# Patient Record
Sex: Male | Born: 1980
Health system: Southern US, Community
[De-identification: ages and names within clinical notes are randomized; demographics above are authoritative.]

## PROBLEM LIST (undated history)

## (undated) ENCOUNTER — Emergency Department (HOSPITAL_COMMUNITY): Admission: EM | Payer: Self-pay

## (undated) DIAGNOSIS — F209 Schizophrenia, unspecified: Secondary | ICD-10-CM

## (undated) HISTORY — PX: APPENDECTOMY: SHX54

---
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
## 2002-02-03 ENCOUNTER — Emergency Department (HOSPITAL_COMMUNITY): Admission: EM | Admit: 2002-02-03 | Discharge: 2002-02-04 | Payer: Self-pay | Admitting: Emergency Medicine

## 2002-12-17 ENCOUNTER — Emergency Department (HOSPITAL_COMMUNITY): Admission: EM | Admit: 2002-12-17 | Discharge: 2002-12-17 | Payer: Self-pay | Admitting: Emergency Medicine

## 2004-01-19 ENCOUNTER — Emergency Department (HOSPITAL_COMMUNITY): Admission: EM | Admit: 2004-01-19 | Discharge: 2004-01-19 | Payer: Self-pay | Admitting: Family Medicine

## 2004-01-25 ENCOUNTER — Emergency Department (HOSPITAL_COMMUNITY): Admission: EM | Admit: 2004-01-25 | Discharge: 2004-01-26 | Payer: Self-pay | Admitting: Emergency Medicine

## 2006-11-03 ENCOUNTER — Inpatient Hospital Stay (HOSPITAL_COMMUNITY): Admission: EM | Admit: 2006-11-03 | Discharge: 2006-11-04 | Payer: Self-pay | Admitting: Emergency Medicine

## 2006-11-03 ENCOUNTER — Encounter (INDEPENDENT_AMBULATORY_CARE_PROVIDER_SITE_OTHER): Payer: Self-pay | Admitting: Specialist

## 2010-01-30 ENCOUNTER — Emergency Department (HOSPITAL_BASED_OUTPATIENT_CLINIC_OR_DEPARTMENT_OTHER): Admission: EM | Admit: 2010-01-30 | Discharge: 2010-01-30 | Payer: Self-pay | Admitting: Emergency Medicine

## 2010-11-22 LAB — BASIC METABOLIC PANEL
BUN: 7 mg/dL (ref 6–23)
Calcium: 9.6 mg/dL (ref 8.4–10.5)
Chloride: 105 mEq/L (ref 96–112)
GFR calc Af Amer: >60 mL/min (ref >60)
Potassium: 4.4 mEq/L (ref 3.5–5.1)
Sodium: 145 mEq/L (ref 135–145)

## 2010-11-22 LAB — URINALYSIS, ROUTINE W REFLEX MICROSCOPIC
Bilirubin Urine: NEGATIVE
Glucose, UA: NEGATIVE mg/dL
Ketones, ur: NEGATIVE mg/dL
Nitrite: NEGATIVE
Specific Gravity, Urine: 1.01 (ref 1.005–1.030)
pH: 6.5 (ref 5.0–8.0)

## 2010-11-22 LAB — DIFFERENTIAL
Basophils Absolute: 0.2 10*3/uL — ABNORMAL HIGH (ref 0.0–0.1)
Eosinophils Absolute: 0.4 10*3/uL (ref 0.0–0.7)
Lymphocytes Relative: 16 % (ref 12–46)
Lymphs Abs: 1.4 10*3/uL (ref 0.7–4.0)
Monocytes Absolute: 0.5 10*3/uL (ref 0.1–1.0)
Monocytes Relative: 6 % (ref 3–12)
Neutro Abs: 6.6 10*3/uL (ref 1.7–7.7)

## 2010-11-22 LAB — POCT TOXICOLOGY PANEL

## 2010-11-22 LAB — CBC: RBC: 5.54 MIL/uL (ref 4.22–5.81)

## 2011-01-21 NOTE — Op Note (Signed)
William Romero, William Romero               ACCOUNT NO.:  [id number]   MEDICAL RECORD NO.:  [id number]          PATIENT TYPE:  INP   LOCATION:  0098                         FACILITY[facility]  [facility]   PHYSICIAN:  Velora Heckler, MD      DATE OF BIRTH:  1981-05-24   DATE OF PROCEDURE:  11/03/2006  DATE OF DISCHARGE:                               OPERATIVE REPORT   PREOPERATIVE DIAGNOSIS:  Acute appendicitis.   POSTOPERATIVE DIAGNOSIS:  Acute appendicitis.   PROCEDURE:  Laparoscopic appendectomy.   SURGEON:  Velora Heckler, MD, FACS   ANESTHESIA:  General per Ronelle Nigh, MD   ESTIMATED BLOOD LOSS:  Minimal.   PREPARATION:  Betadine.   COMPLICATIONS:  None.   INDICATIONS:  The patient is a 30 year old white male who presented to  the emergency department with less than 24-hour history of abdominal  pain localized to the right lower quadrant; this was associated with  chills, nausea and vomiting.  In the emergency department, laboratory  studies showed an elevated white count of 15,000 with a left shift.  CT  scan of abdomen and pelvis was obtained which showed findings consistent  with acute appendicitis.  General Surgery was called and the patient was  seen and prepared for the operating room.   BODY OF REPORT:  Procedure is done in OR #1 at the [facility].  The patient is brought to the operating room and placed in a  supine position on the operating room table.  Following administration  of general anesthesia, the patient is prepped and draped in usual strict  aseptic fashion.  After ascertaining that an adequate level of  anesthesia had been obtained, an infraumbilical incision is made #15  blade.  Dissection is carried through subcutaneous tissues.  Fascia is  incised in the midline and the peritoneal cavity is entered cautiously.  A 0 Vicryl pursestring suture is placed in the fascia.  An Hasson  cannula is introduced and secured with a pursestring suture.   Abdomen  was insufflated carbon dioxide.  Laparoscope is introduced and the  abdomen explored.  Operative ports are placed in the right upper  quadrant and left lower quadrant.  Cecum is mobilized.  The patient is  placed in Trendelenburg.  Appendix extends over the pelvic brim and is  adherent to the pelvic sidewall.  There is a small amount of fluid in  the pelvis.  There is no sign of perforation.  Base of the appendix is  visible.  A window is made at the base of the appendix.  An Endo-GIA  stapler is then used to transect the base of the appendix at its  junction with the cecal wall.  The mesoappendix is then taken down with  the harmonic scalpel used for hemostasis.  The mesoappendix is  completely divided.  Appendix is placed into an EndoCatch bag and  withdrawn through the left lower quadrant port.  Right lower quadrant is  irrigated copiously with warm saline, as is the pelvis.  Fluid is  evacuated.  Good hemostasis is noted.  No purulence is  noted.  Ports are  removed under direct vision.  Good hemostasis is noted at all port  sites.  Pneumoperitoneum is released.  The 0 Vicryl pursestring suture  is tied securely.  Port sites are anesthetized with local anesthetic.  All wounds are closed with interrupted 4-0 Vicryl subcuticular  sutures.  Wounds are washed and dried and Steri-Strips are applied.  Sterile dressings are applied.  The patient is awakened from anesthesia  and brought to the recovery room in stable condition.  The patient  tolerated the procedure well.      Velora Heckler, MD  Electronically Signed     TMG/MEDQ  D:  11/03/2006  T:  11/03/2006  Job:  [id number]   cc:   Erskine Speed, M.D.  Fax: [phone]   Celene Kras, MD  Fax: [phone]

## 2011-01-21 NOTE — H&P (Signed)
William Romero, William Romero               ACCOUNT NO.:  [id number]   MEDICAL RECORD NO.:  [id number]          PATIENT TYPE:  INP   LOCATION:  0098                         FACILITY[facility]   PHYSICIAN:  Velora Heckler, MD      DATE OF BIRTH:  1980/09/27   DATE OF ADMISSION:  11/02/2006  DATE OF DISCHARGE:                              HISTORY & PHYSICAL   CHIEF COMPLAINT:  Abdominal pain, acute appendicitis.   PRIMARY CARE PHYSICIAN:  Dr. Nila Nephew   HISTORY OF PRESENT ILLNESS:  The patient is 30 year old white male  presents to the emergency department with less than 24-hour history of  abdominal pain.  The patient first noted pain in the midline just below  the umbilicus.  This gradually localized to the right lower quadrant.  It was associated with chills.  He developed nausea and has had emesis  on about six occasions.  The patient presented to the emergency  department on the afternoon of February28.  He was seen and evaluated by  the emergency room physician.  Laboratory studies showed an elevated  white blood cell count of 15.1 and the differential showed 98%  neutrophils.  CT scan abdomen and pelvis was obtained and showed  findings consistent with acute appendicitis.  General surgery was  consulted and the patient was prepared for the operating room.   PAST MEDICAL HISTORY:  History of ADD.   MEDICATIONS:  1. Adderall XR  2. Xanax p.r.n.   ALLERGIES:  None known.   SOCIAL HISTORY:  The patient works and Biomedical engineer.  He has also  worked previously as an Journalist, newspaper.  He is accompanied by both  parents.  He smokes.  He denies drug use.  He drinks alcohol 2-3 times  weekly.   FAMILY HISTORY:  Noncontributory.   REVIEW OF SYSTEMS:  A 15 system review without significant other  finding.   PHYSICAL EXAMINATION:  GENERAL:  A44 year old well-developed, well-  nourished white male in no acute distress.  VITAL SIGNS:  Temperature 98.7, pulse 98, respirations 16, O2 sat  97%  room air.  HEENT: Shows him to be normocephalic, atraumatic.  Sclerae clear.  Conjunctivae are clear.  Pupils equal and reactive.  Dentition good.  Mucous membranes moist.  Voice normal.  NECK:  Palpation of the neck shows no lymphadenopathy.  No thyroid  nodularity.  LUNGS: Clear to auscultation bilaterally without rales, rhonchi or  wheeze.  CARDIAC:  Exam shows regular rate and rhythm without murmur.  Peripheral  pulses are full.  ABDOMEN:  Soft without distension.  On auscultation there are rare bowel  sounds.  There is tenderness to percussion and palpation in the right  lower quadrant.  There is voluntary guarding.  Rebound is not tested.  EXTREMITIES:  Nontender without edema.  NEUROLOGICAL:  The patient is alert and oriented without focal deficit.   LABORATORY STUDIES:  White count 15.1, hemoglobin 15.9, hematocrit  45.4%, platelet count 280,000.  Differential shows 90% neutrophils 4%  lymphocytes.  Electrolytes are normal.  Liver function tests are normal.   RADIOGRAPHIC STUDIES:  CT scan  abdomen and pelvis demonstrated findings  consistent with acute appendicitis.   IMPRESSION:  Acute appendicitis.   PLAN:  Antibiotics were initiated in the emergency department.  The  patient was seen and evaluated in the emergency department and surgery  was discussed with the patient and with his parents.  The patient will  be prepared taken directly to the operating room for laparoscopic  appendectomy.  I explained to them there is approximately a 90% chance  of success with the scope at approximately 10% chance of conversion to  open surgery.  We discussed the postoperative hospitalization to be  expected and his recovery at home.  They understand and wish to proceed.  Velora Heckler, MD  Electronically Signed     TMG/MEDQ  D:  11/03/2006  T:  11/03/2006  Job:  [id number]   cc:   Celene Kras, MD  Fax: [phone]   Erskine Speed, M.D.  Fax: [phone]

## 2013-03-16 LAB — CBC
MCHC: 34.4 g/dL (ref 32.0–36.0)
MCV: 87 fL (ref 80–100)
Platelet: 345 10*3/uL (ref 150–440)
RDW: 14 % (ref 11.5–14.5)

## 2013-03-17 LAB — COMPREHENSIVE METABOLIC PANEL
Bilirubin,Total: 0.7 mg/dL (ref 0.2–1.0)
Chloride: 106 mmol/L (ref 98–107)
Creatinine: 2.12 mg/dL — ABNORMAL HIGH (ref 0.60–1.30)
EGFR (Non-African Amer.): 40 — ABNORMAL LOW
Glucose: 204 mg/dL — ABNORMAL HIGH (ref 65–99)
SGOT(AST): 23 U/L (ref 15–37)
Sodium: 139 mmol/L (ref 136–145)
Total Protein: 8.7 g/dL — ABNORMAL HIGH (ref 6.4–8.2)

## 2013-03-17 LAB — URINALYSIS, COMPLETE
Blood: NEGATIVE
Specific Gravity: 1.028 (ref 1.003–1.030)
Squamous Epithelial: NONE SEEN

## 2013-03-17 LAB — DRUG SCREEN, URINE
Barbiturates, Ur Screen: NEGATIVE (ref ?–200)
Benzodiazepine, Ur Scrn: NEGATIVE (ref ?–200)
MDMA (Ecstasy)Ur Screen: POSITIVE (ref ?–500)
Methadone, Ur Screen: NEGATIVE (ref ?–300)
Opiate, Ur Screen: NEGATIVE (ref ?–300)
Phencyclidine (PCP) Ur S: NEGATIVE (ref ?–25)

## 2013-03-17 LAB — ETHANOL
Ethanol %: <0.003 % (ref 0.000–0.080)
Ethanol: <3 mg/dL

## 2013-03-18 LAB — CBC WITH DIFFERENTIAL/PLATELET
Basophil %: 0.8 %
Eosinophil #: 0.3 10*3/uL (ref 0.0–0.7)
Lymphocyte #: 2.2 10*3/uL (ref 1.0–3.6)
MCH: 30.2 pg (ref 26.0–34.0)
Monocyte #: 1 x10 3/mm (ref 0.2–1.0)
Monocyte %: 7.1 %
Neutrophil #: 10 10*3/uL — ABNORMAL HIGH (ref 1.4–6.5)
Neutrophil %: 73.8 %
Platelet: 223 10*3/uL (ref 150–440)

## 2013-03-18 LAB — BASIC METABOLIC PANEL
Anion Gap: 5 — ABNORMAL LOW (ref 7–16)
BUN: 9 mg/dL (ref 7–18)
Calcium, Total: 8.4 mg/dL — ABNORMAL LOW (ref 8.5–10.1)
Chloride: 109 mmol/L — ABNORMAL HIGH (ref 98–107)
EGFR (Non-African Amer.): >60
Potassium: 3.4 mmol/L — ABNORMAL LOW (ref 3.5–5.1)

## 2013-03-19 ENCOUNTER — Inpatient Hospital Stay: Payer: Self-pay | Admitting: Psychiatry

## 2013-03-19 LAB — GC/CHLAMYDIA PROBE AMP

## 2013-03-20 LAB — BEHAVIORAL MEDICINE 1 PANEL
Albumin: 3.1 g/dL — ABNORMAL LOW (ref 3.4–5.0)
Alkaline Phosphatase: 78 U/L (ref 50–136)
Anion Gap: 6 — ABNORMAL LOW (ref 7–16)
BUN: 8 mg/dL (ref 7–18)
Basophil #: 0.1 10*3/uL (ref 0.0–0.1)
Basophil %: 0.9 %
Bilirubin,Total: 0.3 mg/dL (ref 0.2–1.0)
Calcium, Total: 8.5 mg/dL (ref 8.5–10.1)
Chloride: 110 mmol/L — ABNORMAL HIGH (ref 98–107)
Co2: 27 mmol/L (ref 21–32)
Creatinine: 0.9 mg/dL (ref 0.60–1.30)
EGFR (African American): >60
EGFR (Non-African Amer.): >60
Eosinophil #: 0.3 10*3/uL (ref 0.0–0.7)
Eosinophil %: 3.7 %
Glucose: 92 mg/dL (ref 65–99)
HCT: 39.4 % — ABNORMAL LOW (ref 40.0–52.0)
HGB: 13.8 g/dL (ref 13.0–18.0)
Lymphocyte #: 1.7 10*3/uL (ref 1.0–3.6)
Lymphocyte %: 21.9 %
MCH: 30.3 pg (ref 26.0–34.0)
MCHC: 35 g/dL (ref 32.0–36.0)
MCV: 87 fL (ref 80–100)
Monocyte #: 0.4 x10 3/mm (ref 0.2–1.0)
Monocyte %: 5 %
Neutrophil #: 5.2 10*3/uL (ref 1.4–6.5)
Neutrophil %: 68.5 %
Osmolality: 283 (ref 275–301)
Platelet: 243 10*3/uL (ref 150–440)
Potassium: 3.6 mmol/L (ref 3.5–5.1)
RBC: 4.55 10*6/uL (ref 4.40–5.90)
RDW: 13.7 % (ref 11.5–14.5)
SGOT(AST): 25 U/L (ref 15–37)
SGPT (ALT): 24 U/L (ref 12–78)
Sodium: 143 mmol/L (ref 136–145)
Thyroid Stimulating Horm: 0.841 u[IU]/mL
Total Protein: 6.3 g/dL — ABNORMAL LOW (ref 6.4–8.2)
WBC: 7.6 10*3/uL (ref 3.8–10.6)

## 2013-07-29 ENCOUNTER — Ambulatory Visit: Payer: Self-pay

## 2013-07-29 ENCOUNTER — Other Ambulatory Visit: Payer: Self-pay | Admitting: Occupational Medicine

## 2013-07-29 DIAGNOSIS — R52 Pain, unspecified: Secondary | ICD-10-CM

## 2013-09-25 ENCOUNTER — Emergency Department (HOSPITAL_COMMUNITY)
Admission: EM | Admit: 2013-09-25 | Discharge: 2013-09-25 | Disposition: A | Discharge status: Home / Self Care | Payer: Self-pay | Attending: Emergency Medicine | Admitting: Emergency Medicine

## 2013-09-25 ENCOUNTER — Encounter (HOSPITAL_COMMUNITY): Payer: Self-pay | Admitting: Emergency Medicine

## 2013-09-25 DIAGNOSIS — Z79899 Other long term (current) drug therapy: Secondary | ICD-10-CM | POA: Insufficient documentation

## 2013-09-25 DIAGNOSIS — R5381 Other malaise: Secondary | ICD-10-CM | POA: Insufficient documentation

## 2013-09-25 DIAGNOSIS — F17200 Nicotine dependence, unspecified, uncomplicated: Secondary | ICD-10-CM | POA: Insufficient documentation

## 2013-09-25 DIAGNOSIS — R5383 Other fatigue: Secondary | ICD-10-CM

## 2013-09-25 DIAGNOSIS — Z9089 Acquired absence of other organs: Secondary | ICD-10-CM | POA: Insufficient documentation

## 2013-09-25 DIAGNOSIS — K5900 Constipation, unspecified: Secondary | ICD-10-CM | POA: Insufficient documentation

## 2013-09-25 LAB — POCT I-STAT, CHEM 8
BUN: 4 mg/dL — AB (ref 6–23)
CHLORIDE: 101 meq/L (ref 96–112)
CREATININE: 0.9 mg/dL (ref 0.50–1.35)
Calcium, Ion: 1.23 mmol/L (ref 1.12–1.23)
Glucose, Bld: 105 mg/dL — ABNORMAL HIGH (ref 70–99)
HEMATOCRIT: 48 % (ref 39.0–52.0)
HEMOGLOBIN: 16.3 g/dL (ref 13.0–17.0)
Potassium: 3.9 mEq/L (ref 3.7–5.3)
SODIUM: 142 meq/L (ref 137–147)
TCO2: 27 mmol/L (ref 0–100)

## 2013-09-25 LAB — CBC WITH DIFFERENTIAL/PLATELET
BASOS PCT: 2 % — AB (ref 0–1)
Basophils Absolute: 0.1 10*3/uL (ref 0.0–0.1)
EOS ABS: 0.3 10*3/uL (ref 0.0–0.7)
Eosinophils Relative: 7 % — ABNORMAL HIGH (ref 0–5)
HCT: 44.4 % (ref 39.0–52.0)
HEMOGLOBIN: 15.4 g/dL (ref 13.0–17.0)
LYMPHS PCT: 32 % (ref 12–46)
Lymphs Abs: 1.4 10*3/uL (ref 0.7–4.0)
MCH: 31 pg (ref 26.0–34.0)
MCHC: 34.7 g/dL (ref 30.0–36.0)
MCV: 89.3 fL (ref 78.0–100.0)
MONOS PCT: 16 % — AB (ref 3–12)
Monocytes Absolute: 0.7 10*3/uL (ref 0.1–1.0)
NEUTROS ABS: 2 10*3/uL (ref 1.7–7.7)
NEUTROS PCT: 44 % (ref 43–77)
PLATELETS: 271 10*3/uL (ref 150–400)
RBC: 4.97 MIL/uL (ref 4.22–5.81)
RDW: 13.3 % (ref 11.5–15.5)
WBC: 4.5 10*3/uL (ref 4.0–10.5)

## 2013-09-25 NOTE — ED Notes (Addendum)
Pt states that he feels tired.  States that he sleeps ok but when he wakes up he does not feel rested.  Also states that he eats a lot but he doesn't poop a lot.  States his BMs are soft.  States that he is worried that he is not pooping every day.  Poops 3 times a week.  Denies hard stools.  These problems have been going on longer than 1 month.

## 2013-09-25 NOTE — ED Notes (Signed)
Pt escorted to discharge window. Pt verbalized understanding discharge instructions. In no acute distress.  

## 2013-09-25 NOTE — Discharge Instructions (Signed)
Follow up with a doctor from the resource guide for further evaluation of your symptoms. Refer to attached documents for more information.    Emergency Department Resource Guide 1) Find a Doctor and Pay Out of Pocket Although you won't have to find out who is covered by your insurance plan, it is a good idea to ask around and get recommendations. You will then need to call the office and see if the doctor you have chosen will accept you as a new patient and what types of options they offer for patients who are self-pay. Some doctors offer discounts or will set up payment plans for their patients who do not have insurance, but you will need to ask so you aren't surprised when you get to your appointment.  2) Contact Your Local Health Department Not all health departments have doctors that can see patients for sick visits, but many do, so it is worth a call to see if yours does. If you don't know where your local health department is, you can check in your phone book. The CDC also has a tool to help you locate your state's health department, and many state websites also have listings of all of their local health departments.  3) Find a Walk-in Clinic If your illness is not likely to be very severe or complicated, you may want to try a walk in clinic. These are popping up all over the country in pharmacies, drugstores, and shopping centers. They're usually staffed by nurse practitioners or physician assistants that have been trained to treat common illnesses and complaints. They're usually fairly quick and inexpensive. However, if you have serious medical issues or chronic medical problems, these are probably not your best option.  No Primary Care Doctor: - Call Health Connect at  [phone] - they can help you locate a primary care doctor that  accepts your insurance, provides certain services, etc. - Physician Referral Service- [phone]  Chronic Pain Problems: Organization          Address  Phone   Notes  [facility]  [phone]) [phone] Patients need to be referred by their primary care doctor.   Medication Assistance: Organization         Address  Phone   Notes  [facility] Medication [facility] Anssistance Program [address] [place], [place] [address] [phone] --Must be a resident of [place] -- Must have NO insurance coverage whatsoever (no Medicaid/ Medicare, etc.) -- The pt. MUST have a primary care doctor that directs their care regularly and follows them in the community   MedAssist  [phone]   [employer]  [phone]    Agencies that provide inexpensive medical care: Organization         Address  Phone   Notes  [facility]  [phone]) [phone]   [facility]    [phone]   [facility] [address] [place], [place] [address] [phone]   [facility] [place] [address], [place] [phone](336) [phone]   [facility]    [phone](336) [phone]   [facility]    [phone]   [facility]  [address], [place] Phone:  [phone]) [phone], Fax:  [phone] Hours of Operation:  9 am - 6 pm, M-F.  Also accepts Medicaid/Medicare and self-pay.  [facility]  [address], [place] Phone: [phone], Fax: [phone]. Hours of Operation:  8:30 am - 5:30 pm, M-F.  Also accepts Medicaid and self-pay.  [facility] [place] [address], [place] Phone: [phone]   [place], [place], [place] [phone], Ext. [phone] Mondays & Thursdays: 7-9 AM.  First 15 patients are seen on a first come, first serve basis.    [place] Providers:  Organization         Address  Phone   Notes  [facility] [address], [place] [phone] Also accepts self-pay patients.  [facility] [address], [place]  [phone]   [place], [address], [place] [phone]   [facility] [address] [place] [phone]   Lucianne Lei [address], [place]   [phone] Only accepts [place] Access [place] patients after they have their name applied to their card.   Self-Pay (no insurance) in [facility]:  Organization         Address  Phone   Notes  Sickle Cell Patients, [facility] [place] [phone]   [facility] [place] [phone]   [facility] [place]  [place], [place], [place] [phone]   [facility]/Dr. Osei-Bonsu  [address] [place] or [address], [place] [phone] Phone number for both [place] and [place] locations is the same.  [facility] [address] [place] [phone]   [facility] [address], [place] or [address] [phone]   [facility] [address] [place] [phone], phone; [phone], fax Sees patients 1st and 3rd Saturday of every month.  Must not qualify for public or private insurance (i.e. Medicaid, Medicare, [place] Health Choice, Veterans' Benefits)  Household income should be no more than 200% of the poverty level The clinic cannot treat you if you are pregnant or think you are pregnant  Sexually transmitted diseases are not treated at the clinic.    Dental Care: Organization         Address  Phone  Notes  [facility] [place] [phone] Accepts children up to age 23 who are enrolled in [place] or [place]; pregnant women with a Medicaid card; and  children who have applied for Medicaid or [place] Health Choice, but were declined, whose parents can pay a reduced fee at time of service.  [facility] of [facility]  [address], [place] [phone] Accepts children up to age 22 who are enrolled in [place] or [place]; pregnant women with a Medicaid card; and children who have applied for Medicaid or [place] Health Choice, but were declined, whose parents can pay a reduced fee at time of service.  [facility]  [place] [phone] Patients are seen by appointment only. Walk-ins are not accepted. [place] will see patients 70 years of age and older. Monday - Tuesday (8am-5pm) Most Wednesdays (8:30-5pm) $30 per visit, cash only  [facility]  [address], [employer] [place] [phone] Patients are seen by appointment only. Walk-ins are not accepted. [facility] will see patients 18 years of age and older. °One Wednesday Evening (Monthly: Volunteer Based).  $30 per visit, cash only  °[facility]  [phone] for adults; Children under age 4, call Graduate Pediatric Dentistry at [phone]. Children aged 4-14, please call [phone] to request a pediatric application. ° Dental services are provided in all areas of dental care including fillings, crowns and bridges, complete and partial dentures, implants, gum treatment, root canals, and extractions. Preventive care is also provided. Treatment is provided to both adults and children. °Patients are selected via a lottery and there is often a waiting list. °  °[facility] [address], °[place] ° ([phone] www.drcivils.com °  °[facility] [address], [place], [place] [phone], Ext. [phone] Second and Fourth Thursday of each month, opens at 6:30 AM; Clinic ends at 9 AM.  Patients are seen on a first-come first-served  basis, and a limited number are seen during each clinic.  ° °[facility] ° [address], [place], [place] [phone]   Eligibility Requirements °You must have lived in [place], [place], or [place] counties for at least the last three months. °  You cannot be eligible for state or federal sponsored healthcare insurance, including Veterans Administration, Medicaid, or Medicare. °  You generally cannot be eligible for healthcare insurance through your employer.  °  How to apply: °Eligibility screenings are held every Tuesday and Wednesday afternoon from 1:00 pm until 4:00 pm. You do not need an appointment for the interview!  °[facility] [address], [place], [place] [phone]   °[facility]  [phone]   °[place]  [phone]   °[facility]  336-570-6415   ° °Behavioral Health Resources in the Community: °Intensive Outpatient Programs °Organization         Address  Phone  Notes  °[facility] [address], [place], [place] [phone]   °[facility] [address], [place], [place] [phone]   °[facility] [address], [place], [place] ° 336-882-2125   °[facility] [address],  °[place], [place] 1-800-853-5163 or 336-641-4981   °Substance Abuse Resources °Organization         Address  Phone  Notes  °[facility]  336-882-2125   °[facility]  336-784-9470   °[facility]  336-285-9073   °[facility]  336-845-3988   °Residential & Outpatient Substance Abuse Program  1-800-659-3381   °Psychological Services °Organization         Address  Phone  Notes  °[facility]  336- 832-9600   °[facility]  336- 378-7881   °[facility] [address], [place] 1-800-853-5163 or 336-641-4981   ° °Mobile Crisis Teams °Organization          Address  Phone  Notes  °[facility], Mobile Crisis Care Unit  1-877-626-1772   °Assertive °Psychotherapeutic Services ° [address] [place], [place] 336-834-9664   °Sharon DeEsch [address] °[place] 336-554-5454   ° °Self-Help/Support Groups °Organization         Address  Phone             Notes  °[facility] [place] - variety of support groups    336- H3156881 Call for more information  Narcotics Anonymous (NA), [facility] [address], [employer] [place]  2 meetings at this location   Residential Facilities manager         Address  Phone  Notes  [facility] [place],    [place]  1-681-042-6357   [facility]  [address], [place] [id number], [place], [place]   [place], [place] [phone] Admissions: 8am-3pm M-F  Incentives Substance [place] [address]    [employer], [place] [phone]   [facility] [address] [place], [place], [place]   [facility] [address],  [place], [place]   [facility] - Intensive Outpatient [address], [place], [place]   [facility] ([place].,  [place], [place] 1-(336)590-7905 or [phone]   [facility] ([facility]) [address], [place], [place] Accepts Medicaid  [facility] [address]  [place] 1-(661)341-8339 Substance Abuse/Addiction Treatment   [facility] Organization         Address  Phone  Notes  [facility]  512-005-4881   Domenic Schwab, PhD [address] Arlis [place], [place]   [phone] or [phone]   [place], [place] [phone]   [facility] [address], [place], [place] [phone] Insurance/Medicaid/sponsorship  through [facility] [address]                                    [place], [place] [phone] [place] [address], [place] [phone]    Dr. Adele Schilder  [phone]   [facility] Dept. 1) [address] [place] 2) [place] 3)  [place], [place] [phone]  [phone]   [place] [phone] or [phone] (After Hours)

## 2013-09-25 NOTE — ED Provider Notes (Signed)
CSN: [id number]     Arrival date & time 09/25/13  1113 History   First MD Initiated Contact with Patient 09/25/13 1127     Chief Complaint  Patient presents with  . Fatigue   (Consider location/radiation/quality/duration/timing/severity/associated sxs/prior Treatment) HPI Comments: Patient is a 33 year old male with no significant past medical history who presents with fatigue for the past month. Symptoms started gradually and progressively worsened since the onset. Patient denies any other specific symptoms or changes in habits. He reports sleeping the same amount as he was previously but does not feel rested when he wakes up in the morning. Patient reports some associated constipation, as he has a bowel movement about 3 times per week. This has changed from a daily bowel movement about a month ago. Patient has no PCP bur does see Dr. Carroll Sage, Psychiatrist, who prescribes Vyvanse. Patient has not tried anything for symptoms improvement. Patient denies any changes in mood or loss of interest in activities.    History reviewed. No pertinent past medical history. Past Surgical History  Procedure Laterality Date  . Appendectomy     History reviewed. No pertinent family history. History  Substance Use Topics  . Smoking status: Current Every Day Smoker -- 0.50 packs/day    Types: Cigarettes  . Smokeless tobacco: Not on file  . Alcohol Use: No    Review of Systems  Constitutional: Positive for fatigue. Negative for fever and chills.  HENT: Negative for trouble swallowing.   Eyes: Negative for visual disturbance.  Respiratory: Negative for shortness of breath.   Cardiovascular: Negative for chest pain and palpitations.  Gastrointestinal: Positive for constipation. Negative for nausea, vomiting, abdominal pain and diarrhea.  Genitourinary: Negative for dysuria and difficulty urinating.  Musculoskeletal: Negative for arthralgias and neck pain.  Skin: Negative for color change.   Neurological: Negative for dizziness and weakness.  Psychiatric/Behavioral: Negative for dysphoric mood.    Allergies  Review of patient's allergies indicates no known allergies.  Home Medications   Current Outpatient Rx  Name  Route  Sig  Dispense  Refill  . lisdexamfetamine (VYVANSE) 30 MG capsule   Oral   Take 30 mg by mouth daily.          BP 125/78  Pulse 91  Temp(Src) 98.2 F (36.8 C) (Oral)  Resp 16  SpO2 100% Physical Exam  Nursing note and vitals reviewed. Constitutional: He is oriented to person, place, and time. He appears well-developed and well-nourished. No distress.  HENT:  Head: Normocephalic and atraumatic.  Mouth/Throat: Oropharynx is clear and moist. No oropharyngeal exudate.  Eyes: Conjunctivae and EOM are normal.  Neck: Normal range of motion.  Cardiovascular: Normal rate and regular rhythm.  Exam reveals no gallop and no friction rub.   No murmur heard. Pulmonary/Chest: Effort normal and breath sounds normal. He has no wheezes. He has no rales. He exhibits no tenderness.  Abdominal: Soft. He exhibits no distension. There is no tenderness. There is no rebound.  Musculoskeletal: Normal range of motion.  Neurological: He is alert and oriented to person, place, and time. Coordination normal.  Speech is goal-oriented. Moves limbs without ataxia.   Skin: Skin is warm and dry.  Psychiatric: His behavior is normal.  Flat affect    ED Course  Procedures (including critical care time) Labs Review Labs Reviewed  CBC WITH DIFFERENTIAL - Abnormal; Notable for the following:    Monocytes Relative 16 (*)    Eosinophils Relative 7 (*)    Basophils Relative  2 (*)    All other components within normal limits  POCT I-STAT, CHEM 8 - Abnormal; Notable for the following:    BUN 4 (*)    Glucose, Bld 105 (*)    All other components within normal limits  BASIC METABOLIC PANEL   Imaging Review No results found.  EKG Interpretation   None       MDM    1. Fatigue     12:45 PM Labs pending. Vitals stable and patient afebrile.   1:39 PM Labs unremarkable for acute changes. Patient will have information and resource guide for follow up and further evaluation with a PCP. Vitals stable and patient afebrile.     Emilia BeckKaitlyn Malli Falotico, PA-C 09/25/13 1339

## 2013-09-26 NOTE — ED Provider Notes (Signed)
Medical screening examination/treatment/procedure(s) were performed by non-physician practitioner and as supervising physician I was immediately available for consultation/collaboration.  EKG Interpretation   None         Marquita Lias M Bethany Hirt, DO 09/26/13 0854 

## 2014-12-26 NOTE — H&P (Signed)
PATIENT NAME:  William Romero, William Romero MR#:  [id number] DATE OF BIRTH:  [birth year]  DATE OF ADMISSION:  03/19/2013  IDENTIFYING INFORMATION AND CHIEF COMPLAINT:  A 34 year old man who was brought to the Emergency Room by law enforcement after being found wandering in traffic.   CHIEF COMPLAINT:  "I was walking on the highway."   HISTORY OF PRESENT ILLNESS:  Information obtained from the patient and the chart. The patient is not forthcoming and not a good historian currently. He admits that he had run out of gas and was walking on the highway. He goes on to admit that he was actually walking out in the middle of traffic. He states this as though it were a perfectly reasonable thing and cannot give any explanation for why he was specifically doing this. Evidently, law enforcement picked him up and brought him to the hospital at that point. The patient is not able to give an account of events leading up to this. He will say that he is from [place] currently, but his family lives in [place] and he was on his way driving back to [place] when this happened. He denies any mood symptoms. Denies any changes in sleep patterns. Denies any substance abuse and he denied symptoms of psychosis, including hallucinations or delusions. The patient evidently had been prescribed Adderall in the past and did have a positive drug screen for amphetamines but he states that he was taking it all completely appropriately. When he was first brought into the Emergency Room, he was agitated and aggressive and actually required restraint and forced injections while in the Emergency Room. He has calmed down significantly since then, but still is uncooperative with the evaluation.   PAST PSYCHIATRIC HISTORY:  He denies prior psychiatric hospitalizations. He will only say that he has been on Adderall in the past. He denies any history of suicidal behavior or aggression. He says that he does not know of any psychiatric diagnosis that he  has been given in the past. He cannot even give me a clear description of why he is taking the Adderall. I note in the nursing note from the Emergency Room, however, that it stated that he had been in a psychiatric hospital previously. It also appears that while in the Emergency Room he had told someone that he took "bad drugs" and also that he had been driving across the state because he "heard things and needed to check for himself" but could not elaborate any more than that. Evidently, although there was some information obtained from possibly family members to suggest he had had a diagnosis of bipolar disorder in the past.   SUBSTANCE ABUSE HISTORY:  He is currently prescribed Adderall. I have confirmed that through the database. We do not know whether he was using it correctly but do not have any evidence that he was not. His drug screen is also positive for MDMA and he is not taking any medicines that normally would cross react with that but suggests that he could be using other drugs, although he denies it.   PAST MEDICAL HISTORY: The patient denies any significant ongoing medical problem. In the Emergency Room he was noted to have urine with multiple white cells in it which looked suspicious for infection. Screen for Chlamydia and gonorrhea was negative but he is being treated with ciprofloxacin.   FAMILY HISTORY:  Unknown.   SOCIAL HISTORY:  The patient recently has been living in [place]. He tells me that he is not working.  He will not give me any other background about work history or school history. His family evidently lives in [place]. He says he has no children and is not married.   REVIEW OF SYSTEMS: The patient is generally negative in his responses to me. Denies physical symptoms. Denies pain, nausea, fatigue, shortness of breath, any GI or cardiac symptoms. Denies feeling depressed. Denies hallucinations. Denies suicidal ideation.   CURRENT MEDICATIONS:  All we know of is  Adderall 30 mg a day.   ALLERGIES:  No known drug allergies.   MENTAL STATUS EXAMINATION:  Disheveled man who looks his stated age, only passively cooperative at the interview. Good eye contact but psychomotor activity limited. Affect flat and withdrawn. Mood stated as fine. Thoughts appear to be marked by thought blocking and possibly concealment. Quite slow. Did not make any grossly bizarre statements. Denied hallucinations. Denied suicidal or homicidal ideation. Judgment and insight poor. The patient is alert and oriented to the place and date but cannot describe the situation that brought him here. He appears to probably be of average intelligence.   PHYSICAL EXAMINATION: SKIN: No acute skin lesions.  HEENT: Pupils equal and reactive. Face symmetric. Normal oral mucosa. BACK AND NECK:  Nontender.  MUSCULOSKELETAL: Full range of motion at all extremities. Strength and reflexes normal throughout. Gait normal.  LUNGS: Clear with no wheezes.  HEART: Regular rate and rhythm.  ABDOMEN: Soft, nontender, normal bowel sounds.  VITAL SIGNS: Temperature 98.1, pulse 63, respirations 20, blood pressure 115/78.   LABORATORY RESULTS: Admission labs showed a drug screen positive for MDMA and amphetamines. TSH normal. Alcohol not detected. Chemistry showed an elevated creatinine 2.12, elevated glucose 204, potassium low 3.2, total protein elevated 8.7. CBC showed a white count of 26.4, hematocrit and platelet count normal. He had urine which appeared to possibly indicate infection. The urine culture showed no growth and so do the blood cultures. Salicylates elevated 4.2. \ EKG showed a tachycardia at 150 when he first came in.  Head CT normal. Chest x-ray normal. Follow chemistry showed that his creatinine came right down in a day or so to 1.01. Glucose came down. Followup chemistries continue to show normalization. Also the white count has come all the way down to 7.6 and everything else looks normal in just a  couple days.   ASSESSMENT: A 34 year old gentleman presented wandering out in the highway, confused, agitated, probably delirious. Not forthcoming with any history. After a couple days of recovery, he physically appears much better, but is still mentally impaired, not forthcoming, appears paranoid, uncooperative. Differential diagnosis would include schizophrenia, bipolar disorder, substance-induced delirium and psychosis, depression or medical illness. Right now medical illness is starting to look less likely, probably has a primary psychiatric problem. Unclear about extensive substance abuse. Needs hospitalization for safety.   TREATMENT PLAN: Admitted now to psychiatry. He is currently on some Seroquel. We are trying to get more information, gather more data about his situation. He will be included in groups and activities as appropriate. Workup will continue.   DIAGNOSIS, PRINCIPAL AND PRIMARY:   AXIS I:  Psychosis, not otherwise specified.   SECONDARY DIAGNOSES: AXIS I:  Deferred.   AXIS II:  Deferred.   AXIS III:  Probably had some renal insufficiency. I would guess most likely from dehydration, maybe from heatstroke when he first came in, which might have also accounted for some of his other lab abnormalities but that is looking better.  No other medical condition known.   AXIS IV:  Evidently, severe given his current disorganized situation.   AXIS V:  Functioning at time of evaluation is 30.     ____________________________ Audery Amel, MD jtc:ce D: 03/20/2013 10:13:39 ET T: 03/20/2013 10:31:57 ET JOB#: [id number]  cc: Audery Amel, MD, <Dictator> Audery Amel MD ELECTRONICALLY SIGNED 03/20/2013 10:58

## 2014-12-26 NOTE — Consult Note (Signed)
Brief Consult Note: Diagnosis: Bipolar Do, ADD By HX.   Patient was seen by consultant.   Recommend further assessment or treatment.   Orders entered.   Comments: Pt seen in ED. He was admitted due to paranoia, agitation and non complaince with treatment. he remains somewhat psychotic and was unable to provide much history. he stated that [place] was [place], when he ran out out of gas and started walking. He stated that he was Dx with ADD and was getting medication for the same. he denied any treatment. Stated that he did not like the doctor in [place] and did not take the Celexa for a long period of time.  Collateral info was obtained from his mother who mentioned that pt was treated for Bipolar DX in the past. He was getting Depakote and he remained in a very disorganized apartment and he was not taking his medications as prescribed. he continues to be manic and was not responding to the medications. [place] for worsening of his symptoms.   Plan: Pt will be admitted for stabilizations and safety.  Start Seroquel 25mg  po BID Continue Seroquel 100mg  po qhs.  Will continue to monitor.  Electronic Signatures: Rhunette CroftFaheem, Mandy Fitzwater S (MD)  (Signed 15-Jul-14 12:52)  Authored: Brief Consult Note   Last Updated: 15-Jul-14 12:52 by Rhunette CroftFaheem, Danyell Awbrey S (MD)

## 2014-12-26 NOTE — Discharge Summary (Signed)
PATIENT NAME:  William Romero, William Romero MR#:  [id number] DATE OF BIRTH:  Jul 27, 1981  DATE OF ADMISSION:  03/19/2013 DATE OF DISCHARGE:  03/29/2013  HOSPITAL COURSE: See dictated history and physical for details of admission. A 34 year old man with a history of several prior hospitalizations for psychotic symptoms who was admitted through the Emergency Room. He had presented walking down the highway disorganized and psychotic. Initially he was agitated and uncooperative but after receiving medication he calmed down. He remained disorganized and paranoid, but was agreeable to starting on medication. After getting collateral history from the family, it became clear that he had a chronic psychotic disorder. He was placed on Invega in hopes of switching him to an [place] shot. He showed significant improvement on 6 mg a day of Invega. He began to participate appropriately in groups on the ward. Did not show any dangerous behavior. Denied suicidal ideation. We met with his family who are educated about his mental illness. They will be able to provide housing for him for at least a day or two while he tries to get back on his feet and get a job in the area. I spoke to his psychiatrist down in [place] as well. At this point, the patient will be following up locally with the [facility].  We have tried to get him some help in applying for disability, as well.   DISCHARGE MEDICATIONS: Kirt Boys  shot 150 mg intramuscular to be given once a month with the first dose due on about July 30 and monthly thereafter.   LABORATORY RESULTS: Admission labs included CBC with an initially elevated white count of 26.4 which came down quickly to a normal level. I suspect that was probably from near heat stroke. Chemistry panel showed glucose elevated initially at 204, creatinine elevated 2.12, potassium low at 3.2. All of those also normalized. Alcohol undetectable. TSH normal at 1.2. Drug screen positive for amphetamines  and MDMA. Urinalysis did not show any growth, but it did appear to be possibly infected and was treated with antibiotics. Had sinus tachycardia, initially. Chest x-ray normal. Blood cultures, no growth. White count and chemistry panel both improved back to normal before discharge.   MENTAL STATUS EXAM AT DISCHARGE: Casually dressed, adequately groomed man who looks his stated age, cooperative with the interview. Good eye contact. Normal psychomotor activity. Speech quiet, decreased in amount. Affect blunted. Mood stated as fine. Thoughts appear a little bit slow and still slightly disorganized, but he is able to carry on a lucid conversation. Denies hallucinations. Denies suicidal or homicidal ideation. Improved insight and judgment. Normal intelligence. Alert and oriented x 4.   DISPOSITION: He will be staying initially with his parents and following up with the [facility] team.    DIAGNOSIS, PRINCIPAL AND PRIMARY:   AXIS I: Schizophrenia, undifferentiated.   SECONDARY DIAGNOSES: AXIS I: No further.   AXIS II: Deferred.   AXIS III: Early rhabdomyolysis, possible heatstroke; all resolved.   AXIS IV: Moderate to severe from the burden of his illness, lack of finances, lack of support.   AXIS V: Functioning at time of discharge 55.   ____________________________ Gonzella Lex, MD jtc:cc D: 04/03/2013 16:37:48 ET T: 04/03/2013 17:35:02 ET JOB#: [id number]  cc: Gonzella Lex, MD, <Dictator>

## 2014-12-26 NOTE — Consult Note (Signed)
PATIENT NAME:  William Romero, William Romero MR#:  [id number] DATE OF BIRTH:  Dec 13, 1980  DATE OF CONSULTATION:  03/18/2013  REFERRING PHYSICIAN:  Dr. Guss Bundehalla  CONSULTING PHYSICIAN:  Izola PriceFrances C. Jaclynn MajorGreason, MD  This is a brief psychiatric consult note.  The patient was referred and seen by Dr. Guss Bundehalla in psychiatric consult on 03/17/2013.   HISTORY OF PRESENT ILLNESS: The patient is 34 years old and was brought in because he was found wandering in traffic.   At that time, he was alert and confused and answered most of the questions back with answers to Dr. Guss Bundehalla. He was very irritable and agitated and upset. He was paranoid and suspicious and thought the people around him were against him and plotting against him.   Today the patient is unchanged. He continues to be paranoid and suspicious.  He gets irritable and agitated exceedingly easily. He tries to leave the ED at times. At one point during the day, he was given an injection of Geodon 20 mg IM at about 1:30 p.m. on 03/19/2003. When I went in to check on him, he wanted another one. He is very distractible with poor eye contact. He is unable to carry on a cogent conversation. He is somewhat anxious and his thoughts are loose. He does not interact well and  he is restless. He appears to be disoriented with minimal insight and poor judgment. We will keep him in the ED.  PLAN:  The patient will stay in the ED for observation and when he is clearer, we will reassess him.   ____________________________ Izola PriceFrances C. Jaclynn MajorGreason, MD fcg:rw D: 03/18/2013 15:56:59 ET T: 03/18/2013 16:05:55 ET JOB#: [id number]  cc: Izola PriceFrances C. Jaclynn MajorGreason, MD, <Dictator> Maryan PulsFRANCES C Jayren Cease MD ELECTRONICALLY SIGNED 03/20/2013 14:36

## 2014-12-26 NOTE — Discharge Summary (Signed)
PATIENT NAME:  William Romero, William Romero MR#:  [id number] DATE OF BIRTH:  [birth year]  DATE OF ADMISSION:  03/19/2013 DATE OF DISCHARGE:  03/29/2013  HOSPITAL COURSE: See dictated history and physical for details of admission. A 34 year old man with a history of several prior hospitalizations for psychotic symptoms who was admitted through the Emergency Room. He had presented walking down the highway disorganized and psychotic. Initially he was agitated and uncooperative but after receiving medication he calmed down. He remained disorganized and paranoid, but was agreeable to starting on medication. After getting collateral history from the family, it became clear that he had a chronic psychotic disorder. He was placed on Invega in hopes of switching him to an [place] shot. He showed significant improvement on 6 mg a day of Invega. He began to participate appropriately in groups on the ward. Did not show any dangerous behavior. Denied suicidal ideation. We met with his family who are educated about his mental illness. They will be able to provide housing for him for at least a day or two while he tries to get back on his feet and get a job in the area. I spoke to his psychiatrist down in [place] as well. At this point, the patient will be following up locally with the [facility] team.  We have tried to get him some help in applying for disability, as well.   DISCHARGE MEDICATIONS: Kirt Boys  shot 150 mg intramuscular to be given once a month with the first dose due on about July 30 and monthly thereafter.   LABORATORY RESULTS: Admission labs included CBC with an initially elevated white count of 26.4 which came down quickly to a normal level. I suspect that was probably from near heat stroke. Chemistry panel showed glucose elevated initially at 204, creatinine elevated 2.12, potassium low at 3.2. All of those also normalized. Alcohol undetectable. TSH normal at 1.2. Drug screen positive for amphetamines  and MDMA. Urinalysis did not show any growth, but it did appear to be possibly infected and was treated with antibiotics. Had sinus tachycardia, initially. Chest x-ray normal. Blood cultures, no growth. White count and chemistry panel both improved back to normal before discharge.   MENTAL STATUS EXAM AT DISCHARGE: Casually dressed, adequately groomed man who looks his stated age, cooperative with the interview. Good eye contact. Normal psychomotor activity. Speech quiet, decreased in amount. Affect blunted. Mood stated as fine. Thoughts appear a little bit slow and still slightly disorganized, but he is able to carry on a lucid conversation. Denies hallucinations. Denies suicidal or homicidal ideation. Improved insight and judgment. Normal intelligence. Alert and oriented x 4.   DISPOSITION: He will be staying initially with his parents and following up with the [facility] team.    DIAGNOSIS, PRINCIPAL AND PRIMARY:   AXIS I: Schizophrenia, undifferentiated.   SECONDARY DIAGNOSES: AXIS I: No further.   AXIS II: Deferred.   AXIS III: Early rhabdomyolysis, possible heatstroke; all resolved.   AXIS IV: Moderate to severe from the burden of his illness, lack of finances, lack of support.   AXIS V: Functioning at time of discharge 55.   ____________________________ Gonzella Lex, MD jtc:cc D: 04/03/2013 16:37:00 ET T: 04/03/2013 17:35:02 ET JOB#: [id number]  cc: Gonzella Lex, MD, <Dictator> Gonzella Lex MD ELECTRONICALLY SIGNED 04/04/2013 15:38

## 2017-10-02 DIAGNOSIS — M2041 Other hammer toe(s) (acquired), right foot: Secondary | ICD-10-CM | POA: Diagnosis not present

## 2017-10-02 DIAGNOSIS — M79671 Pain in right foot: Secondary | ICD-10-CM | POA: Diagnosis not present

## 2017-12-02 ENCOUNTER — Encounter (HOSPITAL_COMMUNITY): Payer: Self-pay | Admitting: Emergency Medicine

## 2017-12-02 ENCOUNTER — Emergency Department (HOSPITAL_COMMUNITY)
Admission: EM | Admit: 2017-12-02 | Discharge: 2017-12-02 | Disposition: A | Discharge status: Home / Self Care | Payer: 59 | Attending: Emergency Medicine | Admitting: Emergency Medicine

## 2017-12-02 ENCOUNTER — Emergency Department (HOSPITAL_COMMUNITY): Payer: 59

## 2017-12-02 DIAGNOSIS — H748X3 Other specified disorders of middle ear and mastoid, bilateral: Secondary | ICD-10-CM | POA: Diagnosis not present

## 2017-12-02 DIAGNOSIS — R410 Disorientation, unspecified: Secondary | ICD-10-CM | POA: Diagnosis not present

## 2017-12-02 DIAGNOSIS — R413 Other amnesia: Secondary | ICD-10-CM | POA: Diagnosis not present

## 2017-12-02 DIAGNOSIS — F17210 Nicotine dependence, cigarettes, uncomplicated: Secondary | ICD-10-CM | POA: Diagnosis not present

## 2017-12-02 DIAGNOSIS — R6889 Other general symptoms and signs: Secondary | ICD-10-CM

## 2017-12-02 HISTORY — DX: Schizophrenia, unspecified: F20.9

## 2017-12-02 LAB — COMPREHENSIVE METABOLIC PANEL
ALK PHOS: 76 U/L (ref 38–126)
ALT: 23 U/L (ref 17–63)
ANION GAP: 9 (ref 5–15)
AST: 26 U/L (ref 15–41)
Albumin: 4.1 g/dL (ref 3.5–5.0)
BUN: 15 mg/dL (ref 6–20)
CALCIUM: 9 mg/dL (ref 8.9–10.3)
CO2: 23 mmol/L (ref 22–32)
Chloride: 109 mmol/L (ref 101–111)
Creatinine, Ser: 0.95 mg/dL (ref 0.61–1.24)
GLUCOSE: 86 mg/dL (ref 65–99)
Potassium: 3.9 mmol/L (ref 3.5–5.1)
Sodium: 141 mmol/L (ref 135–145)
Total Bilirubin: 0.7 mg/dL (ref 0.3–1.2)
Total Protein: 7.3 g/dL (ref 6.5–8.1)

## 2017-12-02 LAB — URINALYSIS, ROUTINE W REFLEX MICROSCOPIC
Bilirubin Urine: NEGATIVE
Glucose, UA: NEGATIVE mg/dL
Hgb urine dipstick: NEGATIVE
Ketones, ur: 5 mg/dL — AB
Leukocytes, UA: NEGATIVE
NITRITE: NEGATIVE
Protein, ur: NEGATIVE mg/dL
SPECIFIC GRAVITY, URINE: 1.023 (ref 1.005–1.030)
pH: 6 (ref 5.0–8.0)

## 2017-12-02 LAB — RAPID URINE DRUG SCREEN, HOSP PERFORMED
Amphetamines: NOT DETECTED
BENZODIAZEPINES: NOT DETECTED
Barbiturates: NOT DETECTED
Cocaine: NOT DETECTED
OPIATES: NOT DETECTED
Tetrahydrocannabinol: NOT DETECTED

## 2017-12-02 LAB — CBC WITH DIFFERENTIAL/PLATELET
BASOS ABS: 0.1 10*3/uL (ref 0.0–0.1)
Basophils Relative: 1 %
EOS PCT: 3 %
Eosinophils Absolute: 0.1 10*3/uL (ref 0.0–0.7)
HCT: 44.1 % (ref 39.0–52.0)
Hemoglobin: 15.1 g/dL (ref 13.0–17.0)
LYMPHS PCT: 29 %
Lymphs Abs: 1.2 10*3/uL (ref 0.7–4.0)
MCH: 30.6 pg (ref 26.0–34.0)
MCHC: 34.2 g/dL (ref 30.0–36.0)
MCV: 89.5 fL (ref 78.0–100.0)
MONO ABS: 0.5 10*3/uL (ref 0.1–1.0)
Monocytes Relative: 11 %
Neutro Abs: 2.3 10*3/uL (ref 1.7–7.7)
Neutrophils Relative %: 56 %
Platelets: 234 10*3/uL (ref 150–400)
RBC: 4.93 MIL/uL (ref 4.22–5.81)
RDW: 13.2 % (ref 11.5–15.5)
WBC: 4.2 10*3/uL (ref 4.0–10.5)

## 2017-12-02 LAB — ACETAMINOPHEN LEVEL: Acetaminophen (Tylenol), Serum: <10 ug/mL — ABNORMAL LOW (ref 10–30)

## 2017-12-02 LAB — ETHANOL

## 2017-12-02 LAB — SALICYLATE LEVEL

## 2017-12-02 NOTE — ED Provider Notes (Signed)
[facility]-EMERGENCY DEPT Provider Note   CSN: [id number] Arrival date & time: 12/02/17  1000     History   Chief Complaint Chief Complaint  Patient presents with  . forgetfulness  . trouble forming words    HPI William Romero is a 37 y.o. male.     Pt was seen at 1230.  Per pt, c/o gradual onset and persistence of multiple intermittent episodes of "forgetfulness" for the past 2+ months. Pt denies any change in his symptoms over the the past 2+ months. Denies any other symptoms. Denies SI/HI/AVH. Denies CP/palpitations, no SOB/cough, no abd pain, no N/V/D, no fevers, no rash, no visual changes, no focal motor weakness, no tingling/numbness in extremities, no ataxia, no slurred speech, no facial droop.      Past Medical History:  Diagnosis Date  . Schizophrenia (HCC)     There are no active problems to display for this patient.   Past Surgical History:  Procedure Laterality Date  . APPENDECTOMY          Home Medications    Prior to Admission medications   Medication Sig Start Date End Date Taking? Authorizing Provider  lisdexamfetamine (VYVANSE) 30 MG capsule Take 30 mg by mouth daily.    [provider]    Family History No family history on file.  Social History Social History   Tobacco Use  . Smoking status: Current Every Day Smoker    Packs/day: 0.50    Types: Cigarettes  . Smokeless tobacco: Never Used  Substance Use Topics  . Alcohol use: Yes  . Drug use: No     Allergies   Patient has no known allergies.   Review of Systems Review of Systems ROS: Statement: All systems negative except as marked or noted in the HPI; Constitutional: Negative for fever and chills. ; ; Eyes: Negative for eye pain, redness and discharge. ; ; ENMT: Negative for ear pain, hoarseness, nasal congestion, sinus pressure and sore throat. ; ; Cardiovascular: Negative for chest pain, palpitations, diaphoresis, dyspnea and peripheral  edema. ; ; Respiratory: Negative for cough, wheezing and stridor. ; ; Gastrointestinal: Negative for nausea, vomiting, diarrhea, abdominal pain, blood in stool, hematemesis, jaundice and rectal bleeding. . ; ; Genitourinary: Negative for dysuria, flank pain and hematuria. ; ; Musculoskeletal: Negative for back pain and neck pain. Negative for swelling and trauma.; ; Skin: Negative for pruritus, rash, abrasions, blisters, bruising and skin lesion.; ; Neuro: +episodes of forgetfulness. Negative for headache, lightheadedness and neck stiffness. Negative for weakness, altered level of consciousness, altered mental status, extremity weakness, paresthesias, involuntary movement, seizure and syncope.;  Psych:  No SI, no SA, no HI, no hallucinations.    Physical Exam Updated Vital Signs BP 123/84   Pulse (!) 57   Temp 97.9 F (36.6 C) (Oral)   Resp 16   SpO2 98%   Physical Exam 1235: Physical examination:  Nursing notes reviewed; Vital signs and O2 SAT reviewed;  Constitutional: Well developed, Well nourished, Well hydrated, In no acute distress; Head:  Normocephalic, atraumatic; Eyes: EOMI, PERRL, No scleral icterus; ENMT: TM's clear bilat. +edemetous nasal turbinates bilat with clear rhinorrhea. Mouth and pharynx without lesions. No tonsillar exudates. No intra-oral edema. No submandibular or sublingual edema. No hoarse voice, no drooling, no stridor. No pain with manipulation of larynx. No trismus. Mouth and pharynx normal, Mucous membranes moist; Neck: Supple, Full range of motion, No lymphadenopathy; Cardiovascular: Regular rate and rhythm, No gallop; Respiratory: Breath sounds clear &  equal bilaterally, No wheezes.  Speaking full sentences with ease, Normal respiratory effort/excursion; Chest: Nontender, Movement normal; Abdomen: Soft, Nontender, Nondistended, Normal bowel sounds; Genitourinary: No CVA tenderness; Extremities: Peripheral pulses normal, No tenderness, No edema, No calf edema or  asymmetry.; Neuro: AA&Ox3, Major CN grossly intact. Speech clear.  No facial droop.  No nystagmus. Grips equal. Strength 5/5 equal bilat UE's and LE's.  DTR 2/4 equal bilat UE's and LE's.  No gross sensory deficits.  Normal cerebellar testing bilat UE's (finger-nose) and LE's (heel-shin). Climbs on and off stretcher easily by himself. Gait steady..; Skin: Color normal, Warm, Dry.; Psych:  Affect flat.    ED Treatments / Results  Labs (all labs ordered are listed, but only abnormal results are displayed)   EKG None  Radiology   Procedures Procedures (including critical care time)  Medications Ordered in ED Medications - No data to display   Initial Impression / Assessment and Plan / ED Course  I have reviewed the triage vital signs and the nursing notes.  Pertinent labs & imaging results that were available during my care of the patient were reviewed by me and considered in my medical decision making (see chart for details).  MDM Reviewed: previous chart, nursing note and vitals Reviewed previous: labs Interpretation: labs, x-ray and CT scan   Results for orders placed or performed during the hospital encounter of 12/02/17  Comprehensive metabolic panel  Result Value Ref Range   Sodium 141 135 - 145 mmol/L   Potassium 3.9 3.5 - 5.1 mmol/L   Chloride 109 101 - 111 mmol/L   CO2 23 22 - 32 mmol/L   Glucose, Bld 86 65 - 99 mg/dL   BUN 15 6 - 20 mg/dL   Creatinine, Ser 7.820.95 0.61 - 1.24 mg/dL   Calcium 9.0 8.9 - 95.610.3 mg/dL   Total Protein 7.3 6.5 - 8.1 g/dL   Albumin 4.1 3.5 - 5.0 g/dL   AST 26 15 - 41 U/L   ALT 23 17 - 63 U/L   Alkaline Phosphatase 76 38 - 126 U/L   Total Bilirubin 0.7 0.3 - 1.2 mg/dL   GFR calc non Af Amer >60 >60 mL/min   GFR calc Af Amer >60 >60 mL/min   Anion gap 9 5 - 15  Acetaminophen level  Result Value Ref Range   Acetaminophen (Tylenol), Serum <10 (L) 10 - 30 ug/mL  Ethanol  Result Value Ref Range   Alcohol, Ethyl (B) <10 <10 mg/dL    Salicylate level  Result Value Ref Range   Salicylate Lvl <7.0 2.8 - 30.0 mg/dL  CBC with Differential  Result Value Ref Range   WBC 4.2 4.0 - 10.5 K/uL   RBC 4.93 4.22 - 5.81 MIL/uL   Hemoglobin 15.1 13.0 - 17.0 g/dL   HCT 21.344.1 08.639.0 - 57.852.0 %   MCV 89.5 78.0 - 100.0 fL   MCH 30.6 26.0 - 34.0 pg   MCHC 34.2 30.0 - 36.0 g/dL   RDW 46.913.2 62.911.5 - 52.815.5 %   Platelets 234 150 - 400 K/uL   Neutrophils Relative % 56 %   Neutro Abs 2.3 1.7 - 7.7 K/uL   Lymphocytes Relative 29 %   Lymphs Abs 1.2 0.7 - 4.0 K/uL   Monocytes Relative 11 %   Monocytes Absolute 0.5 0.1 - 1.0 K/uL   Eosinophils Relative 3 %   Eosinophils Absolute 0.1 0.0 - 0.7 K/uL   Basophils Relative 1 %   Basophils Absolute 0.1 0.0 - 0.1  K/uL  Urine rapid drug screen (hosp performed)  Result Value Ref Range   Opiates NONE DETECTED NONE DETECTED   Cocaine NONE DETECTED NONE DETECTED   Benzodiazepines NONE DETECTED NONE DETECTED   Amphetamines NONE DETECTED NONE DETECTED   Tetrahydrocannabinol NONE DETECTED NONE DETECTED   Barbiturates NONE DETECTED NONE DETECTED  Urinalysis, Routine w reflex microscopic  Result Value Ref Range   Color, Urine YELLOW YELLOW   APPearance CLEAR CLEAR   Specific Gravity, Urine 1.023 1.005 - 1.030   pH 6.0 5.0 - 8.0   Glucose, UA NEGATIVE NEGATIVE mg/dL   Hgb urine dipstick NEGATIVE NEGATIVE   Bilirubin Urine NEGATIVE NEGATIVE   Ketones, ur 5 (A) NEGATIVE mg/dL   Protein, ur NEGATIVE NEGATIVE mg/dL   Nitrite NEGATIVE NEGATIVE   Leukocytes, UA NEGATIVE NEGATIVE   Dg Chest 2 View Result Date: 12/02/2017 CLINICAL DATA:  Confusion.  Mental status changes. EXAM: CHEST - 2 VIEW COMPARISON:  03/17/2013 FINDINGS: Mild hyperinflation. Midline trachea. Artifactual density projecting over the right apex on the frontal radiograph. Normal heart size and mediastinal contours. No pleural effusion or pneumothorax. Clear lungs. IMPRESSION: No active cardiopulmonary disease. Electronically Signed   By: Jeronimo Greaves M.D.   On: 12/02/2017 13:07   Ct Head Wo Contrast Result Date: 12/02/2017 CLINICAL DATA:  37 year old male with confusion and forgetfulness. EXAM: CT HEAD WITHOUT CONTRAST TECHNIQUE: Contiguous axial images were obtained from the base of the skull through the vertex without intravenous contrast. COMPARISON:  03/17/2013 CT FINDINGS: Brain: No evidence of acute infarction, hemorrhage, hydrocephalus, extra-axial collection or mass lesion/mass effect. Vascular: No hyperdense vessel or unexpected calcification. Skull: Normal. Negative for fracture or focal lesion. Sinuses/Orbits: Bilateral mastoid effusions are noted. The middle ears appear clear. Other: None. IMPRESSION: 1. No evidence of intracranial abnormality. 2. Bilateral mastoid effusions Electronically Signed   By: Harmon Pier M.D.   On: 12/02/2017 13:20    1450:  Workup reassuring. No clear indication for medical admission or psych evaluation at this time. Will have pt f/u Neuro MD and establish with PMD. Dx and testing d/w pt.  Questions answered.  Verb understanding, agreeable to d/c home with outpt f/u.   Final Clinical Impressions(s) / ED Diagnoses   Final diagnoses:  None    ED Discharge Orders    None       Samuel Jester, DO 12/05/17 1246

## 2017-12-02 NOTE — ED Triage Notes (Addendum)
Pt reports for past 2 months or so he been having issues with forgetfulness and trouble forming words. Denies having PCP. No neuro deficits at this time.

## 2017-12-02 NOTE — Discharge Instructions (Signed)
Take your usual prescriptions as previously directed.  Call your regular medical doctor and the Neurologist on Monday to schedule a follow up appointment within the next week.  Return to the Emergency Department immediately sooner if worsening.

## 2018-06-15 ENCOUNTER — Encounter: Payer: Self-pay | Admitting: Family Medicine

## 2018-06-15 ENCOUNTER — Ambulatory Visit (INDEPENDENT_AMBULATORY_CARE_PROVIDER_SITE_OTHER): Payer: 59

## 2018-06-15 ENCOUNTER — Ambulatory Visit (INDEPENDENT_AMBULATORY_CARE_PROVIDER_SITE_OTHER): Payer: 59 | Admitting: Family Medicine

## 2018-06-15 ENCOUNTER — Other Ambulatory Visit: Payer: Self-pay

## 2018-06-15 VITALS — BP 151/97 | HR 105 | Temp 98.9°F | Resp 17 | Ht 74.0 in | Wt 225.0 lb

## 2018-06-15 DIAGNOSIS — M79671 Pain in right foot: Secondary | ICD-10-CM

## 2018-06-15 LAB — POCT CBC
GRANULOCYTE PERCENT: 80.7 % — AB (ref 37–80)
HEMATOCRIT: 45.5 % (ref 43.5–53.7)
HEMOGLOBIN: 15.6 g/dL (ref 14.1–18.1)
LYMPH, POC: 1.2 (ref 0.6–3.4)
MCH, POC: 30.5 pg (ref 27–31.2)
MCHC: 34.2 g/dL (ref 31.8–35.4)
MCV: 89.1 fL (ref 80–97)
MID (cbc): 0.4 (ref 0–0.9)
MPV: 7.4 fL (ref 0–99.8)
POC GRANULOCYTE: 6.7 (ref 2–6.9)
POC LYMPH PERCENT: 14.7 %L (ref 10–50)
POC MID %: 4.6 %M (ref 0–12)
Platelet Count, POC: 335 10*3/uL (ref 142–424)
RBC: 5.11 M/uL (ref 4.69–6.13)
RDW, POC: 14 %
WBC: 8.3 10*3/uL (ref 4.6–10.2)

## 2018-06-15 NOTE — Progress Notes (Addendum)
Chief Complaint  Patient presents with  . right foot pain x 2 days    per pt no injury to foot just woke up two days ago with pain,redness and inflammation in foot that has gotten worse. Per pt he is unable to put any weight on foot.  Pain level 8/10.  Taking ibuprofen for pain without relief    HPI  Pt reports that there is no injury that he can recall but that he woke up this morning with an inability to weight bear on the right foot He had some crutches which he used to get to clinic He denies fevers or chills He reports that the pain is 8/10 He states that he had to miss work today He drive a delivery truck He has a history of bilateral contractures of the toes and cannot straighten his 2nd through 4th digits He has tried the ibuprofen and tylenol today His pain yesterday was tolerable He reports that the pain was initially tolerable yesterday and continues to build.  Lab Results  Component Value Date   CREATININE 0.95 12/02/2017     Past Medical History:  Diagnosis Date  . Schizophrenia (HCC)     No current outpatient medications on file.   No current facility-administered medications for this visit.     Allergies: No Known Allergies  Past Surgical History:  Procedure Laterality Date  . APPENDECTOMY      Social History   Socioeconomic History  . Marital status: Single    Spouse name: Not on file  . Number of children: Not on file  . Years of education: Not on file  . Highest education level: Not on file  Occupational History  . Not on file  Social Needs  . Financial resource strain: Not on file  . Food insecurity:    Worry: Not on file    Inability: Not on file  . Transportation needs:    Medical: Not on file    Non-medical: Not on file  Tobacco Use  . Smoking status: Former Smoker    Packs/day: 0.50    Types: Cigarettes  . Smokeless tobacco: Never Used  Substance and Sexual Activity  . Alcohol use: Yes    Alcohol/week: 5.0 standard drinks   Types: 5 Cans of beer per week  . Drug use: No  . Sexual activity: Not on file  Lifestyle  . Physical activity:    Days per week: Not on file    Minutes per session: Not on file  . Stress: Not on file  Relationships  . Social connections:    Talks on phone: Not on file    Gets together: Not on file    Attends religious service: Not on file    Active member of club or organization: Not on file    Attends meetings of clubs or organizations: Not on file    Relationship status: Not on file  Other Topics Concern  . Not on file  Social History Narrative  . Not on file    No family history on file.   ROS Review of Systems See HPI Constitution: No fevers or chills No malaise No diaphoresis Skin: No rash or itching Eyes: no blurry vision, no double vision GU: no dysuria or hematuria Neuro: no dizziness or headaches all others reviewed and negative   Objective: Vitals:   06/15/18 1154  BP: (!) 151/97  Pulse: (!) 105  Resp: 17  Temp: 98.9 F (37.2 C)  TempSrc: Oral  SpO2: 98%  Weight: 225 lb (102.1 kg)  Height: 6\' 2"  (1.88 m)    Physical Exam  Constitutional: He is oriented to person, place, and time. He appears well-developed and well-nourished.  HENT:  Head: Normocephalic and atraumatic.  Eyes: Conjunctivae and EOM are normal.  Cardiovascular:  Pulses:      Dorsalis pedis pulses are 2+ on the right side, and 2+ on the left side.       Posterior tibial pulses are 2+ on the right side, and 2+ on the left side.  Pulmonary/Chest: Effort normal.  Musculoskeletal:       Feet:  Neurological: He is alert and oriented to person, place, and time.  Skin: Skin is warm. Capillary refill takes less than 2 seconds.        EXAM: RIGHT FOOT COMPLETE - 3+ VIEW  COMPARISON:  None.  FINDINGS: Frontal, oblique, and lateral views obtained. There is dorsiflexion of the second, third, fourth, and fifth MTP joints with plantar flexion of the second, third, fourth, and  fifth PIP joints. No appreciable joint space narrowing or erosion. No fracture or dislocation. There is mild pes cavus.  IMPRESSION: Flexion of multiple joints. No fracture or dislocation. No appreciable joint space narrowing or erosion. Mild pes cavus.   Electronically Signed   By: Bretta Bang III M.D.   On: 06/15/2018 12:14     Assessment and Plan Nyzier was seen today for right foot pain x 2 days.  Diagnoses and all orders for this visit:  Acute foot pain, right -     DG Foot Complete Right; Future -     POCT CBC -     Uric Acid -     Basic metabolic panel   -  Uncertain cause of foot pain -  Reviewed renal function and pt has normal kidney function -  Will reassess today -  For now patient should continue ibuprofen, ice and elevation  -  Will await uric acid result -  For now patient declined prednisone and will continue NSAIDs  Cebastian Neis A Creta Levin

## 2018-06-15 NOTE — Patient Instructions (Addendum)
     If you have lab work done today you will be contacted with your lab results within the next 2 weeks.  If you have not heard from us then please contact us. The fastest way to get your results is to register for My Chart.   IF you received an x-ray today, you will receive an invoice from [facility]. Please contact [facility] at [phone] with questions or concerns regarding your invoice.   IF you received labwork today, you will receive an invoice from [facility]. Please contact [facility] at [phone] with questions or concerns regarding your invoice.   Our billing staff will not be able to assist you with questions regarding bills from these companies.  You will be contacted with the lab results as soon as they are available. The fastest way to get your results is to activate your My Chart account. Instructions are located on the last page of this paperwork. If you have not heard from us regarding the results in 2 weeks, please contact this office.     Foot Pain Many things can cause foot pain. Some common causes are:  An injury.  A sprain.  Arthritis.  Blisters.  Bunions.  Follow these instructions at home: Pay attention to any changes in your symptoms. Take these actions to help with your discomfort:  If directed, put ice on the affected area: ? Put ice in a plastic bag. ? Place a towel between your skin and the bag. ? Leave the ice on for 15-20 minutes, 3?4 times a day for 2 days.  Take over-the-counter and prescription medicines only as told by your health care provider.  Wear comfortable, supportive shoes that fit you well. Do not wear high heels.  Do not stand or walk for long periods of time.  Do not lift a lot of weight. This can put added pressure on your feet.  Do stretches to relieve foot pain and stiffness as told by your health care provider.  Rub your foot gently.  Keep your feet clean and dry.  Contact a health care  provider if:  Your pain does not get better after a few days of self-care.  Your pain gets worse.  You cannot stand on your foot. Get help right away if:  Your foot is numb or tingling.  Your foot or toes are swollen.  Your foot or toes turn white or blue.  You have warmth and redness along your foot. This information is not intended to replace advice given to you by your health care provider. Make sure you discuss any questions you have with your health care provider. Document Released: 09/18/2015 Document Revised: 01/28/2016 Document Reviewed: 09/17/2014 Elsevier Interactive Patient Education  2018 [employer] Inc[employer]  

## 2018-06-16 LAB — BASIC METABOLIC PANEL
BUN/Creatinine Ratio: 11 (ref 9–20)
BUN: 12 mg/dL (ref 6–20)
CHLORIDE: 104 mmol/L (ref 96–106)
CO2: 18 mmol/L — AB (ref 20–29)
Calcium: 9.6 mg/dL (ref 8.7–10.2)
Creatinine, Ser: 1.09 mg/dL (ref 0.76–1.27)
GFR calc non Af Amer: 86 mL/min/{1.73_m2} (ref >59)
GFR, EST AFRICAN AMERICAN: 100 mL/min/{1.73_m2} (ref >59)
Glucose: 99 mg/dL (ref 65–99)
POTASSIUM: 4.3 mmol/L (ref 3.5–5.2)
SODIUM: 142 mmol/L (ref 134–144)

## 2018-06-16 LAB — URIC ACID: URIC ACID: 9.3 mg/dL — AB (ref 3.7–8.6)

## 2018-06-16 MED ORDER — INDOMETHACIN 50 MG PO CAPS: 50.0000 mg | ORAL_CAPSULE | Freq: Three times a day (TID) | ORAL | 0 refills | Status: DC

## 2018-06-16 MED ORDER — COLCHICINE 0.6 MG PO TABS: ORAL_TABLET | ORAL | 3 refills | Status: DC

## 2018-06-16 NOTE — Addendum Note (Signed)
Addended by: Collie Siad A on: 06/16/2018 04:17 PM   Modules accepted: Orders

## 2018-09-20 DIAGNOSIS — R413 Other amnesia: Secondary | ICD-10-CM | POA: Diagnosis not present

## 2018-09-20 DIAGNOSIS — H5500 Unspecified nystagmus: Secondary | ICD-10-CM | POA: Diagnosis not present

## 2018-09-27 DIAGNOSIS — R5381 Other malaise: Secondary | ICD-10-CM | POA: Diagnosis not present

## 2018-10-16 DIAGNOSIS — J309 Allergic rhinitis, unspecified: Secondary | ICD-10-CM | POA: Diagnosis not present

## 2018-10-18 ENCOUNTER — Ambulatory Visit (INDEPENDENT_AMBULATORY_CARE_PROVIDER_SITE_OTHER): Payer: 59 | Admitting: Internal Medicine

## 2018-10-18 ENCOUNTER — Encounter: Payer: Self-pay | Admitting: Internal Medicine

## 2018-10-18 VITALS — BP 110/80 | HR 92 | Temp 98.3°F | Ht 74.0 in | Wt 233.0 lb

## 2018-10-18 DIAGNOSIS — Z23 Encounter for immunization: Secondary | ICD-10-CM | POA: Diagnosis not present

## 2018-10-18 DIAGNOSIS — R0602 Shortness of breath: Secondary | ICD-10-CM | POA: Diagnosis not present

## 2018-10-18 DIAGNOSIS — R413 Other amnesia: Secondary | ICD-10-CM | POA: Diagnosis not present

## 2018-10-18 LAB — CBC WITH DIFFERENTIAL/PLATELET
Basophils Absolute: 0.1 10*3/uL (ref 0.0–0.1)
Basophils Relative: 1 % (ref 0.0–3.0)
Eosinophils Absolute: 0.2 10*3/uL (ref 0.0–0.7)
Eosinophils Relative: 3.2 % (ref 0.0–5.0)
HCT: 46.3 % (ref 39.0–52.0)
Hemoglobin: 16 g/dL (ref 13.0–17.0)
LYMPHS ABS: 1.4 10*3/uL (ref 0.7–4.0)
Lymphocytes Relative: 22 % (ref 12.0–46.0)
MCHC: 34.6 g/dL (ref 30.0–36.0)
MCV: 89.6 fl (ref 78.0–100.0)
MONO ABS: 0.6 10*3/uL (ref 0.1–1.0)
Monocytes Relative: 8.9 % (ref 3.0–12.0)
NEUTROS PCT: 64.9 % (ref 43.0–77.0)
Neutro Abs: 4 10*3/uL (ref 1.4–7.7)
PLATELETS: 312 10*3/uL (ref 150.0–400.0)
RBC: 5.17 Mil/uL (ref 4.22–5.81)
RDW: 13.6 % (ref 11.5–15.5)
WBC: 6.2 10*3/uL (ref 4.0–10.5)

## 2018-10-18 LAB — TSH: TSH: 1.73 u[IU]/mL (ref 0.35–4.50)

## 2018-10-18 LAB — COMPREHENSIVE METABOLIC PANEL
ALK PHOS: 86 U/L (ref 39–117)
ALT: 50 U/L (ref 0–53)
AST: 35 U/L (ref 0–37)
Albumin: 4.7 g/dL (ref 3.5–5.2)
BUN: 18 mg/dL (ref 6–23)
CO2: 30 mEq/L (ref 19–32)
CREATININE: 1.07 mg/dL (ref 0.40–1.50)
Calcium: 9.5 mg/dL (ref 8.4–10.5)
Chloride: 100 mEq/L (ref 96–112)
GFR: 77.56 mL/min (ref >60.00)
GLUCOSE: 100 mg/dL — AB (ref 70–99)
Potassium: 4.4 mEq/L (ref 3.5–5.1)
Sodium: 137 mEq/L (ref 135–145)
TOTAL PROTEIN: 7.7 g/dL (ref 6.0–8.3)
Total Bilirubin: 0.3 mg/dL (ref 0.2–1.2)

## 2018-10-18 LAB — VITAMIN B12: Vitamin B-12: 380 pg/mL (ref 211–911)

## 2018-10-18 MED ORDER — ALBUTEROL SULFATE HFA 108 (90 BASE) MCG/ACT IN AERS: 2.0000 | INHALATION_SPRAY | Freq: Four times a day (QID) | RESPIRATORY_TRACT | 0 refills | Status: DC | PRN

## 2018-10-18 NOTE — Patient Instructions (Signed)
-  Nice meeting you today!  -Albuterol inhaler 2 puffs up to every 6 hours as needed for shortness of breath.  -Keep a diary of your symptoms.  -Lab work today. Will notify you when results are available.  -Schedule follow up in 6 months.

## 2018-10-18 NOTE — Progress Notes (Signed)
New Patient Office Visit     CC/Reason for Visit: Establish care, SOB, memory issues Previous PCP: [facility] Last Visit: last Tuesday  HPI: William Romero is a 38 y.o. male who is coming in today for the above mentioned reasons. No PMH of significance. Has been having SOB on and off for a few months. No related to exertion, no associated CP or any other symptoms. Had a recent URI but states the SOB precedes the URI and has persisted past the resolution of other URI symptoms.  He relates it has having to take deep breaths in order to get oxygen in every time the episodes occur. He denies increased social stressors. He smokes occasionally but is unable to quantify. He wonders if this may be caused by "malevolence towards him from other people" and wants me to do blood work to check for this malevolence. He recently saw the [facility] walk-in clinic this week and was prescribed flonase and singulair that he has been taking. He also mentions issues with his memory over the past 6 months but again cannot explain more than to say "I forget things that I shouldn't be forgetting".   Past Medical/Surgical History: Past Medical History:  Diagnosis Date  . Schizophrenia [facility])     Past Surgical History:  Procedure Laterality Date  . APPENDECTOMY      Social History:  reports that he has quit smoking. His smoking use included cigarettes. He smoked 0.50 packs per day. He has never used smokeless tobacco. He reports current alcohol use of about 5.0 standard drinks of alcohol per week. He reports that he does not use drugs.  Allergies: No Known Allergies  Family History:  No FH of heart disease, stroke or cancer that he is aware of  Current Outpatient Medications:  .  albuterol (PROVENTIL HFA;VENTOLIN HFA) 108 (90 Base) MCG/ACT inhaler, Inhale 2 puffs into the lungs every 6 (six) hours as needed for wheezing or shortness of breath., Disp: 1 Inhaler, Rfl: 0  Review of Systems:    Constitutional: Denies fever, chills, diaphoresis, appetite change and fatigue.  HEENT: Denies photophobia, eye pain, redness, hearing loss, ear pain, congestion, sore throat, rhinorrhea, sneezing, mouth sores, trouble swallowing, neck pain, neck stiffness and tinnitus.   Respiratory: Denies  DOE, cough, chest tightness,  and wheezing.   Cardiovascular: Denies chest pain, palpitations and leg swelling.  Gastrointestinal: Denies nausea, vomiting, abdominal pain, diarrhea, constipation, blood in stool and abdominal distention.  Genitourinary: Denies dysuria, urgency, frequency, hematuria, flank pain and difficulty urinating.  Endocrine: Denies: hot or cold intolerance, sweats, changes in hair or nails, polyuria, polydipsia. Musculoskeletal: Denies myalgias, back pain, joint swelling, arthralgias and gait problem.  Skin: Denies pallor, rash and wound.  Neurological: Denies dizziness, seizures, syncope, weakness, light-headedness, numbness and headaches.  Hematological: Denies adenopathy. Easy bruising, personal or family bleeding history  Psychiatric/Behavioral: Denies suicidal ideation, mood changes, confusion, nervousness, sleep disturbance and agitation    Physical Exam: Vitals:   10/18/18 1056  BP: 110/80  Pulse: 92  Temp: 98.3 F (36.8 C)  TempSrc: Oral  SpO2: 97%  Weight: 233 lb (105.7 kg)  Height: 6\' 2"  (1.88 m)   Body mass index is 29.92 kg/m.  Constitutional: NAD, calm, comfortable Eyes: PERRL, lids and conjunctivae normal ENMT: Mucous membranes are moist. Neck: normal, supple, no masses, no thyromegaly Respiratory: clear to auscultation bilaterally, no wheezing, no crackles. Normal respiratory effort. No accessory muscle use.  Cardiovascular: Regular rate and rhythm, no murmurs /  rubs / gallops. No extremity edema. 2+ pedal pulses. No carotid bruits.  Abdomen: no tenderness, no masses palpated. No hepatosplenomegaly. Bowel sounds positive.  Musculoskeletal: no clubbing /  cyanosis. No joint deformity upper and lower extremities. Good ROM, no contractures. Normal muscle tone.  Neurologic: CN 2-12 grossly intact. Sensation intact, DTR normal. Strength 5/5 in all 4. .    Impression and Plan:  SOB (shortness of breath) -Etiology remains unclear. Lungs are clear, no h/o asthma, does not sound like an anginal equivalent and he is young without risk factors. -He would like an albuterol inhaler, which I have given. -I wonder if anxiety/panic attacks may be playing a role even tho he denies social stressors currently.  Memory deficit  -check basic labs including TSH and B12 to evaluate. -may need neurology referral if persistent. -hard to evaluate as he is unable to describe. -my initial impression is that he may have either an undiagnosed mental health issue or a personality disorder. As I get to know him better, may recommend psychiatry referral.  Need for diphtheria-tetanus-pertussis (Tdap) vaccine - Plan: Tdap vaccine greater than or equal to 7yo IM     Patient Instructions  -Nice meeting you today!  -Albuterol inhaler 2 puffs up to every 6 hours as needed for shortness of breath.  -Keep a diary of your symptoms.  -Lab work today. Will notify you when results are available.  -Schedule follow up in 6 months.     [name], MD [facility]

## 2018-10-19 ENCOUNTER — Telehealth: Payer: Self-pay | Admitting: Internal Medicine

## 2018-10-19 NOTE — Telephone Encounter (Signed)
Charted in result notes. 

## 2018-10-19 NOTE — Telephone Encounter (Signed)
Copied from CRM [id number]. Topic: Quick Communication - See Telephone Encounter >> Oct 19, 2018  3:46 PM Jens Som A wrote: CRM for notification. See Telephone encounter for: 10/19/18. Patient is calling for lab results. Please advise. Thank you

## 2018-10-23 ENCOUNTER — Encounter: Payer: Self-pay | Admitting: Internal Medicine

## 2018-10-23 ENCOUNTER — Other Ambulatory Visit: Payer: Self-pay | Admitting: Internal Medicine

## 2018-10-23 DIAGNOSIS — R413 Other amnesia: Secondary | ICD-10-CM

## 2018-10-25 ENCOUNTER — Encounter: Payer: Self-pay | Admitting: Internal Medicine

## 2018-10-25 ENCOUNTER — Ambulatory Visit (INDEPENDENT_AMBULATORY_CARE_PROVIDER_SITE_OTHER): Payer: 59 | Admitting: Internal Medicine

## 2018-10-25 ENCOUNTER — Inpatient Hospital Stay: Admission: RE | Admit: 2018-10-25 | Payer: Self-pay | Source: Ambulatory Visit

## 2018-10-25 ENCOUNTER — Ambulatory Visit (INDEPENDENT_AMBULATORY_CARE_PROVIDER_SITE_OTHER): Payer: 59

## 2018-10-25 VITALS — BP 120/78 | HR 77 | Temp 98.5°F | Wt 234.2 lb

## 2018-10-25 DIAGNOSIS — R0602 Shortness of breath: Secondary | ICD-10-CM

## 2018-10-25 DIAGNOSIS — R0600 Dyspnea, unspecified: Secondary | ICD-10-CM | POA: Diagnosis not present

## 2018-10-25 NOTE — Progress Notes (Signed)
Acute Office Visit     CC/Reason for Visit: SOB  HPI: William Romero is a 38 y.o. male who is coming in today for the above mentioned reasons. He was seen earlier this month for the same reason and was given a trial of an albuterol inhaler. He states there has been no improvement. He can't really describe the SOB. He states that it feels like he can't take a deep breath, not related to exertion, no CP or chest tightness. He works as a Naval architect and feels like certain environments tend to make it worse but he cannot elaborate.   Past Medical/Surgical History: Past Medical History:  Diagnosis Date  . Schizophrenia [facility])     Past Surgical History:  Procedure Laterality Date  . APPENDECTOMY      Social History:  reports that he has quit smoking. His smoking use included cigarettes. He smoked 0.50 packs per day. He has never used smokeless tobacco. He reports current alcohol use of about 5.0 standard drinks of alcohol per week. He reports that he does not use drugs.  Allergies: No Known Allergies  Family History:  No h/o heart disease, stroke or cancer  Current Outpatient Medications:  .  albuterol (PROVENTIL HFA;VENTOLIN HFA) 108 (90 Base) MCG/ACT inhaler, Inhale 2 puffs into the lungs every 6 (six) hours as needed for wheezing or shortness of breath., Disp: 1 Inhaler, Rfl: 0  Review of Systems: Constitutional: Denies fever, chills, diaphoresis, appetite change and fatigue.  HEENT: Denies photophobia, eye pain, redness, hearing loss, ear pain, congestion, sore throat, rhinorrhea, sneezing, mouth sores, trouble swallowing, neck pain, neck stiffness and tinnitus.   Respiratory: Denies  DOE, cough, chest tightness,  and wheezing.   Cardiovascular: Denies chest pain, palpitations and leg swelling.  Gastrointestinal: Denies nausea, vomiting, abdominal pain, diarrhea, constipation, blood in stool and abdominal distention.  Genitourinary: Denies dysuria, urgency,  frequency, hematuria, flank pain and difficulty urinating.  Endocrine: Denies: hot or cold intolerance, sweats, changes in hair or nails, polyuria, polydipsia. Musculoskeletal: Denies myalgias, back pain, joint swelling, arthralgias and gait problem.  Skin: Denies pallor, rash and wound.  Neurological: Denies dizziness, seizures, syncope, weakness, light-headedness, numbness and headaches.  Hematological: Denies adenopathy. Easy bruising, personal or family bleeding history  Psychiatric/Behavioral: Denies suicidal ideation, mood changes, confusion, nervousness, sleep disturbance and agitation    Physical Exam: Vitals:   10/25/18 1535  BP: 120/78  Pulse: 77  Temp: 98.5 F (36.9 C)  TempSrc: Oral  SpO2: 97%  Weight: 234 lb 3.2 oz (106.2 kg)    Body mass index is 30.07 kg/m.   Constitutional: NAD, calm, comfortable Eyes: PERRL, lids and conjunctivae normal ENMT: Mucous membranes are moist.  Respiratory: clear to auscultation bilaterally, no wheezing, no crackles. Normal respiratory effort. No accessory muscle use.  Cardiovascular: Regular rate and rhythm, no murmurs / rubs / gallops. No extremity edema. 2+ pedal pulses. No carotid bruits.  Musculoskeletal: no clubbing / cyanosis. No joint deformity upper and lower extremities. Good ROM, no contractures. Normal muscle tone.  Skin: no rashes, lesions, ulcers. No induration Neurologic: CN 2-12 grossly intact. Sensation intact, DTR normal. Strength 5/5 in all 4.  Psychiatric: Normal judgment and insight. Alert and oriented x 3. Normal mood.    Impression and Plan:  SOB (shortness of breath)  -Unclear etiology. -No reflux symptoms. -Since this is his second visit in 2 weeks for the same will obtain a CXR and an EKG. -EKG as interpreted by myself; NSR, rate 72,  nonspecific QRS widening, no ST-T changes. -If CXR normal and symptoms persist, consider pulmonary referral.      Estela Philip Aspen, MD [facility] at  [facility]

## 2018-10-26 ENCOUNTER — Encounter: Payer: Self-pay | Admitting: Internal Medicine

## 2018-10-26 ENCOUNTER — Other Ambulatory Visit: Payer: Self-pay | Admitting: Internal Medicine

## 2018-10-26 DIAGNOSIS — R0602 Shortness of breath: Secondary | ICD-10-CM

## 2018-11-02 ENCOUNTER — Ambulatory Visit (INDEPENDENT_AMBULATORY_CARE_PROVIDER_SITE_OTHER): Payer: 59 | Admitting: Neurology

## 2018-11-02 ENCOUNTER — Encounter: Payer: Self-pay | Admitting: Neurology

## 2018-11-02 ENCOUNTER — Other Ambulatory Visit: Payer: Self-pay

## 2018-11-02 VITALS — BP 126/88 | HR 85 | Ht 74.0 in | Wt 235.0 lb

## 2018-11-02 DIAGNOSIS — R400 Somnolence: Secondary | ICD-10-CM

## 2018-11-02 DIAGNOSIS — R413 Other amnesia: Secondary | ICD-10-CM | POA: Diagnosis not present

## 2018-11-02 NOTE — Progress Notes (Signed)
NEUROLOGY CONSULTATION NOTE  William Romero MRN: [id number] DOB: 11-17-1980  Referring provider: Dr. [name] Primary care provider: Dr. [name]  Reason for consult:  Memory loss  Dear Dr Loreta Ave:  Thank you for your kind referral of William Romero for consultation of the above symptoms. Although his history is well known to you, please allow me to reiterate it for the purpose of our medical record. Records and images were personally reviewed where available.  HISTORY OF PRESENT ILLNESS: This is a 38 year old right-handed man with a history of ADD, ?schizophrenia noted on PMH (not on medication), presenting for evaluation of memory loss. He started noticing changes around 6-12 months ago and "it's not getting any better." He has noticed he has to put more thought into things. He has to focus more on simple tasks. Even when singing along to songs on the radio, his memory is just not there. He has difficulty finding words in conversations. He lives with his mother who has indirectly noted memory issues, "more of mocking." He denies getting lost driving, no missed bills. He is not on any medications. He works Chief Financial Officer for [employer] and has no difficulties with work. There is no family history of dementia, no significant head injuries. He has increased drinking alcohol over the past year, drinking a pint every other day, due to a lot of stress. He states stress is from many different things, "work, home, Building services engineer of others." He has a history of ADD in school and was on Adderall until 3-5 years ago. He occasionally has pressure in the bilateral temporal regions lasting all day, no associated nausea/vomiting, vision changes. No diplopia, dysarthria/dysphagia, neck/back pain, bowel/bladder dysfunction, anosmia, or tremors. He has occasional tingling in the ulnar distribution of both hands when sleeping. He was told his "eyes were jumpy and could be  autoimmune" when he saw a doctor last month. He states mood is good. He has sleep difficulties, waking up every 2-3 hours, with unrefreshing sleep and daytime drowsiness. He has been told he snores.   Laboratory Data: Lab Results  Component Value Date   TSH 1.73 10/18/2018   Lab Results  Component Value Date   VITAMINB12 380 10/18/2018   PAST MEDICAL HISTORY: Past Medical History:  Diagnosis Date  . Schizophrenia (HCC)     PAST SURGICAL HISTORY: Past Surgical History:  Procedure Laterality Date  . APPENDECTOMY      MEDICATIONS: Current Outpatient Medications on File Prior to Visit  Medication Sig Dispense Refill  . albuterol (PROVENTIL HFA;VENTOLIN HFA) 108 (90 Base) MCG/ACT inhaler Inhale 2 puffs into the lungs every 6 (six) hours as needed for wheezing or shortness of breath. (Patient not taking: Reported on 11/02/2018) 1 Inhaler 0   No current facility-administered medications on file prior to visit.     ALLERGIES: No Known Allergies  FAMILY HISTORY: History reviewed. No pertinent family history.  SOCIAL HISTORY: Social History   Socioeconomic History  . Marital status: Single    Spouse name: Not on file  . Number of children: Not on file  . Years of education: Not on file  . Highest education level: Not on file  Occupational History  . Not on file  Social Needs  . Financial resource strain: Not on file  . Food insecurity:    Worry: Not on file    Inability: Not on file  . Transportation needs:    Medical: Not on file    Non-medical: Not  on file  Tobacco Use  . Smoking status: Former Smoker    Packs/day: 0.50    Types: Cigarettes  . Smokeless tobacco: Never Used  Substance and Sexual Activity  . Alcohol use: Yes    Alcohol/week: 5.0 standard drinks    Types: 5 Cans of beer per week  . Drug use: No  . Sexual activity: Not on file  Lifestyle  . Physical activity:    Days per week: Not on file    Minutes per session: Not on file  . Stress: Not on  file  Relationships  . Social connections:    Talks on phone: Not on file    Gets together: Not on file    Attends religious service: Not on file    Active member of club or organization: Not on file    Attends meetings of clubs or organizations: Not on file    Relationship status: Not on file  . Intimate partner violence:    Fear of current or ex partner: Not on file    Emotionally abused: Not on file    Physically abused: Not on file    Forced sexual activity: Not on file  Other Topics Concern  . Not on file  Social History Narrative  . Not on file    REVIEW OF SYSTEMS: Constitutional: No fevers, chills, or sweats, no generalized fatigue, change in appetite Eyes: No visual changes, double vision, eye pain Ear, nose and throat: No hearing loss, ear pain, nasal congestion, sore throat Cardiovascular: No chest pain, palpitations Respiratory:  No shortness of breath at rest or with exertion, wheezes GastrointestinaI: No nausea, vomiting, diarrhea, abdominal pain, fecal incontinence Genitourinary:  No dysuria, urinary retention or frequency Musculoskeletal:  No neck pain, back pain Integumentary: No rash, pruritus, skin lesions Neurological: as above Psychiatric: No depression, insomnia, anxiety Endocrine: No palpitations, fatigue, diaphoresis, mood swings, change in appetite, change in weight, increased thirst Hematologic/Lymphatic:  No anemia, purpura, petechiae. Allergic/Immunologic: no itchy/runny eyes, nasal congestion, recent allergic reactions, rashes  PHYSICAL EXAM: Vitals:   11/02/18 1040  BP: 126/88  Pulse: 85  SpO2: 95%   General: No acute distress Head:  Normocephalic/atraumatic Eyes: Fundoscopic exam shows bilateral sharp discs, no vessel changes, exudates, or hemorrhages Neck: supple, no paraspinal tenderness, full range of motion Back: No paraspinal tenderness Heart: regular rate and rhythm Lungs: Clear to auscultation bilaterally. Vascular: No carotid  bruits. Skin/Extremities: No rash, no edema Neurological Exam: Mental status: alert and oriented to person, place, and time, no dysarthria or aphasia, Fund of knowledge is appropriate.  Recent and remote memory are intact.  Attention and concentration are normal.    Able to name objects and repeat phrases.  Montreal Cognitive Assessment  11/02/2018  Visuospatial/ Executive (0/5) 5  Naming (0/3) 3  Attention: Read list of digits (0/2) 2  Attention: Read list of letters (0/1) 1  Attention: Serial 7 subtraction starting at 100 (0/3) 3  Language: Repeat phrase (0/2) 2  Language : Fluency (0/1) 1  Abstraction (0/2) 2  Delayed Recall (0/5) 5  Orientation (0/6) 6  Total 30   Cranial nerves: CN I: not tested CN II: pupils equal, round and reactive to light, visual fields intact, fundi unremarkable. CN III, IV, VI:  full range of motion, no nystagmus, no ptosis CN V: facial sensation intact CN VII: upper and lower face symmetric CN VIII: hearing intact to finger rub CN IX, X: gag intact, uvula midline CN XI: sternocleidomastoid and trapezius muscles intact CN  XII: tongue midline Bulk & Tone: normal, no fasciculations. Motor: 5/5 throughout with no pronator drift. Sensation: intact to light touch, cold, pin, vibration and joint position sense.  No extinction to double simultaneous stimulation.  Romberg test negative Deep Tendon Reflexes: +1 throughout, no ankle clonus Plantar responses: downgoing bilaterally Cerebellar: no incoordination on finger to nose, heel to shin. No dysdiadochokinesia Gait: narrow-based and steady, able to tandem walk adequately. Tremor: none  IMPRESSION: This is a 38 year old right-handed man with a history of ADD, ?schizophrenia (not on medication), presenting for evaluation of worsening memory. Neurological exam normal, MOCA score normal 30/30. He reports word-finding difficulties, difficulty focusing, and head pressure. We discussed different causes of memory  loss. TSH and B12 normal. MRI brain without contrast will be ordered to assess for underlying structural abnormality. He has symptoms raising concern for sleep apnea, which can also cause cognitive issues, home sleep study will be done. We discussed effects of stress, anxiety on memory, he reports a lot of stress increasing his alcohol intake. He declines [employer] and will work on reducing alcohol intake. We also discussed the history of ADD and his report of focusing difficulties, if tests normal, consider restarting Adderall with PCP to see if this helps. If MRI brain and sleep study normal, follow-up on prn basis.   Thank you for allowing me to participate in the care of this patient. Please do not hesitate to call for any questions or concerns.   Patrcia Dolly, M.D.  CC: Dr. Loreta Ave

## 2018-11-02 NOTE — Patient Instructions (Addendum)
1. Schedule MRI brain without contrast 2. Schedule home sleep study 3. If tests come back normal, please discuss restarting ADD medication with your PCP 4. Continue on working on stress management, reduce alcohol intake   We have sent a referral to [facility] for your MRI and they will call you directly to schedule your appt. They are located at [address] [facility]. If you need to contact them directly please call [phone].

## 2018-11-12 ENCOUNTER — Other Ambulatory Visit: Payer: Self-pay | Admitting: Neurology

## 2018-11-13 ENCOUNTER — Other Ambulatory Visit: Payer: 59

## 2018-11-18 ENCOUNTER — Other Ambulatory Visit: Payer: 59

## 2018-11-27 ENCOUNTER — Ambulatory Visit (INDEPENDENT_AMBULATORY_CARE_PROVIDER_SITE_OTHER): Payer: 59 | Admitting: Internal Medicine

## 2018-11-27 ENCOUNTER — Other Ambulatory Visit: Payer: Self-pay

## 2018-11-27 ENCOUNTER — Telehealth: Payer: Self-pay | Admitting: *Deleted

## 2018-11-27 DIAGNOSIS — F909 Attention-deficit hyperactivity disorder, unspecified type: Secondary | ICD-10-CM | POA: Diagnosis not present

## 2018-11-27 DIAGNOSIS — R413 Other amnesia: Secondary | ICD-10-CM

## 2018-11-27 DIAGNOSIS — R0602 Shortness of breath: Secondary | ICD-10-CM | POA: Diagnosis not present

## 2018-11-27 MED ORDER — AMPHETAMINE-DEXTROAMPHET ER 10 MG PO CP24: 10.0000 mg | ORAL_CAPSULE | Freq: Every day | ORAL | 0 refills | Status: DC

## 2018-11-27 NOTE — Telephone Encounter (Signed)
Or telephone visit  CRM

## 2018-11-27 NOTE — Telephone Encounter (Signed)
Left message on machine for patient to reschedule in office appointment to [employer]

## 2018-11-27 NOTE — Progress Notes (Signed)
Virtual Visit via Telephone Note  I connected with William Romero on 11/27/18 at  3:30 PM EDT by telephone and verified that I am speaking with the correct person using two identifiers.   I discussed the limitations, risks, security and privacy concerns of performing an evaluation and management service by telephone and the availability of in person appointments. I also discussed with the patient that there may be a patient responsible charge related to this service. The patient expressed understanding and agreed to proceed.  Location patient: home Location provider: work office Participants present for the call: patient, provider Patient did not have a visit in the prior 7 days to address this/these issue(s).   History of Present Illness:  The purpose of this appointment was mainly a follow-up on his shortness of breath and memory issues.  Since I last saw him in February he has seen neurology, Dr. Karel Jarvis, she wanted to send him for an MRI or sleep study but patient has not scheduled due to financial issues.  Dr. Karel Jarvis also makes a mention in her note that with his prior history of ADHD he might benefit from resuming Adderall to see if this helps with his memory issues.  He states his memory issues are not getting worse at this time.  In regards to shortness of breath that we had previously evaluated him for, he states he is significantly improved, chest x-ray and EKG that we did in the office were without acute changes on prior visit.   Observations/Objective: Patient sounds cheerful and well on the phone. I do not appreciate any increased work of breathing. Speech and thought processing are grossly intact. Patient reported vitals: None reported   Current Outpatient Medications:  .  albuterol (PROVENTIL HFA;VENTOLIN HFA) 108 (90 Base) MCG/ACT inhaler, Inhale 2 puffs into the lungs every 6 (six) hours as needed for wheezing or shortness of breath. (Patient not taking: Reported on  11/02/2018), Disp: 1 Inhaler, Rfl: 0 .  amphetamine-dextroamphetamine (ADDERALL XR) 10 MG 24 hr capsule, Take 1 capsule (10 mg total) by mouth daily., Disp: 30 capsule, Rfl: 0 .  amphetamine-dextroamphetamine (ADDERALL XR) 10 MG 24 hr capsule, Take 1 capsule (10 mg total) by mouth daily., Disp: 30 capsule, Rfl: 0 .  amphetamine-dextroamphetamine (ADDERALL XR) 10 MG 24 hr capsule, Take 1 capsule (10 mg total) by mouth daily., Disp: 30 capsule, Rfl: 0  Review of Systems:  Constitutional: Denies fever, chills, diaphoresis, appetite change and fatigue.  HEENT: Denies photophobia, eye pain, redness, hearing loss, ear pain, congestion, sore throat, rhinorrhea, sneezing, mouth sores, trouble swallowing, neck pain, neck stiffness and tinnitus.   Respiratory: Denies  DOE, cough, chest tightness,  and wheezing.   Cardiovascular: Denies chest pain, palpitations and leg swelling.  Gastrointestinal: Denies nausea, vomiting, abdominal pain, diarrhea, constipation, blood in stool and abdominal distention.  Genitourinary: Denies dysuria, urgency, frequency, hematuria, flank pain and difficulty urinating.  Endocrine: Denies: hot or cold intolerance, sweats, changes in hair or nails, polyuria, polydipsia. Musculoskeletal: Denies myalgias, back pain, joint swelling, arthralgias and gait problem.  Skin: Denies pallor, rash and wound.  Neurological: Denies dizziness, seizures, syncope, weakness, light-headedness, numbness and headaches.  Hematological: Denies adenopathy. Easy bruising, personal or family bleeding history  Psychiatric/Behavioral: Denies suicidal ideation, mood changes, confusion, nervousness, sleep disturbance and agitation   Assessment and Plan:  Attention deficit hyperactivity disorder (ADHD), unspecified ADHD type  Memory loss -Currently being worked up by neurology who has recommended MRI and sleep study. -Of note TSH  and B12 have been normal. -Per recommendation of neurology, will start  Adderall today.  Low-dose 10 mg daily for now, will schedule follow-up in 3 months.  SOB (shortness of breath) -Per patient significantly improved at this time.     I discussed the assessment and treatment plan with the patient. The patient was provided an opportunity to ask questions and all were answered. The patient agreed with the plan and demonstrated an understanding of the instructions.   The patient was advised to call back or seek an in-person evaluation if the symptoms worsen or if the condition fails to improve as anticipated.  I provided 24 minutes of non-face-to-face time during this encounter.   [name], MD [facility] at [facility]

## 2018-11-28 NOTE — Telephone Encounter (Signed)
Patient had a telephone visit 11/27/2018

## 2018-11-29 ENCOUNTER — Telehealth: Payer: Self-pay | Admitting: Internal Medicine

## 2018-11-29 NOTE — Telephone Encounter (Signed)
Copied from CRM [id number]. Topic: Quick Communication - See Telephone Encounter >> Nov 29, 2018  3:27 PM [name] wrote: CRM for notification. See Telephone encounter for: 11/29/18.  Pt called in to ask, pt is currently prescribed amphetamine-dextroamphetamine. Pt says that his current strength XR is causing some abdominal discomfort. Pt would like to know if he could switch to the standard release instead and take 10MG  in the morning and 5MG  in the afternoon?   Please advise.   Pharmacy: [phone] [place], [place] - [address] [phone] (Phone) [phone] (Fax)

## 2018-11-30 ENCOUNTER — Other Ambulatory Visit: Payer: Self-pay | Admitting: Internal Medicine

## 2018-11-30 DIAGNOSIS — F909 Attention-deficit hyperactivity disorder, unspecified type: Secondary | ICD-10-CM

## 2018-11-30 MED ORDER — AMPHETAMINE-DEXTROAMPHETAMINE 10 MG PO TABS: 10.0000 mg | ORAL_TABLET | Freq: Two times a day (BID) | ORAL | 0 refills | Status: DC

## 2018-11-30 NOTE — Telephone Encounter (Signed)
Will DC 10 mg XR. New Rx generated for 10 mg once daily for 30 days. He should keep [place] updated on his response for side effects and to consider dose escalation if needed.

## 2018-12-03 NOTE — Telephone Encounter (Signed)
Spoke with patient and recommendations were given.

## 2018-12-04 NOTE — Telephone Encounter (Signed)
Adderall 10 mg was filled at [facility] [place].  Adderall XR 10 mg was filled at [facility].  The remaining prescription for Adderalll XR 10 will not be filled.

## 2018-12-12 ENCOUNTER — Telehealth: Payer: Self-pay | Admitting: Internal Medicine

## 2018-12-12 NOTE — Telephone Encounter (Signed)
Copied from CRM [id number]. Topic: Quick Communication - Rx Refill/Question >> Dec 12, 2018  1:32 PM William Romero, NT wrote: Medication:  amphetamine-dextroamphetamine (ADDERALL) 10 MG tablet  Has the patient contacted their pharmacy? Yes, patient stated he no longer has any refills and that the prescription has been affective and he would like to continue using it.   Preferred Pharmacy (with phone number or street name):  [id number] [place], [place] - [address] [phone] (Phone) [phone] (Fax)  Agent: Please be advised that RX refills may take up to 3 business days. We ask that you follow-up with your pharmacy.

## 2018-12-15 ENCOUNTER — Other Ambulatory Visit: Payer: Self-pay | Admitting: Internal Medicine

## 2018-12-15 DIAGNOSIS — F909 Attention-deficit hyperactivity disorder, unspecified type: Secondary | ICD-10-CM

## 2018-12-15 MED ORDER — AMPHETAMINE-DEXTROAMPHETAMINE 10 MG PO TABS: 10.0000 mg | ORAL_TABLET | Freq: Every day | ORAL | 0 refills | Status: DC

## 2018-12-28 ENCOUNTER — Telehealth: Payer: Self-pay | Admitting: Internal Medicine

## 2018-12-28 NOTE — Telephone Encounter (Signed)
Please schedule e visit to discuss his request. Thanks.

## 2018-12-28 NOTE — Telephone Encounter (Signed)
Patient called because he would like his directions for his adderall to be 1 tablet twice a day, please advise

## 2018-12-31 ENCOUNTER — Telehealth: Payer: Self-pay | Admitting: Internal Medicine

## 2018-12-31 NOTE — Telephone Encounter (Signed)
Copied from CRM [id number]. Topic: Quick Communication - Rx Refill/Question >> Dec 31, 2018  3:43 PM Mcneil, Ja-Kwan wrote: Medication: amphetamine-dextroamphetamine (ADDERALL) 10 MG tablet   - Pt stated his dosage was increased to twice daily so he only received a 15 day supply  Has the patient contacted their pharmacy? no  Preferred Pharmacy (with phone number or street name): [id number] [place], [place] - [address] [phone] (Phone)  [phone] (Fax)  Agent: Please be advised that RX refills may take up to 3 business days. We ask that you follow-up with your pharmacy.

## 2019-01-01 ENCOUNTER — Ambulatory Visit: Payer: Self-pay | Admitting: Internal Medicine

## 2019-01-01 ENCOUNTER — Ambulatory Visit (INDEPENDENT_AMBULATORY_CARE_PROVIDER_SITE_OTHER): Payer: Self-pay | Admitting: Internal Medicine

## 2019-01-01 ENCOUNTER — Other Ambulatory Visit: Payer: Self-pay

## 2019-01-01 DIAGNOSIS — F909 Attention-deficit hyperactivity disorder, unspecified type: Secondary | ICD-10-CM

## 2019-01-01 MED ORDER — AMPHETAMINE-DEXTROAMPHETAMINE 10 MG PO TABS: 10.0000 mg | ORAL_TABLET | Freq: Two times a day (BID) | ORAL | 0 refills | Status: DC

## 2019-01-01 NOTE — Progress Notes (Signed)
Virtual Visit via Telephone Note  I connected with William Romero on 01/01/19 at 10:00 AM EDT by telephone and verified that I am speaking with the correct person using two identifiers.   I discussed the limitations, risks, security and privacy concerns of performing an evaluation and management service by telephone and the availability of in person appointments. I also discussed with the patient that there may be a patient responsible charge related to this service. The patient expressed understanding and agreed to proceed.  Location patient: home Location provider: work office Participants present for the call: patient, provider Patient did not have a visit in the prior 7 days to address this/these issue(s).   History of Present Illness:  He scheduled this visit due to his adderall running out. He has been taking it BID instead of QD, which is why he ran out early. He states he is doing fairly well on it, but believes he is "developing a tolerance" to it. Would like to get 60 tabs a month instead of 30 tabs.   Observations/Objective: Patient sounds cheerful and well on the phone. I do not appreciate any increased work of breathing. Speech and thought processing are grossly intact. Patient reported vitals: none reported   Current Outpatient Medications:  .  albuterol (PROVENTIL HFA;VENTOLIN HFA) 108 (90 Base) MCG/ACT inhaler, Inhale 2 puffs into the lungs every 6 (six) hours as needed for wheezing or shortness of breath. (Patient not taking: Reported on 11/02/2018), Disp: 1 Inhaler, Rfl: 0 .  amphetamine-dextroamphetamine (ADDERALL) 10 MG tablet, Take 1 tablet (10 mg total) by mouth 2 (two) times daily with a meal., Disp: 60 tablet, Rfl: 0 .  amphetamine-dextroamphetamine (ADDERALL) 10 MG tablet, Take 1 tablet (10 mg total) by mouth 2 (two) times daily., Disp: 60 tablet, Rfl: 0 .  amphetamine-dextroamphetamine (ADDERALL) 10 MG tablet, Take 1 tablet (10 mg total) by mouth 2 (two)  times daily., Disp: 60 tablet, Rfl: 0  Review of Systems:  Constitutional: Denies fever, chills, diaphoresis, appetite change and fatigue.  HEENT: Denies photophobia, eye pain, redness, hearing loss, ear pain, congestion, sore throat, rhinorrhea, sneezing, mouth sores, trouble swallowing, neck pain, neck stiffness and tinnitus.   Respiratory: Denies SOB, DOE, cough, chest tightness,  and wheezing.   Cardiovascular: Denies chest pain, palpitations and leg swelling.  Gastrointestinal: Denies nausea, vomiting, abdominal pain, diarrhea, constipation, blood in stool and abdominal distention.  Genitourinary: Denies dysuria, urgency, frequency, hematuria, flank pain and difficulty urinating.  Endocrine: Denies: hot or cold intolerance, sweats, changes in hair or nails, polyuria, polydipsia. Musculoskeletal: Denies myalgias, back pain, joint swelling, arthralgias and gait problem.  Skin: Denies pallor, rash and wound.  Neurological: Denies dizziness, seizures, syncope, weakness, light-headedness, numbness and headaches.  Hematological: Denies adenopathy. Easy bruising, personal or family bleeding history  Psychiatric/Behavioral: Denies suicidal ideation, mood changes, confusion, nervousness, sleep disturbance and agitation   Assessment and Plan:  Attention deficit hyperactivity disorder (ADHD), unspecified ADHD type -Agree to increase Adderall to 10 mg BID, for #60 tabs a month. -3 month refills have been provided today.   I discussed the assessment and treatment plan with the patient. The patient was provided an opportunity to ask questions and all were answered. The patient agreed with the plan and demonstrated an understanding of the instructions.   The patient was advised to call back or seek an in-person evaluation if the symptoms worsen or if the condition fails to improve as anticipated.  I provided 12 minutes of non-face-to-face time during  this encounter.   [name],  MD [facility]

## 2019-01-01 NOTE — Telephone Encounter (Signed)
Telephone visit scheduled

## 2019-01-22 ENCOUNTER — Ambulatory Visit: Payer: 59 | Admitting: Neurology

## 2019-01-29 ENCOUNTER — Telehealth: Payer: Self-pay | Admitting: Internal Medicine

## 2019-01-29 NOTE — Telephone Encounter (Signed)
Copied from CRM [id number]. Topic: Quick Communication - Rx Refill/Question >> Jan 29, 2019 11:44 AM Reggie Pile, NT wrote: Medication:  amphetamine-dextroamphetamine (ADDERALL) 10 MG tablet  Has the patient contacted their pharmacy? Yes patient has no more refills  Preferred Pharmacy (with phone number or street name):  [id number] [place], [place] - [address] [phone] (Phone) [phone] (Fax)  Agent: Please be advised that RX refills may take up to 3 business days. We ask that you follow-up with your pharmacy.

## 2019-01-29 NOTE — Telephone Encounter (Signed)
Spoke with pharmacist and Rx will be ready 01/31/2019.  Explained to patient that refills have to be called in by the patient.

## 2019-02-27 ENCOUNTER — Ambulatory Visit: Payer: Self-pay | Admitting: Internal Medicine

## 2019-03-01 ENCOUNTER — Telehealth: Payer: Self-pay | Admitting: Internal Medicine

## 2019-03-01 DIAGNOSIS — F909 Attention-deficit hyperactivity disorder, unspecified type: Secondary | ICD-10-CM

## 2019-03-01 NOTE — Telephone Encounter (Signed)
Medication Refill - Medication: amphetamine-dextroamphetamine (ADDERALL) 10 MG tablet  Has the patient contacted their pharmacy? Yes - would like to know if he can increase dosage, feels he is building up a tolerance to it at this time. (Agent: If no, request that the patient contact the pharmacy for the refill.) (Agent: If yes, when and what did the pharmacy advise?)  Preferred Pharmacy (with phone number or street name): [id number] [place], [place] - [address] [phone] (Phone) [phone] (Fax)     Agent: Please be advised that RX refills may take up to 3 business days. We ask that you follow-up with your pharmacy.

## 2019-03-01 NOTE — Telephone Encounter (Signed)
Refills denied.  Patient no show appointment for 02/28/2019.  Patient needs an office visit.

## 2019-03-28 ENCOUNTER — Telehealth: Payer: Self-pay | Admitting: Internal Medicine

## 2019-03-28 ENCOUNTER — Ambulatory Visit (INDEPENDENT_AMBULATORY_CARE_PROVIDER_SITE_OTHER): Payer: Self-pay | Admitting: Internal Medicine

## 2019-03-28 ENCOUNTER — Other Ambulatory Visit: Payer: Self-pay

## 2019-03-28 DIAGNOSIS — F900 Attention-deficit hyperactivity disorder, predominantly inattentive type: Secondary | ICD-10-CM

## 2019-03-28 DIAGNOSIS — F909 Attention-deficit hyperactivity disorder, unspecified type: Secondary | ICD-10-CM | POA: Insufficient documentation

## 2019-03-28 MED ORDER — AMPHETAMINE-DEXTROAMPHETAMINE 10 MG PO TABS: ORAL_TABLET | ORAL | 0 refills | Status: DC

## 2019-03-28 MED ORDER — AMPHETAMINE-DEXTROAMPHET ER 20 MG PO CP24: 20.0000 mg | ORAL_CAPSULE | ORAL | 0 refills | Status: DC

## 2019-03-28 NOTE — Telephone Encounter (Signed)
[facility] [place] [facility] an E-scribe prescription amphetamine-dextroamphetamine (ADDERALL XR) 20 MG 24 hr capsule, but the patient stated he didn't think the prescription was suppose to be extended release. Please advise.

## 2019-03-28 NOTE — Progress Notes (Signed)
Virtual Visit via Video Note  I connected with William Romero on 03/28/19 at 10:00 AM EDT by a video enabled telemedicine application and verified that I am speaking with the correct person using two identifiers.  Location patient: home Location provider: work office Persons participating in the virtual visit: patient, provider  I discussed the limitations of evaluation and management by telemedicine and the availability of in person appointments. The patient expressed understanding and agreed to proceed.   HPI: This visit is for medication refills. He is on Adderall 10 mg BID for his ADHD. Since starting he has noticed that his "mind is quicker, he can concentrate better and word finding has improved". He however, feels like he is developing a "tolerance" to it as the am dose effect wears off before it is time for his second dose. Other than this, no acute issues to discuss with me today.   ROS: Constitutional: Denies fever, chills, diaphoresis, appetite change and fatigue.  HEENT: Denies photophobia, eye pain, redness, hearing loss, ear pain, congestion, sore throat, rhinorrhea, sneezing, mouth sores, trouble swallowing, neck pain, neck stiffness and tinnitus.   Respiratory: Denies SOB, DOE, cough, chest tightness,  and wheezing.   Cardiovascular: Denies chest pain, palpitations and leg swelling.  Gastrointestinal: Denies nausea, vomiting, abdominal pain, diarrhea, constipation, blood in stool and abdominal distention.  Genitourinary: Denies dysuria, urgency, frequency, hematuria, flank pain and difficulty urinating.  Endocrine: Denies: hot or cold intolerance, sweats, changes in hair or nails, polyuria, polydipsia. Musculoskeletal: Denies myalgias, back pain, joint swelling, arthralgias and gait problem.  Skin: Denies pallor, rash and wound.  Neurological: Denies dizziness, seizures, syncope, weakness, light-headedness, numbness and headaches.  Hematological: Denies  adenopathy. Easy bruising, personal or family bleeding history  Psychiatric/Behavioral: Denies suicidal ideation, mood changes, confusion, nervousness, sleep disturbance and agitation   Past Medical History:  Diagnosis Date  . Schizophrenia [facility])     Past Surgical History:  Procedure Laterality Date  . APPENDECTOMY      No family history on file.  SOCIAL HX:   reports that he has quit smoking. His smoking use included cigarettes. He smoked 0.50 packs per day. He has never used smokeless tobacco. He reports current alcohol use of about 5.0 standard drinks of alcohol per week. He reports that he does not use drugs.   Current Outpatient Medications:  .  albuterol (PROVENTIL HFA;VENTOLIN HFA) 108 (90 Base) MCG/ACT inhaler, Inhale 2 puffs into the lungs every 6 (six) hours as needed for wheezing or shortness of breath. (Patient not taking: Reported on 11/02/2018), Disp: 1 Inhaler, Rfl: 0 .  amphetamine-dextroamphetamine (ADDERALL XR) 20 MG 24 hr capsule, Take 1 capsule (20 mg total) by mouth every morning., Disp: 30 capsule, Rfl: 0 .  amphetamine-dextroamphetamine (ADDERALL XR) 20 MG 24 hr capsule, Take 1 capsule (20 mg total) by mouth every morning., Disp: 30 capsule, Rfl: 0 .  amphetamine-dextroamphetamine (ADDERALL XR) 20 MG 24 hr capsule, Take 1 capsule (20 mg total) by mouth every morning., Disp: 30 capsule, Rfl: 0 .  amphetamine-dextroamphetamine (ADDERALL) 10 MG tablet, Take 1 tablet no later than 3 pm if needed, Disp: 30 tablet, Rfl: 0 .  amphetamine-dextroamphetamine (ADDERALL) 10 MG tablet, Take 1 tablet no later than 3 pm if needed, Disp: 30 tablet, Rfl: 0 .  amphetamine-dextroamphetamine (ADDERALL) 10 MG tablet, Take 1 tablet no later than 3 pm if needed, Disp: 30 tablet, Rfl: 0  EXAM:   VITALS per patient if applicable: none reported  GENERAL: alert, oriented,  appears well and in no acute distress  HEENT: atraumatic, conjunttiva clear, no obvious abnormalities on inspection  of external nose and ears  NECK: normal movements of the head and neck  LUNGS: on inspection no signs of respiratory distress, breathing rate appears normal, no obvious gross increased work of breathing, gasping or wheezing  CV: no obvious cyanosis  MS: moves all visible extremities without noticeable abnormality  PSYCH/NEURO: pleasant and cooperative, no obvious depression or anxiety, speech and thought processing grossly intact  ASSESSMENT AND PLAN:   Attention deficit hyperactivity disorder (ADHD), predominantly inattentive type  -Will increase am dose from 10 to 20 mg and make it extended release to see if it helps. -May still take a second 10 mg dose before 3 pm if needed. -He will follow up in 3 months.    I discussed the assessment and treatment plan with the patient. The patient was provided an opportunity to ask questions and all were answered. The patient agreed with the plan and demonstrated an understanding of the instructions.   The patient was advised to call back or seek an in-person evaluation if the symptoms worsen or if the condition fails to improve as anticipated.    William JanEstela Hernandez Acosta, MD  [facility]

## 2019-03-28 NOTE — Telephone Encounter (Signed)
Yes, it is 20 mg XR in the am with an additional 10 mg (non XR) in the afternoon PRN.

## 2019-03-29 NOTE — Telephone Encounter (Signed)
Pharmacy notified of rx clarification.

## 2019-04-18 ENCOUNTER — Ambulatory Visit: Payer: 59 | Admitting: Internal Medicine

## 2019-04-26 ENCOUNTER — Other Ambulatory Visit: Payer: Self-pay | Admitting: Internal Medicine

## 2019-04-26 MED ORDER — AMPHETAMINE-DEXTROAMPHETAMINE 20 MG PO TABS: 20.0000 mg | ORAL_TABLET | Freq: Every day | ORAL | 0 refills | Status: DC

## 2019-04-26 NOTE — Telephone Encounter (Signed)
Spoke with pharmacist and Adderall XR 20 prescriptions have been cancelled.

## 2019-04-26 NOTE — Telephone Encounter (Signed)
Ok to send same dose immediate release

## 2019-04-26 NOTE — Telephone Encounter (Signed)
See request for regular Adderall; reported the XR hurts his stomach.

## 2019-04-26 NOTE — Telephone Encounter (Signed)
Medication Refill - Medication:amphetamine-dextroamphetamine (ADDERALL XR) 20 MG 24 hr capsule [[id number] And the 10 mg.  He would like the regular adderall for both not the XR . He stated this on hurts his stomach    Has the patient contacted their pharmacy? No. (Agent: If no, request that the patient contact the pharmacy for the refill.) (Agent: If yes, when and what did the pharmacy advise?)  [facility] (with phone number [facility] [address]   Agent: Please be advised that RX refills may take up to 3 business days. We ask that you follow-up with your pharmacy.

## 2019-06-26 ENCOUNTER — Telehealth: Payer: Self-pay | Admitting: Internal Medicine

## 2019-06-26 ENCOUNTER — Telehealth: Payer: Self-pay

## 2019-06-26 NOTE — Telephone Encounter (Signed)
Medication Refill - Medication:  amphetamine-dextroamphetamine (ADDERALL) 10 MG tablet [[id number]] amphetamine-dextroamphetamine (ADDERALL) 20 MG tablet   Has the patient contacted their pharmacy? No. (Agent: If no, request that the patient contact the pharmacy for the refill.) (Agent: If yes, when and what did the pharmacy advise?)  Preferred Pharmacy (with phone number or street name):  [id number] [place], [place] - [address] [phone] (Phone     Agent: Please be advised that RX refills may take up to 3 business days. We ask that you follow-up with your pharmacy.

## 2019-06-26 NOTE — Telephone Encounter (Signed)
Needs visit for refill. Can be virtual.

## 2019-06-26 NOTE — Telephone Encounter (Signed)
Virtual visit scheduled.  

## 2019-06-26 NOTE — Telephone Encounter (Signed)
Appointment scheduled.

## 2019-06-26 NOTE — Telephone Encounter (Signed)
Copied from [place] [id number]. Topic: General - Inquiry >> Jun 26, 2019  1:30 PM Alease Frame wrote: Reason for CRM: patient called in to see why he would need a follow up appt in order to obtain medication refill  Call back number [phone]

## 2019-06-28 ENCOUNTER — Other Ambulatory Visit: Payer: Self-pay

## 2019-06-28 ENCOUNTER — Telehealth (INDEPENDENT_AMBULATORY_CARE_PROVIDER_SITE_OTHER): Payer: Self-pay | Admitting: Internal Medicine

## 2019-06-28 DIAGNOSIS — F900 Attention-deficit hyperactivity disorder, predominantly inattentive type: Secondary | ICD-10-CM

## 2019-06-28 MED ORDER — AMPHETAMINE-DEXTROAMPHETAMINE 20 MG PO TABS: ORAL_TABLET | ORAL | 0 refills | Status: DC

## 2019-06-28 NOTE — Progress Notes (Signed)
Virtual Visit via Telephone Note  I connected with William Romero on 06/28/19 at  2:00 PM EDT by telephone and verified that I am speaking with the correct person using two identifiers.   I discussed the limitations, risks, security and privacy concerns of performing an evaluation and management service by telephone and the availability of in person appointments. I also discussed with the patient that there may be a patient responsible charge related to this service. The patient expressed understanding and agreed to proceed.  Location patient: home Location provider: work office Participants present for the call: patient, provider Patient did not have a visit in the prior 7 days to address this/these issue(s).   History of Present Illness:  He is due for Adderall refills for treatment of his ADHD. He feels like the current dose of 20 mg in am and 10 in pm works well for him. He has lost his insurance and wonders if I could prescribe #45 of the 20 mg instead for him to take 1 tab q am and 0.5 tab q pm for half the copay. He has no other complaints.   Observations/Objective: Patient sounds cheerful and well on the phone. I do not appreciate any increased work of breathing. Speech and thought processing are grossly intact. Patient reported vitals: none reported   Current Outpatient Medications:  .  albuterol (PROVENTIL HFA;VENTOLIN HFA) 108 (90 Base) MCG/ACT inhaler, Inhale 2 puffs into the lungs every 6 (six) hours as needed for wheezing or shortness of breath. (Patient not taking: Reported on 11/02/2018), Disp: 1 Inhaler, Rfl: 0 .  amphetamine-dextroamphetamine (ADDERALL) 20 MG tablet, Take one tab in the morning and half tab in the evening, Disp: 45 tablet, Rfl: 0 .  amphetamine-dextroamphetamine (ADDERALL) 20 MG tablet, Take one tab in the morning and half tab in the evening, Disp: 45 tablet, Rfl: 0 .  amphetamine-dextroamphetamine (ADDERALL) 20 MG tablet, Take one tab in the  morning and half tab in the evening, Disp: 45 tablet, Rfl: 0  Review of Systems:  Constitutional: Denies fever, chills, diaphoresis, appetite change and fatigue.  HEENT: Denies photophobia, eye pain, redness, hearing loss, ear pain, congestion, sore throat, rhinorrhea, sneezing, mouth sores, trouble swallowing, neck pain, neck stiffness and tinnitus.   Respiratory: Denies SOB, DOE, cough, chest tightness,  and wheezing.   Cardiovascular: Denies chest pain, palpitations and leg swelling.  Gastrointestinal: Denies nausea, vomiting, abdominal pain, diarrhea, constipation, blood in stool and abdominal distention.  Genitourinary: Denies dysuria, urgency, frequency, hematuria, flank pain and difficulty urinating.  Endocrine: Denies: hot or cold intolerance, sweats, changes in hair or nails, polyuria, polydipsia. Musculoskeletal: Denies myalgias, back pain, joint swelling, arthralgias and gait problem.  Skin: Denies pallor, rash and wound.  Neurological: Denies dizziness, seizures, syncope, weakness, light-headedness, numbness and headaches.  Hematological: Denies adenopathy. Easy bruising, personal or family bleeding history  Psychiatric/Behavioral: Denies suicidal ideation, mood changes, confusion, nervousness, sleep disturbance and agitation   Assessment and Plan:  Attention deficit hyperactivity disorder (ADHD), predominantly inattentive type -PDMP reviewed, no red flags. -Will refill Adderall 20 mg #45 tabs x 3 months.   I discussed the assessment and treatment plan with the patient. The patient was provided an opportunity to ask questions and all were answered. The patient agreed with the plan and demonstrated an understanding of the instructions.   The patient was advised to call back or seek an in-person evaluation if the symptoms worsen or if the condition fails to improve as anticipated.  I provided  12 minutes of non-face-to-face time during this encounter.   [name], MD [facility] at [facility]

## 2019-07-25 ENCOUNTER — Telehealth: Payer: Self-pay | Admitting: Internal Medicine

## 2019-07-25 NOTE — Telephone Encounter (Signed)
rx refill amphetamine-dextroamphetamine (ADDERALL) 20 MG tablet  [facility]Y [id number] - [place], [place] - [address]

## 2019-07-25 NOTE — Telephone Encounter (Signed)
Left detailed message on machine for patient to call not text pharmacy for refills.

## 2019-09-25 ENCOUNTER — Telehealth: Payer: Self-pay | Admitting: Internal Medicine

## 2019-09-25 NOTE — Telephone Encounter (Signed)
Left message on machine for patient to schedule an appointment for follow up medications.  This can be a virtual/telephone visit.

## 2019-09-25 NOTE — Telephone Encounter (Signed)
Medication Refill - Medication: amphetamine-dextroamphetamine (ADDERALL) 20 MG tablet   Has the patient contacted their pharmacy? No. (Agent: If no, request that the patient contact the pharmacy for the refill.) (Agent: If yes, when and what did the pharmacy advise?)  Preferred Pharmacy (with phone number or street name):  [id number] [place], [place] - [address] [place] [address] Phone:  [phone]  Fax:  [phone]       Agent: Please be advised that RX refills may take up to 3 business days. We ask that you follow-up with your pharmacy.

## 2019-09-27 ENCOUNTER — Other Ambulatory Visit: Payer: Self-pay

## 2019-09-27 ENCOUNTER — Telehealth (INDEPENDENT_AMBULATORY_CARE_PROVIDER_SITE_OTHER): Payer: Self-pay | Admitting: Internal Medicine

## 2019-09-27 ENCOUNTER — Telehealth: Payer: Self-pay | Admitting: Internal Medicine

## 2019-09-27 DIAGNOSIS — F900 Attention-deficit hyperactivity disorder, predominantly inattentive type: Secondary | ICD-10-CM

## 2019-09-27 MED ORDER — AMPHETAMINE-DEXTROAMPHETAMINE 20 MG PO TABS: ORAL_TABLET | ORAL | 0 refills | Status: DC

## 2019-09-27 NOTE — Telephone Encounter (Signed)
Telephone visit scheduled

## 2019-09-27 NOTE — Telephone Encounter (Signed)
Patient needs a refill on Adderall 20 mg 35 tablets.  He is completely out.   Patient is requesting generic prescription for that.  He is awaiting a call back to talk to you about it.   [facility] [place] [facility] [address]

## 2019-09-27 NOTE — Progress Notes (Signed)
Virtual Visit via Telephone Note  I connected with William Romero on 09/27/19 at  3:30 PM EST by telephone and verified that I am speaking with the correct person using two identifiers.   I discussed the limitations, risks, security and privacy concerns of performing an evaluation and management service by telephone and the availability of in person appointments. I also discussed with the patient that there may be a patient responsible charge related to this service. The patient expressed understanding and agreed to proceed.  Location patient: home Location provider: work office Participants present for the call: patient, provider Patient did not have a visit in the prior 7 days to address this/these issue(s).   History of Present Illness:  He has scheduled this visit for his 35-month Adderall refills as scheduled.  He is tolerating the 20 mg dose without issues.  He knows he needs to come in in the next 3 to 6 months for an in person appointment for his CPE.  He has no acute complaints today   Observations/Objective: Patient sounds cheerful and well on the phone. I do not appreciate any increased work of breathing. Speech and thought processing are grossly intact. Patient reported vitals: None reported   Current Outpatient Medications:  .  albuterol (PROVENTIL HFA;VENTOLIN HFA) 108 (90 Base) MCG/ACT inhaler, Inhale 2 puffs into the lungs every 6 (six) hours as needed for wheezing or shortness of breath. (Patient not taking: Reported on 11/02/2018), Disp: 1 Inhaler, Rfl: 0 .  amphetamine-dextroamphetamine (ADDERALL) 20 MG tablet, Take one tab in the morning and half tab in the evening, Disp: 45 tablet, Rfl: 0 .  amphetamine-dextroamphetamine (ADDERALL) 20 MG tablet, Take one tab in the morning and half tab in the evening, Disp: 45 tablet, Rfl: 0 .  amphetamine-dextroamphetamine (ADDERALL) 20 MG tablet, Take one tab in the morning and half tab in the evening, Disp: 45 tablet, Rfl:  0  Review of Systems:  Constitutional: Denies fever, chills, diaphoresis, appetite change and fatigue.  HEENT: Denies photophobia, eye pain, redness, hearing loss, ear pain, congestion, sore throat, rhinorrhea, sneezing, mouth sores, trouble swallowing, neck pain, neck stiffness and tinnitus.   Respiratory: Denies SOB, DOE, cough, chest tightness,  and wheezing.   Cardiovascular: Denies chest pain, palpitations and leg swelling.  Gastrointestinal: Denies nausea, vomiting, abdominal pain, diarrhea, constipation, blood in stool and abdominal distention.  Genitourinary: Denies dysuria, urgency, frequency, hematuria, flank pain and difficulty urinating.  Endocrine: Denies: hot or cold intolerance, sweats, changes in hair or nails, polyuria, polydipsia. Musculoskeletal: Denies myalgias, back pain, joint swelling, arthralgias and gait problem.  Skin: Denies pallor, rash and wound.  Neurological: Denies dizziness, seizures, syncope, weakness, light-headedness, numbness and headaches.  Hematological: Denies adenopathy. Easy bruising, personal or family bleeding history  Psychiatric/Behavioral: Denies suicidal ideation, mood changes, confusion, nervousness, sleep disturbance and agitation   Assessment and Plan:  Attention deficit hyperactivity disorder (ADHD), predominantly inattentive type  -PDMP reviewed, no red flags, overdose risk score 0. -Refill Adderall x3 months.   I discussed the assessment and treatment plan with the patient. The patient was provided an opportunity to ask questions and all were answered. The patient agreed with the plan and demonstrated an understanding of the instructions.   The patient was advised to call back or seek an in-person evaluation if the symptoms worsen or if the condition fails to improve as anticipated.  I provided 14 minutes of non-face-to-face time during this encounter.   [name], MD [facility]

## 2019-12-09 ENCOUNTER — Telehealth: Payer: Self-pay | Admitting: Internal Medicine

## 2019-12-09 NOTE — Telephone Encounter (Signed)
Last 3 Rxs given on 1/22

## 2019-12-09 NOTE — Telephone Encounter (Signed)
amphetamine-dextroamphetamine (ADDERALL) 20 MG tablet   [id number] - [place], [place] - [address] Phone:  [phone]  Fax:  [phone]

## 2019-12-10 NOTE — Telephone Encounter (Signed)
Left message on machine for patient to return our call.  Patient needs visit for refills - can be virtual.

## 2019-12-10 NOTE — Telephone Encounter (Signed)
Needs OV for refills (can be virtual)

## 2019-12-10 NOTE — Telephone Encounter (Signed)
Appointment scheduled.

## 2019-12-12 ENCOUNTER — Telehealth (INDEPENDENT_AMBULATORY_CARE_PROVIDER_SITE_OTHER): Payer: Self-pay | Admitting: Internal Medicine

## 2019-12-12 ENCOUNTER — Other Ambulatory Visit: Payer: Self-pay

## 2019-12-12 DIAGNOSIS — F900 Attention-deficit hyperactivity disorder, predominantly inattentive type: Secondary | ICD-10-CM

## 2019-12-12 MED ORDER — AMPHETAMINE-DEXTROAMPHETAMINE 20 MG PO TABS: ORAL_TABLET | ORAL | 0 refills | Status: DC

## 2019-12-12 NOTE — Progress Notes (Signed)
Virtual Visit via Telephone Note  I connected with William Romero on 12/12/19 at  4:00 PM EDT by telephone and verified that I am speaking with the correct person using two identifiers.   I discussed the limitations, risks, security and privacy concerns of performing an evaluation and management service by telephone and the availability of in person appointments. I also discussed with the patient that there may be a patient responsible charge related to this service. The patient expressed understanding and agreed to proceed.  Location patient: home Location provider: work office Participants present for the call: patient, provider Patient did not have a visit in the prior 7 days to address this/these issue(s).   History of Present Illness:  This is a scheduled visit for Adderall refills per controlled substance contract. He has been doing well since we last spoke. Has had no untoward side effects of the medications. He uses 20 mg tablets and takes one in the am and a half in the pm. This seems to work well for him. No changes since we last spoke. He is not interested in receiving the COVID vaccine at this time.     Observations/Objective: Patient sounds cheerful and well on the phone. I do not appreciate any increased work of breathing. Speech and thought processing are grossly intact. Patient reported vitals: none reported   Current Outpatient Medications:  .  albuterol (PROVENTIL HFA;VENTOLIN HFA) 108 (90 Base) MCG/ACT inhaler, Inhale 2 puffs into the lungs every 6 (six) hours as needed for wheezing or shortness of breath., Disp: 1 Inhaler, Rfl: 0 .  amphetamine-dextroamphetamine (ADDERALL) 20 MG tablet, Take one tab in the morning and half tab in the evening, Disp: 45 tablet, Rfl: 0 .  amphetamine-dextroamphetamine (ADDERALL) 20 MG tablet, Take one tab in the morning and half tab in the evening, Disp: 45 tablet, Rfl: 0 .  amphetamine-dextroamphetamine (ADDERALL) 20 MG  tablet, Take one tab in the morning and half tab in the evening, Disp: 45 tablet, Rfl: 0  Review of Systems:  Constitutional: Denies fever, chills, diaphoresis, appetite change and fatigue.  HEENT: Denies photophobia, eye pain, redness, hearing loss, ear pain, congestion, sore throat, rhinorrhea, sneezing, mouth sores, trouble swallowing, neck pain, neck stiffness and tinnitus.   Respiratory: Denies SOB, DOE, cough, chest tightness,  and wheezing.   Cardiovascular: Denies chest pain, palpitations and leg swelling.  Gastrointestinal: Denies nausea, vomiting, abdominal pain, diarrhea, constipation, blood in stool and abdominal distention.  Genitourinary: Denies dysuria, urgency, frequency, hematuria, flank pain and difficulty urinating.  Endocrine: Denies: hot or cold intolerance, sweats, changes in hair or nails, polyuria, polydipsia. Musculoskeletal: Denies myalgias, back pain, joint swelling, arthralgias and gait problem.  Skin: Denies pallor, rash and wound.  Neurological: Denies dizziness, seizures, syncope, weakness, light-headedness, numbness and headaches.  Hematological: Denies adenopathy. Easy bruising, personal or family bleeding history  Psychiatric/Behavioral: Denies suicidal ideation, mood changes, confusion, nervousness, sleep disturbance and agitation   Assessment and Plan:  Attention deficit hyperactivity disorder (ADHD), predominantly inattentive type  -PDMP reviewed, no red flags. ORS 0. -Refill Adderall 20 mg #45 tabs per month for 3 months.   I discussed the assessment and treatment plan with the patient. The patient was provided an opportunity to ask questions and all were answered. The patient agreed with the plan and demonstrated an understanding of the instructions.   The patient was advised to call back or seek an in-person evaluation if the symptoms worsen or if the condition fails to improve as anticipated.  I provided 13 minutes of non-face-to-face time during  this encounter.   Lelon Frohlich, MD [facility] at [facility]

## 2020-01-16 ENCOUNTER — Telehealth: Payer: Self-pay | Admitting: Internal Medicine

## 2020-01-16 NOTE — Telephone Encounter (Signed)
Pt call and want a refill on amphetamine-dextroamphetamine (ADDERALL) 20 MG tablet sent to  [id number] [place], [place] - [address] AT [place] [address] Phone:  [phone]  Fax:  [phone]

## 2020-01-16 NOTE — Telephone Encounter (Signed)
Refill will be available 01/17/20 at [facility] where he picked up his last prescription 12/21/19.

## 2020-01-17 ENCOUNTER — Telehealth: Payer: Self-pay | Admitting: Internal Medicine

## 2020-01-17 NOTE — Telephone Encounter (Signed)
Patient needs refill on Aderrall.

## 2020-01-17 NOTE — Telephone Encounter (Signed)
Refill available at [facility]

## 2020-02-13 ENCOUNTER — Telehealth: Payer: Self-pay | Admitting: Internal Medicine

## 2020-02-13 NOTE — Telephone Encounter (Signed)
Pt call and want a refill on amphetamine-dextroamphetamine (ADDERALL) 20 MG tablet sent to  [id number] [place], [place] - [address] Phone:  [phone]  Fax:  [phone]       

## 2020-02-18 NOTE — Telephone Encounter (Signed)
He should be good until July as he had 3 month refills in April. Make sure he schedules a VV before he runs out in July.

## 2020-02-19 NOTE — Telephone Encounter (Signed)
Prescription was picked up 02/16/20 from [facility].  No refills available.  Patient will need an office visit. 

## 2020-03-12 ENCOUNTER — Telehealth: Payer: Self-pay | Admitting: Internal Medicine

## 2020-03-12 NOTE — Telephone Encounter (Signed)
Prescription was picked up 02/16/20 from [facility].  No refills available.  Patient will need an office visit.

## 2020-03-12 NOTE — Telephone Encounter (Signed)
Pt call and need a refill on amphetamine-dextroamphetamine (ADDERALL) 20 MG table sent to  [id number] [place], [place] - [address] AT [place] [address] Phone:  [phone]  Fax:  [phone]

## 2020-03-18 ENCOUNTER — Telehealth (INDEPENDENT_AMBULATORY_CARE_PROVIDER_SITE_OTHER): Payer: Self-pay | Admitting: Internal Medicine

## 2020-03-18 ENCOUNTER — Other Ambulatory Visit: Payer: Self-pay

## 2020-03-18 DIAGNOSIS — F900 Attention-deficit hyperactivity disorder, predominantly inattentive type: Secondary | ICD-10-CM

## 2020-03-18 MED ORDER — AMPHETAMINE-DEXTROAMPHETAMINE 20 MG PO TABS: ORAL_TABLET | ORAL | 0 refills | Status: DC

## 2020-03-18 NOTE — Progress Notes (Signed)
Virtual Visit via Telephone Note  I connected with William Romero on 03/18/20 at 11:30 AM EDT by telephone and verified that I am speaking with the correct person using two identifiers.   I discussed the limitations, risks, security and privacy concerns of performing an evaluation and management service by telephone and the availability of in person appointments. I also discussed with the patient that there may be a patient responsible charge related to this service. The patient expressed understanding and agreed to proceed.  Location patient: home Location provider: work office Participants present for the call: patient, provider Patient did not have a visit in the prior 7 days to address this/these issue(s).   History of Present Illness:  This is a scheduled visit for Adderall refills per contract.  He is prescribed 20 mg of Adderall up which he takes 1 tablet in the morning and then 1/2 tablet in the evenings for a total of 45 pills/month.  He is doing well on this prescribed dose and has had no changes in his health since then.  He is due for his annual physical.   Observations/Objective: Patient sounds cheerful and well on the phone. I do not appreciate any increased work of breathing. Speech and thought processing are grossly intact. Patient reported vitals: None reported   Current Outpatient Medications:  .  albuterol (PROVENTIL HFA;VENTOLIN HFA) 108 (90 Base) MCG/ACT inhaler, Inhale 2 puffs into the lungs every 6 (six) hours as needed for wheezing or shortness of breath., Disp: 1 Inhaler, Rfl: 0 .  amphetamine-dextroamphetamine (ADDERALL) 20 MG tablet, Take one tab in the morning and half tab in the evening, Disp: 45 tablet, Rfl: 0 .  amphetamine-dextroamphetamine (ADDERALL) 20 MG tablet, Take one tab in the morning and half tab in the evening, Disp: 45 tablet, Rfl: 0 .  amphetamine-dextroamphetamine (ADDERALL) 20 MG tablet, Take one tab in the morning and half tab in  the evening, Disp: 45 tablet, Rfl: 0  Review of Systems:  Constitutional: Denies fever, chills, diaphoresis, appetite change and fatigue.  HEENT: Denies photophobia, eye pain, redness, hearing loss, ear pain, congestion, sore throat, rhinorrhea, sneezing, mouth sores, trouble swallowing, neck pain, neck stiffness and tinnitus.   Respiratory: Denies SOB, DOE, cough, chest tightness,  and wheezing.   Cardiovascular: Denies chest pain, palpitations and leg swelling.  Gastrointestinal: Denies nausea, vomiting, abdominal pain, diarrhea, constipation, blood in stool and abdominal distention.  Genitourinary: Denies dysuria, urgency, frequency, hematuria, flank pain and difficulty urinating.  Endocrine: Denies: hot or cold intolerance, sweats, changes in hair or nails, polyuria, polydipsia. Musculoskeletal: Denies myalgias, back pain, joint swelling, arthralgias and gait problem.  Skin: Denies pallor, rash and wound.  Neurological: Denies dizziness, seizures, syncope, weakness, light-headedness, numbness and headaches.  Hematological: Denies adenopathy. Easy bruising, personal or family bleeding history  Psychiatric/Behavioral: Denies suicidal ideation, mood changes, confusion, nervousness, sleep disturbance and agitation   Assessment and Plan:  Attention deficit hyperactivity disorder (ADHD), predominantly inattentive type  -PDMP reviewed, no red flags, overdose risk score of 0. -Refill Adderall 20 mg to take 1 tablet in the morning and half tablet in the afternoon for total of 45 tablets/month for 3 months.  He will schedule return visit in 3 months for his CPE.   I discussed the assessment and treatment plan with the patient. The patient was provided an opportunity to ask questions and all were answered. The patient agreed with the plan and demonstrated an understanding of the instructions.   The patient was advised  to call back or seek an in-person evaluation if the symptoms worsen or if the  condition fails to improve as anticipated.  I provided 13 minutes of non-face-to-face time during this encounter.   [name], MD [facility] at [facility]

## 2020-03-20 ENCOUNTER — Telehealth: Payer: Self-pay | Admitting: Internal Medicine

## 2020-05-12 ENCOUNTER — Telehealth: Payer: Self-pay | Admitting: Internal Medicine

## 2020-05-12 NOTE — Telephone Encounter (Signed)
Pt is requesting a medication refill of   amphetamine-dextroamphetamine (ADDERALL) 20 MG tablet   [id number] - [place], [place] - [address] AT [facility] [address]  [address] [place], [place] [address]  Phone:  [phone] Fax:  [phone]

## 2020-05-14 NOTE — Telephone Encounter (Signed)
Spoke with the pharmacist and last refill was 04/18/20.  Next prescription will be filled 05/19/20.

## 2020-06-11 ENCOUNTER — Encounter: Payer: Self-pay | Admitting: Internal Medicine

## 2020-06-15 ENCOUNTER — Telehealth: Payer: Self-pay | Admitting: Internal Medicine

## 2020-06-15 NOTE — Telephone Encounter (Signed)
Pt call and need a refill on amphetamine-dextroamphetamine (ADDERALL) 20 MG tablet sent to  [id number] - [place], [place] - [address] Phone:  [phone]  Fax:  [phone]

## 2020-06-16 NOTE — Telephone Encounter (Signed)
Pt is calling in to check the status of the Rx

## 2020-06-17 NOTE — Telephone Encounter (Signed)
Needs appointment for medication refills. Once he makes appointment, ok to refill enough to last until then.

## 2020-06-18 ENCOUNTER — Other Ambulatory Visit: Payer: Self-pay | Admitting: Internal Medicine

## 2020-06-18 DIAGNOSIS — F900 Attention-deficit hyperactivity disorder, predominantly inattentive type: Secondary | ICD-10-CM

## 2020-06-18 MED ORDER — AMPHETAMINE-DEXTROAMPHETAMINE 20 MG PO TABS: ORAL_TABLET | ORAL | 0 refills | Status: DC

## 2020-06-18 NOTE — Telephone Encounter (Signed)
Unable to leave a message at the pts cell number due to voicemail being full. 

## 2020-06-18 NOTE — Telephone Encounter (Signed)
Message sent to PCP for refills as patient has an appt on 11/10.

## 2020-06-18 NOTE — Telephone Encounter (Signed)
Refills for 1 month sent

## 2020-07-15 ENCOUNTER — Telehealth (INDEPENDENT_AMBULATORY_CARE_PROVIDER_SITE_OTHER): Payer: Self-pay | Admitting: Internal Medicine

## 2020-07-15 ENCOUNTER — Encounter: Payer: Self-pay | Admitting: Internal Medicine

## 2020-07-15 ENCOUNTER — Other Ambulatory Visit: Payer: Self-pay

## 2020-07-15 DIAGNOSIS — F900 Attention-deficit hyperactivity disorder, predominantly inattentive type: Secondary | ICD-10-CM

## 2020-07-15 MED ORDER — AMPHETAMINE-DEXTROAMPHETAMINE 20 MG PO TABS: ORAL_TABLET | ORAL | 0 refills | Status: DC

## 2020-07-15 NOTE — Progress Notes (Signed)
Virtual Visit via Telephone Note  I connected with William Romero on 07/15/20 at  3:00 PM EST by telephone and verified that I am speaking with the correct person using two identifiers.   I discussed the limitations, risks, security and privacy concerns of performing an evaluation and management service by telephone and the availability of in person appointments. I also discussed with the patient that there may be a patient responsible charge related to this service. The patient expressed understanding and agreed to proceed.  Location patient: home Location provider: work office Participants present for the call: patient, provider Patient did not have a visit in the prior 7 days to address this/these issue(s).   History of Present Illness:  William Romero has scheduled this visit today for the purpose of his Adderall refills.  He has his physical scheduled for January.  He knows to come in fasting.  He has no complaints today.   Observations/Objective: Patient sounds cheerful and well on the phone. I do not appreciate any increased work of breathing. Speech and thought processing are grossly intact. Patient reported vitals: None reported   Current Outpatient Medications:  .  albuterol (PROVENTIL HFA;VENTOLIN HFA) 108 (90 Base) MCG/ACT inhaler, Inhale 2 puffs into the lungs every 6 (six) hours as needed for wheezing or shortness of breath., Disp: 1 Inhaler, Rfl: 0 .  amphetamine-dextroamphetamine (ADDERALL) 20 MG tablet, Take one tab in the morning and half tab in the evening, Disp: 45 tablet, Rfl: 0 .  amphetamine-dextroamphetamine (ADDERALL) 20 MG tablet, Take one tab in the morning and half tab in the evening, Disp: 45 tablet, Rfl: 0 .  amphetamine-dextroamphetamine (ADDERALL) 20 MG tablet, Take one tab in the morning and half tab in the evening, Disp: 45 tablet, Rfl: 0  Review of Systems:  Constitutional: Denies fever, chills, diaphoresis, appetite change and fatigue.  HEENT:  Denies photophobia, eye pain, redness, hearing loss, ear pain, congestion, sore throat, rhinorrhea, sneezing, mouth sores, trouble swallowing, neck pain, neck stiffness and tinnitus.   Respiratory: Denies SOB, DOE, cough, chest tightness,  and wheezing.   Cardiovascular: Denies chest pain, palpitations and leg swelling.  Gastrointestinal: Denies nausea, vomiting, abdominal pain, diarrhea, constipation, blood in stool and abdominal distention.  Genitourinary: Denies dysuria, urgency, frequency, hematuria, flank pain and difficulty urinating.  Endocrine: Denies: hot or cold intolerance, sweats, changes in hair or nails, polyuria, polydipsia. Musculoskeletal: Denies myalgias, back pain, joint swelling, arthralgias and gait problem.  Skin: Denies pallor, rash and wound.  Neurological: Denies dizziness, seizures, syncope, weakness, light-headedness, numbness and headaches.  Hematological: Denies adenopathy. Easy bruising, personal or family bleeding history  Psychiatric/Behavioral: Denies suicidal ideation, mood changes, confusion, nervousness, sleep disturbance and agitation   Assessment and Plan:  Attention deficit hyperactivity disorder (ADHD), predominantly inattentive type -PDMP reviewed, no red flags, overdose risk score is 0. -Refill Adderall 20 mg that he takes 1 tablet in the morning and half tablet in the evening for 45 tablets a month x3 months.  He has his physical scheduled for January.   I discussed the assessment and treatment plan with the patient. The patient was provided an opportunity to ask questions and all were answered. The patient agreed with the plan and demonstrated an understanding of the instructions.   The patient was advised to call back or seek an in-person evaluation if the symptoms worsen or if the condition fails to improve as anticipated.  I provided 12 minutes of non-face-to-face time during this encounter.   William Jan,  MD [facility]

## 2020-08-11 IMAGING — DX DG CHEST 2V
2 series · 2 of 2 positions shown · non-contrast
Comparison: 11/22/2017

CLINICAL DATA: Dyspnea

EXAM:
CHEST - 2 VIEW

[chest pa]
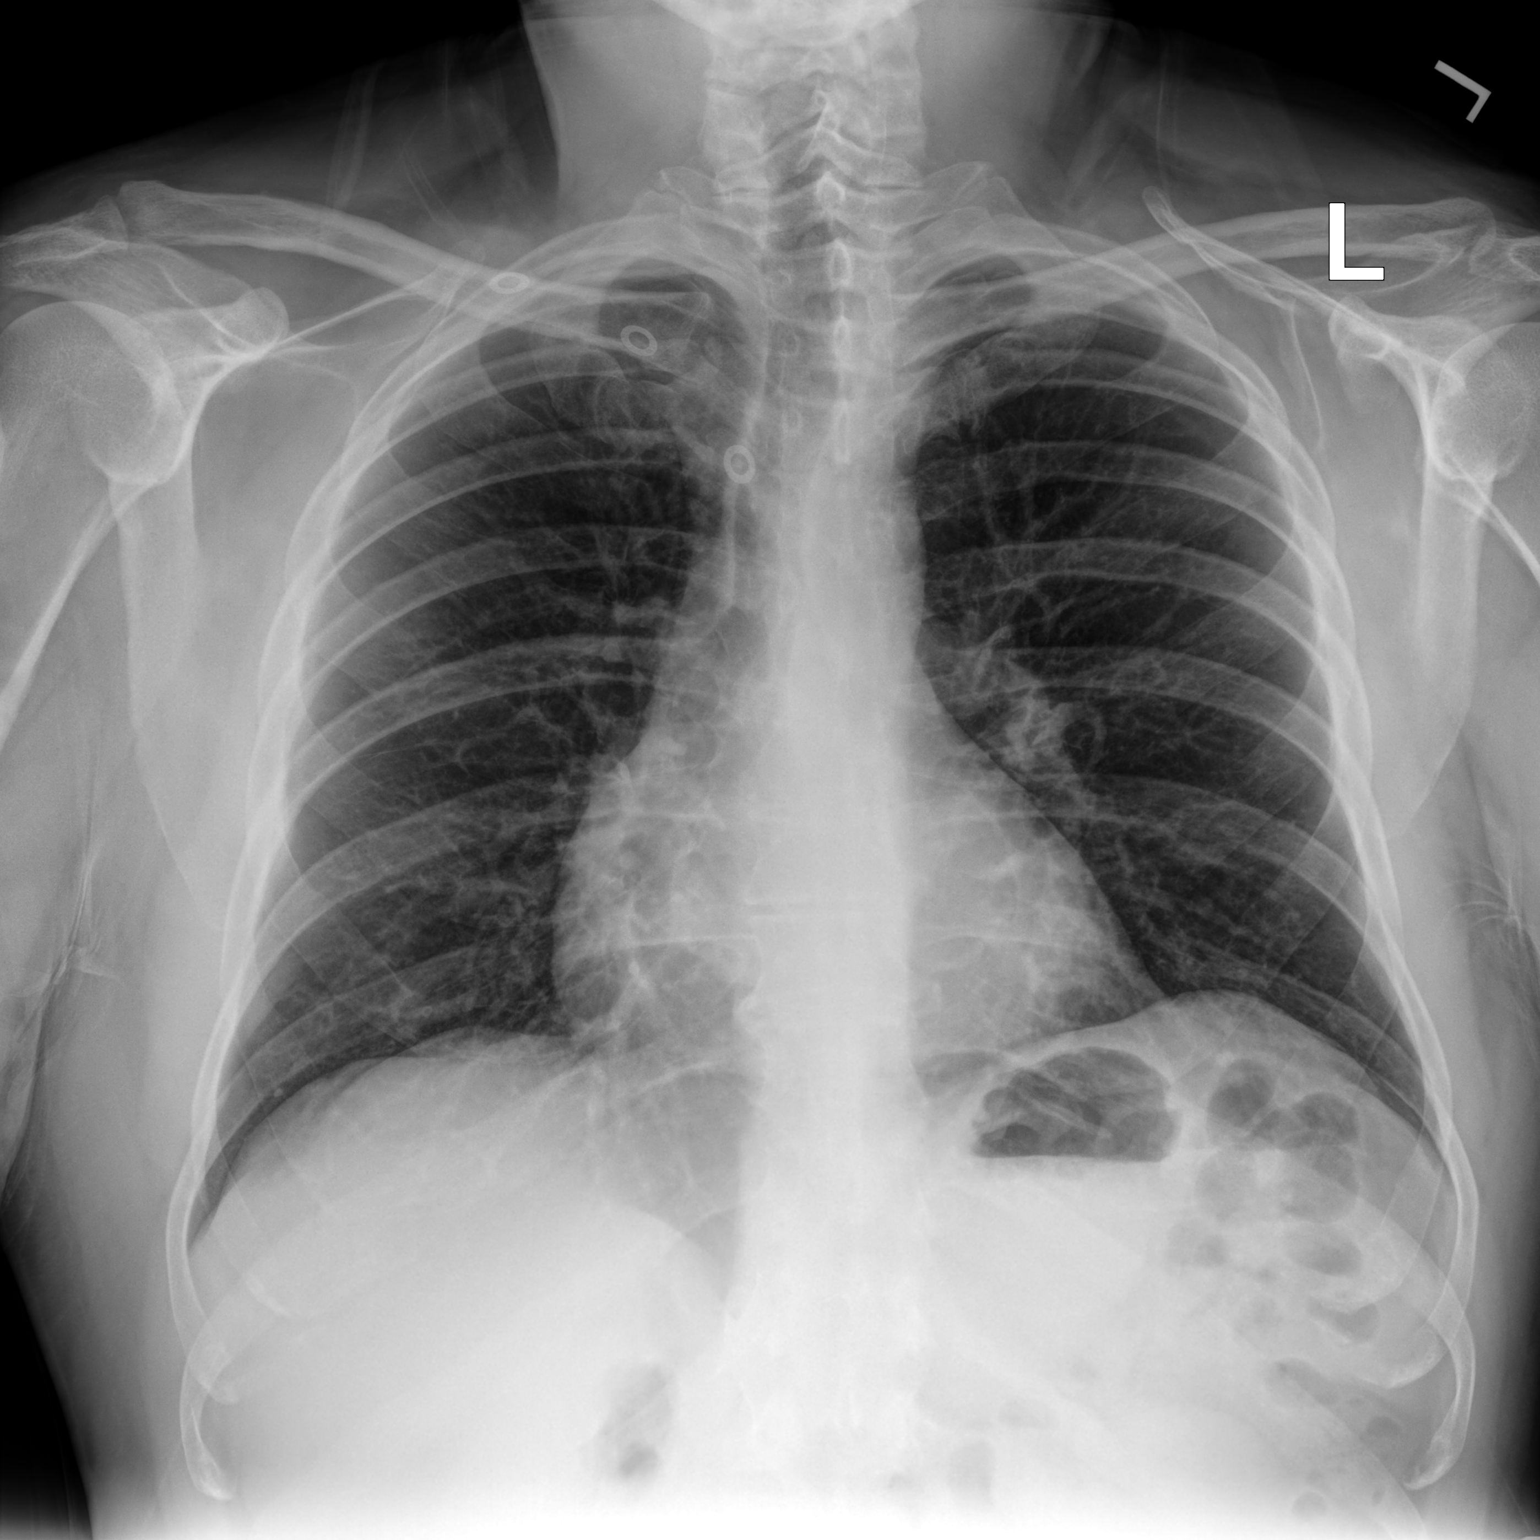

[chest lat]
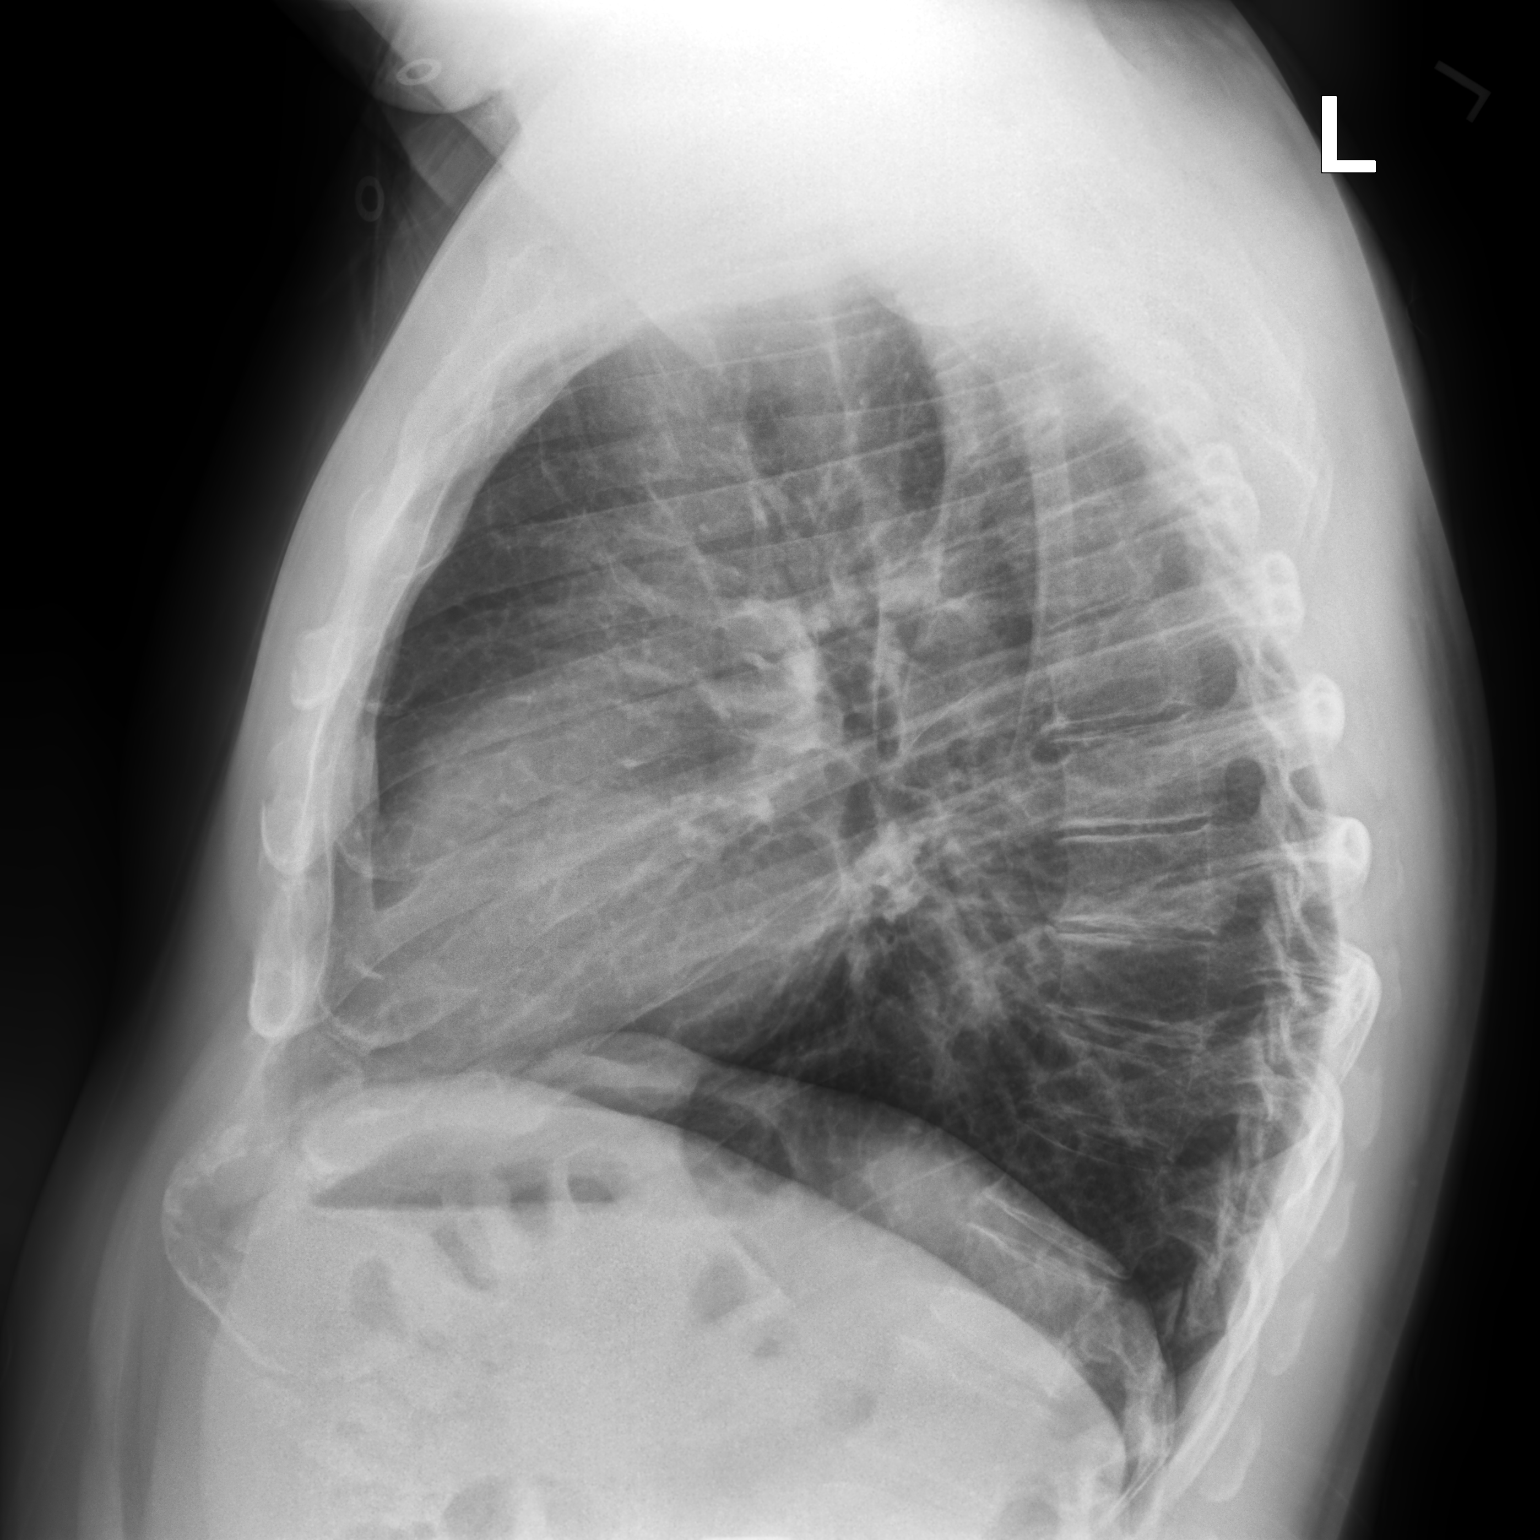

[2 of 2 positions shown; findings below may reference images not displayed]

FINDINGS: The heart size and mediastinal contours are within normal limits.
Both lungs are clear. The visualized skeletal structures are
unremarkable. Rounded washer like densities project over the right
lung apex as before of uncertain etiology, question body piercings
or tattoo artifacts.
IMPRESSION: No active cardiopulmonary disease.

## 2020-09-09 ENCOUNTER — Encounter: Payer: Self-pay | Admitting: Internal Medicine

## 2020-09-09 ENCOUNTER — Ambulatory Visit (INDEPENDENT_AMBULATORY_CARE_PROVIDER_SITE_OTHER): Payer: Self-pay | Admitting: Internal Medicine

## 2020-09-09 ENCOUNTER — Other Ambulatory Visit: Payer: Self-pay

## 2020-09-09 DIAGNOSIS — F900 Attention-deficit hyperactivity disorder, predominantly inattentive type: Secondary | ICD-10-CM

## 2020-09-09 MED ORDER — AMPHETAMINE-DEXTROAMPHETAMINE 20 MG PO TABS: ORAL_TABLET | ORAL | 0 refills | Status: DC

## 2020-09-09 MED ORDER — AMPHETAMINE-DEXTROAMPHETAMINE 20 MG PO TABS
ORAL_TABLET | ORAL | 0 refills | Status: DC
Start: 2020-09-09 — End: 2020-12-03

## 2020-09-09 NOTE — Progress Notes (Signed)
Virtual Visit via Telephone Note  I connected with William Romero on 09/09/20 at  9:30 AM EST by telephone and verified that I am speaking with the correct person using two identifiers.   I discussed the limitations, risks, security and privacy concerns of performing an evaluation and management service by telephone and the availability of in person appointments. I also discussed with the patient that there may be a patient responsible charge related to this service. The patient expressed understanding and agreed to proceed.  Location patient: home Location provider: work office Participants present for the call: patient, provider Patient did not have a visit in the prior 7 days to address this/these issue(s).   History of Present Illness:  This visit has been scheduled for the purpose of his Adderall refills.  He is prescribed 20 mg tablets for which he takes 1 tablet in the morning and an extra half tablet in the evening if needed.  He uses the half tablet pretty consistently.  He is tolerating medication well and has no untoward side effects   Observations/Objective: Patient sounds cheerful and well on the phone. I do not appreciate any increased work of breathing. Speech and thought processing are grossly intact. Patient reported vitals: None reported   Current Outpatient Medications:  .  albuterol (PROVENTIL HFA;VENTOLIN HFA) 108 (90 Base) MCG/ACT inhaler, Inhale 2 puffs into the lungs every 6 (six) hours as needed for wheezing or shortness of breath., Disp: 1 Inhaler, Rfl: 0 .  amphetamine-dextroamphetamine (ADDERALL) 20 MG tablet, Take one William in the morning and half William in the evening, Disp: 45 tablet, Rfl: 0 .  amphetamine-dextroamphetamine (ADDERALL) 20 MG tablet, Take one William in the morning and half William in the evening, Disp: 45 tablet, Rfl: 0 .  amphetamine-dextroamphetamine (ADDERALL) 20 MG tablet, Take one William in the morning and half William in the evening, Disp: 45  tablet, Rfl: 0  Review of Systems:  Constitutional: Denies fever, chills, diaphoresis, appetite change and fatigue.  HEENT: Denies photophobia, eye pain, redness, hearing loss, ear pain, congestion, sore throat, rhinorrhea, sneezing, mouth sores, trouble swallowing, neck pain, neck stiffness and tinnitus.   Respiratory: Denies SOB, DOE, cough, chest tightness,  and wheezing.   Cardiovascular: Denies chest pain, palpitations and leg swelling.  Gastrointestinal: Denies nausea, vomiting, abdominal pain, diarrhea, constipation, blood in stool and abdominal distention.  Genitourinary: Denies dysuria, urgency, frequency, hematuria, flank pain and difficulty urinating.  Endocrine: Denies: hot or cold intolerance, sweats, changes in hair or nails, polyuria, polydipsia. Musculoskeletal: Denies myalgias, back pain, joint swelling, arthralgias and gait problem.  Skin: Denies pallor, rash and wound.  Neurological: Denies dizziness, seizures, syncope, weakness, light-headedness, numbness and headaches.  Hematological: Denies adenopathy. Easy bruising, personal or family bleeding history  Psychiatric/Behavioral: Denies suicidal ideation, mood changes, confusion, nervousness, sleep disturbance and agitation   Assessment and Plan:  Attention deficit hyperactivity disorder (ADHD), predominantly inattentive type -PDMP reviewed, no red flags, overdose risk score is 0. -I will refill Adderall 20 mg for 45 tablets a month x3 months.   I discussed the assessment and treatment plan with the patient. The patient was provided an opportunity to ask questions and all were answered. The patient agreed with the plan and demonstrated an understanding of the instructions.   The patient was advised to call back or seek an in-person evaluation if the symptoms worsen or if the condition fails to improve as anticipated.  I provided 12 minutes of non-face-to-face time during this encounter.  [name],  MD [facility]

## 2020-11-02 ENCOUNTER — Other Ambulatory Visit: Payer: Self-pay | Admitting: Internal Medicine

## 2020-11-02 DIAGNOSIS — F900 Attention-deficit hyperactivity disorder, predominantly inattentive type: Secondary | ICD-10-CM

## 2020-11-02 NOTE — Telephone Encounter (Signed)
Pt is calling in needing a refill on Rx amphetamine-dextroamphetamine (ADDERALL) 20 MG  [facility] [address]

## 2020-11-04 NOTE — Telephone Encounter (Signed)
Last filled 10/08/20.  Please deny,  1 refill remains

## 2020-12-02 ENCOUNTER — Telehealth: Payer: Self-pay | Admitting: Internal Medicine

## 2020-12-02 NOTE — Telephone Encounter (Signed)
Patient is calling and requesting refill for amphetamine-dextroamphetamine (ADDERALL) 20 MG [facility] [place], please advise. CB is [phone]

## 2020-12-02 NOTE — Telephone Encounter (Signed)
Looks like he will be due for visit/refills by 4/5.

## 2020-12-02 NOTE — Telephone Encounter (Signed)
Pt is calling to check the status of the below msg and he was informed of it and stated that he will call the pharmacy to see if he does b/c the last time that he went to pick it up the pharmacy told him that he only had one and not sure if that is the one at the pharmacy or if the one he picked up last month was the last one.

## 2020-12-02 NOTE — Telephone Encounter (Signed)
Patient has a refill at [facility]

## 2020-12-02 NOTE — Telephone Encounter (Signed)
Spoke with pharmacist at [facility] and no refills are available.  Appointment scheduled for tomorrow.

## 2020-12-02 NOTE — Telephone Encounter (Signed)
The patients next refill is not till April 5. I have the patient scheduled for 12/03/2020 for a medication follow up with Dr. Ardyth Harps.  Nothing further needed.

## 2020-12-03 ENCOUNTER — Encounter: Payer: Self-pay | Admitting: Internal Medicine

## 2020-12-03 ENCOUNTER — Ambulatory Visit (INDEPENDENT_AMBULATORY_CARE_PROVIDER_SITE_OTHER): Payer: Self-pay | Admitting: Internal Medicine

## 2020-12-03 ENCOUNTER — Other Ambulatory Visit: Payer: Self-pay

## 2020-12-03 VITALS — BP 110/80 | HR 92 | Temp 97.5°F | Wt 211.5 lb

## 2020-12-03 DIAGNOSIS — F900 Attention-deficit hyperactivity disorder, predominantly inattentive type: Secondary | ICD-10-CM

## 2020-12-03 MED ORDER — AMPHETAMINE-DEXTROAMPHETAMINE 20 MG PO TABS
ORAL_TABLET | ORAL | 0 refills | Status: DC
Start: 2020-12-03 — End: 2021-05-18

## 2020-12-03 MED ORDER — AMPHETAMINE-DEXTROAMPHETAMINE 20 MG PO TABS: ORAL_TABLET | ORAL | 0 refills | Status: DC

## 2020-12-03 NOTE — Progress Notes (Signed)
Established Patient Office Visit     This visit occurred during the SARS-CoV-2 public health emergency.  Safety protocols were in place, including screening questions prior to the visit, additional usage of staff PPE, and extensive cleaning of exam room while observing appropriate contact time as indicated for disinfecting solutions.    CC/Reason for Visit: Adderall refills  HPI: William Romero is a 40 y.o. male who is coming in today for the above mentioned reasons. Past Medical History is significant for: ADHD on Adderall 20 mg of which she takes 1 tablet in the morning and half tablet in the afternoon.  He is here today for refills.  He is overdue for annual physical, however he tells me that he would prefer to delay this as he has no medical insurance at this time.  He has been doing well and has no acute complaints.   Past Medical/Surgical History: Past Medical History:  Diagnosis Date  . Schizophrenia [facility])     Past Surgical History:  Procedure Laterality Date  . APPENDECTOMY      Social History:  reports that he has quit smoking. His smoking use included cigarettes. He smoked 0.50 packs per day. He has never used smokeless tobacco. He reports current alcohol use of about 5.0 standard drinks of alcohol per week. He reports that he does not use drugs.  Allergies: No Known Allergies  Family History:  No history of heart disease, cancer, stroke that he is aware of  Current Outpatient Medications:  .  amphetamine-dextroamphetamine (ADDERALL) 20 MG tablet, Take one tab in the morning and half tab in the evening, Disp: 30 tablet, Rfl: 0 .  amphetamine-dextroamphetamine (ADDERALL) 20 MG tablet, Take one tab in the morning and half tab in the evening, Disp: 45 tablet, Rfl: 0 .  amphetamine-dextroamphetamine (ADDERALL) 20 MG tablet, Take one tab in the morning and half tab in the evening, Disp: 45 tablet, Rfl: 0 .  amphetamine-dextroamphetamine (ADDERALL) 20 MG tablet,  Take one tab in the morning and half tab in the evening, Disp: 15 tablet, Rfl: 0  Review of Systems:  Constitutional: Denies fever, chills, diaphoresis, appetite change and fatigue.  HEENT: Denies photophobia, eye pain, redness, hearing loss, ear pain, congestion, sore throat, rhinorrhea, sneezing, mouth sores, trouble swallowing, neck pain, neck stiffness and tinnitus.   Respiratory: Denies SOB, DOE, cough, chest tightness,  and wheezing.   Cardiovascular: Denies chest pain, palpitations and leg swelling.  Gastrointestinal: Denies nausea, vomiting, abdominal pain, diarrhea, constipation, blood in stool and abdominal distention.  Genitourinary: Denies dysuria, urgency, frequency, hematuria, flank pain and difficulty urinating.  Endocrine: Denies: hot or cold intolerance, sweats, changes in hair or nails, polyuria, polydipsia. Musculoskeletal: Denies myalgias, back pain, joint swelling, arthralgias and gait problem.  Skin: Denies pallor, rash and wound.  Neurological: Denies dizziness, seizures, syncope, weakness, light-headedness, numbness and headaches.  Hematological: Denies adenopathy. Easy bruising, personal or family bleeding history  Psychiatric/Behavioral: Denies suicidal ideation, mood changes, confusion, nervousness, sleep disturbance and agitation    Physical Exam: Vitals:   12/03/20 0744  BP: 110/80  Pulse: 92  Temp: (!) 97.5 F (36.4 C)  TempSrc: Oral  SpO2: 98%  Weight: 211 lb 8 oz (95.9 kg)    Body mass index is 27.15 kg/m.   Constitutional: NAD, calm, comfortable Eyes: PERRL, lids and conjunctivae normal ENMT: Mucous membranes are moist.  Respiratory: clear to auscultation bilaterally, no wheezing, no crackles. Normal respiratory effort. No accessory muscle use.  Cardiovascular: Regular rate and  rhythm, no murmurs / rubs / gallops. No extremity edema. 2+ pedal pulses. Neurologic: Grossly intact and nonfocal. Psychiatric: Normal judgment and insight. Alert and  oriented x 3. Normal mood.    Impression and Plan:  Attention deficit hyperactivity disorder (ADHD), predominantly inattentive type - Plan: amphetamine-dextroamphetamine (ADDERALL) 20 MG tablet, amphetamine-dextroamphetamine (ADDERALL) 20 MG tablet, amphetamine-dextroamphetamine (ADDERALL) 20 MG tablet, amphetamine-dextroamphetamine (ADDERALL) 20 MG tablet -PDMP reviewed, no red flags, overdose or score 0. -Refill Adderall 20 mg to take 1 tablet in the morning and half a tablet in the afternoon for a total of 45 tablets a month x3 months. -He has signed a controlled substance contract today.    [name], MD [facility]

## 2021-02-24 ENCOUNTER — Other Ambulatory Visit: Payer: Self-pay

## 2021-02-24 ENCOUNTER — Encounter: Payer: Self-pay | Admitting: Internal Medicine

## 2021-02-24 ENCOUNTER — Telehealth (INDEPENDENT_AMBULATORY_CARE_PROVIDER_SITE_OTHER): Payer: Self-pay | Admitting: Internal Medicine

## 2021-02-24 VITALS — Wt 215.0 lb

## 2021-02-24 DIAGNOSIS — F900 Attention-deficit hyperactivity disorder, predominantly inattentive type: Secondary | ICD-10-CM

## 2021-02-24 MED ORDER — AMPHETAMINE-DEXTROAMPHETAMINE 20 MG PO TABS
ORAL_TABLET | ORAL | 0 refills | Status: DC
Start: 2021-02-24 — End: 2021-05-18

## 2021-02-24 MED ORDER — AMPHETAMINE-DEXTROAMPHETAMINE 20 MG PO TABS: ORAL_TABLET | ORAL | 0 refills | Status: DC

## 2021-02-24 NOTE — Progress Notes (Signed)
Virtual Visit via Telephone Note  I connected with William Romero on 02/24/21 at  7:00 AM EDT by telephone and verified that I am speaking with the correct person using two identifiers.   I discussed the limitations, risks, security and privacy concerns of performing an evaluation and management service by telephone and the availability of in person appointments. I also discussed with the patient that there may be a patient responsible charge related to this service. The patient expressed understanding and agreed to proceed.  Location patient: home Location provider: work office Participants present for the call: patient, provider Patient did not have a visit in the prior 7 days to address this/these issue(s).   History of Present Illness:  He has scheduled this visit as he is due for Adderall refills per controlled substance contract.  He has been using Adderall 20 mg a full tablet in the morning and 1/2 tablet at nighttime.  Feels this dose has been working well for him.  Has not noticed excessive insomnia, elevated blood pressures.  Helps him stay on task with his job.  He is overdue for physical.   Observations/Objective: Patient sounds cheerful and well on the phone. I do not appreciate any increased work of breathing. Speech and thought processing are grossly intact. Patient reported vitals: None reported   Current Outpatient Medications:    amphetamine-dextroamphetamine (ADDERALL) 20 MG tablet, Take one tab in the morning and half tab in the evening, Disp: 15 tablet, Rfl: 0   amphetamine-dextroamphetamine (ADDERALL) 20 MG tablet, Take one tab in the morning and half tab in the evening, Disp: 45 tablet, Rfl: 0   amphetamine-dextroamphetamine (ADDERALL) 20 MG tablet, Take one tab in the morning and half tab in the evening, Disp: 45 tablet, Rfl: 0   amphetamine-dextroamphetamine (ADDERALL) 20 MG tablet, Take one tab in the morning and half tab in the evening, Disp: 45  tablet, Rfl: 0  Review of Systems:  Constitutional: Denies fever, chills, diaphoresis, appetite change and fatigue.  HEENT: Denies photophobia, eye pain, redness, hearing loss, ear pain, congestion, sore throat, rhinorrhea, sneezing, mouth sores, trouble swallowing, neck pain, neck stiffness and tinnitus.   Respiratory: Denies SOB, DOE, cough, chest tightness,  and wheezing.   Cardiovascular: Denies chest pain, palpitations and leg swelling.  Gastrointestinal: Denies nausea, vomiting, abdominal pain, diarrhea, constipation, blood in stool and abdominal distention.  Genitourinary: Denies dysuria, urgency, frequency, hematuria, flank pain and difficulty urinating.  Endocrine: Denies: hot or cold intolerance, sweats, changes in hair or nails, polyuria, polydipsia. Musculoskeletal: Denies myalgias, back pain, joint swelling, arthralgias and gait problem.  Skin: Denies pallor, rash and wound.  Neurological: Denies dizziness, seizures, syncope, weakness, light-headedness, numbness and headaches.  Hematological: Denies adenopathy. Easy bruising, personal or family bleeding history  Psychiatric/Behavioral: Denies suicidal ideation, mood changes, confusion, nervousness, sleep disturbance and agitation   Assessment and Plan:  Attention deficit hyperactivity disorder (ADHD), predominantly inattentive type  -Unfortunately, PDMP is un-accessible today.  It appears they are doing site maintenance. -He has not had any suspicious activity on his controlled substance database report previously. -I will go ahead and refill Adderall 20 mg for a total of 45 tablets a month to take 1 tablet in the morning and half tablet in the afternoon for a total of 3 months.   I discussed the assessment and treatment plan with the patient. The patient was provided an opportunity to ask questions and all were answered. The patient agreed with the plan and demonstrated an understanding  of the instructions.   The patient was  advised to call back or seek an in-person evaluation if the symptoms worsen or if the condition fails to improve as anticipated.  I provided 13 minutes of non-face-to-face time during this encounter.   [name], MD [facility]

## 2021-04-27 ENCOUNTER — Other Ambulatory Visit: Payer: Self-pay | Admitting: Internal Medicine

## 2021-04-27 DIAGNOSIS — F900 Attention-deficit hyperactivity disorder, predominantly inattentive type: Secondary | ICD-10-CM

## 2021-04-27 NOTE — Telephone Encounter (Signed)
Pt call and stated he want to cancel the refill at [facility] and want it sent to [facility] [address].

## 2021-04-28 MED ORDER — AMPHETAMINE-DEXTROAMPHETAMINE 20 MG PO TABS: ORAL_TABLET | ORAL | 0 refills | Status: DC

## 2021-04-28 NOTE — Telephone Encounter (Signed)
Spoke to the pharmacist Olegario Messier and the last adder all refill [facility] [place].

## 2021-04-28 NOTE — Telephone Encounter (Signed)
Rx sent 

## 2021-04-28 NOTE — Telephone Encounter (Signed)
Okay to send Rx 

## 2021-05-18 ENCOUNTER — Other Ambulatory Visit: Payer: Self-pay

## 2021-05-18 ENCOUNTER — Telehealth (INDEPENDENT_AMBULATORY_CARE_PROVIDER_SITE_OTHER): Payer: Self-pay | Admitting: Internal Medicine

## 2021-05-18 DIAGNOSIS — F900 Attention-deficit hyperactivity disorder, predominantly inattentive type: Secondary | ICD-10-CM

## 2021-05-18 MED ORDER — AMPHETAMINE-DEXTROAMPHETAMINE 20 MG PO TABS: ORAL_TABLET | ORAL | 0 refills | Status: DC

## 2021-05-18 NOTE — Progress Notes (Signed)
    Virtual Visit via Telephone Note  I connected with William Romero on 05/18/21 at  4:00 PM EDT by telephone and verified that I am speaking with the correct person using two identifiers.   I discussed the limitations, risks, security and privacy concerns of performing an evaluation and management service by telephone and the availability of in person appointments. I also discussed with the patient that there may be a patient responsible charge related to this service. The patient expressed understanding and agreed to proceed.  Location patient: home Location provider: work office Participants present for the call: patient, provider Patient did not have a visit in the prior 7 days to address this/these issue(s).   History of Present Illness:  This is a scheduled visit for Adderall refills per contract.  He is on 20 mg in the morning and 10 mg in the afternoon.  He is doing well with this dose, he has had no side effects.   Observations/Objective: Patient sounds cheerful and well on the phone. I do not appreciate any increased work of breathing. Speech and thought processing are grossly intact. Patient reported vitals: None reported   Current Outpatient Medications:    amphetamine-dextroamphetamine (ADDERALL) 20 MG tablet, Take one tab in the morning and half tab in the evening, Disp: 45 tablet, Rfl: 0   amphetamine-dextroamphetamine (ADDERALL) 20 MG tablet, Take one tab in the morning and half tab in the evening, Disp: 45 tablet, Rfl: 0   amphetamine-dextroamphetamine (ADDERALL) 20 MG tablet, Take one tab in the morning and half tab in the evening, Disp: 45 tablet, Rfl: 0  Review of Systems:  Constitutional: Denies fever, chills, diaphoresis, appetite change and fatigue.  HEENT: Denies photophobia, eye pain, redness, hearing loss, ear pain, congestion, sore throat, rhinorrhea, sneezing, mouth sores, trouble swallowing, neck pain, neck stiffness and tinnitus.   Respiratory:  Denies SOB, DOE, cough, chest tightness,  and wheezing.   Cardiovascular: Denies chest pain, palpitations and leg swelling.  Gastrointestinal: Denies nausea, vomiting, abdominal pain, diarrhea, constipation, blood in stool and abdominal distention.  Genitourinary: Denies dysuria, urgency, frequency, hematuria, flank pain and difficulty urinating.  Endocrine: Denies: hot or cold intolerance, sweats, changes in hair or nails, polyuria, polydipsia. Musculoskeletal: Denies myalgias, back pain, joint swelling, arthralgias and gait problem.  Skin: Denies pallor, rash and wound.  Neurological: Denies dizziness, seizures, syncope, weakness, light-headedness, numbness and headaches.  Hematological: Denies adenopathy. Easy bruising, personal or family bleeding history  Psychiatric/Behavioral: Denies suicidal ideation, mood changes, confusion, nervousness, sleep disturbance and agitation   Assessment and Plan:  Attention deficit hyperactivity disorder (ADHD), predominantly inattentive type -PDMP reviewed, no red flags, overdose risk score is 0. -Refill Adderall 20 mg to take 1 full tablet in the morning and half tablet in the afternoon for a total of 45 tablets a month x3 months.   I discussed the assessment and treatment plan with the patient. The patient was provided an opportunity to ask questions and all were answered. The patient agreed with the plan and demonstrated an understanding of the instructions.   The patient was advised to call back or seek an in-person evaluation if the symptoms worsen or if the condition fails to improve as anticipated.  I provided 13 minutes of non-face-to-face time during this encounter.   [name], MD [facility]

## 2021-05-28 ENCOUNTER — Telehealth: Payer: Self-pay | Admitting: Internal Medicine

## 2021-08-10 ENCOUNTER — Telehealth (INDEPENDENT_AMBULATORY_CARE_PROVIDER_SITE_OTHER): Payer: Self-pay | Admitting: Internal Medicine

## 2021-08-10 DIAGNOSIS — F900 Attention-deficit hyperactivity disorder, predominantly inattentive type: Secondary | ICD-10-CM

## 2021-08-10 MED ORDER — AMPHETAMINE-DEXTROAMPHETAMINE 20 MG PO TABS: ORAL_TABLET | ORAL | 0 refills | Status: DC

## 2021-08-10 NOTE — Progress Notes (Signed)
Virtual Visit via Telephone Note  I connected with William Romero on 08/10/21 at  4:00 PM EST by telephone and verified that I am speaking with the correct person using two identifiers.   I discussed the limitations, risks, security and privacy concerns of performing an evaluation and management service by telephone and the availability of in person appointments. I also discussed with the patient that there may be a patient responsible charge related to this service. The patient expressed understanding and agreed to proceed.  Location patient: home Location provider: work office Participants present for the call: patient, provider Patient did not have a visit in the prior 7 days to address this/these issue(s).   History of Present Illness:  This visit has been scheduled for the purpose of medication refills per protocol.  He has a history of ADHD and takes Adderall 20 mg in the morning and then takes 1/2 tablet (10 mg) in the afternoon.  He has been tolerating medication well and has no acute concerns or complaints.  He tells me he will be going to get his COVID booster soon.   Observations/Objective: Patient sounds cheerful and well on the phone. I do not appreciate any increased work of breathing. Speech and thought processing are grossly intact. Patient reported vitals: None reported   Current Outpatient Medications:    amphetamine-dextroamphetamine (ADDERALL) 20 MG tablet, Take one tab in the morning and half tab in the evening, Disp: 45 tablet, Rfl: 0   amphetamine-dextroamphetamine (ADDERALL) 20 MG tablet, Take one tab in the morning and half tab in the evening, Disp: 45 tablet, Rfl: 0   amphetamine-dextroamphetamine (ADDERALL) 20 MG tablet, Take one tab in the morning and half tab in the evening, Disp: 45 tablet, Rfl: 0  Review of Systems:  Constitutional: Denies fever, chills, diaphoresis, appetite change and fatigue.  HEENT: Denies photophobia, eye pain, redness,  hearing loss, ear pain, congestion, sore throat, rhinorrhea, sneezing, mouth sores, trouble swallowing, neck pain, neck stiffness and tinnitus.   Respiratory: Denies SOB, DOE, cough, chest tightness,  and wheezing.   Cardiovascular: Denies chest pain, palpitations and leg swelling.  Gastrointestinal: Denies nausea, vomiting, abdominal pain, diarrhea, constipation, blood in stool and abdominal distention.  Genitourinary: Denies dysuria, urgency, frequency, hematuria, flank pain and difficulty urinating.  Endocrine: Denies: hot or cold intolerance, sweats, changes in hair or nails, polyuria, polydipsia. Musculoskeletal: Denies myalgias, back pain, joint swelling, arthralgias and gait problem.  Skin: Denies pallor, rash and wound.  Neurological: Denies dizziness, seizures, syncope, weakness, light-headedness, numbness and headaches.  Hematological: Denies adenopathy. Easy bruising, personal or family bleeding history  Psychiatric/Behavioral: Denies suicidal ideation, mood changes, confusion, nervousness, sleep disturbance and agitation   Assessment and Plan:  Attention deficit hyperactivity disorder (ADHD), predominantly inattentive type  -PDMP reviewed, no red flags, overdose risk score is 0. -Refill Adderall 20 mg to take a full tablet in the morning and 1/2 tablet in the afternoon for a total of 45 tablets a month x3 months.    I discussed the assessment and treatment plan with the patient. The patient was provided an opportunity to ask questions and all were answered. The patient agreed with the plan and demonstrated an understanding of the instructions.   The patient was advised to call back or seek an in-person evaluation if the symptoms worsen or if the condition fails to improve as anticipated.  I provided 12 minutes of non-face-to-face time during this encounter.   [name], MD [facility]  [facility]

## 2021-10-12 ENCOUNTER — Ambulatory Visit (INDEPENDENT_AMBULATORY_CARE_PROVIDER_SITE_OTHER): Payer: Self-pay | Admitting: Internal Medicine

## 2021-10-12 ENCOUNTER — Encounter: Payer: Self-pay | Admitting: Internal Medicine

## 2021-10-12 VITALS — BP 120/80 | HR 82 | Temp 98.1°F | Ht 72.0 in | Wt 215.9 lb

## 2021-10-12 DIAGNOSIS — F900 Attention-deficit hyperactivity disorder, predominantly inattentive type: Secondary | ICD-10-CM

## 2021-10-12 MED ORDER — AMPHETAMINE-DEXTROAMPHETAMINE 20 MG PO TABS: ORAL_TABLET | ORAL | 0 refills | Status: DC

## 2021-10-12 NOTE — Progress Notes (Signed)
Established Patient Office Visit     This visit occurred during the SARS-CoV-2 public health emergency.  Safety protocols were in place, including screening questions prior to the visit, additional usage of staff PPE, and extensive cleaning of exam room while observing appropriate contact time as indicated for disinfecting solutions.    CC/Reason for Visit: Medication refills  HPI: William Romero is a 41 y.o. male who is coming in today for the above mentioned reasons. Past Medical History is significant for: ADHD.  He has scheduled this visit for the purpose of Adderall refills per protocol.  He is prescribed 20 mg tablets and takes 1-1/2 tablets a day for total of 45 tablets a month.  He is overdue for CPE but requests deferral due to lack of medical insurance.  He promises me that he will schedule this within the next 6 months.   Past Medical/Surgical History: Past Medical History:  Diagnosis Date   Schizophrenia [facility])     Past Surgical History:  Procedure Laterality Date   APPENDECTOMY      Social History:  reports that he has quit smoking. His smoking use included cigarettes. He smoked an average of .5 packs per day. He has never used smokeless tobacco. He reports current alcohol use of about 5.0 standard drinks per week. He reports that he does not use drugs.  Allergies: No Known Allergies  Family History:  No heart disease, cancer, stroke that he is aware of   Current Outpatient Medications:    amphetamine-dextroamphetamine (ADDERALL) 20 MG tablet, Take one tab in the morning and half tab in the evening, Disp: 45 tablet, Rfl: 0   amphetamine-dextroamphetamine (ADDERALL) 20 MG tablet, Take one tab in the morning and half tab in the evening, Disp: 45 tablet, Rfl: 0   amphetamine-dextroamphetamine (ADDERALL) 20 MG tablet, Take one tab in the morning and half tab in the evening, Disp: 45 tablet, Rfl: 0  Review of Systems:  Constitutional: Denies fever, chills,  diaphoresis, appetite change and fatigue.  HEENT: Denies photophobia, eye pain, redness, hearing loss, ear pain, congestion, sore throat, rhinorrhea, sneezing, mouth sores, trouble swallowing, neck pain, neck stiffness and tinnitus.   Respiratory: Denies SOB, DOE, cough, chest tightness,  and wheezing.   Cardiovascular: Denies chest pain, palpitations and leg swelling.  Gastrointestinal: Denies nausea, vomiting, abdominal pain, diarrhea, constipation, blood in stool and abdominal distention.  Genitourinary: Denies dysuria, urgency, frequency, hematuria, flank pain and difficulty urinating.  Endocrine: Denies: hot or cold intolerance, sweats, changes in hair or nails, polyuria, polydipsia. Musculoskeletal: Denies myalgias, back pain, joint swelling, arthralgias and gait problem.  Skin: Denies pallor, rash and wound.  Neurological: Denies dizziness, seizures, syncope, weakness, light-headedness, numbness and headaches.  Hematological: Denies adenopathy. Easy bruising, personal or family bleeding history  Psychiatric/Behavioral: Denies suicidal ideation, mood changes, confusion, nervousness, sleep disturbance and agitation    Physical Exam: Vitals:   10/12/21 1524  BP: 120/80  Pulse: 82  Temp: 98.1 F (36.7 C)  TempSrc: Oral  SpO2: 99%  Weight: 215 lb 14.4 oz (97.9 kg)  Height: 6' (1.829 m)    Body mass index is 29.28 kg/m.   Constitutional: NAD, calm, comfortable Eyes: PERRL, lids and conjunctivae normal ENMT: Mucous membranes are moist.  Respiratory: clear to auscultation bilaterally, no wheezing, no crackles. Normal respiratory effort. No accessory muscle use.  Cardiovascular: Regular rate and rhythm, no murmurs / rubs / gallops. No extremity edema.  Neurologic: Grossly intact and nonfocal Psychiatric: Normal judgment and insight.  Alert and oriented x 3. Normal mood.    Impression and Plan:  Attention deficit hyperactivity disorder (ADHD), predominantly inattentive type   -PDMP reviewed, no red flags, overdose risk score is 0. -Refill Adderall 20 mg to take 1-1/2 tablets a day for a total of 45 tablets a month x3 months.  Reschedule CPE for 6 months.  Time spent: 21 minutes reviewing chart, interviewing and examining patient, counseling on importance of preventive health care.    Lelon Frohlich, MD [facility]

## 2022-01-03 ENCOUNTER — Telehealth (INDEPENDENT_AMBULATORY_CARE_PROVIDER_SITE_OTHER): Payer: Self-pay | Admitting: Internal Medicine

## 2022-01-03 ENCOUNTER — Encounter: Payer: Self-pay | Admitting: Internal Medicine

## 2022-01-03 DIAGNOSIS — F900 Attention-deficit hyperactivity disorder, predominantly inattentive type: Secondary | ICD-10-CM

## 2022-01-03 MED ORDER — AMPHETAMINE-DEXTROAMPHETAMINE 20 MG PO TABS: ORAL_TABLET | ORAL | 0 refills | Status: DC

## 2022-01-03 NOTE — Progress Notes (Signed)
? ? ?  Virtual Visit via Telephone Note ? ?I connected with William Romero on 01/03/22 at 11:30 AM EDT by telephone and verified that I am speaking with the correct person using two identifiers. ?  ?I discussed the limitations, risks, security and privacy concerns of performing an evaluation and management service by telephone and the availability of in person appointments. I also discussed with the patient that there may be a patient responsible charge related to this service. The patient expressed understanding and agreed to proceed. ? ?Location patient: home ?Location provider: work office ?Participants present for the call: patient, provider ?Patient did not have a visit in the prior 7 days to address this/these issue(s). ? ? ?History of Present Illness: ? ?This is a scheduled visit for adderall refills per contract. No new issues to report. ?  ?Observations/Objective: ?Patient sounds cheerful and well on the phone. ?I do not appreciate any increased work of breathing. ?Speech and thought processing are grossly intact. ?Patient reported vitals: none reported ? ? ?Current Outpatient Medications:  ?  amphetamine-dextroamphetamine (ADDERALL) 20 MG tablet, Take one tab in the morning and half tab in the evening, Disp: 45 tablet, Rfl: 0 ?  amphetamine-dextroamphetamine (ADDERALL) 20 MG tablet, Take one tab in the morning and half tab in the evening, Disp: 45 tablet, Rfl: 0 ?  amphetamine-dextroamphetamine (ADDERALL) 20 MG tablet, Take one tab in the morning and half tab in the evening, Disp: 45 tablet, Rfl: 0 ? ?Review of Systems: ? ?Constitutional: Denies fever, chills, diaphoresis, appetite change and fatigue.  ?HEENT: Denies photophobia, eye pain, redness, hearing loss, ear pain, congestion, sore throat, rhinorrhea, sneezing, mouth sores, trouble swallowing, neck pain, neck stiffness and tinnitus.   ?Respiratory: Denies SOB, DOE, cough, chest tightness,  and wheezing.   ?Cardiovascular: Denies chest pain,  palpitations and leg swelling.  ?Gastrointestinal: Denies nausea, vomiting, abdominal pain, diarrhea, constipation, blood in stool and abdominal distention.  ?Genitourinary: Denies dysuria, urgency, frequency, hematuria, flank pain and difficulty urinating.  ?Endocrine: Denies: hot or cold intolerance, sweats, changes in hair or nails, polyuria, polydipsia. ?Musculoskeletal: Denies myalgias, back pain, joint swelling, arthralgias and gait problem.  ?Skin: Denies pallor, rash and wound.  ?Neurological: Denies dizziness, seizures, syncope, weakness, light-headedness, numbness and headaches.  ?Hematological: Denies adenopathy. Easy bruising, personal or family bleeding history  ?Psychiatric/Behavioral: Denies suicidal ideation, mood changes, confusion, nervousness, sleep disturbance and agitation ? ? ?Assessment and Plan: ? ?Attention deficit hyperactivity disorder (ADHD), predominantly inattentive type  ?-PDMP reviewed, no red flags, ORS 0. ?-Refill Adderall 20 mg to take 1.5 tablets daily for a total of 45 tabs a month x 3 months. ? ? ?I discussed the assessment and treatment plan with the patient. The patient was provided an opportunity to ask questions and all were answered. The patient agreed with the plan and demonstrated an understanding of the instructions. ?  ?The patient was advised to call back or seek an in-person evaluation if the symptoms worsen or if the condition fails to improve as anticipated. ? ?I provided 14 minutes of non-face-to-face time during this encounter. ? ? ?[name], MD ?[facility] ? ?

## 2022-03-29 ENCOUNTER — Ambulatory Visit (INDEPENDENT_AMBULATORY_CARE_PROVIDER_SITE_OTHER): Payer: Self-pay | Admitting: Internal Medicine

## 2022-03-29 ENCOUNTER — Encounter: Payer: Self-pay | Admitting: Internal Medicine

## 2022-03-29 DIAGNOSIS — F900 Attention-deficit hyperactivity disorder, predominantly inattentive type: Secondary | ICD-10-CM

## 2022-03-29 MED ORDER — AMPHETAMINE-DEXTROAMPHETAMINE 20 MG PO TABS: ORAL_TABLET | ORAL | 0 refills | Status: DC

## 2022-03-29 NOTE — Progress Notes (Signed)
Established Patient Office Visit     CC/Reason for Visit: Medication refills  HPI: William Romero is a 41 y.o. male who is coming in today for the above mentioned reasons. Past Medical History is significant for: ADHD on Adderall.  He has been doing well.  He is requesting that we defer his CPE due to lack of medical insurance.  He is otherwise doing well and has no acute concerns or complaints.   Past Medical/Surgical History: Past Medical History:  Diagnosis Date   Schizophrenia [facility])     Past Surgical History:  Procedure Laterality Date   APPENDECTOMY      Social History:  reports that he has quit smoking. His smoking use included cigarettes. He smoked an average of .5 packs per day. He has never used smokeless tobacco. He reports current alcohol use of about 5.0 standard drinks of alcohol per week. He reports that he does not use drugs.  Allergies: No Known Allergies  Family History:  No history of heart disease, cancer, stroke that he is aware of   Current Outpatient Medications:    amphetamine-dextroamphetamine (ADDERALL) 20 MG tablet, Take one tab in the morning and half tab in the evening, Disp: 45 tablet, Rfl: 0   amphetamine-dextroamphetamine (ADDERALL) 20 MG tablet, Take one tab in the morning and half tab in the evening, Disp: 45 tablet, Rfl: 0   amphetamine-dextroamphetamine (ADDERALL) 20 MG tablet, Take one tab in the morning and half tab in the evening, Disp: 45 tablet, Rfl: 0  Review of Systems:  Constitutional: Denies fever, chills, diaphoresis, appetite change and fatigue.  HEENT: Denies photophobia, eye pain, redness, hearing loss, ear pain, congestion, sore throat, rhinorrhea, sneezing, mouth sores, trouble swallowing, neck pain, neck stiffness and tinnitus.   Respiratory: Denies SOB, DOE, cough, chest tightness,  and wheezing.   Cardiovascular: Denies chest pain, palpitations and leg swelling.  Gastrointestinal: Denies nausea, vomiting,  abdominal pain, diarrhea, constipation, blood in stool and abdominal distention.  Genitourinary: Denies dysuria, urgency, frequency, hematuria, flank pain and difficulty urinating.  Endocrine: Denies: hot or cold intolerance, sweats, changes in hair or nails, polyuria, polydipsia. Musculoskeletal: Denies myalgias, back pain, joint swelling, arthralgias and gait problem.  Skin: Denies pallor, rash and wound.  Neurological: Denies dizziness, seizures, syncope, weakness, light-headedness, numbness and headaches.  Hematological: Denies adenopathy. Easy bruising, personal or family bleeding history  Psychiatric/Behavioral: Denies suicidal ideation, mood changes, confusion, nervousness, sleep disturbance and agitation    Physical Exam: Vitals:   03/29/22 1443  BP: 120/78  Pulse: 88  Temp: 98.2 F (36.8 C)  TempSrc: Oral  SpO2: 98%  Weight: 228 lb (103.4 kg)    Body mass index is 30.92 kg/m.   Constitutional: NAD, calm, comfortable Eyes: PERRL, lids and conjunctivae normal ENMT: Mucous membranes are moist.  Respiratory: clear to auscultation bilaterally, no wheezing, no crackles. Normal respiratory effort. No accessory muscle use.  Cardiovascular: Regular rate and rhythm, no murmurs / rubs / gallops. No extremity edema.  Neurologic: Grossly intact and nonfocal Psychiatric: Normal judgment and insight. Alert and oriented x 3. Normal mood.    Impression and Plan:  Attention deficit hyperactivity disorder (ADHD), predominantly inattentive type -PDMP reviewed, no red flags, overdose risk score is 0. -Refill Adderall 20 mg to take 1-1/2 tablets daily for total of 45 tablets a month x3 months.  He will schedule CPE in the next few months.   Time spent:24 minutes reviewing chart, interviewing and examining patient and formulating plan of care.  Maryama Kuriakose Hernandez Acosta, MD [facility]   

## 2022-04-06 ENCOUNTER — Telehealth: Payer: Self-pay | Admitting: Internal Medicine

## 2022-06-21 ENCOUNTER — Encounter: Payer: Self-pay | Admitting: Internal Medicine

## 2022-06-21 ENCOUNTER — Telehealth (INDEPENDENT_AMBULATORY_CARE_PROVIDER_SITE_OTHER): Payer: Self-pay | Admitting: Internal Medicine

## 2022-06-21 DIAGNOSIS — F900 Attention-deficit hyperactivity disorder, predominantly inattentive type: Secondary | ICD-10-CM

## 2022-06-21 MED ORDER — AMPHETAMINE-DEXTROAMPHETAMINE 20 MG PO TABS: ORAL_TABLET | ORAL | 0 refills | Status: DC

## 2022-06-21 NOTE — Progress Notes (Signed)
    Virtual Visit via Telephone Note  I connected with William Romero on 06/21/22 at  8:30 AM EDT by telephone and verified that I am speaking with the correct person using two identifiers.   I discussed the limitations, risks, security and privacy concerns of performing an evaluation and management service by telephone and the availability of in person appointments. I also discussed with the patient that there may be a patient responsible charge related to this service. The patient expressed understanding and agreed to proceed.  Location patient: home Location provider: work office Participants present for the call: patient, provider Patient did not have a visit in the prior 7 days to address this/these issue(s).   History of Present Illness:  He has scheduled this visit for controlled substance refills per protocol.  He has a history of ADHD and is prescribed Adderall for this.  He has been doing well and has no acute concerns.  Is tolerating medication well.   Observations/Objective: Patient sounds cheerful and well on the phone. I do not appreciate any increased work of breathing. Speech and thought processing are grossly intact. Patient reported vitals: None reported   Current Outpatient Medications:    amphetamine-dextroamphetamine (ADDERALL) 20 MG tablet, Take one tab in the morning and half tab in the evening, Disp: 45 tablet, Rfl: 0   amphetamine-dextroamphetamine (ADDERALL) 20 MG tablet, Take one tab in the morning and half tab in the evening, Disp: 45 tablet, Rfl: 0   amphetamine-dextroamphetamine (ADDERALL) 20 MG tablet, Take one tab in the morning and half tab in the evening, Disp: 45 tablet, Rfl: 0  Review of Systems:  Constitutional: Denies fever, chills, diaphoresis, appetite change and fatigue.  HEENT: Denies photophobia, eye pain, redness, hearing loss, ear pain, congestion, sore throat, rhinorrhea, sneezing, mouth sores, trouble swallowing, neck pain, neck  stiffness and tinnitus.   Respiratory: Denies SOB, DOE, cough, chest tightness,  and wheezing.   Cardiovascular: Denies chest pain, palpitations and leg swelling.  Gastrointestinal: Denies nausea, vomiting, abdominal pain, diarrhea, constipation, blood in stool and abdominal distention.  Genitourinary: Denies dysuria, urgency, frequency, hematuria, flank pain and difficulty urinating.  Endocrine: Denies: hot or cold intolerance, sweats, changes in hair or nails, polyuria, polydipsia. Musculoskeletal: Denies myalgias, back pain, joint swelling, arthralgias and gait problem.  Skin: Denies pallor, rash and wound.  Neurological: Denies dizziness, seizures, syncope, weakness, light-headedness, numbness and headaches.  Hematological: Denies adenopathy. Easy bruising, personal or family bleeding history  Psychiatric/Behavioral: Denies suicidal ideation, mood changes, confusion, nervousness, sleep disturbance and agitation   Assessment and Plan:  Attention deficit hyperactivity disorder (ADHD), predominantly inattentive type - Plan: amphetamine-dextroamphetamine (ADDERALL) 20 MG tablet, amphetamine-dextroamphetamine (ADDERALL) 20 MG tablet, amphetamine-dextroamphetamine (ADDERALL) 20 MG tablet  -PDMP reviewed, no red flags, overdose risk score is 0. -Refill Adderall 20 mg to take 1 tablet in the morning and half tablet in the afternoon for total of 45 tablets a month x3 months.  I discussed the assessment and treatment plan with the patient. The patient was provided an opportunity to ask questions and all were answered. The patient agreed with the plan and demonstrated an understanding of the instructions.   The patient was advised to call back or seek an in-person evaluation if the symptoms worsen or if the condition fails to improve as anticipated.  I provided 13 minutes of non-face-to-face time during this encounter.   Lelon Frohlich, MD [facility]

## 2022-09-03 ENCOUNTER — Emergency Department (HOSPITAL_COMMUNITY): Payer: Self-pay

## 2022-09-03 ENCOUNTER — Encounter (HOSPITAL_COMMUNITY): Payer: Self-pay

## 2022-09-03 ENCOUNTER — Emergency Department (HOSPITAL_COMMUNITY)
Admission: EM | Admit: 2022-09-03 | Discharge: 2022-09-04 | Disposition: A | Discharge status: Home / Self Care | Payer: Self-pay | Attending: Emergency Medicine | Admitting: Emergency Medicine

## 2022-09-03 DIAGNOSIS — M79672 Pain in left foot: Secondary | ICD-10-CM | POA: Insufficient documentation

## 2022-09-03 DIAGNOSIS — Z20822 Contact with and (suspected) exposure to covid-19: Secondary | ICD-10-CM | POA: Insufficient documentation

## 2022-09-03 DIAGNOSIS — M79671 Pain in right foot: Secondary | ICD-10-CM | POA: Insufficient documentation

## 2022-09-03 DIAGNOSIS — M25512 Pain in left shoulder: Secondary | ICD-10-CM

## 2022-09-03 LAB — COMPREHENSIVE METABOLIC PANEL
ALT: 22 U/L (ref 0–44)
AST: 22 U/L (ref 15–41)
Albumin: 3.9 g/dL (ref 3.5–5.0)
Alkaline Phosphatase: 79 U/L (ref 38–126)
Anion gap: 12 (ref 5–15)
BUN: 9 mg/dL (ref 6–20)
CO2: 26 mmol/L (ref 22–32)
Calcium: 8.8 mg/dL — ABNORMAL LOW (ref 8.9–10.3)
Chloride: 97 mmol/L — ABNORMAL LOW (ref 98–111)
Creatinine, Ser: 0.97 mg/dL (ref 0.61–1.24)
GFR, Estimated: >60 mL/min (ref >60)
Glucose, Bld: 111 mg/dL — ABNORMAL HIGH (ref 70–99)
Potassium: 3.2 mmol/L — ABNORMAL LOW (ref 3.5–5.1)
Sodium: 135 mmol/L (ref 135–145)
Total Bilirubin: 0.9 mg/dL (ref 0.3–1.2)
Total Protein: 8.5 g/dL — ABNORMAL HIGH (ref 6.5–8.1)

## 2022-09-03 LAB — TROPONIN I (HIGH SENSITIVITY): Troponin I (High Sensitivity): 3 ng/L (ref <18)

## 2022-09-03 LAB — RESP PANEL BY RT-PCR (RSV, FLU A&B, COVID)  RVPGX2
Influenza A by PCR: NEGATIVE
Influenza B by PCR: NEGATIVE
Resp Syncytial Virus by PCR: NEGATIVE
SARS Coronavirus 2 by RT PCR: NEGATIVE

## 2022-09-03 LAB — BRAIN NATRIURETIC PEPTIDE: B Natriuretic Peptide: 16.9 pg/mL (ref 0.0–100.0)

## 2022-09-03 MED ORDER — METHYLPREDNISOLONE SODIUM SUCC 125 MG IJ SOLR
125.0000 mg | Freq: Once | INTRAMUSCULAR | Status: AC
  Administered 2022-09-03: 125 mg via INTRAMUSCULAR
  Filled 2022-09-03: qty 2

## 2022-09-03 MED ORDER — PREDNISONE 10 MG PO TABS: 20.0000 mg | ORAL_TABLET | Freq: Every day | ORAL | 0 refills | Status: DC

## 2022-09-03 MED ORDER — HYDROCODONE-ACETAMINOPHEN 5-325 MG PO TABS: 1.0000 | ORAL_TABLET | Freq: Four times a day (QID) | ORAL | 0 refills | Status: DC | PRN

## 2022-09-03 MED ORDER — HYDROMORPHONE HCL 1 MG/ML IJ SOLN
1.0000 mg | Freq: Once | INTRAMUSCULAR | Status: AC
  Administered 2022-09-03: 1 mg via INTRAMUSCULAR
  Filled 2022-09-03: qty 1

## 2022-09-03 NOTE — Discharge Instructions (Signed)
Follow-up with your doctor next week for recheck.  Try to stay off your feet is much as possible.  Your blood pressure was elevated today and could be secondary to the pain you are having.  Have your blood pressure rechecked next week when you see your family doctor

## 2022-09-03 NOTE — ED Triage Notes (Signed)
Pt presents with c/o bilateral foot pain and left shoulder pain. Pt says he is unsure why his feet are hurting but that they are also swollen. Pt reports no injury to the left shoulder, just says that he is unable to lift it.

## 2022-09-03 NOTE — ED Provider Notes (Signed)
[facility]-EMERGENCY DEPT Provider Note   CSN: [id number] Arrival date & time: 09/03/22  1720     History  Chief Complaint  Patient presents with   Foot Pain   Shoulder Pain    William Romero is a 41 y.o. male.  Patient has a history of schizophrenia.  Patient complains of bilateral feet pain and pain in his left shoulder.  The history is provided by the patient and medical records. No language interpreter was used.  Foot Pain This is a new problem. The current episode started 12 to 24 hours ago. The problem occurs constantly. The problem has not changed since onset.Pertinent negatives include no chest pain, no abdominal pain and no headaches. Nothing aggravates the symptoms.  Shoulder Pain Associated symptoms: no back pain and no fatigue        Home Medications Prior to Admission medications   Medication Sig Start Date End Date Taking? Authorizing Provider  HYDROcodone-acetaminophen (NORCO/VICODIN) 5-325 MG tablet Take 1 tablet by mouth every 6 (six) hours as needed for moderate pain. 09/03/22  Yes Bethann Berkshire, MD  predniSONE (DELTASONE) 10 MG tablet Take 2 tablets (20 mg total) by mouth daily. 09/03/22  Yes Bethann Berkshire, MD  amphetamine-dextroamphetamine (ADDERALL) 20 MG tablet Take one tab in the morning and half tab in the evening 06/21/22   Philip Aspen, Limmie Patricia, MD  amphetamine-dextroamphetamine (ADDERALL) 20 MG tablet Take one tab in the morning and half tab in the evening 06/21/22   Philip Aspen, Limmie Patricia, MD  amphetamine-dextroamphetamine (ADDERALL) 20 MG tablet Take one tab in the morning and half tab in the evening 06/21/22   Philip Aspen, Limmie Patricia, MD      Allergies    Patient has no known allergies.    Review of Systems   Review of Systems  Constitutional:  Negative for appetite change and fatigue.  HENT:  Negative for congestion, ear discharge and sinus pressure.   Eyes:  Negative for discharge.  Respiratory:   Negative for cough.   Cardiovascular:  Negative for chest pain.  Gastrointestinal:  Negative for abdominal pain and diarrhea.  Genitourinary:  Negative for frequency and hematuria.  Musculoskeletal:  Negative for back pain.       Shoulder and feet pain, left shoulder  Skin:  Negative for rash.  Neurological:  Negative for seizures and headaches.  Psychiatric/Behavioral:  Negative for hallucinations.     Physical Exam Updated Vital Signs BP (!) 155/111 (BP Location: Right Arm)   Pulse (!) 117   Temp 99.6 F (37.6 C) (Oral)   Resp 20   SpO2 99%  Physical Exam Vitals and nursing note reviewed.  Constitutional:      Appearance: He is well-developed.  HENT:     Head: Normocephalic.     Nose: Nose normal.  Eyes:     General: No scleral icterus.    Conjunctiva/sclera: Conjunctivae normal.  Neck:     Thyroid: No thyromegaly.  Cardiovascular:     Rate and Rhythm: Normal rate and regular rhythm.     Heart sounds: No murmur heard.    No friction rub. No gallop.  Pulmonary:     Breath sounds: No stridor. No wheezing or rales.  Chest:     Chest wall: No tenderness.  Abdominal:     General: There is no distension.     Tenderness: There is no abdominal tenderness. There is no rebound.  Musculoskeletal:     Cervical back: Neck supple.  Comments: Tender left shoulder with pain with abduction, patient also has swelling tenderness to the lateral aspect of both feet  Lymphadenopathy:     Cervical: No cervical adenopathy.  Skin:    Findings: No erythema or rash.  Neurological:     Mental Status: He is alert and oriented to person, place, and time.     Motor: No abnormal muscle tone.     Coordination: Coordination normal.  Psychiatric:        Behavior: Behavior normal.     ED Results / Procedures / Treatments   Labs (all labs ordered are listed, but only abnormal results are displayed) Labs Reviewed  COMPREHENSIVE METABOLIC PANEL - Abnormal; Notable for the following  components:      Result Value   Potassium 3.2 (*)    Chloride 97 (*)    Glucose, Bld 111 (*)    Calcium 8.8 (*)    Total Protein 8.5 (*)    All other components within normal limits  RESP PANEL BY RT-PCR (RSV, FLU A&B, COVID)  RVPGX2  BRAIN NATRIURETIC PEPTIDE  TROPONIN I (HIGH SENSITIVITY)    EKG EKG Interpretation  Date/Time:  Saturday September 03 2022 18:13:02 EST Ventricular Rate:  115 PR Interval:  161 QRS Duration: 108 QT Interval:  322 QTC Calculation: 446 R Axis:   -22 Text Interpretation: Sinus tachycardia Left ventricular hypertrophy Confirmed by Bethann Berkshire [id number]) on 09/03/2022 9:52:23 PM  Radiology DG Shoulder Left  Result Date: 09/03/2022 CLINICAL DATA:  Anterior left shoulder pain. EXAM: LEFT SHOULDER - 2+ VIEW COMPARISON:  None Available. FINDINGS: Normal bone mineralization. Normal alignment. Mild superior clavicular head degenerative hypertrophy. Joint spaces are preserved. No acute fracture or dislocation. IMPRESSION: Mild acromioclavicular osteoarthritis. Electronically Signed   By: Neita Garnet M.D.   On: 09/03/2022 18:43   DG Chest 2 View  Result Date: 09/03/2022 CLINICAL DATA:  Shoulder pain. EXAM: CHEST - 2 VIEW COMPARISON:  Chest two views 10/25/2018 FINDINGS: Cardiac silhouette and mediastinal contours are within normal limits. Mildly decreased lung volumes. The lungs are clear. No pleural effusion or pneumothorax. Mild-to-moderate multilevel degenerative disc changes of the thoracic spine. IMPRESSION: No active cardiopulmonary disease. Electronically Signed   By: Neita Garnet M.D.   On: 09/03/2022 18:40    Procedures Procedures    Medications Ordered in ED Medications  methylPREDNISolone sodium succinate (SOLU-MEDROL) 125 mg/2 mL injection 125 mg (125 mg Intramuscular Given 09/03/22 2227)  HYDROmorphone (DILAUDID) injection 1 mg (1 mg Intramuscular Given 09/03/22 2227)    ED Course/ Medical Decision Making/ A&P                            Medical Decision Making Risk Prescription drug management.  This patient presents to the ED for concern of left shoulder and bilateral feet pain, this involves an extensive number of treatment options, and is a complaint that carries with it a high risk of complications and morbidity.  The differential diagnosis includes musculoskeletal injury   Co morbidities that complicate the patient evaluation  Schizophrenia   Additional history obtained:  Additional history obtained from patient External records from outside source obtained and reviewed including hospital records   Lab Tests:  I Ordered, and personally interpreted labs.  The pertinent results include: Chemistry shows glucose 111 and potassium 3.2   Imaging Studies ordered:  I ordered imaging studies including left shoulder I independently visualized and interpreted imaging which showed osteoarthritis of the acromioclavicular joint  I agree with the radiologist interpretation   Cardiac Monitoring: / EKG:  The patient was maintained on a cardiac monitor.  I personally viewed and interpreted the cardiac monitored which showed an underlying rhythm of: Normal sinus rhythm   Consultations Obtained:  No consultant  Problem List / ED Course / Critical interventions / Medication management  Osteoarthritis left shoulder with inflamed bilateral feet I ordered medication including prednisone and Vicodin for inflammation of joints Reevaluation of the patient after these medicines showed that the patient improved I have reviewed the patients home medicines and have made adjustments as needed   Social Determinants of Health:  Schizophrenic   Test / Admission - Considered:  None  Patient with osteoarthritis in his left shoulder and inflamed bilateral lateral feet.  He is placed on prednisone and pain medicine and will follow-up with his PCP.  His blood pressure was also elevated will be checked again by his family  doctor in a week        Final Clinical Impression(s) / ED Diagnoses Final diagnoses:  Acute pain of left shoulder    Rx / DC Orders ED Discharge Orders          Ordered    predniSONE (DELTASONE) 10 MG tablet  Daily        09/03/22 2334    HYDROcodone-acetaminophen (NORCO/VICODIN) 5-325 MG tablet  Every 6 hours PRN        09/03/22 2334              Bethann Berkshire, MD 09/04/22 1059

## 2022-09-03 NOTE — ED Provider Triage Note (Cosign Needed)
Emergency Medicine Provider Triage Evaluation Note  William Romero , a 41 y.o. male  was evaluated in triage.  Pt complains of lateral swelling and shoulder pain.  History of schizophrenia.  No IV drug use no alcohol abuse.  Patient states that he feels like he had a fever today..  Review of Systems  Positive: swelling Negative: vomiting  Physical Exam  BP (!) 155/111 (BP Location: Right Arm)   Pulse (!) 117   Temp 99.6 F (37.6 C) (Oral)   Resp 20   SpO2 99%  Gen:   Awake, no distress   Resp:  Normal effort  MSK:   Moves extremities without difficulty  Other:    Medical Decision Making  Medically screening exam initiated at 6:03 PM.  Appropriate orders placed.  William Romero was informed that the remainder of the evaluation will be completed by another provider, this initial triage assessment does not replace that evaluation, and the importance of remaining in the ED until their evaluation is complete.     Arthor Captain, PA-C 09/03/22 [phone]

## 2022-09-04 NOTE — ED Notes (Signed)
Opened chart to provide work note per pts request.

## 2022-09-26 ENCOUNTER — Encounter: Payer: Self-pay | Admitting: Internal Medicine

## 2022-09-26 ENCOUNTER — Ambulatory Visit (INDEPENDENT_AMBULATORY_CARE_PROVIDER_SITE_OTHER): Payer: Self-pay | Admitting: Internal Medicine

## 2022-09-26 VITALS — BP 131/86 | HR 67 | Temp 98.7°F | Wt 230.4 lb

## 2022-09-26 DIAGNOSIS — R030 Elevated blood-pressure reading, without diagnosis of hypertension: Secondary | ICD-10-CM

## 2022-09-26 DIAGNOSIS — F900 Attention-deficit hyperactivity disorder, predominantly inattentive type: Secondary | ICD-10-CM

## 2022-09-26 MED ORDER — AMPHETAMINE-DEXTROAMPHETAMINE 20 MG PO TABS: ORAL_TABLET | ORAL | 0 refills | Status: DC

## 2022-09-26 NOTE — Progress Notes (Signed)
Established Patient Office Visit     CC/Reason for Visit: Medication refills, ED follow-up  HPI: William Romero is a 42 y.o. male who is coming in today for the above mentioned reasons. Past Medical History is significant for: Schizophrenia, ADHD on Adderall.  He is here today mainly for medication refills of his Adderall per contract.  He also recently had an ED visit for left shoulder pain and lower extremity edema.  His blood pressure was noted to be elevated at that time.  He was thought to have arthritis and has improved with some prednisone and Vicodin.   Past Medical/Surgical History: Past Medical History:  Diagnosis Date   Schizophrenia [facility])     Past Surgical History:  Procedure Laterality Date   APPENDECTOMY      Social History:  reports that he has quit smoking. His smoking use included cigarettes. He smoked an average of .5 packs per day. He has never used smokeless tobacco. He reports current alcohol use of about 5.0 standard drinks of alcohol per week. He reports that he does not use drugs.  Allergies: No Known Allergies  Family History:  No family history on file.   Current Outpatient Medications:    amphetamine-dextroamphetamine (ADDERALL) 20 MG tablet, Take one tab in the morning and half tab in the evening, Disp: 45 tablet, Rfl: 0   amphetamine-dextroamphetamine (ADDERALL) 20 MG tablet, Take one tab in the morning and half tab in the evening, Disp: 45 tablet, Rfl: 0   amphetamine-dextroamphetamine (ADDERALL) 20 MG tablet, Take one tab in the morning and half tab in the evening, Disp: 45 tablet, Rfl: 0  Review of Systems:  Negative unless indicated in HPI.   Physical Exam: Vitals:   09/26/22 1511 09/26/22 1515  BP: (!) 140/100 131/86  Pulse: 67   Temp: 98.7 F (37.1 C)   TempSrc: Oral   SpO2: 97%   Weight: 230 lb 6.4 oz (104.5 kg)     Body mass index is 31.25 kg/m.   Physical Exam Vitals reviewed.  Constitutional:       Appearance: Normal appearance.  HENT:     Head: Normocephalic and atraumatic.  Eyes:     Conjunctiva/sclera: Conjunctivae normal.     Pupils: Pupils are equal, round, and reactive to light.  Cardiovascular:     Rate and Rhythm: Normal rate and regular rhythm.  Pulmonary:     Effort: Pulmonary effort is normal.     Breath sounds: Normal breath sounds.  Musculoskeletal:     Right lower leg: 2+ Pitting Edema present.     Left lower leg: 2+ Pitting Edema present.  Skin:    General: Skin is warm and dry.  Neurological:     General: No focal deficit present.     Mental Status: He is alert and oriented to person, place, and time.  Psychiatric:        Mood and Affect: Mood normal.        Behavior: Behavior normal.        Thought Content: Thought content normal.        Judgment: Judgment normal.      Impression and Plan:  Elevated BP without diagnosis of hypertension  Attention deficit hyperactivity disorder (ADHD), predominantly inattentive type - Plan: amphetamine-dextroamphetamine (ADDERALL) 20 MG tablet, amphetamine-dextroamphetamine (ADDERALL) 20 MG tablet, amphetamine-dextroamphetamine (ADDERALL) 20 MG tablet  -PDMP reviewed, no flags, overdose risk score is 380. -Suspect increased ORS related to recent Vicodin received in ED for left shoulder  pain. -Refill Adderall 20 mg to take 1 tablet in the morning and half tablet in the evening for total of 45 tablets a month x 3 months. -Blood pressure is noted to be elevated today, I have advised ambulatory blood pressure monitoring and return in 3 months for follow-up.  Time spent:31 minutes reviewing chart, interviewing and examining patient and formulating plan of care.     Lelon Frohlich, MD [facility] at [facility]

## 2022-11-01 ENCOUNTER — Encounter: Payer: Self-pay | Admitting: Internal Medicine

## 2022-11-01 ENCOUNTER — Ambulatory Visit (INDEPENDENT_AMBULATORY_CARE_PROVIDER_SITE_OTHER): Payer: Self-pay | Admitting: Internal Medicine

## 2022-11-01 VITALS — BP 130/88 | HR 87 | Temp 98.1°F | Wt 215.1 lb

## 2022-11-01 DIAGNOSIS — F209 Schizophrenia, unspecified: Secondary | ICD-10-CM | POA: Insufficient documentation

## 2022-11-01 NOTE — Progress Notes (Signed)
Established Patient Office Visit     CC/Reason for Visit: Mental health issues  HPI: William Romero is a 42 y.o. male who is coming in today for the above mentioned reasons. Past Medical History is significant for: Schizophrenia, ADHD.  He has been homeless for some time.  He has been living out of his car.  A few months ago he started driving a new to him vehicle.  Ever since then has been having issues with his mental health.  He feels "like my subconscious has been more active than normal", "it is like there is another person in the car with me talking".  He has not received any harmful direction by these voices per report.  He has not seen a psychiatrist in a long time.  He feels like he received a bad batch of Adderall.  Ever since he refilled his last prescription it has made him "hazy".  He asks me: "Do you believe there could be issues other than medical factors, like religious factors that could be living in my car with me?".  When I asked for further clarification, "could there be a spirit living in the car with me?".  It appears he has had family issues.  He tells me that his family has made him go to jail and he has been involuntarily committed by them in the past.  This has made him hesitant to access our mental health care system.   Past Medical/Surgical History: Past Medical History:  Diagnosis Date   Schizophrenia [facility])     Past Surgical History:  Procedure Laterality Date   APPENDECTOMY      Social History:  reports that he has quit smoking. His smoking use included cigarettes. He smoked an average of .5 packs per day. He has never used smokeless tobacco. He reports current alcohol use of about 5.0 standard drinks of alcohol per week. He reports that he does not use drugs.  Allergies: No Known Allergies  Family History:  No history of heart disease, cancer, stroke that he is aware of   Current Outpatient Medications:    amphetamine-dextroamphetamine  (ADDERALL) 20 MG tablet, Take one tab in the morning and half tab in the evening, Disp: 45 tablet, Rfl: 0   amphetamine-dextroamphetamine (ADDERALL) 20 MG tablet, Take one tab in the morning and half tab in the evening, Disp: 45 tablet, Rfl: 0   amphetamine-dextroamphetamine (ADDERALL) 20 MG tablet, Take one tab in the morning and half tab in the evening, Disp: 45 tablet, Rfl: 0  Review of Systems:  Negative unless indicated in HPI.   Physical Exam: Vitals:   11/01/22 1537  BP: 130/88  Pulse: 87  Temp: 98.1 F (36.7 C)  TempSrc: Oral  SpO2: 96%  Weight: 215 lb 1.6 oz (97.6 kg)    Body mass index is 29.17 kg/m.   Physical Exam Vitals reviewed.  Constitutional:      Appearance: Normal appearance.  HENT:     Head: Normocephalic and atraumatic.  Eyes:     Conjunctiva/sclera: Conjunctivae normal.     Pupils: Pupils are equal, round, and reactive to light.  Skin:    General: Skin is warm and dry.  Neurological:     General: No focal deficit present.     Mental Status: He is alert and oriented to person, place, and time.  Psychiatric:        Mood and Affect: Mood normal.        Behavior: Behavior normal.  Thought Content: Thought content normal.        Judgment: Judgment normal.      Impression and Plan:  Schizophrenia, unspecified type ([place])  -I am concerned about his schizophrenia.  I believe he needs to be evaluated by mental health providers but he refuses referral today.  I have given him information of all the walk-in clinics in the area that are available.  I do not think he is an immediate danger to himself or others.  I do not believe he received a "bad batch" of Adderall, I think the main issue is his schizophrenia.  Time spent:31 minutes reviewing chart, interviewing and examining patient and formulating plan of care.     Lelon Frohlich, MD [facility] at [facility]

## 2022-12-02 DIAGNOSIS — F22 Delusional disorders: Secondary | ICD-10-CM | POA: Insufficient documentation

## 2022-12-02 DIAGNOSIS — M79671 Pain in right foot: Secondary | ICD-10-CM | POA: Insufficient documentation

## 2022-12-02 DIAGNOSIS — M79672 Pain in left foot: Secondary | ICD-10-CM | POA: Insufficient documentation

## 2022-12-03 ENCOUNTER — Emergency Department (HOSPITAL_COMMUNITY): Payer: Self-pay

## 2022-12-03 ENCOUNTER — Emergency Department (HOSPITAL_COMMUNITY)
Admission: EM | Admit: 2022-12-03 | Discharge: 2022-12-03 | Disposition: A | Discharge status: Home / Self Care | Payer: Self-pay | Attending: Emergency Medicine | Admitting: Emergency Medicine

## 2022-12-03 ENCOUNTER — Encounter (HOSPITAL_COMMUNITY): Payer: Self-pay

## 2022-12-03 DIAGNOSIS — F209 Schizophrenia, unspecified: Secondary | ICD-10-CM | POA: Diagnosis present

## 2022-12-03 DIAGNOSIS — M79671 Pain in right foot: Secondary | ICD-10-CM

## 2022-12-03 DIAGNOSIS — E876 Hypokalemia: Secondary | ICD-10-CM

## 2022-12-03 LAB — CBC WITH DIFFERENTIAL/PLATELET
Abs Immature Granulocytes: 0.01 10*3/uL (ref 0.00–0.07)
Basophils Absolute: 0.1 10*3/uL (ref 0.0–0.1)
Basophils Relative: 1 %
Eosinophils Absolute: 0.3 10*3/uL (ref 0.0–0.5)
Eosinophils Relative: 5 %
HCT: 36.5 % — ABNORMAL LOW (ref 39.0–52.0)
Hemoglobin: 12.4 g/dL — ABNORMAL LOW (ref 13.0–17.0)
Immature Granulocytes: 0 %
Lymphocytes Relative: 20 %
Lymphs Abs: 1.1 10*3/uL (ref 0.7–4.0)
MCH: 30 pg (ref 26.0–34.0)
MCHC: 34 g/dL (ref 30.0–36.0)
MCV: 88.2 fL (ref 80.0–100.0)
Monocytes Absolute: 0.5 10*3/uL (ref 0.1–1.0)
Monocytes Relative: 9 %
Neutro Abs: 3.6 10*3/uL (ref 1.7–7.7)
Neutrophils Relative %: 65 %
Platelets: 317 10*3/uL (ref 150–400)
RBC: 4.14 MIL/uL — ABNORMAL LOW (ref 4.22–5.81)
RDW: 13.3 % (ref 11.5–15.5)
WBC: 5.6 10*3/uL (ref 4.0–10.5)
nRBC: 0 % (ref 0.0–0.2)

## 2022-12-03 LAB — COMPREHENSIVE METABOLIC PANEL
ALT: 23 U/L (ref 0–44)
AST: 29 U/L (ref 15–41)
Albumin: 3.7 g/dL (ref 3.5–5.0)
Alkaline Phosphatase: 67 U/L (ref 38–126)
Anion gap: 7 (ref 5–15)
BUN: 10 mg/dL (ref 6–20)
CO2: 26 mmol/L (ref 22–32)
Calcium: 8.6 mg/dL — ABNORMAL LOW (ref 8.9–10.3)
Chloride: 105 mmol/L (ref 98–111)
Creatinine, Ser: 0.88 mg/dL (ref 0.61–1.24)
GFR, Estimated: >60 mL/min (ref >60)
Glucose, Bld: 124 mg/dL — ABNORMAL HIGH (ref 70–99)
Potassium: 2.9 mmol/L — ABNORMAL LOW (ref 3.5–5.1)
Sodium: 138 mmol/L (ref 135–145)
Total Bilirubin: 0.7 mg/dL (ref 0.3–1.2)
Total Protein: 6.8 g/dL (ref 6.5–8.1)

## 2022-12-03 LAB — RAPID URINE DRUG SCREEN, HOSP PERFORMED
Amphetamines: POSITIVE — AB
Barbiturates: NOT DETECTED
Benzodiazepines: NOT DETECTED
Cocaine: NOT DETECTED
Opiates: NOT DETECTED
Tetrahydrocannabinol: POSITIVE — AB

## 2022-12-03 MED ORDER — POTASSIUM CHLORIDE CRYS ER 20 MEQ PO TBCR
40.0000 meq | EXTENDED_RELEASE_TABLET | Freq: Once | ORAL | Status: AC
  Administered 2022-12-03: 40 meq via ORAL
  Filled 2022-12-03: qty 2

## 2022-12-03 MED ORDER — POTASSIUM CHLORIDE CRYS ER 20 MEQ PO TBCR: 20.0000 meq | EXTENDED_RELEASE_TABLET | Freq: Two times a day (BID) | ORAL | 0 refills | Status: DC

## 2022-12-03 NOTE — Consult Note (Signed)
[place] ED ASSESSMENT    Reason for Consult: Schizophrenic patient with poor outpatient follow-up; homeless Referring Physician:  Theressa Stamps, PA-C Patient Identification: William Romero MRN:  [id number] ED Chief Complaint: Schizophrenia  Diagnosis:  Principal Problem:   Schizophrenia [facility])   ED Assessment Time Calculation: Start Time: 1200 Stop Time: 1240 Total Time in Minutes (Assessment Completion): 40   HPI: Per triage note: Patient reports bilateral foot pain, no injury, reports "it feels weird to walk on them, like the ground is moving ".  Reports painful to walk.  Patient ambulatory to triage without difficulty.    Subjective:  William Romero, 42 y.o., male patient seen face to face by this provider, consulted with Dr. Dwyane Dee; and chart reviewed on 12/03/22.  On evaluation William Romero reports that he has problems with the geomagnetic feel, creatinine a hard time for him to walk, because he feels like the ground is vibrating.  Patient has a sensor app on his phone, that calculates orientation, sensory and geomagnetic feels.  Patient appropriately shows provider, the different coordinations of the at and the calculations.  Patient denies SI/HI/AVH, states he is not here for his mental health diagnoses, says he is here just for foot pain.  Patient states that his appetite is good and his sleep is fair, due to being homeless, states he feels like he has to be on alert.  Patient denies using any drugs or alcohol.  Patient UDS positive for amphetamines and THC.  BAL less than 10.  Patient states he is compliant with his medications, he only takes Adderall, nothing for schizophrenia.  Provider discussed with him the Adderall is a stimulant and may precipitate or increase manic episodes.  Provider encouraged patient to follow-up with [facility] open access outpatient, for medication management.   During evaluation William Romero is sitting up in his bed, looking on his  telephone in no acute distress.  He is alert, oriented x 4, calm, cooperative and attentive.  Patient has good eye contact.  Patient stated that he has had some acne/bumps on his face, and he squeezed them open, patient has multiple areas on the face with scabs and excoriation. His mood is euthymic with congruent affect. He has normal speech, and behavior.  Patient is able to converse coherently, no distractibility, or pre-occupation.  He denies suicidal/self-harm/homicidal ideation, psychosis, and paranoia.  Patient does have fixed delusions, about the geomagnetic feel in the vibrations of the ground, he feels he does not need to be admitted into a psychiatric inpatient facility.  Patient is compliant and appropriate with provider, answering questions appropriately and cooperating, denies SI/HI/AVH, he is not an immediate danger to himself or anyone else, does not meet criteria for IVC.  Patient states he will follow-up with [facility] open Access for medication management.   At this time William Romero is educated and verbalizes understanding of mental health resources and other crisis services in the community. He is instructed to call 911 and present to the nearest emergency room should he experience any suicidal/homicidal ideation, auditory/visual/hallucinations, or detrimental worsening of his mental health condition.    Past Psychiatric History: Schizophrenia   Risk to Self or Others: Is the patient at risk to self? Patient denies  Has the patient been a risk to self in the past 6 months? Patient denies  Has the patient been a risk to self within the distant past? Patient denies  Is the patient a risk to others? Patient denies  Has the patient been a risk to others in the past 6 months? Patient denies  Has the patient been a risk to others within the distant past? Patient denies   [place]:  [place] ED from 12/03/2022 in [facility] Emergency Department at [facility] ED from 09/03/2022 in [facility] Emergency Department at [place] No Risk No Risk       AIMS:  , , ,  ,   ASAM:    Substance Abuse:     Past Medical History:  Past Medical History:  Diagnosis Date   Schizophrenia [facility])     Past Surgical History:  Procedure Laterality Date   APPENDECTOMY     Family History: History reviewed. No pertinent family history.   Social History:  Social History   Substance and Sexual Activity  Alcohol Use Not Currently   Alcohol/week: 5.0 standard drinks of alcohol   Types: 5 Cans of beer per week     Social History   Substance and Sexual Activity  Drug Use No    Social History   Socioeconomic History   Marital status: Single    Spouse name: Not on file   Number of children: Not on file   Years of education: Not on file   Highest education level: Not on file  Occupational History   Not on file  Tobacco Use   Smoking status: Former    Packs/day: .5    Types: Cigarettes   Smokeless tobacco: Never  Vaping Use   Vaping Use: Never used  Substance and Sexual Activity   Alcohol use: Not Currently    Alcohol/week: 5.0 standard drinks of alcohol    Types: 5 Cans of beer per week   Drug use: No   Sexual activity: Not on file  Other Topics Concern   Not on file  Social History Narrative   Not on file   Social Determinants of Health   Financial Resource Strain: Not on file  Food Insecurity: Not on file  Transportation Needs: Not on file  Physical Activity: Not on file  Stress: Not on file  Social Connections: Not on file    Allergies:  No Known Allergies  Labs:  Results for orders placed or performed during the hospital encounter of 12/03/22 (from the past 48 hour(s))  Comprehensive metabolic panel     Status: Abnormal   Collection Time: 12/03/22  9:05 AM  Result Value Ref Range   Sodium 138 135 - 145 mmol/L   Potassium 2.9 (L) 3.5 - 5.1 mmol/L   Chloride 105 98 - 111 mmol/L    CO2 26 22 - 32 mmol/L   Glucose, Bld 124 (H) 70 - 99 mg/dL    Comment: Glucose reference range applies only to samples taken after fasting for at least 8 hours.   BUN 10 6 - 20 mg/dL   Creatinine, Ser 0.88 0.61 - 1.24 mg/dL   Calcium 8.6 (L) 8.9 - 10.3 mg/dL   Total Protein 6.8 6.5 - 8.1 g/dL   Albumin 3.7 3.5 - 5.0 g/dL   AST 29 15 - 41 U/L   ALT 23 0 - 44 U/L   Alkaline Phosphatase 67 38 - 126 U/L   Total Bilirubin 0.7 0.3 - 1.2 mg/dL   GFR, Estimated >60 >60 mL/min    Comment: (NOTE) Calculated using the CKD-EPI Creatinine Equation (2021)    Anion gap 7 5 - 15    Comment: Performed at  [facility], [place] [address], [place], [place] [address]  Urine rapid drug screen (hosp performed)     Status: Abnormal   Collection Time: 12/03/22  9:05 AM  Result Value Ref Range   Opiates NONE DETECTED NONE DETECTED   Cocaine NONE DETECTED NONE DETECTED   Benzodiazepines NONE DETECTED NONE DETECTED   Amphetamines POSITIVE (A) NONE DETECTED   Tetrahydrocannabinol POSITIVE (A) NONE DETECTED   Barbiturates NONE DETECTED NONE DETECTED    Comment: (NOTE) DRUG SCREEN FOR MEDICAL PURPOSES ONLY.  IF CONFIRMATION IS NEEDED FOR ANY PURPOSE, NOTIFY LAB WITHIN 5 DAYS.  LOWEST DETECTABLE LIMITS FOR URINE DRUG SCREEN Drug Class                     Cutoff (ng/mL) Amphetamine and metabolites    1000 Barbiturate and metabolites    200 Benzodiazepine                 200 Opiates and metabolites        300 Cocaine and metabolites        300 THC                            50 Performed at [facility], [place] [address], [place], [place] [address]   CBC with Diff     Status: Abnormal   Collection Time: 12/03/22  9:05 AM  Result Value Ref Range   WBC 5.6 4.0 - 10.5 K/uL   RBC 4.14 (L) 4.22 - 5.81 MIL/uL   Hemoglobin 12.4 (L) 13.0 - 17.0 g/dL   HCT 36.5 (L) 39.0 - 52.0 %   MCV 88.2 80.0 - 100.0 fL   MCH 30.0 26.0 - 34.0 pg   MCHC 34.0 30.0 - 36.0 g/dL   RDW 13.3  11.5 - 15.5 %   Platelets 317 150 - 400 K/uL   nRBC 0.0 0.0 - 0.2 %   Neutrophils Relative % 65 %   Neutro Abs 3.6 1.7 - 7.7 K/uL   Lymphocytes Relative 20 %   Lymphs Abs 1.1 0.7 - 4.0 K/uL   Monocytes Relative 9 %   Monocytes Absolute 0.5 0.1 - 1.0 K/uL   Eosinophils Relative 5 %   Eosinophils Absolute 0.3 0.0 - 0.5 K/uL   Basophils Relative 1 %   Basophils Absolute 0.1 0.0 - 0.1 K/uL   Immature Granulocytes 0 %   Abs Immature Granulocytes 0.01 0.00 - 0.07 K/uL    Comment: Performed at [facility], [place] [address] [place], [place] [address]    No current facility-administered medications for this encounter.   Current Outpatient Medications  Medication Sig Dispense Refill   potassium chloride SA (KLOR-CON M) 20 MEQ tablet Take 1 tablet (20 mEq total) by mouth 2 (two) times daily. 20 tablet 0   amphetamine-dextroamphetamine (ADDERALL) 20 MG tablet Take one tab in the morning and half tab in the evening 45 tablet 0   amphetamine-dextroamphetamine (ADDERALL) 20 MG tablet Take one tab in the morning and half tab in the evening 45 tablet 0   amphetamine-dextroamphetamine (ADDERALL) 20 MG tablet Take one tab in the morning and half tab in the evening 45 tablet 0    Musculoskeletal: Strength & Muscle Tone: within normal limits Gait & Station: normal Patient leans: N/A   Psychiatric Specialty Exam: Presentation  General Appearance:  Disheveled  Eye Contact: Good  Speech: Clear and Coherent  Speech Volume:  Normal  Handedness: Right   Mood and Affect  Mood: Euthymic  Affect: Appropriate   Thought Process  Thought Processes: Disorganized  Descriptions of Associations:Loose  Orientation:Full (Time, Place and Person)  Thought Content:Illogical  History of Schizophrenia/Schizoaffective disorder:No data recorded Duration of Psychotic Symptoms:No data recorded Hallucinations:Hallucinations: Other (comment)  Ideas of  Reference:Delusions  Suicidal Thoughts:Suicidal Thoughts: No  Homicidal Thoughts:Homicidal Thoughts: No   Sensorium  Memory: Immediate Good; Recent Good; Remote Fair  Judgment: Fair  Insight: Fair   Community education officer  Concentration: Fair  Attention Span: Good  Recall: Fair Play of Knowledge: Good  Language: Good   Psychomotor Activity  Psychomotor Activity: Psychomotor Activity: Normal   Assets  Assets: Communication Skills; Social Support    Sleep  Sleep: Sleep: Fair   Physical Exam: Physical Exam Vitals and nursing note reviewed.  Eyes:     Pupils: Pupils are equal, round, and reactive to light.  Pulmonary:     Effort: Pulmonary effort is normal.  Musculoskeletal:        General: Normal range of motion.     Cervical back: Normal range of motion.  Neurological:     Mental Status: He is alert.  Psychiatric:        Mood and Affect: Mood normal.        Thought Content: Thought content is delusional.        Cognition and Memory: Memory normal.        Judgment: Judgment normal.    Review of Systems  Constitutional: Negative.   HENT: Negative.    Respiratory: Negative.    Musculoskeletal: Negative.   Psychiatric/Behavioral: Negative.     Blood pressure 126/84, pulse 65, temperature 98.4 F (36.9 C), temperature source Oral, resp. rate 12, height 6' (1.829 m), weight 86.2 kg, SpO2 100 %. Body mass index is 25.77 kg/m.   Medical Decision Making: Patient is psychiatrically cleared. Patient case review and discussed with Dr. Dwyane Dee, and patient does not meet inpatient criteria for inpatient psychiatric treatment. At time of discharge, patient denies SI, HI, AVH and is able to contract for safety. He demonstrated no overt evidence of psychosis or mania. Prior to discharge, he verbalized that they understood warning signs, triggers, and symptoms of worsening mental health and how to access emergency mental health care if they felt it was  needed.  Patient is compliant and appropriate with provider, answering questions appropriately and cooperating, denies SI/HI/AVH, he is not an immediate danger to himself or anyone else, does not meet criteria for IVC.  Patient states he will follow-up with [facility] open Access for medication management. Patient was instructed to call 911 or return to the emergency room if they experienced any concerning symptoms after discharge. Patient voiced understanding and agreed to the above.  Patient given resources to follow up with behavioral health urgent care for therapy and medication management.    Disposition: Patient does not meet criteria for psychiatric inpatient admission. Supportive therapy provided about ongoing stressors. Discussed crisis plan, support from social network, calling 911, coming to the Emergency Department, and calling Suicide Hotline.   Michaele Offer, PMHNP 12/03/2022 2:37 PM

## 2022-12-03 NOTE — Discharge Instructions (Addendum)
Thank you for allowing me to be a part of your care today.    I am sending over a prescription for potassium to your pharmacy.  Please begin taking this tomorrow as your potassium here was low.  Your x-rays did show some mild degeneration in your left foot, but are otherwise normal.   I recommend following up with your primary care provider next week.   Return to the ED if you have worsening of your symptoms or if you have any new concerns.

## 2022-12-03 NOTE — ED Provider Notes (Signed)
[facility] Provider Note   CSN: [id number] Arrival date & time: 12/02/22  2356     History  Chief Complaint  Patient presents with   Feet Pain    William Romero is a 42 y.o. male with past medical history significant for ADHD and schizophrenia presents to the ED complaining of bilateral foot pain.  He denies any injury.  Patient reports that it "feels weird to walk on them, like the ground is moving".  Patient states that the changes in the geomagnetic fields affect his walking and make him unable to walk due to pain.  Patient was ambulatory in the ER without difficulty.  Patient states he has recently been sleeping in his car.  He denies auditory or visual hallucinations, however, he reports hearing voices and seeing "steam" where the voices come from.  Patient takes Adderall for his ADHD, but is not on any other medications.  Denies SI, HI.  Patient states he was seen in the ED for his bilateral foot pain in the past.     Home Medications Prior to Admission medications   Medication Sig Start Date End Date Taking? Authorizing Provider  potassium chloride SA (KLOR-CON M) 20 MEQ tablet Take 1 tablet (20 mEq total) by mouth 2 (two) times daily. 12/03/22  Yes Zanovia Rotz R, PA-C  amphetamine-dextroamphetamine (ADDERALL) 20 MG tablet Take one tab in the morning and half tab in the evening 09/26/22   Isaac Bliss, Rayford Halsted, MD  amphetamine-dextroamphetamine (ADDERALL) 20 MG tablet Take one tab in the morning and half tab in the evening 09/26/22   Isaac Bliss, Rayford Halsted, MD  amphetamine-dextroamphetamine (ADDERALL) 20 MG tablet Take one tab in the morning and half tab in the evening 09/26/22   Isaac Bliss, Rayford Halsted, MD      Allergies    Patient has no known allergies.    Review of Systems   Review of Systems  Musculoskeletal:        Bilateral foot pain  Psychiatric/Behavioral:  Positive for hallucinations. Negative for  agitation, behavioral problems, confusion, self-injury, sleep disturbance and suicidal ideas. The patient is not nervous/anxious.     Physical Exam Updated Vital Signs BP 123/79   Pulse 64   Temp 98.4 F (36.9 C) (Oral)   Resp 13   Ht 6' (1.829 m)   Wt 86.2 kg   SpO2 100%   BMI 25.77 kg/m  Physical Exam Vitals and nursing note reviewed.  Constitutional:      General: He is not in acute distress.    Appearance: Normal appearance. He is not ill-appearing or diaphoretic.  HENT:     Head:     Comments: Patient has multiple areas on the face with scabs and excoriation.  Cardiovascular:     Rate and Rhythm: Normal rate and regular rhythm.  Pulmonary:     Effort: Pulmonary effort is normal.  Musculoskeletal:     Right foot: Normal range of motion and normal capillary refill. No deformity, tenderness or bony tenderness. Normal pulse.     Left foot: Normal range of motion and normal capillary refill. No deformity, tenderness or bony tenderness. Normal pulse.  Neurological:     Mental Status: He is alert. Mental status is at baseline.     Gait: Gait is intact.     Comments: Patient able to ambulate without difficulty   Psychiatric:        Attention and Perception: He does not perceive auditory  or visual hallucinations.        Mood and Affect: Mood normal.        Speech: Speech is not rapid and pressured, slurred or tangential.        Behavior: Behavior normal. Behavior is cooperative.        Thought Content: Thought content is delusional.     ED Results / Procedures / Treatments   Labs (all labs ordered are listed, but only abnormal results are displayed) Labs Reviewed  COMPREHENSIVE METABOLIC PANEL - Abnormal; Notable for the following components:      Result Value   Potassium 2.9 (*)    Glucose, Bld 124 (*)    Calcium 8.6 (*)    All other components within normal limits  RAPID URINE DRUG SCREEN, HOSP PERFORMED - Abnormal; Notable for the following components:    Amphetamines POSITIVE (*)    Tetrahydrocannabinol POSITIVE (*)    All other components within normal limits  CBC WITH DIFFERENTIAL/PLATELET - Abnormal; Notable for the following components:   RBC 4.14 (*)    Hemoglobin 12.4 (*)    HCT 36.5 (*)    All other components within normal limits    EKG None  Radiology DG Foot Complete Left  Result Date: 12/03/2022 CLINICAL DATA:  Pain without trauma EXAM: LEFT FOOT - COMPLETE 3+ VIEW COMPARISON:  None Available. FINDINGS: Degenerative changes of the first metatarsophalangeal joint are moderate to marked. Mild hallux valgus deformity. Multiple hammertoe deformities. No acute fracture or dislocation. No periosteal reaction or callus deposition. IMPRESSION: Degenerative change, without acute osseous finding. Electronically Signed   By: Abigail Miyamoto M.D.   On: 12/03/2022 10:10   DG Foot Complete Right  Result Date: 12/03/2022 CLINICAL DATA:  Pain without trauma EXAM: RIGHT FOOT COMPLETE - 3+ VIEW COMPARISON:  06/15/2018 FINDINGS: Hammertoe deformities. No acute fracture or dislocation. Tiny Achilles spur. No periosteal reaction or callus deposition. IMPRESSION: No acute osseous abnormality. Electronically Signed   By: Abigail Miyamoto M.D.   On: 12/03/2022 10:09    Procedures Procedures    Medications Ordered in ED Medications  potassium chloride SA (KLOR-CON M) CR tablet 40 mEq (40 mEq Oral Given 12/03/22 1110)    ED Course/ Medical Decision Making/ A&P                             Medical Decision Making Amount and/or Complexity of Data Reviewed Labs: ordered. Radiology: ordered.  Risk Prescription drug management.   This patient presents to the ED with chief complaint(s) of bilateral foot pain due to changes in the geomagnetic fields with pertinent past medical history of ADHD, schizophrenia (not currently on medication).  The complaint involves an extensive differential diagnosis and also carries with it a high risk of complications and  morbidity.    The differential diagnosis includes acute psychosis, schizophrenia, delusional disorder, schizoaffective disorder, metabolic derangement, toxin ingestion   The initial plan is to obtain baseline labs and consult TTS due to nature of patient's complaint   Additional history obtained: Records reviewed Primary Care Documents, patient has seen family medicine, concerns for schizophrenia.  Visit on 11/01/22, Dr. Isaac Bliss believed patient needed evaluation by mental health providers, at that time patient refused referral.    Initial Assessment:   On exam, patient is resting comfortably and does not appear to be in acute distress.  He has multiple areas of scabbing to his face with excoriations.  PERRL.  Patient stating  his foot pain is related to geomagnetic fields.  He has circumstantial speech that is not pressured, delayed, or slurred.  He does not appear to be responding to internal stimuli at this time.  Foot exam is unremarkable.  Patient is able to stand and walk without difficulty.    Independent ECG/labs interpretation:  The following labs were independently interpreted:  CBC with mild anemia, no leukocytosis.  Metabolic panel with hypokalemia and hypocalcemia.  No other metabolic derangement. UDS is positive for THC and amphetamines.  Patient does take Adderall daily.  Independent visualization and interpretation of imaging: I independently visualized the following imaging with scope of interpretation limited to determining acute life threatening conditions related to emergency care: bilateral foot x-rays, which revealed degenerative changes to the left foot, but otherwise normal x-rays.  I agree with radiologist interpretation.  Treatment and Reassessment: Based on patient presentation and complaint, I feel that patient would benefit from TTS consult.  Patient has refused mental health referral in the past from his primary care provider.  He is agreeable today to speak  to someone.    Patient will be given oral potassium to help with hypokalemia.  Will send patient home with short course of potassium to help correct hypokalemia.   Consultations obtained: I requested consultation with psychiatry due to patient having complaints consistent with schizophrenia.  Patient was evaluated by Michaele Offer, PMHNP and was cleared.  Please see provider note for more detail.     Disposition:   I believe that patient is appropriate for discharge at this time.  Prescription for potassium sent to patient's pharmacy.   The patient has been appropriately medically screened and/or stabilized in the ED. I have low suspicion for any other emergent medical condition which would require further screening, evaluation or treatment in the ED or require inpatient management. At time of discharge the patient is hemodynamically stable and in no acute distress. I have discussed work-up results and diagnosis with patient and answered all questions. Patient is agreeable with discharge plan. We discussed strict return precautions for returning to the emergency department and they verbalized understanding.    Social Determinants of Health:   Patient's unhoused  increases the complexity of managing their presentation         Final Clinical Impression(s) / ED Diagnoses Final diagnoses:  Bilateral foot pain    Rx / DC Orders ED Discharge Orders          Ordered    potassium chloride SA (KLOR-CON M) 20 MEQ tablet  2 times daily        12/03/22 1423              Pat Kocher, PA-C 12/03/22 1427    Regan Lemming, MD 12/03/22 1521

## 2022-12-03 NOTE — ED Triage Notes (Signed)
Pt reports bilateral foot pain, no injury, reports "it feels weird to walk on them, like the ground is moving". Reports painful to walk. Pt ambulatory to triage w/o difficulty.

## 2022-12-26 ENCOUNTER — Encounter: Payer: Self-pay | Admitting: Internal Medicine

## 2022-12-26 NOTE — Telephone Encounter (Signed)
Error/njr °

## 2022-12-28 ENCOUNTER — Telehealth (INDEPENDENT_AMBULATORY_CARE_PROVIDER_SITE_OTHER): Payer: Self-pay | Admitting: Internal Medicine

## 2022-12-28 DIAGNOSIS — F900 Attention-deficit hyperactivity disorder, predominantly inattentive type: Secondary | ICD-10-CM

## 2022-12-28 MED ORDER — AMPHETAMINE-DEXTROAMPHETAMINE 20 MG PO TABS
ORAL_TABLET | ORAL | 0 refills | Status: DC
Start: 2022-12-28 — End: 2023-02-13

## 2022-12-28 MED ORDER — AMPHETAMINE-DEXTROAMPHETAMINE 20 MG PO TABS: ORAL_TABLET | ORAL | 0 refills | Status: DC

## 2022-12-28 NOTE — Progress Notes (Signed)
Virtual Visit via Video Note  I connected with William Romero on 12/28/22 at 11:00 AM EDT by a video enabled telemedicine application and verified that I am speaking with the correct person using two identifiers.  Location patient: home Location provider: work office Persons participating in the virtual visit: patient, provider  I discussed the limitations of evaluation and management by telemedicine and the availability of in person appointments. The patient expressed understanding and agreed to proceed.   HPI: This visit has been scheduled for the purpose of medication refills.  He has a history of ADHD and is on Adderall 20 mg of which he takes 1 tablet in the morning and half tablet in the afternoons as needed.  He is needing refills.  He would like me to treat him for his depression.  He wonders about Abilify.  He has a history of schizophrenia.  He is currently homeless.  Referrals to psychiatry have been placed by both myself and in the emergency department.  He has not followed through.   ROS: Negative unless indicated in HPI.  Past Medical History:  Diagnosis Date   Schizophrenia [facility])     Past Surgical History:  Procedure Laterality Date   APPENDECTOMY      No family history on file.  SOCIAL HX:   reports that he has quit smoking. His smoking use included cigarettes. He smoked an average of .5 packs per day. He has never used smokeless tobacco. He reports that he does not currently use alcohol after a past usage of about 5.0 standard drinks of alcohol per week. He reports that he does not use drugs.   Current Outpatient Medications:    potassium chloride SA (KLOR-CON M) 20 MEQ tablet, Take 1 tablet (20 mEq total) by mouth 2 (two) times daily., Disp: 20 tablet, Rfl: 0   amphetamine-dextroamphetamine (ADDERALL) 20 MG tablet, Take one tab in the morning and half tab in the evening, Disp: 45 tablet, Rfl: 0   amphetamine-dextroamphetamine (ADDERALL) 20 MG tablet,  Take one tab in the morning and half tab in the evening, Disp: 45 tablet, Rfl: 0   amphetamine-dextroamphetamine (ADDERALL) 20 MG tablet, Take one tab in the morning and half tab in the evening, Disp: 45 tablet, Rfl: 0  EXAM:   VITALS per patient if applicable: None reported  GENERAL: alert, oriented, appears well and in no acute distress  HEENT: atraumatic, conjunttiva clear, no obvious abnormalities on inspection of external nose and ears  NECK: normal movements of the head and neck  LUNGS: on inspection no signs of respiratory distress, breathing rate appears normal, no obvious gross increased work of breathing, gasping or wheezing  CV: no obvious cyanosis  MS: moves all visible extremities without noticeable abnormality  PSYCH/NEURO: pleasant and cooperative, no obvious depression or anxiety, speech and thought processing grossly intact  ASSESSMENT AND PLAN:   Attention deficit hyperactivity disorder (ADHD), predominantly inattentive type - Plan: amphetamine-dextroamphetamine (ADDERALL) 20 MG tablet, amphetamine-dextroamphetamine (ADDERALL) 20 MG tablet, amphetamine-dextroamphetamine (ADDERALL) 20 MG tablet  -PDMP reviewed, no red flags, overdose risk score is 380. -Refill Adderall 20 mg to take 1 tablet in the morning and 1/2 tablet in the afternoon as needed for a total of 45 tablets a month x 3 months. -I have explained to him that due to his history of schizophrenia I believe he would be better served being treated by psychiatry, he agrees.   I discussed the assessment and treatment plan with the patient. The patient was  provided an opportunity to ask questions and all were answered. The patient agreed with the plan and demonstrated an understanding of the instructions.   The patient was advised to call back or seek an in-person evaluation if the symptoms worsen or if the condition fails to improve as anticipated.    [name], MD  [facility] at  [facility]

## 2023-02-08 ENCOUNTER — Encounter (HOSPITAL_COMMUNITY): Payer: Self-pay

## 2023-02-08 ENCOUNTER — Emergency Department (HOSPITAL_COMMUNITY)
Admission: EM | Admit: 2023-02-08 | Discharge: 2023-02-08 | Disposition: A | Discharge status: Home / Self Care | Payer: Self-pay | Attending: Emergency Medicine | Admitting: Emergency Medicine

## 2023-02-08 ENCOUNTER — Other Ambulatory Visit: Payer: Self-pay

## 2023-02-08 DIAGNOSIS — R600 Localized edema: Secondary | ICD-10-CM

## 2023-02-08 DIAGNOSIS — D72819 Decreased white blood cell count, unspecified: Secondary | ICD-10-CM | POA: Insufficient documentation

## 2023-02-08 DIAGNOSIS — E876 Hypokalemia: Secondary | ICD-10-CM | POA: Insufficient documentation

## 2023-02-08 DIAGNOSIS — R5383 Other fatigue: Secondary | ICD-10-CM | POA: Insufficient documentation

## 2023-02-08 DIAGNOSIS — D649 Anemia, unspecified: Secondary | ICD-10-CM | POA: Insufficient documentation

## 2023-02-08 DIAGNOSIS — R2243 Localized swelling, mass and lump, lower limb, bilateral: Secondary | ICD-10-CM | POA: Insufficient documentation

## 2023-02-08 DIAGNOSIS — R7309 Other abnormal glucose: Secondary | ICD-10-CM | POA: Insufficient documentation

## 2023-02-08 LAB — CBC WITH DIFFERENTIAL/PLATELET
Abs Immature Granulocytes: 0.01 10*3/uL (ref 0.00–0.07)
Basophils Absolute: 0 10*3/uL (ref 0.0–0.1)
Basophils Relative: 1 %
Eosinophils Absolute: 0.2 10*3/uL (ref 0.0–0.5)
Eosinophils Relative: 6 %
HCT: 34.9 % — ABNORMAL LOW (ref 39.0–52.0)
Hemoglobin: 11.8 g/dL — ABNORMAL LOW (ref 13.0–17.0)
Immature Granulocytes: 0 %
Lymphocytes Relative: 32 %
Lymphs Abs: 1.2 10*3/uL (ref 0.7–4.0)
MCH: 30.3 pg (ref 26.0–34.0)
MCHC: 33.8 g/dL (ref 30.0–36.0)
MCV: 89.5 fL (ref 80.0–100.0)
Monocytes Absolute: 0.5 10*3/uL (ref 0.1–1.0)
Monocytes Relative: 13 %
Neutro Abs: 1.8 10*3/uL (ref 1.7–7.7)
Neutrophils Relative %: 48 %
Platelets: 324 10*3/uL (ref 150–400)
RBC: 3.9 MIL/uL — ABNORMAL LOW (ref 4.22–5.81)
RDW: 13.3 % (ref 11.5–15.5)
WBC: 3.8 10*3/uL — ABNORMAL LOW (ref 4.0–10.5)
nRBC: 0 % (ref 0.0–0.2)

## 2023-02-08 LAB — COMPREHENSIVE METABOLIC PANEL
ALT: 21 U/L (ref 0–44)
AST: 29 U/L (ref 15–41)
Albumin: 3.3 g/dL — ABNORMAL LOW (ref 3.5–5.0)
Alkaline Phosphatase: 70 U/L (ref 38–126)
Anion gap: 8 (ref 5–15)
BUN: 12 mg/dL (ref 6–20)
CO2: 24 mmol/L (ref 22–32)
Calcium: 8.2 mg/dL — ABNORMAL LOW (ref 8.9–10.3)
Chloride: 106 mmol/L (ref 98–111)
Creatinine, Ser: 0.96 mg/dL (ref 0.61–1.24)
GFR, Estimated: >60 mL/min (ref >60)
Glucose, Bld: 138 mg/dL — ABNORMAL HIGH (ref 70–99)
Potassium: 3.3 mmol/L — ABNORMAL LOW (ref 3.5–5.1)
Sodium: 138 mmol/L (ref 135–145)
Total Bilirubin: 0.5 mg/dL (ref 0.3–1.2)
Total Protein: 6.4 g/dL — ABNORMAL LOW (ref 6.5–8.1)

## 2023-02-08 LAB — BRAIN NATRIURETIC PEPTIDE: B Natriuretic Peptide: 16.3 pg/mL (ref 0.0–100.0)

## 2023-02-08 MED ORDER — POTASSIUM CHLORIDE CRYS ER 20 MEQ PO TBCR
40.0000 meq | EXTENDED_RELEASE_TABLET | Freq: Once | ORAL | Status: AC
  Administered 2023-02-08: 40 meq via ORAL
  Filled 2023-02-08: qty 4

## 2023-02-08 NOTE — Discharge Instructions (Addendum)
You were seen in the ER for evaluation of your lower leg swelling. I likely think that this is from venous insuffiencey and being on your feet for prolonged time. I recommended elevating your legs when you are not walking or wearing compression socks. Your lab work shows that your potassium is lower. I have given you some potassium today, but please make sure that you follow up with your PCP on this.  If you have any concerns, new or worsening symptoms, please return to the nearest emergency department for evaluation.  Contact a doctor if: Treatment is not working. You have heart, liver, or kidney disease and have symptoms of edema. You have sudden and unexplained weight gain. Get help right away if: You have shortness of breath or chest pain. You cannot breathe when you lie down. You have pain, redness, or warmth in the swollen areas. You have heart, liver, or kidney disease and get edema all of a sudden. You have a fever and your symptoms get worse all of a sudden. These symptoms may be an emergency. Get help right away. Call 911. Do not wait to see if the symptoms will go away. Do not drive yourself to the hospital.

## 2023-02-08 NOTE — ED Triage Notes (Signed)
Pt arrives c/o bilateral foot/lower leg swelling x3 days. States it started in the R ankle and has moved up leg and is now in the L leg as well. States minimal pain. Denies fevers/chills. States that he has walked over 10 miles over the past 3 days.

## 2023-02-08 NOTE — ED Provider Notes (Signed)
[facility] EMERGENCY DEPARTMENT AT [facility] Provider Note   CSN: [id number] Arrival date & time: 02/08/23  0208     History Chief Complaint  Patient presents with   Leg Swelling         William Romero is a 42 y.o. male with h/o ADD and schizophrenia presents to the ER for evaluation of bilateral lower leg swelling for the past 3 days.  Patient is currently experiencing homelessness and reports that he has been walking for over 10 hours a day for the past 3 to 4 days.  He denies any pain to his legs reports he did have some earlier that quickly resolved.  Denies any chest pain, shortness breath, fever, chills.  He reports that the swelling initially started in his right leg and then moved quickly over to his left leg.  He has more pain/fatigue with walking.  He has not tried anything for his symptoms.  He denies any history of any ADD, notably denies CHF specifically.  Takes Adderall daily.  No known drug allergies.  Occasional tobacco use.  Denies any alcohol, drug, or IV drug use.  HPI     Home Medications Prior to Admission medications   Medication Sig Start Date End Date Taking? Authorizing Provider  amphetamine-dextroamphetamine (ADDERALL) 20 MG tablet Take one tab in the morning and half tab in the evening 12/28/22   Philip Aspen, Limmie Patricia, MD  amphetamine-dextroamphetamine (ADDERALL) 20 MG tablet Take one tab in the morning and half tab in the evening 12/28/22   Philip Aspen, Limmie Patricia, MD  amphetamine-dextroamphetamine (ADDERALL) 20 MG tablet Take one tab in the morning and half tab in the evening 12/28/22   Philip Aspen, Limmie Patricia, MD  potassium chloride SA (KLOR-CON M) 20 MEQ tablet Take 1 tablet (20 mEq total) by mouth 2 (two) times daily. 12/03/22   Melton Alar R, PA-C      Allergies    Patient has no known allergies.    Review of Systems   Review of Systems  Constitutional:  Negative for chills and fever.  Respiratory:  Negative for shortness  of breath.   Cardiovascular:  Positive for leg swelling. Negative for chest pain and palpitations.  Gastrointestinal:  Negative for abdominal pain, diarrhea, nausea and vomiting.  Neurological:  Negative for weakness and numbness.    Physical Exam Updated Vital Signs BP 137/76 (BP Location: Left Arm)   Pulse 63   Temp (!) 97.5 F (36.4 C) (Oral)   Resp 18   SpO2 95%  Physical Exam Vitals and nursing note reviewed.  Constitutional:      General: He is not in acute distress.    Appearance: Normal appearance. He is not ill-appearing or toxic-appearing.  Eyes:     General: No scleral icterus. Pulmonary:     Effort: Pulmonary effort is normal. No respiratory distress.  Musculoskeletal:        General: No tenderness.     Right lower leg: Edema present.     Left lower leg: Edema present.     Comments: 1+ pitting edema bilaterally into the more proximal lower legs. Legs to appear symmetric in size and coloration. No increase in warmth. Palpable DP and PT pulses bilaterally. Compartments are soft.  He is ambulatory.  He does have some intact blisters seen at the plantar aspect of the bilateral feet.  No open wounds seen.  Nontender to palpation.  Skin:    General: Skin is dry.  Neurological:  General: No focal deficit present.     Mental Status: He is alert. Mental status is at baseline.     ED Results / Procedures / Treatments   Labs (all labs ordered are listed, but only abnormal results are displayed) Labs Reviewed  CBC WITH DIFFERENTIAL/PLATELET - Abnormal; Notable for the following components:      Result Value   WBC 3.8 (*)    RBC 3.90 (*)    Hemoglobin 11.8 (*)    HCT 34.9 (*)    All other components within normal limits  COMPREHENSIVE METABOLIC PANEL - Abnormal; Notable for the following components:   Potassium 3.3 (*)    Glucose, Bld 138 (*)    Calcium 8.2 (*)    Total Protein 6.4 (*)    Albumin 3.3 (*)    All other components within normal limits  BRAIN  NATRIURETIC PEPTIDE    EKG None  Radiology No results found.  Procedures Procedures   Medications Ordered in ED Medications  potassium chloride SA (KLOR-CON M) CR tablet 40 mEq (40 mEq Oral Given 02/08/23 0555)    ED Course/ Medical Decision Making/ A&P                           Medical Decision Making Amount and/or Complexity of Data Reviewed Labs: ordered.  Risk Prescription drug management.   42 y.o. male presents to the ER for evaluation of bilateral lower leg edema. Differential diagnosis includes but is not limited to CHF, venous insuffiencey, lymphedema, renal failure, DVT. Vital signs unremarkable. Physical exam as noted above.   Patient does not have any chest pain or shortness of breath, I doubt any CHF exacerbation.  Does report that the swelling was initially in his right leg but quickly moved over to his left leg as well.  Swelling was not rapidly worsening.  Denies any fevers, chest pain, or shortness of breath.  Will order labs.  I independently reviewed and interpreted the patient's labs.  CBC shows slight leukopenia 3.8.  Slight anemia 11.8.  CMP shows mild decrease in potassium 3.3, seems to be consistent for patient.  Glucose is slightly elevated 138.  Mildly decreased calcium, protein, and albumin.  Otherwise, no electrolyte or LFT abnormality.  BNP within normal limits.  I have low suspicion for any DVT given that his swelling is bilateral. He is otherwise PERC negative and low Wells Criteria. He is not not having any significant pain in his legs, only pain with ambulation towards the end of the day after he has been walking for 10 hours.  He has palpable pulses and is neurovascularly intact.  His compartments are soft, listed for any compartment syndrome.  Less likely CHF given he does not have any chest pain or shortness of breath and his BNP is within normal limits.  This is likely venous insufficiency from prolonged standing on his feet.  Will recommend  elevation, rest, and invest in compression stockings.  [facility] resources given as well.  We discussed the results of the labs/imaging. The plan is compression stockings, RICE. We discussed strict return precautions and red flag symptoms. The patient verbalized their understanding and agrees to the plan. The patient is stable and being discharged home in good condition.  Portions of this report may have been transcribed using voice recognition software. Every effort was made to ensure accuracy; however, inadvertent computerized transcription errors may be present.   Final Clinical Impression(s) / ED Diagnoses Final diagnoses:  Bilateral lower extremity edema  Hypokalemia    Rx / DC Orders ED Discharge Orders     None         Achille Rich, PA-C 02/08/23 1610    Shon Baton, MD 02/08/23 [id number]

## 2023-02-13 ENCOUNTER — Ambulatory Visit (HOSPITAL_COMMUNITY)
Admission: EM | Admit: 2023-02-13 | Discharge: 2023-02-14 | Disposition: A | Discharge status: Other Acute Inpatient Hospital | Payer: No Payment, Other | Attending: Psychiatry | Admitting: Psychiatry

## 2023-02-13 DIAGNOSIS — F209 Schizophrenia, unspecified: Secondary | ICD-10-CM | POA: Insufficient documentation

## 2023-02-13 DIAGNOSIS — Z5900 Homelessness unspecified: Secondary | ICD-10-CM | POA: Insufficient documentation

## 2023-02-13 LAB — TSH: TSH: 0.426 u[IU]/mL (ref 0.350–4.500)

## 2023-02-13 LAB — POCT URINE DRUG SCREEN - MANUAL ENTRY (I-SCREEN)
POC Amphetamine UR: NOT DETECTED
POC Buprenorphine (BUP): NOT DETECTED
POC Cocaine UR: NOT DETECTED
POC Marijuana UR: POSITIVE — AB
POC Methadone UR: NOT DETECTED
POC Methamphetamine UR: NOT DETECTED
POC Morphine: NOT DETECTED
POC Oxazepam (BZO): NOT DETECTED
POC Oxycodone UR: NOT DETECTED
POC Secobarbital (BAR): NOT DETECTED

## 2023-02-13 LAB — COMPREHENSIVE METABOLIC PANEL
ALT: 24 U/L (ref 0–44)
AST: 31 U/L (ref 15–41)
Albumin: 3.5 g/dL (ref 3.5–5.0)
Alkaline Phosphatase: 76 U/L (ref 38–126)
Anion gap: 12 (ref 5–15)
BUN: 13 mg/dL (ref 6–20)
CO2: 23 mmol/L (ref 22–32)
Calcium: 8.8 mg/dL — ABNORMAL LOW (ref 8.9–10.3)
Chloride: 107 mmol/L (ref 98–111)
Creatinine, Ser: 0.95 mg/dL (ref 0.61–1.24)
GFR, Estimated: >60 mL/min (ref >60)
Glucose, Bld: 139 mg/dL — ABNORMAL HIGH (ref 70–99)
Potassium: 4.2 mmol/L (ref 3.5–5.1)
Sodium: 142 mmol/L (ref 135–145)
Total Bilirubin: 0.3 mg/dL (ref 0.3–1.2)
Total Protein: 6.5 g/dL (ref 6.5–8.1)

## 2023-02-13 LAB — CBC WITH DIFFERENTIAL/PLATELET
Abs Immature Granulocytes: 0.03 10*3/uL (ref 0.00–0.07)
Basophils Absolute: 0.1 10*3/uL (ref 0.0–0.1)
Basophils Relative: 1 %
Eosinophils Absolute: 0.2 10*3/uL (ref 0.0–0.5)
Eosinophils Relative: 3 %
HCT: 37.9 % — ABNORMAL LOW (ref 39.0–52.0)
Hemoglobin: 12.2 g/dL — ABNORMAL LOW (ref 13.0–17.0)
Immature Granulocytes: 1 %
Lymphocytes Relative: 15 %
Lymphs Abs: 0.9 10*3/uL (ref 0.7–4.0)
MCH: 29.7 pg (ref 26.0–34.0)
MCHC: 32.2 g/dL (ref 30.0–36.0)
MCV: 92.2 fL (ref 80.0–100.0)
Monocytes Absolute: 0.5 10*3/uL (ref 0.1–1.0)
Monocytes Relative: 7 %
Neutro Abs: 4.7 10*3/uL (ref 1.7–7.7)
Neutrophils Relative %: 73 %
Platelets: 329 10*3/uL (ref 150–400)
RBC: 4.11 MIL/uL — ABNORMAL LOW (ref 4.22–5.81)
RDW: 14 % (ref 11.5–15.5)
WBC: 6.4 10*3/uL (ref 4.0–10.5)
nRBC: 0 % (ref 0.0–0.2)

## 2023-02-13 LAB — HEMOGLOBIN A1C
Hgb A1c MFr Bld: 5 % (ref 4.8–5.6)
Mean Plasma Glucose: 96.8 mg/dL

## 2023-02-13 LAB — LIPID PANEL
Cholesterol: 150 mg/dL (ref 0–200)
HDL: 53 mg/dL (ref >40)
LDL Cholesterol: 77 mg/dL (ref 0–99)
Total CHOL/HDL Ratio: 2.8 RATIO
Triglycerides: 100 mg/dL (ref <150)
VLDL: 20 mg/dL (ref 0–40)

## 2023-02-13 LAB — ETHANOL: Alcohol, Ethyl (B): <10 mg/dL (ref <10)

## 2023-02-13 LAB — MAGNESIUM: Magnesium: 2.3 mg/dL (ref 1.7–2.4)

## 2023-02-13 MED ORDER — MAGNESIUM HYDROXIDE 400 MG/5ML PO SUSP: 30.0000 mL | Freq: Every day | ORAL | Status: DC | PRN

## 2023-02-13 MED ORDER — TRAZODONE HCL 50 MG PO TABS: 50.0000 mg | ORAL_TABLET | Freq: Every evening | ORAL | Status: DC | PRN

## 2023-02-13 MED ORDER — PALIPERIDONE ER 6 MG PO TB24
6.0000 mg | ORAL_TABLET | Freq: Every day | ORAL | Status: DC
  Administered 2023-02-13 – 2023-02-14 (×2): 6 mg via ORAL
  Filled 2023-02-13 (×2): qty 1

## 2023-02-13 MED ORDER — ALUM & MAG HYDROXIDE-SIMETH 200-200-20 MG/5ML PO SUSP: 30.0000 mL | ORAL | Status: DC | PRN

## 2023-02-13 MED ORDER — HYDROXYZINE HCL 25 MG PO TABS: 25.0000 mg | ORAL_TABLET | Freq: Three times a day (TID) | ORAL | Status: DC | PRN

## 2023-02-13 MED ORDER — ACETAMINOPHEN 325 MG PO TABS: 650.0000 mg | ORAL_TABLET | Freq: Four times a day (QID) | ORAL | Status: DC | PRN

## 2023-02-13 NOTE — ED Notes (Signed)
Patient in milieu. Environment is secured. Will continue to monitor for safety. 

## 2023-02-13 NOTE — ED Notes (Signed)
Patient  sleeping in no acute stress. RR even and unlabored .Environment secured .Will continue to monitor for safely. 

## 2023-02-13 NOTE — Progress Notes (Signed)
   02/13/23 1107  [facility] Triage Screening (Walk-ins at [facility] only)  How Did You Hear About [place]? Self  What Is the Reason for Your Visit/Call Today? Pt presents to [facility] voluntarily unaccompanied due to worsening auditory hallucinations and paranoia. Pt reports his symptoms have been increasing over the past year but have gotten worse this week. Pt reports hearing a voice in his head daily challenging everything that he says. Pt states " she's trying to spite me". Pt reports he does not hear the voice currently but was hearing the voice earlier today. Pt reports he feels like someone is out to get him and he does not feel safe at this time. Pt denies SI/HI and visual hallucinations.  How Long Has This Been Causing You Problems? <Week  Have You Recently Had Any Thoughts About Hurting Yourself? No  Are You Planning to Commit Suicide/Harm Yourself At This time? No  Have you Recently Had Thoughts About Hurting Someone [name]? No  Are You Planning To Harm Someone At This Time? No  Are you currently experiencing any auditory, visual or other hallucinations? No  Have You Used Any Alcohol or Drugs in the Past 24 Hours? No  Do you have any current medical co-morbidities that require immediate attention? No  Clinician description of patient physical appearance/behavior: paranoid, anxious, cooperative  What Do You Feel Would Help You the Most Today? Treatment for Depression or other mood problem  If access to [facility] was not available, would you have sought care in the Emergency Department? No  Determination of Need Urgent (48 hours)  Options For Referral [facility] Urgent Care;Medication Management;Outpatient Therapy

## 2023-02-13 NOTE — Group Note (Signed)
Group Topic: Balance in Life  Group Date: 02/13/2023 Start Time: 2030 End Time: 2100 Facilitators: Guss Bunde  Department: [facility]  Number of Participants: 5  Group Focus: communication Treatment Modality:  Skills Training Interventions utilized were group exercise Purpose: enhance coping skills  Name: William Romero Date of Birth: 1981/07/18  MR: [id number]    Level of Participation: active Quality of Participation: attentive Interactions with others: gave feedback Mood/Affect: appropriate Triggers (if applicable):  Cognition: goal directed Progress: Moderate Response:  Plan: patient will be encouraged to   Patients Problems:  Patient Active Problem List   Diagnosis Date Noted   Schizophrenia (HCC) 11/01/2022   ADHD 03/28/2019   SOB (shortness of breath) 10/25/2018

## 2023-02-13 NOTE — Group Note (Unsigned)
Group Topic: Recovery Basics  Group Date: 02/13/2023 Start Time: 2030 End Time: 2045 Facilitators: Clodfelter-Simmons, Rondle Lohse Elbert Ewings, NT  Department: [facility]  Number of Participants: 8  Group Focus: anger management Treatment Modality:  Exposure Therapy Interventions utilized were exploration Purpose: express feelings   Name: William Romero Date of Birth: May 14, 1981  MR: [id number]    Level of Participation: {THERAPIES; PSYCH GROUP PARTICIPATION WUJWJ:19147} Quality of Participation: {THERAPIES; PSYCH QUALITY OF PARTICIPATION:23992} Interactions with others: {THERAPIES; PSYCH INTERACTIONS:23993} Mood/Affect: {THERAPIES; PSYCH MOOD/AFFECT:23994} Triggers (if applicable): *** Cognition: {THERAPIES; PSYCH COGNITION:23995} Progress: {THERAPIES; PSYCH PROGRESS:23997} Response: *** Plan: {THERAPIES; PSYCH WGNF:62130}  Patients Problems:  Patient Active Problem List   Diagnosis Date Noted   Schizophrenia (HCC) 11/01/2022   ADHD 03/28/2019   SOB (shortness of breath) 10/25/2018

## 2023-02-13 NOTE — Progress Notes (Signed)
[name] Progress Note  [id number]   William Romero  02/13/2023  1:24 PM  Description:   Inpatient Psychiatric Referral  Patient was recommended inpatient per Vernard Gambles, NP. There are no available beds at [facility], per [facility] Rona Ravens, RN. Patient was referred to the following out of network facilities:   [facility] Provider Address Phone Fax  [facility]  [address] [place] [address] [phone]  [facility]  [address] [place] [address] [phone]  [facility]  [address] [place] [address] [phone]  [facility]  [address] [place], [place] [address] [phone]  [facility] [address] [place]  [address] [place] [address] [phone]  [facility]  [address] [place], [place] [address] [phone]  [facility]  [address] [place] [address] [phone]  [facility]  [address] [place] [address] [phone]  [facility]  [address] [place] [address] [phone]  [facility]  [address] [place], [place] [address] [phone]  [facility]  [address] [place] [address] [phone]  [facility]  [address] [place] [address] [phone]  [facility]  [address] [place] [address] [phone]  [facility]  [address] [place] [address] [phone]  [facility]  [address], [place] [address] [phone]  [facility]  [address]  [place] [address] [phone]  [facility]  [address], [place] [address] [phone]  [facility]  [address], [place] [address] [phone]  [facility]  [address] [place] [address] [phone]  [facility]  [address] [place] [address] [phone]  [facility]  [address] [place] [address] [phone]  [facility]  [address] [place] [address] [phone]  [facility]  [address], [place] [address] [phone]  [facility]  [address], [place] [address] [phone]  [facility]  [address], [place] [address] [phone]    Situation ongoing, CSW to continue following and update chart as more information becomes available.      Cathie Beams, [name]  02/13/2023 1:24 PM
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
## 2023-02-13 NOTE — ED Notes (Signed)
STAT lab courier called to transport labs to [facility] lab 

## 2023-02-13 NOTE — ED Notes (Addendum)
Pt  admitted to obs denies SI/HI/ endorses Ah denies VH. Calm, cooperative throughout interview process. Skin assessment completed. Oriented to unit. Meal and drink offered. At currrent, pt continue to deniesSI/HI endorses Ah denies VH. Pt verbally contract for safety. Will monitor for safety.

## 2023-02-13 NOTE — Group Note (Unsigned)
Group Topic: Balance in Life  Group Date: 02/13/2023 Start Time: 2030 End Time: 2100 Facilitators: Guss Bunde  Department: [facility]  Number of Participants: 5  Group Focus: communication Treatment Modality:  Skills Training Interventions utilized were group exercise Purpose: improve communication skills   Name: Remmington Urieta Date of Birth: 1980-12-18  MR: [id number]    Level of Participation: {THERAPIES; PSYCH GROUP PARTICIPATION KGMWN:02725} Quality of Participation: {THERAPIES; PSYCH QUALITY OF PARTICIPATION:23992} Interactions with others: {THERAPIES; PSYCH INTERACTIONS:23993} Mood/Affect: {THERAPIES; PSYCH MOOD/AFFECT:23994} Triggers (if applicable): *** Cognition: {THERAPIES; PSYCH COGNITION:23995} Progress: {THERAPIES; PSYCH PROGRESS:23997} Response: *** Plan: {THERAPIES; PSYCH DGUY:40347}  Patients Problems:  Patient Active Problem List   Diagnosis Date Noted   Schizophrenia (HCC) 11/01/2022   ADHD 03/28/2019   SOB (shortness of breath) 10/25/2018

## 2023-02-13 NOTE — ED Notes (Signed)
Pt sleeping@this time. Breathing even and unlabored. Will continue to monitor for safety 

## 2023-02-13 NOTE — BH Assessment (Signed)
Comprehensive Clinical Assessment (CCA) Note  02/13/2023 William Romero [place] [id number]  Disposition: Per Vernard Gambles NP, patient is recommended for inpatient treatment.   The patient demonstrates the following risk factors for suicide: Chronic risk factors for suicide include: psychiatric disorder of history of schizophrenia . Acute risk factors for suicide include: family or marital conflict, unemployment, and loss (financial, interpersonal, professional). Protective factors for this patient include: hope for the future. Considering these factors, the overall suicide risk at this point appears to be low. Patient is not appropriate for outpatient follow up.  Chief Complaint:  Chief Complaint  Patient presents with   Paranoid   Hallucinations   Visit Diagnosis: Schizophrenia, unspecified type (HCC)     CCA Screening, Triage and Referral (STR)  Patient Reported Information How did you hear about [place]? Self  What Is the Reason for Your Visit/Call Today? Pt presents to [facility] voluntarily unaccompanied due to worsening auditory hallucinations and paranoia. Pt reports his symptoms have been increasing over the past year but have gotten worse this week. Pt reports hearing a voice in his head daily challenging everything that he says. Pt states " she's trying to spite me". Pt reports he does not hear the voice currently but was hearing the voice earlier today. Pt reports he feels like someone is out to get him and he does not feel safe at this time. Pt denies SI/HI and visual hallucinations.  Patient reports the hallucinations started almost a year ago triggered by someone throwing a bell out of their car window (possible symptom of idea of reference). Patient reports non-command AH of several voices having conversation and reports they are "agnostic", mostly hearing a male voice that "counters" everything he says. Patient also reports that the voices are "cruel". TTS asked patient about his  religious affiliation, however patient refuses to discuss his religion. Patient feels that AH "is common with homelessness". Patient reports that he has been homeless for the past two years and living in his car until it broke down recently. Patient states that his mother was helping him to stay in motels but for the past three days he has been sleeping on the street.  Patient also reports paranoia, feeling that everyone in the community is following him. Patient agrees that people are listening to him on his phone, but he reports "no more than usual". Patient also feels like people can read his thoughts but denies that he can read other people thoughts. Patient is somewhat guarded about the information that he is sharing.   Patient is single and has no children. Patient mother and two brothers live in the area but appears there is some conflict between the family after his father died some time ago. Patient reports diagnosis of ADHD and depression. Patient reports 6-7 inpatient stays between the years of 2012 to 2019 which were mostly involuntary commitments. Patient does not have outpatient services however, per chart review has a history of ACTT which are enhanced services for individuals' diagnosis with a SPMI. Patient denies access to a firearm, and he denies legal issues.   Patient is oriented x4, engaged, alert and cooperative and somewhat guarded during the assessment. Patient has a flat affect and euthymic mood. Patient denies SI, HI, VH and reports he drunk three 22-ounce beers yesterday for the first time in about a month. Patient denies frequent/heavy alcohol and active substance use.  Patient possibly in the early stage of psychosis and could be minimizing his symptoms.  Patient does not appear  to be responding to internal stimuli.   How Long Has This Been Causing You Problems? <Week  What Do You Feel Would Help You the Most Today? Treatment for Depression or other mood problem   Have You  Recently Had Any Thoughts About Hurting Yourself? No  Are You Planning to Commit Suicide/Harm Yourself At This time? No   Flowsheet Row ED from 02/13/2023 in [facility] ED from 02/08/2023 in [facility] at [facility] ED from 12/03/2022 in [facility] Department [facility]  C-SSRS RISK CATEGORY No Risk No Risk No Risk       Have you Recently Had Thoughts About Hurting Someone [name]? No  Are You Planning to Harm Someone at This Time? No  Explanation: No data recorded  Have You Used Any Alcohol or Drugs in the Past 24 Hours? No  What Did You Use and How Much? No data recorded  Do You Currently Have a Therapist/Psychiatrist? No  Name of Therapist/Psychiatrist: Name of Therapist/Psychiatrist: NA   Have You Been Recently Discharged From Any Office Practice or Programs? No  Explanation of Discharge From Practice/Program: NA     CCA Screening Triage Referral Assessment Type of Contact: Face-to-Face  Telemedicine Service Delivery:   Is this Initial or Reassessment?   Date Telepsych consult ordered in [facility]:    Time Telepsych consult ordered in [facility]:    Location of Assessment: [facility]  Provider Location: [facility] Assessment Services   Collateral Involvement: PATIENT REFUSES FOR [place] TO CONTACT HIS MOTHER FOR COLLATERAL   Does Patient Have a Automotive engineer Guardian? No  Legal Guardian Contact Information: NA  Copy of Legal Guardianship Form: -- (NA)  Legal Guardian Notified of Arrival: -- (NA)  Legal Guardian Notified of Pending Discharge: -- (NA)  If Minor and Not Living with Parent(s), Who has Custody? NA  Is CPS involved or ever been involved? Never  Is APS involved or ever been involved? Never   Patient Determined To Be At Risk for Harm To Self or Others Based on Review of Patient Reported Information or Presenting Complaint? No  Method: No Plan  Availability  of Means: No access or NA  Intent: Vague intent or NA  Notification Required: No need or identified person  Additional Information for Danger to Others Potential: No data recorded Additional Comments for Danger to Others Potential: NA  Are There Guns or Other Weapons in Your Home? No  Types of Guns/Weapons: NA  Are These Weapons Safely Secured?                            -- (NA)  Who Could Verify You Are Able To Have These Secured: NA  Do You Have any Outstanding Charges, Pending Court Dates, Parole/Probation? DENIES  Contacted To Inform of Risk of Harm To Self or Others: No data recorded   Does Patient Present under Involuntary Commitment? No    [place] of Residence: [place]   Patient Currently Receiving the Following Services: Not Receiving Services   Determination of Need: Urgent (48 hours)   Options For Referral: [facility]; Medication Management; Outpatient Therapy     CCA Biopsychosocial Patient Reported Schizophrenia/Schizoaffective Diagnosis in Past: Yes   Strengths: UNKNOWN   Mental Health Symptoms Depression:   None   Duration of Depressive symptoms:    Mania:   None   Anxiety:    Worrying; Tension;  Sleep; Irritability; Difficulty concentrating   Psychosis:   Affective flattening/alogia/avolition; Hallucinations   Duration of Psychotic symptoms:  Duration of Psychotic Symptoms: Less than six months   Trauma:   None   Obsessions:   None   Compulsions:   None   Inattention:   None   Hyperactivity/Impulsivity:   None   Oppositional/Defiant Behaviors:   None   Emotional Irregularity:   None   Other Mood/Personality Symptoms:  No data recorded   Mental Status Exam Appearance and self-care  Stature:   Average   Weight:   Average weight   Clothing:   Dirty   Grooming:   Neglected   Cosmetic use:   None   Posture/gait:   Normal   Motor activity:   Not Remarkable   Sensorium  Attention:   Normal    Concentration:   Normal   Orientation:   X5   Recall/memory:   Normal   Affect and Mood  Affect:   Blunted; Flat   Mood:   Anxious   Relating  Eye contact:   Normal   Facial expression:   Constricted   Attitude toward examiner:   Guarded; Cooperative   Thought and Language  Speech flow:  Clear and Coherent   Thought content:   Ideas of Reference   Preoccupation:   None   Hallucinations:   Auditory   Organization:   Engineer, site of Knowledge:   Fair   Intelligence:   Average   Abstraction:   Normal   Judgement:   Fair   Dance movement psychotherapist:   Adequate   Insight:   Fair   Decision Making:   Normal   Social Functioning  Social Maturity:   Irresponsible   Social Judgement:   Normal   Stress  Stressors:   Housing; Family conflict; Financial   Coping Ability:   Overwhelmed; Exhausted   Skill Deficits:   None   Supports:   Support needed; Friends/Service system     Religion: Religion/Spirituality Are You A Religious Person?: Yes (PATIENT DOES NOT WANT TO DISCLOSE HIS RELIGIOUS AFFILIATION) How Might This Affect Treatment?: NA  Leisure/Recreation: Leisure / Recreation Do You Have Hobbies?: Yes Leisure and Hobbies: [employer]UR[employer]LD [employer]TEAMS  Exercise/Diet: Exercise/Diet Do You Exercise?: No Have You Gained or Lost A Significant Amount of Weight in the Past Six Months?: No Do You Follow a Special Diet?: No Do You Have Any Trouble Sleeping?: Yes Explanation of Sleeping Difficulties: POOR SLEEP PAST COUPLE DAYS   CCA Employment/Education Employment/Work Situation: Employment / Work Situation Employment Situation: Unemployed Patient's Job has Been Impacted by Current Illness: No Has Patient ever Been in Equities trader?: No  Education: Education Is Patient Currently Attending School?: No Did Theme park manager?: Yes What Type of College Degree Do you Have?: 2 YEARS IN APPLIED SCIENCE Did You  Have An Individualized Education Program (IIEP): No Did You Have Any Difficulty At School?: No Patient's Education Has Been Impacted by Current Illness: No   CCA Family/Childhood History Family and Relationship History: Family history Marital status: Single Does patient have children?: No  Childhood History:  Childhood History By whom was/is the patient raised?: Both parents Did patient suffer any verbal/emotional/physical/sexual abuse as a child?: No Did patient suffer from severe childhood neglect?: No Has patient ever been sexually abused/assaulted/raped as an adolescent or adult?: No Was the patient ever a victim of a crime or a disaster?: No Witnessed domestic violence?: No Has patient been affected by  domestic violence as an adult?: No       CCA Substance Use Alcohol/Drug Use: Alcohol / Drug Use Pain Medications: SEE MAR Prescriptions: SEE MAR Over the Counter: SEE MAR History of alcohol / drug use?: No history of alcohol / drug abuse                         ASAM's:  Six Dimensions of Multidimensional Assessment  Dimension 1:  Acute Intoxication and/or Withdrawal Potential:      Dimension 2:  Biomedical Conditions and Complications:      Dimension 3:  Emotional, Behavioral, or Cognitive Conditions and Complications:     Dimension 4:  Readiness to Change:     Dimension 5:  Relapse, Continued use, or Continued Problem Potential:     Dimension 6:  Recovery/Living Environment:     ASAM Severity Score:    ASAM Recommended Level of Treatment:     Substance use Disorder (SUD)    Recommendations for Services/Supports/Treatments:    Discharge Disposition: Discharge Disposition Medical Exam completed: Yes Disposition of Patient: Admit Mode of transportation if patient is discharged/movement?: Walking  DSM5 Diagnoses: Patient Active Problem List   Diagnosis Date Noted   Schizophrenia (HCC) 11/01/2022   ADHD 03/28/2019   SOB (shortness of breath)  10/25/2018     Referrals to Alternative Service(s): Referred to Alternative Service(s):   Place:   Date:   Time:    Referred to Alternative Service(s):   Place:   Date:   Time:    Referred to Alternative Service(s):   Place:   Date:   Time:    Referred to Alternative Service(s):   Place:   Date:   Time:     Audree Camel, [facility]

## 2023-02-13 NOTE — ED Notes (Signed)
Pt was given a hot meal. Meatloaf, veggies, cookie,and juice.

## 2023-02-14 ENCOUNTER — Other Ambulatory Visit: Payer: Self-pay

## 2023-02-14 ENCOUNTER — Encounter (HOSPITAL_COMMUNITY): Payer: Self-pay | Admitting: Psychiatry

## 2023-02-14 ENCOUNTER — Inpatient Hospital Stay (HOSPITAL_COMMUNITY)
Admission: AD | Admit: 2023-02-14 | Discharge: 2023-02-22 | DRG: 885 | Disposition: A | Discharge status: Home / Self Care | Payer: No Typology Code available for payment source | Source: Intra-hospital | Attending: Psychiatry | Admitting: Psychiatry

## 2023-02-14 DIAGNOSIS — F1290 Cannabis use, unspecified, uncomplicated: Secondary | ICD-10-CM | POA: Diagnosis present

## 2023-02-14 DIAGNOSIS — F329 Major depressive disorder, single episode, unspecified: Secondary | ICD-10-CM | POA: Diagnosis present

## 2023-02-14 DIAGNOSIS — F909 Attention-deficit hyperactivity disorder, unspecified type: Secondary | ICD-10-CM | POA: Diagnosis present

## 2023-02-14 DIAGNOSIS — F209 Schizophrenia, unspecified: Principal | ICD-10-CM | POA: Diagnosis present

## 2023-02-14 DIAGNOSIS — Z5902 Unsheltered homelessness: Secondary | ICD-10-CM | POA: Diagnosis not present

## 2023-02-14 DIAGNOSIS — Z79899 Other long term (current) drug therapy: Secondary | ICD-10-CM

## 2023-02-14 DIAGNOSIS — Z87891 Personal history of nicotine dependence: Secondary | ICD-10-CM | POA: Diagnosis not present

## 2023-02-14 DIAGNOSIS — F1210 Cannabis abuse, uncomplicated: Secondary | ICD-10-CM | POA: Diagnosis present

## 2023-02-14 DIAGNOSIS — F200 Paranoid schizophrenia: Secondary | ICD-10-CM | POA: Diagnosis not present

## 2023-02-14 DIAGNOSIS — F2081 Schizophreniform disorder: Principal | ICD-10-CM | POA: Diagnosis present

## 2023-02-14 DIAGNOSIS — F17200 Nicotine dependence, unspecified, uncomplicated: Secondary | ICD-10-CM | POA: Diagnosis present

## 2023-02-14 MED ORDER — LORAZEPAM 2 MG/ML IJ SOLN: 2.0000 mg | Freq: Three times a day (TID) | INTRAMUSCULAR | Status: DC | PRN

## 2023-02-14 MED ORDER — TRAZODONE HCL 50 MG PO TABS
50.0000 mg | ORAL_TABLET | Freq: Every evening | ORAL | Status: DC | PRN
  Administered 2023-02-15 – 2023-02-21 (×7): 50 mg via ORAL
  Filled 2023-02-14 (×3): qty 1
  Filled 2023-02-14: qty 7
  Filled 2023-02-14 (×5): qty 1

## 2023-02-14 MED ORDER — PALIPERIDONE ER 6 MG PO TB24: 6.0000 mg | ORAL_TABLET | Freq: Every day | ORAL | Status: DC

## 2023-02-14 MED ORDER — LORAZEPAM 1 MG PO TABS: 2.0000 mg | ORAL_TABLET | Freq: Three times a day (TID) | ORAL | Status: DC | PRN

## 2023-02-14 MED ORDER — ACETAMINOPHEN 325 MG PO TABS: 650.0000 mg | ORAL_TABLET | Freq: Four times a day (QID) | ORAL | Status: DC | PRN

## 2023-02-14 MED ORDER — HYDROXYZINE HCL 25 MG PO TABS
25.0000 mg | ORAL_TABLET | Freq: Three times a day (TID) | ORAL | Status: DC | PRN
  Administered 2023-02-15 – 2023-02-22 (×15): 25 mg via ORAL
  Filled 2023-02-14 (×3): qty 1
  Filled 2023-02-14: qty 10
  Filled 2023-02-14 (×13): qty 1

## 2023-02-14 MED ORDER — HALOPERIDOL LACTATE 5 MG/ML IJ SOLN: 5.0000 mg | Freq: Three times a day (TID) | INTRAMUSCULAR | Status: DC | PRN

## 2023-02-14 MED ORDER — DIPHENHYDRAMINE HCL 25 MG PO CAPS
50.0000 mg | ORAL_CAPSULE | Freq: Three times a day (TID) | ORAL | Status: DC | PRN
  Filled 2023-02-14: qty 2

## 2023-02-14 MED ORDER — HALOPERIDOL 5 MG PO TABS: 5.0000 mg | ORAL_TABLET | Freq: Three times a day (TID) | ORAL | Status: DC | PRN

## 2023-02-14 MED ORDER — DIPHENHYDRAMINE HCL 50 MG/ML IJ SOLN: 50.0000 mg | Freq: Three times a day (TID) | INTRAMUSCULAR | Status: DC | PRN

## 2023-02-14 MED ORDER — PALIPERIDONE ER 6 MG PO TB24
6.0000 mg | ORAL_TABLET | Freq: Every day | ORAL | Status: DC
  Administered 2023-02-15: 6 mg via ORAL
  Filled 2023-02-14 (×3): qty 1

## 2023-02-14 MED ORDER — MAGNESIUM HYDROXIDE 400 MG/5ML PO SUSP: 30.0000 mL | Freq: Every day | ORAL | Status: DC | PRN

## 2023-02-14 MED ORDER — ALUM & MAG HYDROXIDE-SIMETH 200-200-20 MG/5ML PO SUSP: 30.0000 mL | ORAL | Status: DC | PRN

## 2023-02-14 NOTE — Group Note (Deleted)
Recreation Therapy Group Note   Group Topic:Animal Assisted Therapy   Group Date: 02/14/2023 Start Time: 0945 End Time: 1030 Facilitators: Iliani Vejar-McCall, Shellby Schlink A, NT Location: [address] Dayroom       Affect/Mood: {RT B[facility] Affect/Mood:26271}   Participation Level: {RT BHH Participation [employer]   Participation Quality: {RT [facility] Participation Quality:26268}   Behavior: {RT [facility] Group Behavior:26269}   Speech/Thought Process: {RT B[facility] Speech/Thought:26276}   Insight: {RT BHH Insight:26272}   Judgement: {RT [facility] Judgement:26278}   Modes of Intervention: {RT [facility] Modes of Intervention:26277}   Patient Response to Interventions:  {RT BHH Patient Response to Intervention:26274}   Education Outcome:  {RT [facility] Education Outcome:26279}   Clinical Observations/Individualized Feedback: *** was *** in their participation of session activities and group discussion. Pt identified ***   Plan: {RT [facility] Tx Plan:26280}   Larrell Rapozo A Nadiya Pieratt-McCall, NT,  02/14/2023 1:04 PM

## 2023-02-14 NOTE — Progress Notes (Signed)
Pt was accepted to [facility] TODAY 02/14/2023, pending signed voluntary consent faxed to [phone]. Bed assignment: 407-2  Pt meets inpatient criteria per Vernard Gambles, NP  Attending Physician will be Phineas Inches, MD  Report can be called to: - Adult unit: [phone]  Pt can arrive after pending items are received  Care Team Notified: [facility] Rona Ravens, RN, Feliberto Harts, RN, Harless Litten, RN, Vernard Gambles, NP, and Laretta Alstrom, LPN  [place], [name]  02/14/2023 9:44 AM

## 2023-02-14 NOTE — Progress Notes (Signed)
Pt did not participate in group discussion.

## 2023-02-14 NOTE — ED Notes (Signed)
Report called to Heather RN at [facility] 

## 2023-02-14 NOTE — Progress Notes (Signed)
   02/14/23 2000  Psych Admission Type (Psych Patients Only)  Admission Status Voluntary  Psychosocial Assessment  Patient Complaints Anxiety  Eye Contact Fair  Facial Expression Flat  Affect Appropriate to circumstance  Speech Logical/coherent  Interaction Cautious  Motor Activity Slow  Appearance/Hygiene In scrubs  Behavior Characteristics Cooperative;Appropriate to situation  Mood Sad  Thought Process  Coherency WDL  Content Paranoia  Delusions Paranoid  Perception Hallucinations  Hallucination Auditory  Judgment Impaired  Confusion None  Danger to Self  Current suicidal ideation? Denies  Agreement Not to Harm Self Yes  Description of Agreement verbal  Danger to Others  Danger to Others None reported or observed   Alert/oriented. Makes needs/concerns known to staff. Pleasant cooperative with staff. Denies SI/HI/A/V hallucinations. Patient states went to group. Will encourage compliance and progression towards goals. Verbally contracted for safety. Will continue to monitor.

## 2023-02-14 NOTE — Progress Notes (Signed)
Patient admitted voluntarily to 407-2 for paranoia and AH. Patient denies SI, HI, VH. Patient reports he hears 3 women that talk and make him paranoid. When asked what happened to get him to the hospital he reports he just "felt like it was time". Patient reports he is homeless and his car stopped working. He reports he has no support system. He reports he rarely smokes cigarettes/vapes and would not like a patch or gum while he is here. He reports monthly alcohol use. He denies any history of abuse. He is a low fall risk. Skin assessment unremarkable. Belongings searched. Patient oriented to the unit. Food and drink provided. Safety maintained.

## 2023-02-14 NOTE — Plan of Care (Signed)
  Problem: Safety: Goal: Periods of time without injury will increase Outcome: Progressing   

## 2023-02-14 NOTE — ED Notes (Signed)
Patient has been calm and cooperative this shift. Appears to be responding to internal stimuli at times but is redirectable and able to answer questions. He is willing to go to [facility] for inpatient treatment, voluntarily. Voluntary form faxed to [phone].

## 2023-02-14 NOTE — Tx Team (Signed)
Initial Treatment Plan 02/14/2023 3:55 PM William Romero ZOX:[id number]    PATIENT STRESSORS: Financial difficulties     PATIENT STRENGTHS: Careers information officer for treatment/growth    PATIENT IDENTIFIED PROBLEMS: "Coping skills"  "Paranoia"                   DISCHARGE CRITERIA:  Improved stabilization in mood, thinking, and/or behavior  PRELIMINARY DISCHARGE PLAN: Return to previous living arrangement  PATIENT/FAMILY INVOLVEMENT: This treatment plan has been presented to and reviewed with the patient, William Romero.  The patient has been given the opportunity to ask questions and make suggestions.  Edwyna Perfect, RN 02/14/2023, 3:55 PM

## 2023-02-14 NOTE — Discharge Instructions (Signed)
Patient will be transferred to [facility] for inpatient psychiatric admission  ?

## 2023-02-14 NOTE — ED Notes (Signed)
Pt sleeping@this time. Breathing even and unlabored. Will continue to monitor for safety 

## 2023-02-14 NOTE — ED Provider Notes (Signed)
FBC/OBS ASAP Discharge Summary  Date and Time: 02/14/2023 5:29 PM  Name: William Romero  MRN:  [id number]   Discharge Diagnoses:  Final diagnoses:  Schizophrenia, unspecified type [facility])   HPI: patient presented to [facility] as a walk in unaccompanied with complaints of auditory hallucinations.   Per chart review patient has a past psychiatric history of schizophrenia and ADHD.  Reports between 2012-2019 he has had roughly 6-7 psychiatric hospitalizations.  Reports he has been placed under involuntary commitment many times.  States the only medication that he takes is Adderall.  Per PDMP he is prescribed Adderall 20 mg daily.  This is prescribed by his primary care physician Dr. Loreta Romero.  He currently has no outpatient psychiatric services in place.  He is denying any medical concerns.  He is homeless.  He initially denied any substance use.  However he does admit to drinking occasionally.  His last drink was roughly 2 days ago he consumed 3 beers.   William Romero, 42 y.o., male patient seen face to face by this provider and chart reviewed on 02/14/23.    Subjective:   On assessment patient is observed laying in his bed awake.  He is alert/oriented x 4.  He continues to be guarded when answering questions.  He is paranoid and looks around the room.  He continues to be slightly disorganized.  He endorses auditory hallucinations but states they are a little improved.  He continues to hear male and male voices having a conversation.  He denies HI/SI/VH.  He does not appear to be responding to internal/external stimuli.  He is tolerating the Invega without any adverse reactions.  Reports he was able to get some sleep while on the unit.   Stay Summary: Patient has remained calm and cooperative on the unit.  He has not required any as needed medications for agitation.  He continues to be in agreement for inpatient psychiatric admission.  He has been accepted to [facility].  He remains voluntary  at this time.  Total Time spent with patient: 30 minutes  Past Psychiatric History: see h&p Past Medical History: see h&p Family History: see h&p Family Psychiatric History: see h&p Social History: see h&p Tobacco Cessation:  N/A, patient does not currently use tobacco products  Current Medications:  No current facility-administered medications for this encounter.   No current outpatient medications on file.   Facility-Administered Medications Ordered in Other Encounters  Medication Dose Route Frequency Provider Last Rate Last Admin   acetaminophen (TYLENOL) tablet 650 mg  650 mg Oral Q6H PRN William Hughs, NP       alum & mag hydroxide-simeth (MAALOX/MYLANTA) 200-200-20 MG/5ML suspension 30 mL  30 mL Oral Q4H PRN William Hughs, NP       diphenhydrAMINE (BENADRYL) capsule 50 mg  50 mg Oral TID PRN William Hughs, NP       Or   diphenhydrAMINE (BENADRYL) injection 50 mg  50 mg Intramuscular TID PRN William Hughs, NP       haloperidol (HALDOL) tablet 5 mg  5 mg Oral TID PRN William Hughs, NP       Or   haloperidol lactate (HALDOL) injection 5 mg  5 mg Intramuscular TID PRN William Hughs, NP       hydrOXYzine (ATARAX) tablet 25 mg  25 mg Oral TID PRN William Hughs, NP       LORazepam (ATIVAN) tablet 2 mg  2 mg Oral TID  PRN William Hughs, NP       Or   LORazepam (ATIVAN) injection 2 mg  2 mg Intramuscular TID PRN William Hughs, NP       magnesium hydroxide (MILK OF MAGNESIA) suspension 30 mL  30 mL Oral Daily PRN William Hughs, NP       [START ON 02/15/2023] paliperidone (INVEGA) 24 hr tablet 6 mg  6 mg Oral Daily William Hughs, NP       traZODone (DESYREL) tablet 50 mg  50 mg Oral QHS PRN William Hughs, NP        PTA Medications:  PTA Medications  Medication Sig   amphetamine-dextroamphetamine (ADDERALL) 20 MG tablet Take one tab in the morning and half tab in the evening (Patient taking differently: Take 10-20 mg by mouth 2  (two) times daily. Take one tablet in the morning and half a tablet after lunch.)   [START ON 02/15/2023] paliperidone (INVEGA) 6 MG 24 hr tablet Take 1 tablet (6 mg total) by mouth daily.       09/26/2022    3:43 PM 06/21/2022    8:20 AM 01/03/2022   11:30 AM  Depression screen PHQ 2/9  Decreased Interest 0 0 0  Down, Depressed, Hopeless 0 0 0  PHQ - 2 Score 0 0 0  Altered sleeping 0 0 0  Tired, decreased energy 0 0 0  Change in appetite 0 0 0  Feeling bad or failure about yourself  0 0 0  Trouble concentrating 0 0 0  Moving slowly or fidgety/restless 0 0 0  Suicidal thoughts 0 0 0  PHQ-9 Score 0 0 0  Difficult doing work/chores Not difficult at all Not difficult at all Not difficult at all    [facility] (Current[facility]13/2023 in [facility] ED from 02/08/2023 in [facility] Emergency Department at [facility]  C-SSRS RISK CATEGORY No Risk No Risk No Risk       Musculoskeletal  Strength & Muscle Tone: within normal limits Gait & Station: normal Patient leans: N/A  Psychiatric Specialty Exam  Presentation  General Appearance:  Casual  Eye Contact: Good  Speech: Clear and Coherent; Normal Rate  Speech Volume: Normal  Handedness: Right   Mood and Affect  Mood: Anxious  Affect: Congruent   Thought Process  Thought Processes: Coherent  Descriptions of Associations:Intact  Orientation:Full (Time, Place and Person)  Thought Content:Logical  Diagnosis of Schizophrenia or Schizoaffective disorder in past: Yes  Duration of Psychotic Symptoms: Greater than six months   Hallucinations:Hallucinations: Auditory Description of Auditory Hallucinations: continues to hear voices of females and males having converstation  Ideas of Reference:Paranoia; Delusions  Suicidal Thoughts:Suicidal Thoughts: No  Homicidal Thoughts:Homicidal Thoughts:  No   Sensorium  Memory: Immediate Fair; Recent Fair; Remote Fair  Judgment: Fair  Insight: Fair   Art therapist  Concentration: Fair  Attention Span: Fair  Recall: [employer] of Knowledge: Fair  Language: Fair   Psychomotor Activity  Psychomotor Activity: Psychomotor Activity: Normal   Assets  Assets: Communication Skills; Desire for Improvement; Physical Health; Resilience; Social Support   Sleep  Sleep: Sleep: Fair Number of Hours of Sleep: 0   Nutritional Assessment (For OBS and FBC admissions only) Has the patient had a weight loss or gain of 10 pounds or more in the last 3 months?: No Has the patient had a decrease in food intake/or  appetite?: No Does the patient have dental problems?: No Does the patient have eating habits or behaviors that may be indicators of an eating disorder including binging or inducing vomiting?: No Has the patient recently lost weight without trying?: 0 Has the patient been eating poorly because of a decreased appetite?: 0 Malnutrition Screening Tool Score: 0    Physical Exam  Physical Exam Vitals and nursing note reviewed.  Constitutional:      General: He is not in acute distress.    Appearance: He is well-developed.  HENT:     Head: Normocephalic.  Eyes:     General:        Right eye: No discharge.        Left eye: No discharge.  Cardiovascular:     Rate and Rhythm: Normal rate.  Pulmonary:     Effort: Pulmonary effort is normal. No respiratory distress.  Musculoskeletal:        General: Normal range of motion.     Cervical back: Normal range of motion.  Skin:    Coloration: Skin is not jaundiced or pale.  Neurological:     Mental Status: He is alert and oriented to person, place, and time.  Psychiatric:        Mood and Affect: Mood normal.    Review of Systems  Constitutional: Negative.   HENT: Negative.    Eyes: Negative.   Respiratory: Negative.    Cardiovascular: Negative.    Musculoskeletal: Negative.   Skin: Negative.   Neurological: Negative.   Psychiatric/Behavioral:  Positive for hallucinations.    Blood pressure 133/67, pulse 85, temperature 98.2 F (36.8 C), resp. rate 19, SpO2 100 %. There is no height or weight on file to calculate BMI.  Disposition: Patient has been accepted to accepted to [facility] for IP admission.   William Hughs, NP 02/14/2023, 5:29 PM

## 2023-02-14 NOTE — Group Note (Signed)
[name] Group Therapy Note   Group Date: 02/14/2023 Start Time: 1100 End Time: 1200   Type of Therapy and Topic:  Group Therapy: Therapy Goals   Participation Level:  Did Not Attend   Description of Group: In this process group, patients discussed using strengths to work toward goals and address challenges.  Patients identified two positive things about themselves and one goal they were working on.  Patients were given the opportunity to share openly and support each other's plan for self-empowerment.  The group discussed the value of gratitude and were encouraged to have a daily reflection of positive characteristics or circumstances.  Patients were encouraged to identify a plan to utilize their strengths to work on current challenges and goals.   Therapeutic Goals Patient will verbalize personal strengths/positive qualities and relate how these can assist with achieving desired personal goals Patients will verbalize affirmation of peers plans for personal change and goal setting Patients will explore the value of gratitude and positive focus as related to successful achievement of goals Patients will verbalize a plan for regular reinforcement of personal positive qualities and circumstances.   Summary of Patient Progress: Did Not Attend    Therapeutic Modalities Cognitive Behavioral Therapy Motivational Interviewing   Isabella Bowens, Theresia Majors 02/14/2023  1:47 PM

## 2023-02-15 ENCOUNTER — Encounter (HOSPITAL_COMMUNITY): Payer: Self-pay

## 2023-02-15 DIAGNOSIS — F200 Paranoid schizophrenia: Secondary | ICD-10-CM

## 2023-02-15 DIAGNOSIS — F2081 Schizophreniform disorder: Principal | ICD-10-CM | POA: Diagnosis present

## 2023-02-15 MED ORDER — RISPERIDONE 1 MG PO TABS
1.0000 mg | ORAL_TABLET | Freq: Two times a day (BID) | ORAL | Status: DC
  Administered 2023-02-15 – 2023-02-16 (×3): 1 mg via ORAL
  Filled 2023-02-15 (×8): qty 1

## 2023-02-15 NOTE — Group Note (Signed)
Date:  02/15/2023 Time:  10:15 PM  Group Topic/Focus:  NA/WRAP Up and Wind Down Group    Participation Level:  Active  Participation Quality:  Attentive  Affect:  Appropriate  Cognitive:  Alert  Insight: Appropriate  Engagement in Group:  Engaged  Modes of Intervention:  Education and Support  Additional Comments:   Pt attended the NA/Wrap Up and Wind Down group.  Edmund Hilda Gaby Harney 02/15/2023, 10:15 PM

## 2023-02-15 NOTE — Group Note (Signed)
Date:  02/16/2023 Time:  12:36 PM  Group Topic/Focus:  Goals Group:   The focus of this group is to help patients establish daily goals to achieve during treatment and discuss how the patient can incorporate goal setting into their daily lives to aide in recovery.    Participation Level:  Active  Participation Quality:  Attentive  Affect:  Appropriate  Cognitive:  Appropriate  Insight: Appropriate  Engagement in Group:  Engaged  Modes of Intervention:  Discussion  Additional Comments:  Patient attended group and was attentive the duration of it. Patient's goal was to attend all groups.   Kandee Escalante T Lorraine Lax 02/16/2023, 12:36 PM

## 2023-02-15 NOTE — H&P (Signed)
[facility] Psychiatric Admission Assessment Adult  Patient Identification: William Romero MRN: [id number] DOB: 28-Oct-1980  Date of Evaluation: 02/15/2023, 8:29 PM Bed: 0407/0407-01  Chief Complaint: psychosis Active Problems:   Schizophreniform psychosis (HCC)   HISTORY OF PRESENT ILLNESS  William Romero is a 42 y.o., male with PMH of MDD, ADHD and schizophrenia per chart, who presented voluntary to [facility] (02/13/2023), then admitted to [facility] Voluntary for AH and paranoia x3-4 months  PTA/Home Rx: adderall PCP: William Romero, William Patricia, MD  Patient was initially seen asleep in the mid AM, awoken easily, no acute distress. During evaluation, patient was mildly guarded, vague, and would become irritable if asked to give details and would not answer.  Patient reported that he was admitted for "AH", that has been present for 3-4 months. Stated that the voices are 3 different females that are agnostic. These voices are women of people he knows from the past. Denied command AH, rather they "antagonize" this thoughts. He declined to give further details despite re-framing the question multiple different ways. Stated that 2 of the voices are often of his paternal and maternal grandma. Mentioned that he is annoyed with these voices because it interferes with him getting a job. Stated that he feels like the AH are "a liability" because he is worried that the voices (his grandmas) will take the information he learns and give them to others. He believes that his grandmas are antagonizing his for unclear reasons. He mentioned a time last year where his grandma kissed him on the forehead, he interpreted this as his grandma is comparing him to others because he doesn't have a partner.   Denied any new stressors, substances that could have triggered these AH. When asked about THC + UDS, patient stated that he knows of marijuana and denied using them. Denied having AH like this  in the past. Reported that his current stressors are housing instability, where he was unhoused ~69yr ago. He declined to give further information to why. He has been living in his car up until about a few weeks ago, because his car broke down and stopped running. The past week, patient had been living on the street.   Patient reported being on multiple psychotropics in the past, and he perseverated that he doesn't feel like he is depressed. Only current med patient takes is adderall, which he has been taking consistently for years. Did note having 1/2 of a dozen inpatient psych admission that was a "set up" and he was "provoked" and admitted under IVC between 2012-2019.   Reported AH, thought withdrawal, and paranoia per above.  He denied VH, thought insertion, mind control, powers, and idea of reference.   During current evaluation, patient stated that he last heard the [facility] was this morning. He declined to tell the content.  Reported that his sleep was good because he is not sleeping on the street. Mood is "good", appetite is appropriate. Denied active and passive SI/HI. Denied VH.   Review of Systems  Respiratory:  Negative for shortness of breath.   Cardiovascular:  Negative for chest pain.  Gastrointestinal:  Negative for nausea and vomiting.  Neurological:  Negative for dizziness and headaches.   PSYCH ROS  Depression Symptoms:  Denied depressed mood and pervasive sadness, anhedonia, difficulties concentration, change in appetite, hopelessness, guilt, psychomotor slowing or restlessness, and suicidal ideation or intentions Reported poor sleep and energy due to housing instability.  Duration of Depression Symptoms: x3-15mo (Hypo) Manic  Symptoms:  Denied excessive energy despite decreased need for sleep (<2hr/night x4-7days), grandiosity/inflated self-esteem, pressured speech Anxiety Symptoms:   Denied having difficulty controlling/managing anxiety/worry/stress and that it is out of  proportion with stressors.  Psychotic Symptoms:  Reported AH, thought withdrawal, and paranoia  Denied VH, thought insertion, mind control, powers, and idea of reference.  Trauma Symptoms:  Trauma: Denied life-threatening, physical, sexual, witnessed Denied flashbacks, nightmares, hypervigilance/hyperarousal, avoidance.  COLLATERAL  Denied consent for collateral   HISTORY  Past Psychiatric History:  Diagnoses: MDD, ADHD and schizophrenia Inpatient treatment: ~6 between 2012-2019 Suicide: Denied Homicide: Denied Medication history: Unknown Medication compliance: Poor, except to adderall  Past Medical/Surgical History:  Medical Diagnoses:NA Prior Hosp: NA Prior Surgeries/Trauma: NA Concussions/Head Trauma/LOC:Denied Seizures: Denied PCP: William Romero, William Patricia, MD  Allergies: Patient has no known allergies.   Family Psychiatric History:  Denied   Social History:  Housing: unhoused Finances: unemployed Children: NA Guns/Weapons: Denied Legal: jail - reported for self defense  Substance Use History: Alcohol: last drink was ~5 d prior to admission, 2-3 beers. Prior to that ~3 weeks prior was 1 pint of whiskey  Nicotine:  reports that he has quit smoking. His smoking use included cigarettes. He smoked an average of .5 packs per day. He has never used smokeless tobacco. Marijuana: Yes IV drug use: Denied Stimulants: Possible stimulant misuse  Opiates: Denied Sedative/hypnotics: Denied Hallucinogens: Denied H/O withdrawals, blackouts: Denied H/O DT: Denied H/O Detox / Rehab: Denied DUI/DWI: Denied  Is the patient at risk to self? Yes  Has the patient been a risk to self in the past 6 months? No.  Has the patient been a risk to self within the distant past? Yes.    Is the patient a risk to others? No.  Has the patient been a risk to others in the past 6 months? No.  Has the patient been a risk to others within the distant past? Yes.     Substance Abuse History  in the last 12 months:  Yes.   Alcohol Screening: 1. How often do you have a drink containing alcohol?: Monthly or less 2. How many drinks containing alcohol do you have on a typical day when you are drinking?: 3 or 4 3. How often do you have six or more drinks on one occasion?: Never AUDIT-C Score: 2 4. How often during the last year have you found that you were not able to stop drinking once you had started?: Never 5. How often during the last year have you failed to do what was normally expected from you because of drinking?: Never 6. How often during the last year have you needed a first drink in the morning to get yourself going after a heavy drinking session?: Never 7. How often during the last year have you had a feeling of guilt of remorse after drinking?: Never 8. How often during the last year have you been unable to remember what happened the night before because you had been drinking?: Never 9. Have you or someone else been injured as a result of your drinking?: No 10. Has a relative or friend or a doctor or another health worker been concerned about your drinking or suggested you cut down?: No Alcohol Use Disorder Identification Test Final Score (AUDIT): 2 Tobacco Screening:    Current Medications: Current Facility-Administered Medications  Medication Dose Route Frequency Provider Last Rate Last Admin   acetaminophen (TYLENOL) tablet 650 mg  650 mg Oral Q6H PRN Ardis Hughs, NP  alum & mag hydroxide-simeth (MAALOX/MYLANTA) 200-200-20 MG/5ML suspension 30 mL  30 mL Oral Q4H PRN Ardis Hughs, NP       diphenhydrAMINE (BENADRYL) capsule 50 mg  50 mg Oral TID PRN Ardis Hughs, NP       Or   diphenhydrAMINE (BENADRYL) injection 50 mg  50 mg Intramuscular TID PRN Ardis Hughs, NP       haloperidol (HALDOL) tablet 5 mg  5 mg Oral TID PRN Ardis Hughs, NP       Or   haloperidol lactate (HALDOL) injection 5 mg  5 mg Intramuscular TID PRN Ardis Hughs, NP       hydrOXYzine (ATARAX) tablet 25 mg  25 mg Oral TID PRN Ardis Hughs, NP   25 mg at 02/15/23 1249   LORazepam (ATIVAN) tablet 2 mg  2 mg Oral TID PRN Ardis Hughs, NP       Or   LORazepam (ATIVAN) injection 2 mg  2 mg Intramuscular TID PRN Ardis Hughs, NP       magnesium hydroxide (MILK OF MAGNESIA) suspension 30 mL  30 mL Oral Daily PRN Ardis Hughs, NP       risperiDONE (RISPERDAL) tablet 1 mg  1 mg Oral Q12H Princess Bruins, DO   1 mg at 02/15/23 1249   traZODone (DESYREL) tablet 50 mg  50 mg Oral QHS PRN Ardis Hughs, NP       PTA Medications: Medications Prior to Admission  Medication Sig Dispense Refill Last Dose   amphetamine-dextroamphetamine (ADDERALL) 20 MG tablet Take one tab in the morning and half tab in the evening (Patient taking differently: Take 10-20 mg by mouth 2 (two) times daily. Take one tablet in the morning and half a tablet after lunch.) 45 tablet 0    paliperidone (INVEGA) 6 MG 24 hr tablet Take 1 tablet (6 mg total) by mouth daily.       OBJECTIVE  BP (!) 137/92 (BP Location: Right Arm)   Pulse 82   Temp 97.8 F (36.6 C) (Oral)   Resp 20   Ht 6' (1.829 m)   Wt 99.3 kg   SpO2 97%   BMI 29.70 kg/m  Physical Findings: Physical Exam Vitals and nursing note reviewed.  Constitutional:      General: He is not in acute distress.    Appearance: He is not ill-appearing, toxic-appearing or diaphoretic.  HENT:     Head: Normocephalic.  Pulmonary:     Effort: Pulmonary effort is normal. No respiratory distress.  Neurological:     Mental Status: He is alert.     Musculoskeletal: Strength & Muscle Tone: within normal limits  Gait & Station: limping Patient leans: N/A   Presentation  General Appearance:Disheveled Eye Contact:Good Speech:Clear and Coherent, Normal Rate Volume:Normal Handedness:Right  Mood and Affect  Mood:Euthymic Affect:Appropriate, Congruent, Constricted  Thought Process  Thought  Process:Coherent, Goal Directed Descriptions of Associations:Circumstantial  Thought Content Suicidal Thoughts:Suicidal Thoughts: No Homicidal Thoughts:Homicidal Thoughts: No Hallucinations:Hallucinations: Auditory Ideas of Reference:Percusatory, Paranoia (thought broadcasting) Thought Content:Rumination, Paranoid Ideation, Perseveration, Illogical, Delusions  Sensorium  Memory:Immediate Fair Judgment:Impaired Insight:Shallow  Executive Functions  Orientation:Full (Time, Place and Person) Language:Good Concentration:Good Attention:Good Recall:Good Fund of Knowledge:Good  Psychomotor Activity  Psychomotor Activity:Psychomotor Activity: Normal  Sleep  Quality:Good  Assets  Assets:Communication Skills, Desire for Improvement, Resilience  Lab Results:  No results found for this or any previous visit (from the past 48 hour(s)).  Blood Alcohol level:  Lab Results  Component Value Date   ETH <10 02/13/2023   ETH <10 12/02/2017   Metabolic Disorder Labs:  Lab Results  Component Value Date   HGBA1C 5.0 02/13/2023   MPG 96.8 02/13/2023   No results found for: "PROLACTIN" Lab Results  Component Value Date   CHOL 150 02/13/2023   TRIG 100 02/13/2023   HDL 53 02/13/2023   CHOLHDL 2.8 02/13/2023   VLDL 20 02/13/2023   LDLCALC 77 02/13/2023   ASSESSMENT / PLAN   Active Problems:   Schizophreniform psychosis (HCC)    Safety and Monitoring: Voluntary admission to inpatient psychiatric unit for safety, stabilization and treatment   2. Psychiatric Diagnoses and Treatment:   Schizophreniform Presented with psychosis x3-76mo, unclear etiology. Broad ddx, including substance induced, acute stress given his years of psychiatric stability (chart shows regular visit to PCP for ADHD and only few recent ED visits over the past year). He was started on invega PO by prior provider however will switch to more potent rx, risperdal DISCONTINUED invega 6 mg PO STARTED risperdal 1  mg BID  Tobacco use d/o  NRTs Encouraged cessation   Cannabis use d/o UDS positive. Encouraged cessation   Stimulant misuse d/o Reported adherent to adderall for years and reported still taking it, however UDS negative for amphetamines. Possible diversion.   3. Medical Issues Being Addressed:  NA  4. Routine and other pertinent labs:  BMI:29.7  Per above. 5. Disposition Planning:  Tentative Date: TBA  Barrier: psychosis Location: TBA   Treatment Plan Summary: I certify that inpatient services furnished can reasonably be expected to improve the patient's condition.   Daily contact with patient to assess and evaluate symptoms and progress in treatment and Medication management Daily contact with patient to assess and evaluate symptoms and progress in treatment Patient's case to be discussed in multi-disciplinary team meeting Observation Level: q15 minute checks  Vital signs: q12 hours Precautions: suicide, elopement, and assault The risks/benefits/side-effects/alternatives to this medication were discussed in detail with the patient and time was given for questions. The patient consents to medication trial. The patient consents to medication trial. FDA black box warnings, if present, were discussed. Metabolic profile and EKG monitoring obtained while on an atypical antipsychotic  Encouraged patient to participate in unit milieu and in scheduled group therapies  Short Term Goals: Ability to identify changes in lifestyle to reduce recurrence of condition will improve, Ability to verbalize feelings will improve, Ability to disclose and discuss suicidal ideas, Ability to demonstrate self-control will improve, Ability to identify and develop effective coping behaviors will improve, Ability to maintain clinical measurements within normal limits will improve, Compliance with prescribed medications will improve, and Ability to identify triggers associated with substance abuse/mental health  issues will improve Long Term Goals: Improvement in symptoms so as ready for discharge Social work and case management to assist with discharge planning and identification of hospital follow-up needs prior to discharge Estimated LOS: 5-7 days Discharge Concerns: Need to establish a safety plan; Medication compliance and effectiveness Discharge Goals: Return home with outpatient referrals for mental health follow-up including medication management/psychotherapy  Total Time Spent in Direct Patient Care:  Patient's case was discussed with Attending, see attestation for more information  Signed: Princess Bruins, DO Psychiatry Resident, PGY-2 [facility] - Adult  [address] [place], [place] [address] Ph: [phone] Fax: [phone]

## 2023-02-15 NOTE — Plan of Care (Signed)
  Problem: Safety: Goal: Periods of time without injury will increase Outcome: Progressing   

## 2023-02-15 NOTE — Group Note (Signed)
Recreation Therapy Group Note   Group Topic:Leisure Education  Group Date: 02/15/2023 Start Time: 0930 End Time: 1000 Facilitators: Toyia Jelinek-McCall, LRT,CTRS Location: [address] Dayroom   Goal Area(s) Addresses:  Patient will successfully identify positive leisure and recreation activities.  Patient will acknowledge benefits of participation in healthy leisure activities post discharge.  Patient will actively work with peers toward a shared goal.    Group Description: Pictionary. In groups of 5-7, patients took turns trying to guess the picture being drawn on the board by their teammate.  If the team guessed the correct answer, they won a point.  If the team guessed wrong, the other team got a chance to steal the point. After several rounds of game play, the team with the most points were declared winners. Post-activity discussion reviewed benefits of positive recreation outlets: reducing stress, improving coping mechanisms, increasing self-esteem, and building larger support systems.    Clinical Observations/Individualized Feedback: Group unable to be facilitated due orientation group running into recreation therapy group time.   Plan: Continue to engage patient in RT group sessions 2-3x/week.   William Romero, LRT,CTRS 02/15/2023 2:18 PM

## 2023-02-15 NOTE — Progress Notes (Signed)
   02/15/23 2000  Psych Admission Type (Psych Patients Only)  Admission Status Voluntary  Psychosocial Assessment  Patient Complaints Anxiety  Eye Contact Fair  Facial Expression Flat  Affect Appropriate to circumstance  Speech Logical/coherent  Interaction Cautious  Motor Activity Slow  Appearance/Hygiene Unremarkable  Behavior Characteristics Cooperative;Appropriate to situation  Mood Preoccupied;Sad  Thought Process  Coherency WDL  Content Paranoia  Delusions Paranoid  Perception Hallucinations  Hallucination Auditory  Judgment Impaired  Confusion None  Danger to Self  Current suicidal ideation? Denies  Agreement Not to Harm Self Yes  Description of Agreement verbal  Danger to Others  Danger to Others None reported or observed   Alert/oriented. Makes needs/concerns known to staff. Pleasant cooperative with staff. Denies SI/HI  but states has  audio  hallucinations. Med compliant.  Patient states went. Will encourage  compliance and progression towards goals. Verbally contracted for safety. Will continue to monitor.

## 2023-02-15 NOTE — Progress Notes (Signed)
D: Patient is alert, oriented, pleasant, and cooperative. Patient endorsing AH. Denies SI, HI, VH, and verbally contracts for safety. Patient reports he slept good last night. Patient reports his appetite as good, energy level as low, and concentration as good. Patient rates his depression 0/10, hopelessness 0/10, and anxiety 5/10. Patient denies any physical symptoms/pain.    A: Scheduled medications administered per MD order. PRN vistaril administered. Support provided. Patient educated on safety on the unit and medications. Routine safety checks every 15 minutes. Patient stated understanding to tell nurse about any new physical symptoms. Patient understands to tell staff of any needs.     R: No adverse drug reactions noted. Patient verbally contracts for safety. Patient remains safe at this time and will continue to monitor.    02/15/23 1100  Psych Admission Type (Psych Patients Only)  Admission Status Voluntary  Psychosocial Assessment  Patient Complaints Anxiety  Eye Contact Fair  Facial Expression Flat  Affect Appropriate to circumstance  Speech Logical/coherent  Interaction Cautious  Motor Activity Slow  Appearance/Hygiene Unremarkable  Behavior Characteristics Cooperative;Appropriate to situation  Mood Preoccupied  Thought Process  Coherency WDL  Content Paranoia  Delusions Paranoid  Perception Hallucinations  Hallucination Auditory  Judgment Impaired  Confusion None  Danger to Self  Current suicidal ideation? Denies  Agreement Not to Harm Self Yes  Description of Agreement verbal  Danger to Others  Danger to Others None reported or observed

## 2023-02-15 NOTE — BHH Suicide Risk Assessment (Signed)
[facility] Admission Suicide Risk Assessment  Nursing information obtained from: Patient Demographic factors: Male, Caucasian, Low socioeconomic status Current Mental Status: NA Loss Factors: Financial problems / change in socioeconomic status Historical Factors: NA Risk Reduction Factors: NA  Principal Problem: Schizophrenia (HCC) Diagnosis: Principal Problem:   Schizophrenia (HCC)   Subjective Data:  William Romero is a 42 y.o. male with PMH of MDD, ADHD and schizophrenia per chart, who presented voluntary to [facility] (02/13/2023), then admitted to [facility] Voluntary for AH and paranoia x3-4 months  Home Rx: Adderall 20 mg qAM and 10 mg in the afternoon  Continued Clinical Symptoms:  Alcohol Use Disorder Identification Test Final Score (AUDIT): 2 The "Alcohol Use Disorders Identification Test", Guidelines for Use in Primary Care, Second Edition.  World Science writer [facility]). Score between 0-7:  no or low risk or alcohol related problems. Score between 8-15:  moderate risk of alcohol related problems. Score between 16-19:  high risk of alcohol related problems. Score 20 or above:  warrants further diagnostic evaluation for alcohol dependence and treatment.  CLINICAL FACTORS:  Alcohol/Substance Abuse/Dependencies Currently Psychotic Unstable or Poor Therapeutic Relationship Previous Psychiatric Diagnoses and Treatments  Musculoskeletal: Strength & Muscle Tone: within normal limits Gait & Station: limps Patient leans: N/A   Presentation  General Appearance:Disheveled Eye Contact:Good Speech:Clear and Coherent, Normal Rate Volume:Normal Handedness:Right  Mood and Affect  Mood:Euthymic Affect:Appropriate, Congruent, Constricted  Thought Process  Thought Process:Coherent, Goal Directed Descriptions of Associations:Circumstantial  Thought Content Suicidal Thoughts:Suicidal Thoughts: No Homicidal Thoughts:Homicidal Thoughts: No Hallucinations:Hallucinations:  Auditory Ideas of Reference:Percusatory, Paranoia (thought broadcasting) Thought Content:Rumination, Paranoid Ideation, Perseveration, Illogical, Delusions  Sensorium  Memory:Immediate Fair Judgment:Impaired Insight:Shallow  Art therapist  Orientation:Full (Time, Place and Person) Language:Good Concentration:Good Attention:Good Recall:Good Fund of Knowledge:Good  Psychomotor Activity  Psychomotor Activity:Psychomotor Activity: Normal  Assets  Assets:Communication Skills, Desire for Improvement, Resilience  Sleep  Quality:Good   COGNITIVE FEATURES THAT CONTRIBUTE TO RISK:  Closed-mindedness, Loss of executive function, Polarized thinking, and Thought constriction (tunnel vision)    SUICIDE RISK:  Moderate:  Frequent suicidal ideation with limited intensity, and duration, some specificity in terms of plans, no associated intent, good self-control, limited dysphoria/symptomatology, some risk factors present, and identifiable protective factors, including available and accessible social support.   PLAN OF CARE:  Patient met requirement for inpatient psychiatric hospitalization and has been admitted.  See H&P & attending's attestation for detailed plan.  I certify that inpatient services furnished can reasonably be expected to improve the patient's condition.  Total Time spent with patient:  See attending attestation  Signed: Princess Bruins, DO Psychiatry Resident, PGY-2 [facility] - Adult  [address] [place], [place] [address] Ph: [phone] Fax: [phone] 02/15/2023, 1:54 PM

## 2023-02-15 NOTE — BH IP Treatment Plan (Signed)
Interdisciplinary Treatment and Diagnostic Plan Update  02/15/2023 Time of Session: 11:20 AM  William Romero MRN: [id number]  Principal Diagnosis: Schizophrenia (HCC)  Secondary Diagnoses: Principal Problem:   Schizophrenia (HCC)   Current Medications:  Current Facility-Administered Medications  Medication Dose Route Frequency Provider Last Rate Last Admin   acetaminophen (TYLENOL) tablet 650 mg  650 mg Oral Q6H PRN Ardis Hughs, NP       alum & mag hydroxide-simeth (MAALOX/MYLANTA) 200-200-20 MG/5ML suspension 30 mL  30 mL Oral Q4H PRN Ardis Hughs, NP       diphenhydrAMINE (BENADRYL) capsule 50 mg  50 mg Oral TID PRN Ardis Hughs, NP       Or   diphenhydrAMINE (BENADRYL) injection 50 mg  50 mg Intramuscular TID PRN Ardis Hughs, NP       haloperidol (HALDOL) tablet 5 mg  5 mg Oral TID PRN Ardis Hughs, NP       Or   haloperidol lactate (HALDOL) injection 5 mg  5 mg Intramuscular TID PRN Ardis Hughs, NP       hydrOXYzine (ATARAX) tablet 25 mg  25 mg Oral TID PRN Ardis Hughs, NP   25 mg at 02/15/23 1249   LORazepam (ATIVAN) tablet 2 mg  2 mg Oral TID PRN Ardis Hughs, NP       Or   LORazepam (ATIVAN) injection 2 mg  2 mg Intramuscular TID PRN Ardis Hughs, NP       magnesium hydroxide (MILK OF MAGNESIA) suspension 30 mL  30 mL Oral Daily PRN Ardis Hughs, NP       risperiDONE (RISPERDAL) tablet 1 mg  1 mg Oral Q12H Princess Bruins, DO   1 mg at 02/15/23 1249   traZODone (DESYREL) tablet 50 mg  50 mg Oral QHS PRN Ardis Hughs, NP       PTA Medications: Medications Prior to Admission  Medication Sig Dispense Refill Last Dose   amphetamine-dextroamphetamine (ADDERALL) 20 MG tablet Take one tab in the morning and half tab in the evening (Patient taking differently: Take 10-20 mg by mouth 2 (two) times daily. Take one tablet in the morning and half a tablet after lunch.) 45 tablet 0    paliperidone (INVEGA) 6 MG 24 hr  tablet Take 1 tablet (6 mg total) by mouth daily.       Patient Stressors: Financial difficulties    Patient Strengths: Careers information officer for treatment/growth   Treatment Modalities: Medication Management, Group therapy, Case management,  1 to 1 session with clinician, Psychoeducation, Recreational therapy.   Physician Treatment Plan for Primary Diagnosis: Schizophrenia (HCC) Long Term Goal(s):     Short Term Goals:    Medication Management: Evaluate patient's response, side effects, and tolerance of medication regimen.  Therapeutic Interventions: 1 to 1 sessions, Unit Group sessions and Medication administration.  Evaluation of Outcomes: Not Progressing  Physician Treatment Plan for Secondary Diagnosis: Principal Problem:   Schizophrenia (HCC)  Long Term Goal(s):     Short Term Goals:       Medication Management: Evaluate patient's response, side effects, and tolerance of medication regimen.  Therapeutic Interventions: 1 to 1 sessions, Unit Group sessions and Medication administration.  Evaluation of Outcomes: Not Progressing   RN Treatment Plan for Primary Diagnosis: Schizophrenia (HCC) Long Term Goal(s): Knowledge of disease and therapeutic regimen to maintain health will improve  Short Term Goals: Ability to remain free from  injury will improve, Ability to verbalize frustration and anger appropriately will improve, Ability to demonstrate self-control, Ability to participate in decision making will improve, Ability to verbalize feelings will improve, Ability to disclose and discuss suicidal ideas, Ability to identify and develop effective coping behaviors will improve, and Compliance with prescribed medications will improve  Medication Management: RN will administer medications as ordered by provider, will assess and evaluate patient's response and provide education to patient for prescribed medication. RN will report any adverse  and/or side effects to prescribing provider.  Therapeutic Interventions: 1 on 1 counseling sessions, Psychoeducation, Medication administration, Evaluate responses to treatment, Monitor vital signs and CBGs as ordered, Perform/monitor CIWA, COWS, AIMS and Fall Risk screenings as ordered, Perform wound care treatments as ordered.  Evaluation of Outcomes: Not Progressing   [name] Treatment Plan for Primary Diagnosis: Schizophrenia (HCC) Long Term Goal(s): Safe transition to appropriate next level of care at discharge, Engage patient in therapeutic group addressing interpersonal concerns.  Short Term Goals: Engage patient in aftercare planning with referrals and resources, Increase social support, Increase ability to appropriately verbalize feelings, Increase emotional regulation, Facilitate acceptance of mental health diagnosis and concerns, Facilitate patient progression through stages of change regarding substance use diagnoses and concerns, Identify triggers associated with mental health/substance abuse issues, and Increase skills for wellness and recovery  Therapeutic Interventions: Assess for all discharge needs, 1 to 1 time with Social worker, Explore available resources and support systems, Assess for adequacy in community support network, Educate family and significant other(s) on suicide prevention, Complete Psychosocial Assessment, Interpersonal group therapy.  Evaluation of Outcomes: Not Progressing   Progress in Treatment: Attending groups: No. Participating in groups: No. Taking medication as prescribed: Yes. Toleration medication: Yes. Family/Significant other contact made: No, will contact:  Whoever patient give CSW permission to talk to  Patient understands diagnosis: Yes. Discussing patient identified problems/goals with staff: Yes. Medical problems stabilized or resolved: Yes. Denies suicidal/homicidal ideation: Yes. Issues/concerns per patient self-inventory: No.   New  problem(s) identified: No, Describe:  None reported   New Short Term/Long Term Goal(s):medication stabilization, elimination of SI thoughts, development of comprehensive mental wellness plan.    Patient Goals:  " Not have the symptoms I was having "   Discharge Plan or Barriers: Patient recently admitted. CSW will continue to follow and assess for appropriate referrals and possible discharge planning.    Reason for Continuation of Hospitalization: Anxiety Depression Hallucinations Medication stabilization  Estimated Length of Stay: 3-5 days  Last 3 [place] Suicide Severity Risk Score: Flowsheet Row Ad[facility] (Current) [facility] from [facility]/13/2023 in [facility] ED from 02/08/2023 in [facility]  C-SSRS RISK CATEGORY No Risk No Risk No Risk       Last [facility] 2/9 Scores:    09/26/2022    3:43 PM 06/21/2022    8:20 AM 01/03/2022   11:30 AM  Depression screen PHQ 2/9  Decreased Interest 0 0 0  Down, Depressed, Hopeless 0 0 0  PHQ - 2 Score 0 0 0  Altered sleeping 0 0 0  Tired, decreased energy 0 0 0  Change in appetite 0 0 0  Feeling bad or failure about yourself  0 0 0  Trouble concentrating 0 0 0  Moving slowly or fidgety/restless 0 0 0  Suicidal thoughts 0 0 0  PHQ-9 Score 0 0 0  Difficult doing work/chores Not difficult at all Not difficult  at all Not difficult at all    Scribe for Treatment Team: Beather Arbour 02/15/2023 2:15 PM

## 2023-02-16 DIAGNOSIS — F17200 Nicotine dependence, unspecified, uncomplicated: Secondary | ICD-10-CM | POA: Diagnosis present

## 2023-02-16 DIAGNOSIS — F200 Paranoid schizophrenia: Secondary | ICD-10-CM | POA: Diagnosis not present

## 2023-02-16 DIAGNOSIS — F1290 Cannabis use, unspecified, uncomplicated: Secondary | ICD-10-CM | POA: Diagnosis present

## 2023-02-16 MED ORDER — RISPERIDONE 2 MG PO TABS
2.0000 mg | ORAL_TABLET | Freq: Every day | ORAL | Status: DC
  Filled 2023-02-16 (×2): qty 1

## 2023-02-16 MED ORDER — RISPERIDONE 1 MG PO TABS
1.0000 mg | ORAL_TABLET | ORAL | Status: DC
  Administered 2023-02-17: 1 mg via ORAL
  Filled 2023-02-16 (×4): qty 1

## 2023-02-16 MED ORDER — BENZTROPINE MESYLATE 0.5 MG PO TABS
0.5000 mg | ORAL_TABLET | Freq: Two times a day (BID) | ORAL | Status: DC
  Administered 2023-02-16 – 2023-02-22 (×12): 0.5 mg via ORAL
  Filled 2023-02-16: qty 14
  Filled 2023-02-16 (×6): qty 1
  Filled 2023-02-16: qty 14
  Filled 2023-02-16 (×9): qty 1

## 2023-02-16 NOTE — BHH Group Notes (Signed)
Adult Psychoeducational Group Note  Date:  02/16/2023 Time:  9:53 PM  Group Topic/Focus:  Wrap-Up Group:   The focus of this group is to help patients review their daily goal of treatment and discuss progress on daily workbooks.  Participation Level:  Active  Participation Quality:  Attentive  Affect:  Appropriate  Cognitive:  Alert  Insight: Appropriate  Engagement in Group:  Engaged  Modes of Intervention:  Discussion  Additional Comments:  Patient attended and participated in the Wrap-up group.  Jearl Klinefelter 02/16/2023, 9:53 PM

## 2023-02-16 NOTE — Progress Notes (Signed)
[facility] MD Progress Note  Date: 02/16/2023 9:37 AM Name: William Romero  DOB: [birth year] MRN: [id number] Unit: [id number]  CC: psychosis  REASON FOR ADMISSION   William Romero is a 42 y.o. male with PMH of MDD, ADHD and schizophrenia per chart, who presented voluntary to [facility] (02/13/2023), then admitted to [facility] Voluntary for AH and paranoia x3-4 months   Principal Problem: Schizophreniform psychosis (HCC) Diagnosis: Principal Problem:   Schizophreniform psychosis (HCC) Active Problems:   Tobacco use disorder   Cannabis use disorder  CHART REVIEW FROM LAST 24 HOURS   Adherent to scheduled meds: yes  Agitation PRNs: no Per nursing staff: no acute behavioral concerns Groups: yes, engaged Documented sleep last 24 hours: 8.25   Yesterday, the psychiatry team made the following recommendations:  DISCONTINUED invega 6 mg PO STARTED risperdal 1 mg BID Held home adderall 20 mg qAM + 10 mg qAfternoon  SUBJECTIVE  Patient was initially seen asleep in bed, awoken easily, no acute distress. Patient was vague and calm during evaluation.  Reported that his mood currently anxious, a bit annoyed.  He woke up this AM to the [facility] of the women in his head. Declined to give further information about them. There is still concern that the voices will still give information to another person, that patient does not know nor for what reason. Continues to endorse persecutory delusions from grandma.  Denied paranoia, feels safe here at [facility]. Declined command AH. Denied idea of reference.   Sleep and appetite remains intact and appropriate. No issues staying or falling asleep.  He denied side effects to risperdal yesterday, although endorsed some anxiety not long after receiving risperdal.   Requested that risperdal be increased.   Review of Systems  Respiratory:  Negative for shortness of breath.   Cardiovascular:  Negative for chest pain.  Gastrointestinal:  Negative for nausea and  vomiting.  Neurological:  Negative for dizziness and headaches.   HISTORY  Past Psychiatric History:  Diagnoses: MDD, ADHD and schizophrenia Inpatient treatment: ~6 between 2012-2019 Suicide: Denied Homicide: Denied Medication history: Unknown Medication compliance: Poor, except to adderall   Past Medical/Surgical History:  Medical Diagnoses:NA Prior Hosp: NA Prior Surgeries/Trauma: NA Concussions/Head Trauma/LOC:Denied Seizures: Denied PCP: Philip Aspen, Limmie Patricia, MD  Allergies: Patient has no known allergies.    Family Psychiatric History:  Denied    Social History:  Housing: unhoused Finances: unemployed Children: NA Guns/Weapons: Denied Legal: jail - reported for self defense   Substance Use History: Alcohol: last drink was ~5 d prior to admission, 2-3 beers. Prior to that ~3 weeks prior was 1 pint of whiskey  Nicotine:  reports that he has quit smoking. His smoking use included cigarettes. He smoked an average of .5 packs per day. He has never used smokeless tobacco. Marijuana: Yes IV drug use: Denied Stimulants: Possible stimulant misuse  Opiates: Denied Sedative/hypnotics: Denied Hallucinogens: Denied H/O withdrawals, blackouts: Denied H/O DT: Denied H/O Detox / Rehab: Denied DUI/DWI: Denied  Past Medical History:  Past Medical History:  Diagnosis Date   Schizophrenia (HCC)     Past Surgical History:  Procedure Laterality Date   APPENDECTOMY     Family History:  History reviewed. No pertinent family history. Social History:  Social History   Substance and Sexual Activity  Alcohol Use Not Currently   Alcohol/week: 5.0 standard drinks of alcohol   Types: 5 Cans of beer per week     Social History   Substance and Sexual Activity  Drug Use No    Social History   Socioeconomic History   Marital status: Single    Spouse name: Not on file   Number of children: Not on file   Years of education: Not on file   Highest education level: Not  on file  Occupational History   Not on file  Tobacco Use   Smoking status: Former    Packs/day: .5    Types: Cigarettes   Smokeless tobacco: Never  Vaping Use   Vaping Use: Never used  Substance and Sexual Activity   Alcohol use: Not Currently    Alcohol/week: 5.0 standard drinks of alcohol    Types: 5 Cans of beer per week   Drug use: No   Sexual activity: Not on file  Other Topics Concern   Not on file  Social History Narrative   Not on file   Social Determinants of Health   Financial Resource Strain: Not on file  Food Insecurity: No Food Insecurity (02/14/2023)   Hunger Vital Sign    Worried About Running Out of Food in the Last Year: Never true    Ran Out of Food in the Last Year: Never true  Transportation Needs: No Transportation Needs (02/14/2023)   PRAPARE - Administrator, Civil Service (Medical): No    Lack of Transportation (Non-Medical): No  Physical Activity: Not on file  Stress: Not on file  Social Connections: Not on file   Additional Social History:                         Current Medications: Current Facility-Administered Medications  Medication Dose Route Frequency Provider Last Rate Last Admin   acetaminophen (TYLENOL) tablet 650 mg  650 mg Oral Q6H PRN Ardis Hughs, NP       alum & mag hydroxide-simeth (MAALOX/MYLANTA) 200-200-20 MG/5ML suspension 30 mL  30 mL Oral Q4H PRN Ardis Hughs, NP       diphenhydrAMINE (BENADRYL) capsule 50 mg  50 mg Oral TID PRN Ardis Hughs, NP       Or   diphenhydrAMINE (BENADRYL) injection 50 mg  50 mg Intramuscular TID PRN Ardis Hughs, NP       haloperidol (HALDOL) tablet 5 mg  5 mg Oral TID PRN Ardis Hughs, NP       Or   haloperidol lactate (HALDOL) injection 5 mg  5 mg Intramuscular TID PRN Ardis Hughs, NP       hydrOXYzine (ATARAX) tablet 25 mg  25 mg Oral TID PRN Ardis Hughs, NP   25 mg at 02/16/23 0850   LORazepam (ATIVAN) tablet 2 mg  2 mg Oral  TID PRN Ardis Hughs, NP       Or   LORazepam (ATIVAN) injection 2 mg  2 mg Intramuscular TID PRN Ardis Hughs, NP       magnesium hydroxide (MILK OF MAGNESIA) suspension 30 mL  30 mL Oral Daily PRN Ardis Hughs, NP       risperiDONE (RISPERDAL) tablet 1 mg  1 mg Oral Q12H Princess Bruins, DO   1 mg at 02/16/23 0850   traZODone (DESYREL) tablet 50 mg  50 mg Oral QHS PRN Ardis Hughs, NP   50 mg at 02/15/23 2110    OBJECTIVE  BP (!) 122/95 (BP Location: Right Arm)   Pulse (!) 119   Temp 98.4 F (36.9 C) (Oral)   Resp 16  Ht 6' (1.829 m)   Wt 99.3 kg   SpO2 96%   BMI 29.70 kg/m   Physical Exam Physical Exam Vitals and nursing note reviewed.  Constitutional:      General: He is not in acute distress.    Appearance: He is not ill-appearing, toxic-appearing or diaphoretic.  HENT:     Head: Normocephalic.  Pulmonary:     Effort: Pulmonary effort is normal. No respiratory distress.  Neurological:     Mental Status: He is alert.     AIMS:   ,  ,  ,  ,      Psychiatric Specialty Exam: Presentation General Appearance: Disheveled  Eye Contact: Good  Speech: Clear and Coherent; Normal Rate  Volume: Normal  Handedness:Right   Mood and Affect  Mood: Anxious  Affect: Appropriate; Congruent; Restricted   Thought Process  Thought Process: Coherent; Goal Directed; Linear  Descriptions of Associations: Intact   Thought Content Suicidal Thoughts:No  Homicidal Thoughts:No  Hallucinations:None (continues to endorse AH, no VH. AH of women that he won't describe) continues to hear voices of females and males having converstation  Ideas of Reference:None  Thought Content:Rumination   Sensorium  Memory:Immediate Good  Judgment:Impaired  Insight:Shallow   Art therapist  Orientation:Full (Time, Place and Person)  Language:Good  Concentration:Good  Attention:Good  Recall:Good  Fund of Knowledge:Good   Psychomotor  Activity  Psychomotor Activity:Normal  Assets  Assets:Communication Skills; Desire for Improvement; Resilience  Sleep  Quality:Good  Documented sleep last 24 hours: 8.25   Lab Results:  No results found for this or any previous visit (from the past 48 hour(s)).  Blood Alcohol level:  Lab Results  Component Value Date   ETH <10 02/13/2023   ETH <10 12/02/2017    Metabolic Disorder Labs: Lab Results  Component Value Date   HGBA1C 5.0 02/13/2023   MPG 96.8 02/13/2023   No results found for: "PROLACTIN" Lab Results  Component Value Date   CHOL 150 02/13/2023   TRIG 100 02/13/2023   HDL 53 02/13/2023   CHOLHDL 2.8 02/13/2023   VLDL 20 02/13/2023   LDLCALC 77 02/13/2023   ASSESSMENT / PLAN  Diagnoses / Active Problems: Principal Problem:   Schizophreniform psychosis (HCC) Active Problems:   Tobacco use disorder   Cannabis use disorder  Safety and Monitoring: Voluntary admission to inpatient psychiatric unit for safety, stabilization and treatment    2. Psychiatric Diagnoses and Treatment:  Schizophreniform Presented with psychosis x3-56mo, unclear etiology. Broad ddx, including substance induced, acute stress given his years of psychiatric stability (chart shows regular visit to PCP for ADHD and only few recent ED visits over the past year). He was started on invega PO by prior provider however will switch to more potent rx, risperdal Still endorsing vague AH that he won't describe, will incr antipsychotic per below and add cogentin for possible EPS give quick titration. DISCONTINUED invega 6 mg PO on admission INCREASED risperdal 1 mg BID to 1 mg qAM + 2 mg qPM STARTED cogentin 0.5 mg BID   Tobacco use d/o  NRTs Encouraged cessation    Cannabis use d/o UDS positive. Encouraged cessation               Stimulant misuse d/o  Reported adherent to adderall for years and reported still taking it, however UDS negative for amphetamines. Possible diversion, maybe he  didn't take it 24h prior to UDS. Held home adderall 20mg  + 10mg  daily   3. Medical Issues Being Addressed:  NA  4. Routine and other pertinent labs:   BMI:29.7  CBC Hb 12.2, CMP 139, lipid wnl, a1c wnl, tsh wnl UDS+ marijuana Qtc 448 5. Disposition Planning:  Tentative Date: TBA  Barrier: psychosis Location: TBA   Treatment Plan Summary: I certify that inpatient services furnished can reasonably be expected to improve the patient's condition.   Daily contact with patient to assess and evaluate symptoms and progress in treatment and Medication management Daily contact with patient to assess and evaluate symptoms and progress in treatment Patient's case to be discussed in multi-disciplinary team meeting Observation Level: q15 minute checks  Vital signs: q12 hours Precautions: suicide, elopement, and assault The risks/benefits/side-effects/alternatives to this medication were discussed in detail with the patient and time was given for questions. The patient consents to medication trial. The patient consents to medication trial. FDA black box warnings, if present, were discussed. Metabolic profile and EKG monitoring obtained while on an atypical antipsychotic  Encouraged patient to participate in unit milieu and in scheduled group therapies   Total Time spent with patient:  Patient's case was discussed with Attending, see attestation for more information.   Signed: Princess Bruins, DO Psychiatry Resident, PGY-2 [facility]  [address] [place], [place] [address] Ph: [phone] Fax: [phone]

## 2023-02-16 NOTE — Progress Notes (Signed)
Patient states he slept well and reports adequate appetite but low energy level. Patient appears with flat affect, minimal socialization, but remains cooperative on unit and med compliant. Patient rates his anxiety and depression a 0 out of 10 but did receive hydroxyzine po prn. Patient denies SI,HI, and A/V/H with no plan or intent.

## 2023-02-16 NOTE — Hospital Course (Addendum)
Hospital Course:   During the patient's hospitalization, patient had extensive initial psychiatric evaluation, and follow-up psychiatric evaluations every day.  Psychiatric diagnoses provided upon initial assessment:  Schizophreniform psychosis v substance induced psychosis Tobacco use d/o   Cannabis use d/o       Stimulant misuse d/o   Patient's psychiatric medications were adjusted on admission:  DISCONTINUED previous invega 6 mg PO STARTED risperdal 1 mg BID increased to 1 mg qAM + 2 mg qPM  HOLD home adderall 20 mg qAM + 10 mg in the afternoon   During the hospitalization, other adjustments were made to the patient's psychiatric medication regimen:  Continued cogentin 0.5 mg q12h  Continued haldol 5 mg TID Continued propranolol 10 mg q8h INCREASED gabapentin 100 mg to 300 mg TID  DISCONTINUED invega 6 mg PO on admission - opted for higher potency DISCONTINUED risperdal 1 mg BID to 1 mg qAM + 2 mg qPM - per patient's request   During the hospitalization lab/imaging obtained: BMI:29.7  CBC Hb 12.2, CMP 139, lipid wnl, a1c wnl, tsh wnl UDS+ marijuana Qtc 448  Patient's care was discussed during the interdisciplinary team meeting every day during the hospitalization.  Patient's side effects to prescribed psychiatric medications: NA  Assessment  Gradually, patient started adjusting to milieu. The patient was evaluated each day by a clinical provider to ascertain response to treatment. Improvement was noted by the patient's report of decreasing symptoms, improved sleep and appetite, affect, medication tolerance, behavior, and participation in unit programming.  Patient was asked each day to complete a self inventory noting mood, mental status, pain, new symptoms, anxiety and concerns.    Symptoms were reported as significantly decreased or resolved completely by discharge.   On day of discharge, the patient reports that their mood is stable. The patient denied having suicidal  thoughts for more than 48 hours prior to discharge.  Patient denies having homicidal thoughts.  Patient denies having auditory hallucinations.  Patient denies any visual hallucinations or other symptoms of psychosis. The patient was motivated to continue taking medication with a goal of continued improvement in mental health.   The patient reports their target psychiatric symptoms of auditory hallucinations responded well to the psychiatric medications, and the patient reports overall benefit other psychiatric hospitalization. Supportive psychotherapy was provided to the patient. The patient also participated in regular group therapy while hospitalized. Coping skills, problem solving as well as relaxation therapies were also part of the unit programming.  Labs were reviewed with the patient, and abnormal results were discussed with the patient.  The patient is able to verbalize their individual safety plan to this provider.  Behavioral Events: NA  Restraints: NA  Groups: Engaged, appropriate

## 2023-02-16 NOTE — B[facility] Counselor (Signed)
Adult Comprehensive Assessment  Patient ID: William Romero, male   DOB: 1980/11/17, 42 y.o.   MRN: [id number]  Information Source: Information source: Patient  Current Stressors:  Patient states their primary concerns and needs for treatment are:: " having symptoms" Patient states their goals for this hospitilization and ongoing recovery are:: Pt did not say Educational / Learning stressors: None Reported Employment / Job issues: None Reported Family Relationships: None Reported Surveyor, quantity / Lack of resources (include bankruptcy): None Reported Housing / Lack of housing: None Reported Physical health (include injuries & life threatening diseases): None Reported Social relationships: None Reported Substance abuse: None Reported Bereavement / Loss: None Reported  Living/Environment/Situation:  Living Arrangements: Other (Comment) Living conditions (as described by patient or guardian): Homeless, living in his car Who else lives in the home?: pt lives alone How long has patient lived in current situation?: 1 year What is atmosphere in current home: Temporary, Dangerous  Family History:  Marital status: Single Are you sexually active?: No What is your sexual orientation?: Heterosexual Has your sexual activity been affected by drugs, alcohol, medication, or emotional stress?: NA Does patient have children?: No  Childhood History:  By whom was/is the patient raised?: Mother Description of patient's relationship with caregiver when they were a child: " yes" Patient's description of current relationship with people who raised him/her: " no " How were you disciplined when you got in trouble as a child/adolescent?: pt did not say Does patient have siblings?: Yes Number of Siblings: 2 Description of patient's current relationship with siblings: No relationship Did patient suffer any verbal/emotional/physical/sexual abuse as a child?: No Did patient suffer from severe childhood  neglect?: No Has patient ever been sexually abused/assaulted/raped as an adolescent or adult?: No Was the patient ever a victim of a crime or a disaster?: No Witnessed domestic violence?: No Has patient been affected by domestic violence as an adult?: No  Education:  Highest grade of school patient has completed: 2 years of college Currently a Consulting civil engineer?: No Learning disability?: No  Employment/Work Situation:   Employment Situation: Unemployed Patient's Job has Been Impacted by Current Illness: No What is the Longest Time Patient has Held a Job?: couple years 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?: No  Financial Resources:   Financial resources: No income Does patient have a Lawyer or guardian?: No  Alcohol/Substance Abuse:   What has been your use of drugs/alcohol within the last 12 months?: beer occasionally If attempted suicide, did drugs/alcohol play a role in this?: No Alcohol/Substance Abuse Treatment Hx: Denies past history Has alcohol/substance abuse ever caused legal problems?: No  Social Support System:   Forensic psychologist System: None Type of faith/religion: Did not want to answer How does patient's faith help to cope with current illness?: Did not want to answer  Leisure/Recreation:   Do You Have Hobbies?: Yes Leisure and Hobbies: " fun things under value "  Strengths/Needs:   What is the patient's perception of their strengths?: Organization and staying motivated Patient states they can use these personal strengths during their treatment to contribute to their recovery: Nonre reported Patient states these barriers may affect/interfere with their treatment: None Reported Patient states these barriers may affect their return to the community: None Reported Other important information patient would like considered in planning for their treatment: N/A  Discharge Plan:    Currently receiving community mental health services: No Patient states concerns and preferences for  aftercare planning are: None Reported Patient states they will know when they are safe and ready for discharge when: None Reported Does patient have access to transportation?: Yes Does patient have financial barriers related to discharge medications?: Yes Patient description of barriers related to discharge medications: no income or inurance Will patient be returning to same living situation after discharge?: Yes  Summary/Recommendations:   Summary and Recommendations (to be completed by the evaluator): William Romero is a 42 y/o male who was admitted to [facility] per his report for "symptoms". According to the ED patient expressed worsening auditory hallucinations and paranoia. William Romero did not give much to CSW during assessment . Denies any stressors and says that he has been homeless in his car but did stated that he did not want any shelter resources. William Romero denies any abuse as a child/adolescent or adult. States that his parents raised him but did not go in depth about relationship. Patient is single with no children, did not say how many times he has been hospitalized for mental health, if any. William Romero does not work , has no insurance , and denies any drug use. Also is unsure if patient is connected to any outside providers.While here, William Romero can benefit from crisis stabilization, medication management, therapeutic milieu, and referrals for services.   William Romero. 02/16/2023

## 2023-02-17 DIAGNOSIS — F200 Paranoid schizophrenia: Secondary | ICD-10-CM | POA: Diagnosis not present

## 2023-02-17 MED ORDER — PROPRANOLOL HCL 10 MG PO TABS
10.0000 mg | ORAL_TABLET | Freq: Three times a day (TID) | ORAL | Status: DC
  Administered 2023-02-17 – 2023-02-22 (×15): 10 mg via ORAL
  Filled 2023-02-17 (×9): qty 1
  Filled 2023-02-17: qty 21
  Filled 2023-02-17 (×2): qty 1
  Filled 2023-02-17: qty 21
  Filled 2023-02-17 (×8): qty 1
  Filled 2023-02-17: qty 21
  Filled 2023-02-17 (×2): qty 1

## 2023-02-17 MED ORDER — HALOPERIDOL 5 MG PO TABS
5.0000 mg | ORAL_TABLET | Freq: Two times a day (BID) | ORAL | Status: DC
  Administered 2023-02-17 – 2023-02-20 (×6): 5 mg via ORAL
  Filled 2023-02-17 (×11): qty 1

## 2023-02-17 NOTE — Plan of Care (Signed)
  Problem: Education: Goal: Mental status will improve Outcome: Progressing Goal: Verbalization of understanding the information provided will improve Outcome: Progressing   Problem: Activity: Goal: Interest or engagement in activities will improve Outcome: Progressing Goal: Sleeping patterns will improve Outcome: Progressing   Problem: Health Behavior/Discharge Planning: Goal: Identification of resources available to assist in meeting health care needs will improve Outcome: Progressing Goal: Compliance with treatment plan for underlying cause of condition will improve Outcome: Progressing

## 2023-02-17 NOTE — Plan of Care (Signed)
  Problem: Education: Goal: Verbalization of understanding the information provided will improve Outcome: Progressing   Problem: Activity: Goal: Interest or engagement in activities will improve Outcome: Progressing Goal: Sleeping patterns will improve Outcome: Progressing   Problem: Coping: Goal: Ability to verbalize frustrations and anger appropriately will improve Outcome: Progressing Goal: Ability to demonstrate self-control will improve Outcome: Progressing   Problem: Health Behavior/Discharge Planning: Goal: Identification of resources available to assist in meeting health care needs will improve Outcome: Progressing   Problem: Physical Regulation: Goal: Ability to maintain clinical measurements within normal limits will improve Outcome: Progressing   Problem: Safety: Goal: Periods of time without injury will increase Outcome: Progressing   Problem: Activity: Goal: Will verbalize the importance of balancing activity with adequate rest periods Outcome: Progressing

## 2023-02-17 NOTE — Progress Notes (Signed)
   02/17/23 2100  Psych Admission Type (Psych Patients Only)  Admission Status Voluntary  Psychosocial Assessment  Patient Complaints Anxiety  Eye Contact Fair  Facial Expression Flat  Affect Sad  Speech Logical/coherent  Interaction Minimal  Motor Activity Slow  Appearance/Hygiene Unremarkable  Behavior Characteristics Cooperative;Appropriate to situation  Mood Sad  Thought Process  Coherency WDL  Content Paranoia  Delusions Paranoid  Perception Hallucinations  Hallucination Auditory  Judgment Impaired  Confusion None  Danger to Self  Current suicidal ideation? Denies  Agreement Not to Harm Self Yes  Description of Agreement Verbally contracted for safety  Danger to Others  Danger to Others None reported or observed

## 2023-02-17 NOTE — Progress Notes (Signed)
   02/17/23 0800  Psych Admission Type (Psych Patients Only)  Admission Status Voluntary  Psychosocial Assessment  Patient Complaints Anxiety  Eye Contact Fair  Facial Expression Flat  Affect Flat  Speech Logical/coherent  Interaction Minimal  Motor Activity Slow  Appearance/Hygiene Unremarkable  Behavior Characteristics Cooperative;Appropriate to situation  Mood Depressed  Thought Process  Coherency WDL  Content Paranoia  Delusions Paranoid  Perception Hallucinations  Hallucination Auditory  Judgment Impaired  Confusion None  Danger to Self  Current suicidal ideation? Denies  Agreement Not to Harm Self Yes  Description of Agreement verbal  Danger to Others  Danger to Others None reported or observed

## 2023-02-17 NOTE — Progress Notes (Signed)
   02/16/23 2040  Psych Admission Type (Psych Patients Only)  Admission Status Voluntary  Psychosocial Assessment  Patient Complaints None  Eye Contact Fair  Facial Expression Flat  Affect Sad  Speech Logical/coherent  Interaction Minimal  Motor Activity Slow  Appearance/Hygiene Unremarkable  Behavior Characteristics Appropriate to situation;Cooperative  Mood Depressed  Thought Process  Coherency WDL  Content Paranoia  Delusions Paranoid  Perception Hallucinations  Hallucination Auditory  Judgment Impaired  Confusion None  Danger to Self  Current suicidal ideation? Denies  Agreement Not to Harm Self Yes  Description of Agreement Verbally contracted for safety  Danger to Others  Danger to Others None reported or observed

## 2023-02-17 NOTE — Progress Notes (Signed)
[facility] MD Progress Note  Date: 02/17/2023 2:35 PM Name: William Romero  DOB: 12-13-1980 MRN: [id number] Unit: [id number]  CC: psychosis  REASON FOR ADMISSION   William Romero is a 42 y.o. male with PMH of MDD, ADHD and schizophrenia per chart, who presented voluntary to [facility] (02/13/2023), then admitted to [facility] Voluntary for AH and paranoia x3-4 months   Principal Problem: Schizophreniform psychosis (HCC) Diagnosis: Principal Problem:   Schizophreniform psychosis (HCC) Active Problems:   Tobacco use disorder   Cannabis use disorder  CHART REVIEW FROM LAST 24 HOURS   Adherent to scheduled meds: yes  Agitation PRNs: no Per nursing staff: no acute behavioral concerns Groups: yes, engaged Documented sleep last 24 hours: 9.75   Yesterday, the psychiatry team made the following recommendations:  DISCONTINUED invega 6 mg PO on admission INCREASED risperdal 1 mg BID to 1 mg qAM + 2 mg qPM STARTED cogentin 0.5 mg BID Held home adderall 20 mg qAM + 10 mg qAfternoon  SUBJECTIVE  Patient was initially seen asleep in bed, awoken easily, no acute distress. Patient was more vague and anxious appearing during evaluation.  Continues to report feeling anxious and more annoyed.  When asked if Risperdal helped, he stated "the problem from yesterday is still annoying".  When tried to clarify, if he meds the auditory hallucinations are still present, patient stated "it is just annoying".  This has been a response to questions on the constant of the hallucination, the frequent and the intensity.  When asked if he is still worried that the voices are removing thoughts from his mind, he again said, "they are annoying you know". He continually declined to give specific information.  Stated that he does not like the Risperdal, but declined side effects.  However when asked why he had 3 Vistaril as needed yesterday, patient stated "I do not know".  When asked if he was feeling anxious,  patient stated, "I guess". Reported his sleep and appetite remained stable and appropriate.  Continues to endorse AH, possibly thought withdrawal Denied SI/HI/VH, paranoia or other first rank symptoms.  Review of Systems  Respiratory:  Negative for shortness of breath.   Cardiovascular:  Negative for chest pain.  Gastrointestinal:  Negative for nausea and vomiting.  Neurological:  Negative for dizziness and headaches.   HISTORY  Past Psychiatric History:  Diagnoses: MDD, ADHD and schizophrenia Inpatient treatment: ~6 between 2012-2019 Suicide: Denied Homicide: Denied Medication history: Unknown Medication compliance: Poor, except to adderall   Past Medical/Surgical History:  Medical Diagnoses:NA Prior Hosp: NA Prior Surgeries/Trauma: NA Concussions/Head Trauma/LOC:Denied Seizures: Denied PCP: Philip Aspen, Limmie Patricia, MD  Allergies: Patient has no known allergies.    Family Psychiatric History:  Denied    Social History:  Housing: unhoused Finances: unemployed Children: NA Guns/Weapons: Denied Legal: jail - reported for self defense   Substance Use History: Alcohol: last drink was ~5 d prior to admission, 2-3 beers. Prior to that ~3 weeks prior was 1 pint of whiskey  Nicotine:  reports that he has quit smoking. His smoking use included cigarettes. He smoked an average of .5 packs per day. He has never used smokeless tobacco. Marijuana: Yes IV drug use: Denied Stimulants: Possible stimulant misuse  Opiates: Denied Sedative/hypnotics: Denied Hallucinogens: Denied H/O withdrawals, blackouts: Denied H/O DT: Denied H/O Detox / Rehab: Denied DUI/DWI: Denied  Past Medical History:  Past Medical History:  Diagnosis Date   Schizophrenia (HCC)     Past Surgical History:  Procedure Laterality  Date   APPENDECTOMY     Family History:  History reviewed. No pertinent family history. Social History:  Social History   Substance and Sexual Activity  Alcohol Use  Not Currently   Alcohol/week: 5.0 standard drinks of alcohol   Types: 5 Cans of beer per week     Social History   Substance and Sexual Activity  Drug Use No    Social History   Socioeconomic History   Marital status: Single    Spouse name: Not on file   Number of children: Not on file   Years of education: Not on file   Highest education level: Not on file  Occupational History   Not on file  Tobacco Use   Smoking status: Former    Packs/day: .5    Types: Cigarettes   Smokeless tobacco: Never  Vaping Use   Vaping Use: Never used  Substance and Sexual Activity   Alcohol use: Not Currently    Alcohol/week: 5.0 standard drinks of alcohol    Types: 5 Cans of beer per week   Drug use: No   Sexual activity: Not on file  Other Topics Concern   Not on file  Social History Narrative   Not on file   Social Determinants of Health   Financial Resource Strain: Not on file  Food Insecurity: No Food Insecurity (02/14/2023)   Hunger Vital Sign    Worried About Running Out of Food in the Last Year: Never true    Ran Out of Food in the Last Year: Never true  Transportation Needs: No Transportation Needs (02/14/2023)   PRAPARE - Administrator, Civil Service (Medical): No    Lack of Transportation (Non-Medical): No  Physical Activity: Not on file  Stress: Not on file  Social Connections: Not on file   Additional Social History:                         Current Medications: Current Facility-Administered Medications  Medication Dose Route Frequency Provider Last Rate Last Admin   acetaminophen (TYLENOL) tablet 650 mg  650 mg Oral Q6H PRN Ardis Hughs, NP       alum & mag hydroxide-simeth (MAALOX/MYLANTA) 200-200-20 MG/5ML suspension 30 mL  30 mL Oral Q4H PRN Ardis Hughs, NP       benztropine (COGENTIN) tablet 0.5 mg  0.5 mg Oral BID Princess Bruins, DO   0.5 mg at 02/17/23 0850   diphenhydrAMINE (BENADRYL) capsule 50 mg  50 mg Oral TID PRN  Ardis Hughs, NP       Or   diphenhydrAMINE (BENADRYL) injection 50 mg  50 mg Intramuscular TID PRN Ardis Hughs, NP       haloperidol (HALDOL) tablet 5 mg  5 mg Oral TID PRN Ardis Hughs, NP       Or   haloperidol lactate (HALDOL) injection 5 mg  5 mg Intramuscular TID PRN Ardis Hughs, NP       haloperidol (HALDOL) tablet 5 mg  5 mg Oral Q12H Massengill, Nathan, MD       hydrOXYzine (ATARAX) tablet 25 mg  25 mg Oral TID PRN Ardis Hughs, NP   25 mg at 02/17/23 1201   LORazepam (ATIVAN) tablet 2 mg  2 mg Oral TID PRN Ardis Hughs, NP       Or   LORazepam (ATIVAN) injection 2 mg  2 mg Intramuscular TID PRN Ardis Hughs,  NP       magnesium hydroxide (MILK OF MAGNESIA) suspension 30 mL  30 mL Oral Daily PRN Ardis Hughs, NP       propranolol (INDERAL) tablet 10 mg  10 mg Oral Q8H Princess Bruins, DO       traZODone (DESYREL) tablet 50 mg  50 mg Oral QHS PRN Ardis Hughs, NP   50 mg at 02/16/23 2059    OBJECTIVE  BP (!) 125/92 (BP Location: Right Arm)   Pulse (!) 124   Temp 97.6 F (36.4 C) (Oral)   Resp 16   Ht 6' (1.829 m)   Wt 99.3 kg   SpO2 96%   BMI 29.70 kg/m   Physical Exam Physical Exam Vitals and nursing note reviewed.  Constitutional:      General: He is not in acute distress.    Appearance: He is not ill-appearing, toxic-appearing or diaphoretic.  HENT:     Head: Normocephalic.  Pulmonary:     Effort: Pulmonary effort is normal. No respiratory distress.  Neurological:     Mental Status: He is alert.     AIMS:   ,  ,  ,  ,      Psychiatric Specialty Exam: Presentation General Appearance: Disheveled  Eye Contact: Fair  Speech: Clear and Coherent; Normal Rate  Volume: Normal  Handedness:Right   Mood and Affect  Mood: Anxious  Affect: Appropriate; Congruent; Restricted   Thought Process  Thought Process: Coherent; Goal Directed  Descriptions of Associations: Circumstantial   Thought  Content Suicidal Thoughts:No  Homicidal Thoughts:No  Hallucinations:Auditory continues to hear voices of females and males having converstation  Ideas of Reference:-- (thought withdrawal)  Thought Content:Rumination; Perseveration   Sensorium  Memory:Immediate Good  Judgment:Impaired  Insight:Shallow   Executive Functions  Orientation:Full (Time, Place and Person)  Language:Good  Concentration:Good  Attention:Good  Recall:Good  Fund of Knowledge:Good   Psychomotor Activity  Psychomotor Activity:Normal  Assets  Assets:Communication Skills; Desire for Improvement; Resilience  Sleep  Quality:Good  Documented sleep last 24 hours: 9.75   Lab Results:  No results found for this or any previous visit (from the past 48 hour(s)).  Blood Alcohol level:  Lab Results  Component Value Date   ETH <10 02/13/2023   ETH <10 12/02/2017    Metabolic Disorder Labs: Lab Results  Component Value Date   HGBA1C 5.0 02/13/2023   MPG 96.8 02/13/2023   No results found for: "PROLACTIN" Lab Results  Component Value Date   CHOL 150 02/13/2023   TRIG 100 02/13/2023   HDL 53 02/13/2023   CHOLHDL 2.8 02/13/2023   VLDL 20 02/13/2023   LDLCALC 77 02/13/2023   ASSESSMENT / PLAN  Diagnoses / Active Problems: Principal Problem:   Schizophreniform psychosis (HCC) Active Problems:   Tobacco use disorder   Cannabis use disorder  Safety and Monitoring: Voluntary admission to inpatient psychiatric unit for safety, stabilization and treatment    2. Psychiatric Diagnoses and Treatment:  Schizophreniform Presented with psychosis x3-57mo, unclear etiology. Broad ddx, including substance induced, acute stress given his years of psychiatric stability (chart shows regular visit to PCP for ADHD and only few recent ED visits over the past year). He was started on invega PO by prior provider however will switch to more potent rx, risperdal He is even more vague and guarded.   Continues to endorse AH and not withdraw, since patient would not give further details.  Although patient denies side effects to Risperdal, he did use Vistaril x  3 PRN yesterday, suspect akathisia although patient does not appear restless at this time, starting propranolol per below.  Propranolol also will help with patient's mild elevated HR, elevated HR likely 2/2 anxiety.  Also patient requested medication change, so switching Risperdal to Haldol. DISCONTINUED risperdal 1 mg BID to 1 mg qAM + 2 mg qPM - per patient's request  STARTED cogentin 0.5 mg q12h STARTED haldol 5 mg q12h  STARTED propranolol 10 mg q8h   DISCONTINUED invega 6 mg PO on admission - opted for higher potency  Tobacco use d/o  NRTs Encouraged cessation    Cannabis use d/o UDS positive. Encouraged cessation               Stimulant misuse d/o  Reported adherent to adderall for years and reported still taking it, however UDS negative for amphetamines. Possible diversion, maybe he didn't take it 24h prior to UDS. Held home adderall 20mg  + 10mg  daily   3. Medical Issues Being Addressed:  NA   4. Routine and other pertinent labs:   BMI:29.7  CBC Hb 12.2, CMP 139, lipid wnl, a1c wnl, tsh wnl UDS+ marijuana Qtc 448 5. Disposition Planning:  Tentative Date: TBA  Barrier: psychosis Location: TBA   Treatment Plan Summary: I certify that inpatient services furnished can reasonably be expected to improve the patient's condition.   Daily contact with patient to assess and evaluate symptoms and progress in treatment and Medication management Daily contact with patient to assess and evaluate symptoms and progress in treatment Patient's case to be discussed in multi-disciplinary team meeting Observation Level: q15 minute checks  Vital signs: q12 hours Precautions: suicide, elopement, and assault The risks/benefits/side-effects/alternatives to this medication were discussed in detail with the patient and time was given  for questions. The patient consents to medication trial. The patient consents to medication trial. FDA black box warnings, if present, were discussed. Metabolic profile and EKG monitoring obtained while on an atypical antipsychotic  Encouraged patient to participate in unit milieu and in scheduled group therapies   Total Time spent with patient:  Patient's case was discussed with Attending, see attestation for more information.   Signed: Princess Bruins, DO Psychiatry Resident, PGY-2 [facility]  [address] [place], [place] [address] Ph: [phone] Fax: [phone]

## 2023-02-18 NOTE — Group Note (Unsigned)
Date:  02/18/2023 Time:  9:32 PM  Group Topic/Focus:  Wrap-Up Group:   The focus of this group is to help patients review their daily goal of treatment and discuss progress on daily workbooks.     Participation Level:  {BHH PARTICIPATION LEVEL:22264}  Participation Quality:  {BHH PARTICIPATION QUALITY:22265}  Affect:  {BHH AFFECT:22266}  Cognitive:  {BHH COGNITIVE:22267}  Insight: {BHH Insight2:20797}  Engagement in Group:  {BHH ENGAGEMENT IN GROUP:22268}  Modes of Intervention:  {BHH MODES OF INTERVENTION:22269}  Additional Comments:  ***  Naleah Kofoed Dacosta 02/18/2023, 9:32 PM  

## 2023-02-18 NOTE — Group Note (Deleted)
Date:  02/18/2023 Time:  9:42 PM  Group Topic/Focus:  Wrap-Up Group:   The focus of this group is to help patients review their daily goal of treatment and discuss progress on daily workbooks.     Participation Level:  {BHH PARTICIPATION LEVEL:22264}  Participation Quality:  {BHH PARTICIPATION QUALITY:22265}  Affect:  {BHH AFFECT:22266}  Cognitive:  {BHH COGNITIVE:22267}  Insight: {BHH Insight2:20797}  Engagement in Group:  {BHH ENGAGEMENT IN GROUP:22268}  Modes of Intervention:  {BHH MODES OF INTERVENTION:22269}  Additional Comments:  ***  Zelig Gacek Dacosta 02/18/2023, 9:42 PM  

## 2023-02-18 NOTE — Group Note (Signed)
Date:  02/18/2023 Time:  7:06 PM  Group Topic/Focus:  Goals Group:   The focus of this group is to help patients establish daily goals to achieve during treatment and discuss how the patient can incorporate goal setting into their daily lives to aide in recovery. Orientation:   The focus of this group is to educate the patient on the purpose and policies of crisis stabilization and provide a format to answer questions about their admission.  The group details unit policies and expectations of patients while admitted.    Participation Level:  Did Not Attend  Participation Quality:   n/a  Affect:   n/a  Cognitive:   n/a  Insight: None  Engagement in Group:   n/a  Modes of Intervention:   n/a  Additional Comments:   Pt did not attend.  Edmund Hilda Isael Stille 02/18/2023, 7:06 PM

## 2023-02-18 NOTE — Group Note (Signed)
Date:  02/18/2023 Time:  5:24 PM  Group Topic/Focus:  Goals Group:   The focus of this group is to help patients establish daily goals to achieve during treatment and discuss how the patient can incorporate goal setting into their daily lives to aide in recovery. Orientation:   The focus of this group is to educate the patient on the purpose and policies of crisis stabilization and provide a format to answer questions about their admission.  The group details unit policies and expectations of patients while admitted.    Participation Level:  Did Not Attend  Participation Quality:   n/a  Affect:   n/a  Cognitive:   n/a  Insight: None  Engagement in Group:   n/a  Modes of Intervention:   n/a  Additional Comments:   Pt did not attend  Stark Bray 02/18/2023, 5:24 PM

## 2023-02-18 NOTE — Progress Notes (Signed)
[facility] MD Progress Note  Date: 02/18/2023 12:33 PM Name: William Romero  DOB: 1981/03/27 MRN: [id number] Unit: [id number]  CC: psychosis  REASON FOR ADMISSION   William Romero is a 42 y.o. male with PMH of MDD, ADHD and schizophrenia per chart, who presented voluntary to [facility] (02/13/2023), then admitted to [facility] Voluntary for AH and paranoia x3-4 months   Principal Problem: Schizophreniform psychosis (HCC) Diagnosis: Principal Problem:   Schizophreniform psychosis (HCC) Active Problems:   Tobacco use disorder   Cannabis use disorder  CHART REVIEW FROM LAST 24 HOURS   Adherent to scheduled meds: yes  Agitation PRNs: no Per nursing staff: no acute behavioral concerns Groups: yes, engaged Documented sleep last 24 hours: 9.75   Yesterday, the psychiatry team made the following recommendations:  DISCONTINUED invega 6 mg PO on admission INCREASED risperdal 1 mg BID to 1 mg qAM + 2 mg qPM STARTED cogentin 0.5 mg BID Held home adderall 20 mg qAM + 10 mg qAfternoon  SUBJECTIVE  Patient was initially seen asleep in bed, awoken easily, no acute distress. Patient calm, still vague during evaluation.  Reported there has been no change in his age, stated that they are still occurring sometime in the morning, are still derogatory and contradicting his thoughts.  He is still concerned that they can take the information his mind and give it somewhere else.  Again patient denied any changes in intensity, frequency, length, content of the AH.  He denied that it is command auditory.  He continues to answer all questions with "they are just annoying". Reported his sleep and appetite remained stable and appropriate.  Continues to endorse AH, possibly thought withdrawal Denied SI/HI/VH, paranoia or other first rank symptoms.  Review of Systems  Constitutional:  Negative for malaise/fatigue.  Respiratory:  Negative for shortness of breath.   Cardiovascular:  Negative for chest  pain.  Gastrointestinal:  Negative for abdominal pain, constipation, diarrhea, nausea and vomiting.  Neurological:  Negative for dizziness and headaches.   HISTORY  Past Psychiatric History:  Diagnoses: MDD, ADHD and schizophrenia Inpatient treatment: ~6 between 2012-2019 Suicide: Denied Homicide: Denied Medication history: Unknown Medication compliance: Poor, except to adderall   Past Medical/Surgical History:  Medical Diagnoses:NA Prior Hosp: NA Prior Surgeries/Trauma: NA Concussions/Head Trauma/LOC:Denied Seizures: Denied PCP: Philip Aspen, Limmie Patricia, MD  Allergies: Patient has no known allergies.    Family Psychiatric History:  Denied    Social History:  Housing: unhoused Finances: unemployed Children: NA Guns/Weapons: Denied Legal: jail - reported for self defense   Substance Use History: Alcohol: last drink was ~5 d prior to admission, 2-3 beers. Prior to that ~3 weeks prior was 1 pint of whiskey  Nicotine:  reports that he has quit smoking. His smoking use included cigarettes. He smoked an average of .5 packs per day. He has never used smokeless tobacco. Marijuana: Yes IV drug use: Denied Stimulants: Possible stimulant misuse  Opiates: Denied Sedative/hypnotics: Denied Hallucinogens: Denied H/O withdrawals, blackouts: Denied H/O DT: Denied H/O Detox / Rehab: Denied DUI/DWI: Denied  Past Medical History:  Past Medical History:  Diagnosis Date   Schizophrenia (HCC)     Past Surgical History:  Procedure Laterality Date   APPENDECTOMY     Family History:  History reviewed. No pertinent family history. Social History:  Social History   Substance and Sexual Activity  Alcohol Use Not Currently   Alcohol/week: 5.0 standard drinks of alcohol   Types: 5 Cans of beer per week  Social History   Substance and Sexual Activity  Drug Use No    Social History   Socioeconomic History   Marital status: Single    Spouse name: Not on file   Number of  children: Not on file   Years of education: Not on file   Highest education level: Not on file  Occupational History   Not on file  Tobacco Use   Smoking status: Former    Packs/day: .5    Types: Cigarettes   Smokeless tobacco: Never  Vaping Use   Vaping Use: Never used  Substance and Sexual Activity   Alcohol use: Not Currently    Alcohol/week: 5.0 standard drinks of alcohol    Types: 5 Cans of beer per week   Drug use: No   Sexual activity: Not on file  Other Topics Concern   Not on file  Social History Narrative   Not on file   Social Determinants of Health   Financial Resource Strain: Not on file  Food Insecurity: No Food Insecurity (02/14/2023)   Hunger Vital Sign    Worried About Running Out of Food in the Last Year: Never true    Ran Out of Food in the Last Year: Never true  Transportation Needs: No Transportation Needs (02/14/2023)   PRAPARE - Administrator, Civil Service (Medical): No    Lack of Transportation (Non-Medical): No  Physical Activity: Not on file  Stress: Not on file  Social Connections: Not on file   Additional Social History:                         Current Medications: Current Facility-Administered Medications  Medication Dose Route Frequency Provider Last Rate Last Admin   acetaminophen (TYLENOL) tablet 650 mg  650 mg Oral Q6H PRN Ardis Hughs, NP       alum & mag hydroxide-simeth (MAALOX/MYLANTA) 200-200-20 MG/5ML suspension 30 mL  30 mL Oral Q4H PRN Ardis Hughs, NP       benztropine (COGENTIN) tablet 0.5 mg  0.5 mg Oral BID Princess Bruins, DO   0.5 mg at 02/18/23 1610   diphenhydrAMINE (BENADRYL) capsule 50 mg  50 mg Oral TID PRN Ardis Hughs, NP       Or   diphenhydrAMINE (BENADRYL) injection 50 mg  50 mg Intramuscular TID PRN Ardis Hughs, NP       haloperidol (HALDOL) tablet 5 mg  5 mg Oral TID PRN Ardis Hughs, NP       Or   haloperidol lactate (HALDOL) injection 5 mg  5 mg  Intramuscular TID PRN Ardis Hughs, NP       haloperidol (HALDOL) tablet 5 mg  5 mg Oral Q12H Massengill, Nathan, MD   5 mg at 02/18/23 0806   hydrOXYzine (ATARAX) tablet 25 mg  25 mg Oral TID PRN Ardis Hughs, NP   25 mg at 02/18/23 9604   LORazepam (ATIVAN) tablet 2 mg  2 mg Oral TID PRN Ardis Hughs, NP       Or   LORazepam (ATIVAN) injection 2 mg  2 mg Intramuscular TID PRN Ardis Hughs, NP       magnesium hydroxide (MILK OF MAGNESIA) suspension 30 mL  30 mL Oral Daily PRN Ardis Hughs, NP       propranolol (INDERAL) tablet 10 mg  10 mg Oral Q8H Princess Bruins, DO   10 mg at 02/18/23 [phone]  traZODone (DESYREL) tablet 50 mg  50 mg Oral QHS PRN Ardis Hughs, NP   50 mg at 02/17/23 2052    OBJECTIVE  BP 117/77   Pulse 96   Temp 98.2 F (36.8 C) (Oral)   Resp 16   Ht 6' (1.829 m)   Wt 99.3 kg   SpO2 96%   BMI 29.70 kg/m   Physical Exam Physical Exam Vitals and nursing note reviewed.  Constitutional:      General: He is not in acute distress.    Appearance: He is not ill-appearing, toxic-appearing or diaphoretic.  HENT:     Head: Normocephalic.  Pulmonary:     Effort: Pulmonary effort is normal. No respiratory distress.  Neurological:     Mental Status: He is alert.     AIMS:   ,  ,  ,  ,      Psychiatric Specialty Exam: Presentation General Appearance: Disheveled  Eye Contact: Fair  Speech: Clear and Coherent; Normal Rate  Volume: Normal  Handedness:Right   Mood and Affect  Mood: Dysphoric  Affect: Appropriate; Congruent; Restricted   Thought Process  Thought Process: Coherent; Goal Directed  Descriptions of Associations: Circumstantial   Thought Content Suicidal Thoughts:No  Homicidal Thoughts:No  Hallucinations:Auditory continues to hear voices of females and males having converstation  Ideas of Reference:-- (thought withdrawal)  Thought Content:Rumination; Perseveration   Sensorium  Memory:Immediate  Good  Judgment:Impaired  Insight:Shallow   Executive Functions  Orientation:Full (Time, Place and Person)  Language:Good  Concentration:Good  Attention:Good  Recall:Good  Fund of Knowledge:Good   Psychomotor Activity  Psychomotor Activity:Normal  Assets  Assets:Communication Skills; Desire for Improvement; Resilience  Sleep  Quality:Good  Documented sleep last 24 hours: 9.75   Lab Results:  No results found for this or any previous visit (from the past 48 hour(s)).  Blood Alcohol level:  Lab Results  Component Value Date   ETH <10 02/13/2023   ETH <10 12/02/2017    Metabolic Disorder Labs: Lab Results  Component Value Date   HGBA1C 5.0 02/13/2023   MPG 96.8 02/13/2023   No results found for: "PROLACTIN" Lab Results  Component Value Date   CHOL 150 02/13/2023   TRIG 100 02/13/2023   HDL 53 02/13/2023   CHOLHDL 2.8 02/13/2023   VLDL 20 02/13/2023   LDLCALC 77 02/13/2023   ASSESSMENT / PLAN  Diagnoses / Active Problems: Principal Problem:   Schizophreniform psychosis (HCC) Active Problems:   Tobacco use disorder   Cannabis use disorder  Safety and Monitoring: Voluntary admission to inpatient psychiatric unit for safety, stabilization and treatment    2. Psychiatric Diagnoses and Treatment:  Schizophreniform Presented with psychosis x3-78mo, unclear etiology. Broad ddx, including substance induced, acute stress given his years of psychiatric stability (chart shows regular visit to PCP for ADHD and only few recent ED visits over the past year). He was started on invega PO by prior provider however will switch to more potent rx, risperdal He is even more vague and guarded.  Continues to endorse AH and not withdraw, since patient would not give further details since admission, however he appears to be more forth coming with attending.  Will not make any med changes at this time, as this could be substance-induced, which is expected to metabolize out of  his system and resulted in improved AH. Propranolol appears to be helping, he appears less anxious. Continued cogentin 0.5 mg q12h Continued haldol 5 mg q12h  Continued propranolol 10 mg q8h   DISCONTINUED  invega 6 mg PO on admission - opted for higher potency DISCONTINUED risperdal 1 mg BID to 1 mg qAM + 2 mg qPM - per patient's request   Tobacco use d/o  NRTs Encouraged cessation    Cannabis use d/o UDS positive. Encouraged cessation               Stimulant misuse d/o  Reported adherent to adderall for years and reported still taking it, however UDS negative for amphetamines. Possible diversion, maybe he didn't take it 24h prior to UDS. Held home adderall 20mg  + 10mg  daily   3. Medical Issues Being Addressed:  NA   4. Routine and other pertinent labs:   BMI:29.7  CBC Hb 12.2, CMP 139, lipid wnl, a1c wnl, tsh wnl UDS+ marijuana Qtc 448 5. Disposition Planning:  Tentative Date: 6/18 Barrier: psychosis Location: TBA   Treatment Plan Summary: I certify that inpatient services furnished can reasonably be expected to improve the patient's condition.   Daily contact with patient to assess and evaluate symptoms and progress in treatment and Medication management Daily contact with patient to assess and evaluate symptoms and progress in treatment Patient's case to be discussed in multi-disciplinary team meeting Observation Level: q15 minute checks  Vital signs: q12 hours Precautions: suicide, elopement, and assault The risks/benefits/side-effects/alternatives to this medication were discussed in detail with the patient and time was given for questions. The patient consents to medication trial. The patient consents to medication trial. FDA black box warnings, if present, were discussed. Metabolic profile and EKG monitoring obtained while on an atypical antipsychotic  Encouraged patient to participate in unit milieu and in scheduled group therapies   Total Time spent with  patient:  Patient's case was discussed with Attending, see attestation for more information.   Signed: Princess Bruins, DO Psychiatry Resident, PGY-2 [facility] - Adult  [address] [place], [place] [address] Ph: [phone] Fax: [phone]

## 2023-02-18 NOTE — Progress Notes (Signed)
Adult Psychoeducational Group Note  Date:  02/18/2023 Time:  9:44 PM  Group Topic/Focus:  Wrap-Up Group:   The focus of this group is to help patients review their daily goal of treatment and discuss progress on daily workbooks.  Participation Level:  Did Not Attend

## 2023-02-18 NOTE — Progress Notes (Signed)
   02/18/23 1020  Psych Admission Type (Psych Patients Only)  Admission Status Voluntary  Psychosocial Assessment  Patient Complaints Anxiety  Eye Contact Fair  Facial Expression Flat  Affect Appropriate to circumstance  Speech Logical/coherent  Interaction Guarded  Motor Activity Slow  Behavior Characteristics Appropriate to situation;Cooperative  Mood Depressed  Thought Process  Coherency WDL  Content Paranoia  Delusions Paranoid  Perception Hallucinations  Hallucination Auditory  Judgment Impaired  Confusion None  Danger to Self  Current suicidal ideation? Denies  Agreement Not to Harm Self Yes  Description of Agreement verbal  Danger to Others  Danger to Others None reported or observed

## 2023-02-19 DIAGNOSIS — F2081 Schizophreniform disorder: Secondary | ICD-10-CM

## 2023-02-19 NOTE — [facility] Group Notes (Signed)
[facility] Group Notes:  (Nursing/MHT/Case Management/Adjunct)  Date:  02/19/2023  Time: 1000  Type of Therapy:  Psychoeducational Skills  Participation Level:  did not attend.   Participation Quality:    Affect:    Cognitive:   Insight  Engagement in Group:    Modes of Intervention:  Discussion, Education, and Exploration  Summary of Progress/Problems: Patients were given two poems to read, one titled ''watch your thoughts for they become your actions'' and another '' the owl and the chimpanzee'' by Jo Camacho. Patients were given education on positive reframing and asked to identify one negative belief about themselves they would like to heal from to impact positive mental well being. Discussion, education and education of healthy coping skills were also discussed in group. Pt did not attend.   Percilla Tweten 02/19/2023, 11:11 AM 

## 2023-02-19 NOTE — Progress Notes (Signed)
[facility] MD Progress Note  Date: 02/19/2023 11:27 AM Name: William Romero  DOB: 21-Apr-1981 MRN: [id number] Unit: [id number]  CC: psychosis  REASON FOR ADMISSION   William Romero is a 42 y.o. male with PMH of MDD, ADHD and schizophrenia per chart, who presented voluntary to [facility] (02/13/2023), then admitted to [facility] for AH and paranoia x3-4 months   Principal Problem: Schizophreniform psychosis (HCC) Diagnosis: Principal Problem:   Schizophreniform psychosis (HCC) Active Problems:   Tobacco use disorder   Cannabis use disorder  CHART REVIEW FROM LAST 24 HOURS   Adherent to scheduled meds: yes  Agitation PRNs: no Per nursing staff: no acute behavioral concerns Groups: yes, engaged Documented sleep last 24 hours: 11.5   Yesterday, the psychiatry team made the following recommendations:  Continued cogentin 0.5 mg q12h Continued haldol 5 mg q12h  Continued propranolol 10 mg q8h  SUBJECTIVE  Patient was initially seen asleep in bed, awoken easily, no acute distress. Patient was less anxious appearing, more energized, still vague, not forthcoming with information during evaluation.  Reported that there is marginal improvement in his auditory hallucination, however reported that they are still significant.  Still derogatory and challenging his thoughts.  He finds them annoying.  Again he continues to decline giving further information, deflecting the question with answers "we just annoying".  With further probing, patient became irritable.  He is still concerned that the [facility] are a liability for getting a new job, as they can give information he knows to other people. reported feeling less anxious, did not need the Vistaril as frequently yesterday. Stated that he wanted to try different medication, other than Haldol.  With persistent probing, he stated because he knows a guy name "Hal" and does not want to be on Haldol.  Stated he does not want that kind of  coincidence.  Patient repeated the same phrase when asked to explain further.  Denied worried about something that will happen or something will hurt him so they will hurt the other guy.  He is adamant in saying it is his preference.  Reported his sleep and appetite remained stable and appropriate.  Continues to endorse AH, possibly thought withdrawal. Denied SI/HI/VH, paranoia or other first rank symptoms.  Review of Systems  Constitutional:  Negative for malaise/fatigue.  Respiratory:  Negative for shortness of breath.   Cardiovascular:  Negative for chest pain.  Gastrointestinal:  Negative for abdominal pain, constipation, diarrhea, nausea and vomiting.  Neurological:  Negative for dizziness and headaches.   HISTORY  Past Psychiatric History:  Diagnoses: MDD, ADHD and schizophrenia Inpatient treatment: ~6 between 2012-2019 Suicide: Denied Homicide: Denied Medication history: Unknown Medication compliance: Poor, except to adderall   Past Medical/Surgical History:  Medical Diagnoses:NA Prior Hosp: NA Prior Surgeries/Trauma: NA Concussions/Head Trauma/LOC:Denied Seizures: Denied PCP: William Romero, William Patricia, MD  Allergies: Patient has no known allergies.    Family Psychiatric History:  Denied    Social History:  Housing: unhoused Finances: unemployed Children: NA Guns/Weapons: Denied Legal: jail - reported for self defense   Substance Use History: Alcohol: last drink was ~5 d prior to admission, 2-3 beers. Prior to that ~3 weeks prior was 1 pint of whiskey  Nicotine:  reports that he has quit smoking. His smoking use included cigarettes. He smoked an average of .5 packs per day. He has never used smokeless tobacco. Marijuana: Yes IV drug use: Denied Stimulants: Possible stimulant misuse  Opiates: Denied Sedative/hypnotics: Denied Hallucinogens: Denied H/O withdrawals, blackouts: Denied  H/O DT: Denied H/O Detox / Rehab: Denied DUI/DWI: Denied  Past Medical  History:  Past Medical History:  Diagnosis Date   Schizophrenia [facility])     Past Surgical History:  Procedure Laterality Date   APPENDECTOMY     Family History:  History reviewed. No pertinent family history. Social History:  Social History   Substance and Sexual Activity  Alcohol Use Not Currently   Alcohol/week: 5.0 standard drinks of alcohol   Types: 5 Cans of beer per week     Social History   Substance and Sexual Activity  Drug Use No    Social History   Socioeconomic History   Marital status: Single    Spouse name: Not on file   Number of children: Not on file   Years of education: Not on file   Highest education level: Not on file  Occupational History   Not on file  Tobacco Use   Smoking status: Former    Packs/day: .5    Types: Cigarettes   Smokeless tobacco: Never  Vaping Use   Vaping Use: Never used  Substance and Sexual Activity   Alcohol use: Not Currently    Alcohol/week: 5.0 standard drinks of alcohol    Types: 5 Cans of beer per week   Drug use: No   Sexual activity: Not on file  Other Topics Concern   Not on file  Social History Narrative   Not on file   Social Determinants of Health   Financial Resource Strain: Not on file  Food Insecurity: No Food Insecurity (02/14/2023)   Hunger Vital Sign    Worried About Running Out of Food in the Last Year: Never true    Ran Out of Food in the Last Year: Never true  Transportation Needs: No Transportation Needs (02/14/2023)   PRAPARE - Administrator, Civil Service (Medical): No    Lack of Transportation (Non-Medical): No  Physical Activity: Not on file  Stress: Not on file  Social Connections: Not on file   Additional Social History:                         Current Medications: Current Facility-Administered Medications  Medication Dose Route Frequency Provider Last Rate Last Admin   acetaminophen (TYLENOL) tablet 650 mg  650 mg Oral Q6H PRN Ardis Hughs, NP        alum & mag hydroxide-simeth (MAALOX/MYLANTA) 200-200-20 MG/5ML suspension 30 mL  30 mL Oral Q4H PRN Ardis Hughs, NP       benztropine (COGENTIN) tablet 0.5 mg  0.5 mg Oral BID Princess Bruins, DO   0.5 mg at 02/19/23 0817   diphenhydrAMINE (BENADRYL) capsule 50 mg  50 mg Oral TID PRN Ardis Hughs, NP       Or   diphenhydrAMINE (BENADRYL) injection 50 mg  50 mg Intramuscular TID PRN Ardis Hughs, NP       haloperidol (HALDOL) tablet 5 mg  5 mg Oral TID PRN Ardis Hughs, NP       Or   haloperidol lactate (HALDOL) injection 5 mg  5 mg Intramuscular TID PRN Ardis Hughs, NP       haloperidol (HALDOL) tablet 5 mg  5 mg Oral Q12H Massengill, Harrold Donath, MD   5 mg at 02/19/23 0817   hydrOXYzine (ATARAX) tablet 25 mg  25 mg Oral TID PRN Ardis Hughs, NP   25 mg at 02/18/23 [phone]  LORazepam (ATIVAN) tablet 2 mg  2 mg Oral TID PRN Ardis Hughs, NP       Or   LORazepam (ATIVAN) injection 2 mg  2 mg Intramuscular TID PRN Ardis Hughs, NP       magnesium hydroxide (MILK OF MAGNESIA) suspension 30 mL  30 mL Oral Daily PRN Ardis Hughs, NP       propranolol (INDERAL) tablet 10 mg  10 mg Oral Q8H Princess Bruins, DO   10 mg at 02/19/23 0643   traZODone (DESYREL) tablet 50 mg  50 mg Oral QHS PRN Ardis Hughs, NP   50 mg at 02/18/23 2109    OBJECTIVE  BP 119/78   Pulse 99   Temp 98 F (36.7 C) (Oral)   Resp 16   Ht 6' (1.829 m)   Wt 99.3 kg   SpO2 97%   BMI 29.70 kg/m   Physical Exam Physical Exam Vitals and nursing note reviewed.  Constitutional:      General: He is not in acute distress.    Appearance: He is not ill-appearing, toxic-appearing or diaphoretic.  HENT:     Head: Normocephalic.  Pulmonary:     Effort: Pulmonary effort is normal. No respiratory distress.  Neurological:     Mental Status: He is alert.     AIMS:   ,  ,  ,  ,      Psychiatric Specialty Exam: Presentation General Appearance: Disheveled  Eye Contact:  Fair  Speech: Clear and Coherent; Normal Rate  Volume: Normal  Handedness:Right   Mood and Affect  Mood: Dysphoric  Affect: Appropriate; Congruent; Restricted   Thought Process  Thought Process: Coherent; Goal Directed  Descriptions of Associations: Circumstantial   Thought Content Suicidal Thoughts:No  Homicidal Thoughts:No  Hallucinations:Auditory continues to hear voices of females and males having converstation  Ideas of Reference:-- (thought withdrawal)  Thought Content:Rumination; Perseveration   Sensorium  Memory:Immediate Good  Judgment:Impaired  Insight:Shallow   Executive Functions  Orientation:Full (Time, Place and Person)  Language:Good  Concentration:Good  Attention:Good  Recall:Good  Fund of Knowledge:Good   Psychomotor Activity  Psychomotor Activity:Normal  Assets  Assets:Communication Skills; Desire for Improvement; Resilience  Sleep  Quality:Good  Documented sleep last 24 hours: 11.5   Lab Results:  No results found for this or any previous visit (from the past 48 hour(s)).  Blood Alcohol level:  Lab Results  Component Value Date   ETH <10 02/13/2023   ETH <10 12/02/2017    Metabolic Disorder Labs: Lab Results  Component Value Date   HGBA1C 5.0 02/13/2023   MPG 96.8 02/13/2023   No results found for: "PROLACTIN" Lab Results  Component Value Date   CHOL 150 02/13/2023   TRIG 100 02/13/2023   HDL 53 02/13/2023   CHOLHDL 2.8 02/13/2023   VLDL 20 02/13/2023   LDLCALC 77 02/13/2023   ASSESSMENT / PLAN  Diagnoses / Active Problems: Principal Problem:   Schizophreniform psychosis (HCC) Active Problems:   Tobacco use disorder   Cannabis use disorder  Safety and Monitoring: Voluntary admission to inpatient psychiatric unit for safety, stabilization and treatment    2. Psychiatric Diagnoses and Treatment:  Schizophreniform Presented with psychosis x3-10mo, unclear etiology. Broad ddx, including substance  induced, acute stress given his years of psychiatric stability (chart shows regular visit to PCP for ADHD and only few recent ED visits over the past year). He was started on invega PO by prior provider however will switch to more potent rx, risperdal  Propranolol appears to be helping, he appears less anxious, did not need as much as needed Vistaril yesterday.  Also appeared less guarded on exam.  Reportedly his AH has improved minimally with current medication. He requested a medication change because he knows a guy named "Hal" and he doesn't want to be on haldol because of coincident. Unclear if this is psychosis vs delusion? Will not make med changes at this time and re-evaluate. Continued cogentin 0.5 mg q12h Continued haldol 5 mg q12h  Continued propranolol 10 mg q8h   DISCONTINUED invega 6 mg PO on admission - opted for higher potency DISCONTINUED risperdal 1 mg BID to 1 mg qAM + 2 mg qPM - per patient's request   Tobacco use d/o  NRTs Encouraged cessation    Cannabis use d/o UDS positive. Encouraged cessation               Stimulant misuse d/o  Reported adherent to adderall for years and reported still taking it, however UDS negative for amphetamines. Possible diversion, maybe he didn't take it 24h prior to UDS. Held home adderall 20mg  + 10mg  daily   3. Medical Issues Being Addressed:  NA   4. Routine and other pertinent labs:   BMI:29.7  CBC Hb 12.2, CMP 139, lipid wnl, a1c wnl, tsh wnl UDS+ marijuana Qtc 448 5. Disposition Planning:  Tentative Date: 6/18 Barrier: psychosis Location: TBA   Treatment Plan Summary: I certify that inpatient services furnished can reasonably be expected to improve the patient's condition.   Daily contact with patient to assess and evaluate symptoms and progress in treatment and Medication management Daily contact with patient to assess and evaluate symptoms and progress in treatment Patient's case to be discussed in multi-disciplinary team  meeting Observation Level: q15 minute checks  Vital signs: q12 hours Precautions: suicide, elopement, and assault The risks/benefits/side-effects/alternatives to this medication were discussed in detail with the patient and time was given for questions. The patient consents to medication trial. The patient consents to medication trial. FDA black box warnings, if present, were discussed. Metabolic profile and EKG monitoring obtained while on an atypical antipsychotic  Encouraged patient to participate in unit milieu and in scheduled group therapies   Total Time spent with patient:  Patient's case was discussed with Attending, see attestation for more information.   Signed: Princess Bruins, DO Psychiatry Resident, PGY-2 [facility] - Adult  [address] [place], [place] [address] Ph: [phone] Fax: [phone]

## 2023-02-19 NOTE — Progress Notes (Signed)
   02/18/23 2010  Psych Admission Type (Psych Patients Only)  Admission Status Voluntary  Psychosocial Assessment  Patient Complaints Anxiety  Eye Contact Fair  Facial Expression Flat  Affect Sad  Speech Logical/coherent  Interaction Minimal  Motor Activity Slow  Appearance/Hygiene Unremarkable  Behavior Characteristics Appropriate to situation;Cooperative  Mood Sad  Thought Process  Coherency WDL  Content Paranoia  Delusions Paranoid  Perception Hallucinations  Hallucination Auditory  Judgment Impaired  Confusion None  Danger to Self  Current suicidal ideation? Denies  Agreement Not to Harm Self Yes  Description of Agreement Verbally contracted for safety  Danger to Others  Danger to Others None reported or observed

## 2023-02-19 NOTE — [facility] Group Notes (Addendum)
[facility] Group Notes:  (Nursing/MHT/Case Management/Adjunct)  Date:  02/19/2023  Time:0900  Type of Therapy:  Psychoeducational Skills/Daily Goals Group  Participation Level:  did not attend  Participation Quality:    Affect:    Cognitive:    Insight:  Engagement in Group:    Modes of Intervention:  discussion  Summary of Progress/Problems: Pts were asked to emotionally check in and share about how they were feeling. Community meeting unit ward rules were also discussed. Pt did not attend.  Malva Limes 02/19/2023, 11:00 AM

## 2023-02-19 NOTE — Group Note (Signed)
[facility] [name] Group Therapy Note  02/19/2023  10:00-11:00AM  Type of Therapy and Topic:  Group Therapy:  Adding Supports Including Being Your Own Support  Participation Level:  Active   Description of Group:  Patients in this group were introduced to the concept that additional supports including self-support are an essential part of recovery.  After a discussion about the differences between healthy supports and unheathy supports, a song entitled "My Own Hero" was played.  A group discussion ensued in which patients stated they could relate to the song and it inspired them to realize they have be willing to help themselves in order to succeed, because other people cannot achieve sobriety or stability for them.  We discussed adding a variety of healthy supports to address the various needs in our lives.  We also talked about how to put up necessary boundaries to limit unhealthy supports from harming [place].  Therapeutic Goals: 1)  demonstrate the importance of being a part of one's own support system 2)  discuss reasons people in one's life may eventually be unable to be continually supportive  3)  identify the patient's current support system and   4)  elicit commitments to add healthy supports and to become more conscious of being self-supportive   Summary of Patient Progress:  The patient expressed his  healthy support(s) right now include family while unhealthy supports include also his family.  The patient's overall reaction to this topic was positive but he left group early.  Therapeutic Modalities:   Motivational Interviewing Activity  Lynnell Chad

## 2023-02-19 NOTE — [facility] Group Notes (Signed)
Adult Psychoeducational Group Note  Date:  02/19/2023 Time:  11:02 PM  Group Topic/Focus:  Wrap-Up Group:   The focus of this group is to help patients review their daily goal of treatment and discuss progress on daily workbooks.  Participation Level:  Did Not Attend  Participation Quality:   Did Not Attend  Affect:   Did Not Attend  Cognitive:   Did Not Attend  Insight: None  Engagement in Group:   Did Not Attend  Modes of Intervention:   Did Not Attend  Additional Comments:  Pt did not attend group.  Maura Crandall Cassandra 02/19/2023, 11:02 PM

## 2023-02-19 NOTE — Plan of Care (Signed)
  Problem: Education: Goal: Emotional status will improve Outcome: Progressing Goal: Mental status will improve Outcome: Progressing Goal: Verbalization of understanding the information provided will improve Outcome: Progressing   Problem: Activity: Goal: Interest or engagement in activities will improve Outcome: Progressing Goal: Sleeping patterns will improve Outcome: Progressing   Problem: Coping: Goal: Ability to verbalize frustrations and anger appropriately will improve Outcome: Progressing Goal: Ability to demonstrate self-control will improve Outcome: Progressing   Problem: Health Behavior/Discharge Planning: Goal: Identification of resources available to assist in meeting health care needs will improve Outcome: Progressing Goal: Compliance with treatment plan for underlying cause of condition will improve Outcome: Progressing   Problem: Physical Regulation: Goal: Ability to maintain clinical measurements within normal limits will improve Outcome: Progressing   Problem: Safety: Goal: Periods of time without injury will increase Outcome: Progressing   

## 2023-02-19 NOTE — Progress Notes (Signed)
D: Patient is alert, oriented, and cooperative. Endorses AH, reports they are improving some. Denies SI, HI, VH, and verbally contracts for safety. Patient reports he slept good last night with sleeping medication. Patient reports his appetite as good, energy level as low, and concentration as good. Patient rates his depression 0/10, hopelessness 0/10, and anxiety 0/10. Patient denies physical symptoms/pain.    A: Scheduled medications administered per MD order. Support provided. Patient educated on safety on the unit and medications. Routine safety checks every 15 minutes. Patient stated understanding to tell nurse about any new physical symptoms. Patient understands to tell staff of any needs.     R: No adverse drug reactions noted. Patient verbally contracts for safety. Patient remains safe at this time and will continue to monitor.    02/19/23 1000  Psych Admission Type (Psych Patients Only)  Admission Status Voluntary  Psychosocial Assessment  Patient Complaints Anxiety  Eye Contact Fair  Facial Expression Flat  Affect Sad  Speech Logical/coherent  Interaction Minimal  Motor Activity Slow  Appearance/Hygiene Unremarkable  Behavior Characteristics Appropriate to situation;Cooperative  Mood Sad  Thought Process  Coherency WDL  Content Paranoia  Delusions None reported or observed  Perception Hallucinations  Hallucination Auditory  Judgment Impaired  Confusion None  Danger to Self  Current suicidal ideation? Denies  Agreement Not to Harm Self Yes  Description of Agreement verbal  Danger to Others  Danger to Others None reported or observed

## 2023-02-20 ENCOUNTER — Encounter (HOSPITAL_COMMUNITY): Payer: Self-pay

## 2023-02-20 DIAGNOSIS — F2081 Schizophreniform disorder: Secondary | ICD-10-CM | POA: Diagnosis not present

## 2023-02-20 MED ORDER — HALOPERIDOL 5 MG PO TABS
5.0000 mg | ORAL_TABLET | Freq: Three times a day (TID) | ORAL | Status: DC
  Administered 2023-02-20 – 2023-02-22 (×6): 5 mg via ORAL
  Filled 2023-02-20 (×12): qty 1

## 2023-02-20 MED ORDER — GABAPENTIN 100 MG PO CAPS
100.0000 mg | ORAL_CAPSULE | Freq: Three times a day (TID) | ORAL | Status: DC
  Administered 2023-02-20 – 2023-02-21 (×3): 100 mg via ORAL
  Filled 2023-02-20 (×9): qty 1

## 2023-02-20 NOTE — Progress Notes (Signed)
[facility] MD Progress Note  Date: 02/20/2023 12:54 PM Name: William Romero  DOB: [birth year] MRN: [id number] Unit: [id number]  CC: psychosis  REASON FOR ADMISSION   William Romero is a 42 y.o. male with PMH of MDD, ADHD and schizophrenia per chart, who presented voluntary to [facility] (02/13/2023), then admitted to [facility] Voluntary for AH and paranoia x3-4 months   Principal Problem: Schizophreniform psychosis (HCC) Diagnosis: Principal Problem:   Schizophreniform psychosis (HCC) Active Problems:   Tobacco use disorder   Cannabis use disorder  CHART REVIEW FROM LAST 24 HOURS   Adherent to scheduled meds: yes  Agitation PRNs: Hydroxyzine 25 mg at night Per nursing staff: no acute behavioral concerns Groups: yes, engaged Documented sleep last 24 hours: 12.75   Yesterday, the psychiatry team made the following recommendations:  Continued cogentin 0.5 mg q12h Continued haldol 5 mg q12h  Continued propranolol 10 mg q8h  SUBJECTIVE  Patient was seen and evaluated.  He was in his room sitting on the bed and in no acute distress.  He maintained fair eye contact.  He appears somewhat guarded and hypervigilant.  Overall is less anxious but not very forthcoming with information.  He reports that there has been some improvement in his hallucinations although they are still present.  The voices tend to be somewhat derogatory.  He claimed that he would want to be discharged so and he will figure something out as soon as his car is fixed.  He has no specific plan as to what he will do or where he will stay.  He claims that he is going to take his car and he will figure something out.  Today he reports all symptoms to be 0 out of/10.He remains delusional, continues to actively hallucinating.  He remains ambivalent about his medications.  However he appears to be taking them. He has been sleeping fairly well.  His appetite remains stable. Continues to endorse AH, possibly thought  withdrawal. Denied SI/HI/VH, paranoia or other first rank symptoms.  Review of Systems  Constitutional:  Negative for malaise/fatigue.  Respiratory:  Negative for shortness of breath.   Cardiovascular:  Negative for chest pain.  Gastrointestinal:  Negative for abdominal pain, constipation, diarrhea, nausea and vomiting.  Neurological:  Negative for dizziness and headaches.   HISTORY  Past Psychiatric History:  Diagnoses: MDD, ADHD and schizophrenia Inpatient treatment: ~6 between 2012-2019 Suicide: Denied Homicide: Denied Medication history: Unknown Medication compliance: Poor, except to adderall   Past Medical/Surgical History:  Medical Diagnoses:NA Prior Hosp: NA Prior Surgeries/Trauma: NA Concussions/Head Trauma/LOC:Denied Seizures: Denied PCP: Philip Aspen, Limmie Patricia, MD  Allergies: Patient has no known allergies.    Family Psychiatric History:  Denied    Social History:  Housing: unhoused Finances: unemployed Children: NA Guns/Weapons: Denied Legal: jail - reported for self defense   Substance Use History: Alcohol: last drink was ~5 d prior to admission, 2-3 beers. Prior to that ~3 weeks prior was 1 pint of whiskey  Nicotine:  reports that he has quit smoking. His smoking use included cigarettes. He smoked an average of .5 packs per day. He has never used smokeless tobacco. Marijuana: Yes IV drug use: Denied Stimulants: Possible stimulant misuse  Opiates: Denied Sedative/hypnotics: Denied Hallucinogens: Denied H/O withdrawals, blackouts: Denied H/O DT: Denied H/O Detox / Rehab: Denied DUI/DWI: Denied  Past Medical History:  Past Medical History:  Diagnosis Date   Schizophrenia (HCC)     Past Surgical History:  Procedure Laterality Date   APPENDECTOMY  Family History:  History reviewed. No pertinent family history. Social History:  Social History   Substance and Sexual Activity  Alcohol Use Not Currently   Alcohol/week: 5.0 standard drinks  of alcohol   Types: 5 Cans of beer per week     Social History   Substance and Sexual Activity  Drug Use No    Social History   Socioeconomic History   Marital status: Single    Spouse name: Not on file   Number of children: Not on file   Years of education: Not on file   Highest education level: Not on file  Occupational History   Not on file  Tobacco Use   Smoking status: Former    Packs/day: .5    Types: Cigarettes   Smokeless tobacco: Never  Vaping Use   Vaping Use: Never used  Substance and Sexual Activity   Alcohol use: Not Currently    Alcohol/week: 5.0 standard drinks of alcohol    Types: 5 Cans of beer per week   Drug use: No   Sexual activity: Not on file  Other Topics Concern   Not on file  Social History Narrative   Not on file   Social Determinants of Health   Financial Resource Strain: Not on file  Food Insecurity: No Food Insecurity (02/14/2023)   Hunger Vital Sign    Worried About Running Out of Food in the Last Year: Never true    Ran Out of Food in the Last Year: Never true  Transportation Needs: No Transportation Needs (02/14/2023)   PRAPARE - Administrator, Civil Service (Medical): No    Lack of Transportation (Non-Medical): No  Physical Activity: Not on file  Stress: Not on file  Social Connections: Not on file   Additional Social History:                         Current Medications: Current Facility-Administered Medications  Medication Dose Route Frequency Provider Last Rate Last Admin   acetaminophen (TYLENOL) tablet 650 mg  650 mg Oral Q6H PRN Ardis Hughs, NP       alum & mag hydroxide-simeth (MAALOX/MYLANTA) 200-200-20 MG/5ML suspension 30 mL  30 mL Oral Q4H PRN Ardis Hughs, NP       benztropine (COGENTIN) tablet 0.5 mg  0.5 mg Oral BID Princess Bruins, DO   0.5 mg at 02/20/23 1610   diphenhydrAMINE (BENADRYL) capsule 50 mg  50 mg Oral TID PRN Ardis Hughs, NP       Or   diphenhydrAMINE  (BENADRYL) injection 50 mg  50 mg Intramuscular TID PRN Ardis Hughs, NP       haloperidol (HALDOL) tablet 5 mg  5 mg Oral TID PRN Ardis Hughs, NP       Or   haloperidol lactate (HALDOL) injection 5 mg  5 mg Intramuscular TID PRN Ardis Hughs, NP       haloperidol (HALDOL) tablet 5 mg  5 mg Oral Q12H Massengill, Harrold Donath, MD   5 mg at 02/20/23 0839   hydrOXYzine (ATARAX) tablet 25 mg  25 mg Oral TID PRN Ardis Hughs, NP   25 mg at 02/20/23 1005   LORazepam (ATIVAN) tablet 2 mg  2 mg Oral TID PRN Ardis Hughs, NP       Or   LORazepam (ATIVAN) injection 2 mg  2 mg Intramuscular TID PRN Ardis Hughs, NP  magnesium hydroxide (MILK OF MAGNESIA) suspension 30 mL  30 mL Oral Daily PRN Ardis Hughs, NP       propranolol (INDERAL) tablet 10 mg  10 mg Oral Q8H Princess Bruins, DO   10 mg at 02/20/23 4098   traZODone (DESYREL) tablet 50 mg  50 mg Oral QHS PRN Ardis Hughs, NP   50 mg at 02/19/23 2035    OBJECTIVE  BP 110/84   Pulse 97   Temp 97.7 F (36.5 C) (Oral)   Resp 16   Ht 6' (1.829 m)   Wt 99.3 kg   SpO2 98%   BMI 29.70 kg/m   Physical Exam Physical Exam Vitals and nursing note reviewed.  Constitutional:      General: He is not in acute distress.    Appearance: He is not ill-appearing, toxic-appearing or diaphoretic.  HENT:     Head: Normocephalic.  Pulmonary:     Effort: Pulmonary effort is normal. No respiratory distress.  Neurological:     Mental Status: He is alert.     AIMS:   ,  ,  ,  ,      Psychiatric Specialty Exam: Presentation General Appearance: Casual; Disheveled  Eye Contact: Fair  Speech: Clear and Coherent  Volume: Decreased  Handedness:Right   Mood and Affect  Mood: Anxious  Affect: Constricted   Thought Process  Thought Process: Coherent  Descriptions of Associations: Tangential   Thought Content Suicidal Thoughts:No  Homicidal Thoughts:No  Hallucinations:Auditory continues to  hear voices of females and males having converstation  Ideas of Reference:Paranoia  Thought Content:Perseveration; Rumination   Sensorium  Memory:Immediate Fair; Remote Fair; Recent Fair  Judgment:Fair  Insight:Fair   Executive Functions  Orientation:Full (Time, Place and Person)  Language:Fair  Concentration:Fair  Attention:Fair  Recall:Fair  Fund of Knowledge:Fair   Psychomotor Activity  Psychomotor Activity:Normal  Assets  Assets:Communication Skills; Desire for Improvement  Sleep  Quality:Fair  Documented sleep last 24 hours: 12.75   Lab Results:  No results found for this or any previous visit (from the past 48 hour(s)).  Blood Alcohol level:  Lab Results  Component Value Date   ETH <10 02/13/2023   ETH <10 12/02/2017    Metabolic Disorder Labs: Lab Results  Component Value Date   HGBA1C 5.0 02/13/2023   MPG 96.8 02/13/2023   No results found for: "PROLACTIN" Lab Results  Component Value Date   CHOL 150 02/13/2023   TRIG 100 02/13/2023   HDL 53 02/13/2023   CHOLHDL 2.8 02/13/2023   VLDL 20 02/13/2023   LDLCALC 77 02/13/2023   ASSESSMENT / PLAN  Diagnoses / Active Problems: Principal Problem:   Schizophreniform psychosis (HCC) Active Problems:   Tobacco use disorder   Cannabis use disorder  Safety and Monitoring: Voluntary admission to inpatient psychiatric unit for safety, stabilization and treatment    2. Psychiatric Diagnoses and Treatment:  Schizophreniform Presented with psychosis x3-52mo, unclear etiology. Broad ddx, including substance induced, acute stress given his years of psychiatric stability (chart shows regular visit to PCP for ADHD and only few recent ED visits over the past year). He was started on invega PO by prior provider however will switch to more potent rx, risperdal Propranolol appears to be helping, he appears less anxious, did not need as much as needed Vistaril yesterday.  Also appeared less guarded on exam.   Reportedly his AH has improved minimally with current medication. He requested a medication change because he knows a guy named "Hal" and he  doesn't want to be on haldol because of coincident. Unclear if this is psychosis vs delusion? Will not make med changes at this time and re-evaluate. Continued cogentin 0.5 mg q12h we will increase as tolerated to 5 mg 3 times daily. Continued haldol 5 mg q12h  Continued propranolol 10 mg q8h   DISCONTINUED invega 6 mg PO on admission - opted for higher potency DISCONTINUED risperdal 1 mg BID to 1 mg qAM + 2 mg qPM - per patient's request   Tobacco use d/o  NRTs Encouraged cessation    Cannabis use d/o UDS positive. Encouraged cessation               Stimulant misuse d/o  Reported adherent to adderall for years and reported still taking it, however UDS negative for amphetamines. Possible diversion, maybe he didn't take it 24h prior to UDS. Held home adderall 20mg  + 10mg  daily   3. Medical Issues Being Addressed:  NA   4. Routine and other pertinent labs:   BMI:29.7  CBC Hb 12.2, CMP 139, lipid wnl, a1c wnl, tsh wnl UDS+ marijuana Qtc 448 5. Disposition Planning:  Tentative Date: 6/18 or 02/22/2019 Barrier: psychosis Location: TBA   Treatment Plan Summary: I certify that inpatient services furnished can reasonably be expected to improve the patient's condition.   Daily contact with patient to assess and evaluate symptoms and progress in treatment and Medication management Daily contact with patient to assess and evaluate symptoms and progress in treatment Patient's case to be discussed in multi-disciplinary team meeting Observation Level: q15 minute checks  Vital signs: q12 hours Precautions: suicide, elopement, and assault The risks/benefits/side-effects/alternatives to this medication were discussed in detail with the patient and time was given for questions. The patient consents to medication trial. The patient consents to medication  trial. FDA black box warnings, if present, were discussed. Metabolic profile and EKG monitoring obtained while on an atypical antipsychotic  Encouraged patient to participate in unit milieu and in scheduled group therapies   Total Time spent with patient:  Patient's case was discussed with Attending, see attestation for more information.   Signed: Rex Kras, MD [facility] - [facility]  [address] [place], [place] [address] Ph: [phone] Fax: [phone] Patient ID: Deanne Coffer, male   DOB: [birth year], 42 y.o.   MRN: [id number]

## 2023-02-20 NOTE — BH IP Treatment Plan (Signed)
Interdisciplinary Treatment and Diagnostic Plan Update  02/20/2023 Time of Session: 9:40 AM ( UPDATE )  William Romero MRN: [id number]  Principal Diagnosis: Schizophreniform psychosis (HCC)  Secondary Diagnoses: Principal Problem:   Schizophreniform psychosis (HCC) Active Problems:   Tobacco use disorder   Cannabis use disorder   Current Medications:  Current Facility-Administered Medications  Medication Dose Route Frequency Provider Last Rate Last Admin   acetaminophen (TYLENOL) tablet 650 mg  650 mg Oral Q6H PRN Ardis Hughs, NP       alum & mag hydroxide-simeth (MAALOX/MYLANTA) 200-200-20 MG/5ML suspension 30 mL  30 mL Oral Q4H PRN Ardis Hughs, NP       benztropine (COGENTIN) tablet 0.5 mg  0.5 mg Oral BID Princess Bruins, DO   0.5 mg at 02/20/23 1650   diphenhydrAMINE (BENADRYL) capsule 50 mg  50 mg Oral TID PRN Ardis Hughs, NP       Or   diphenhydrAMINE (BENADRYL) injection 50 mg  50 mg Intramuscular TID PRN Ardis Hughs, NP       gabapentin (NEURONTIN) capsule 100 mg  100 mg Oral TID Rex Kras, MD   100 mg at 02/20/23 1538   haloperidol (HALDOL) tablet 5 mg  5 mg Oral TID PRN Ardis Hughs, NP       Or   haloperidol lactate (HALDOL) injection 5 mg  5 mg Intramuscular TID PRN Ardis Hughs, NP       haloperidol (HALDOL) tablet 5 mg  5 mg Oral TID Rex Kras, MD   5 mg at 02/20/23 1650   hydrOXYzine (ATARAX) tablet 25 mg  25 mg Oral TID PRN Ardis Hughs, NP   25 mg at 02/20/23 1005   LORazepam (ATIVAN) tablet 2 mg  2 mg Oral TID PRN Ardis Hughs, NP       Or   LORazepam (ATIVAN) injection 2 mg  2 mg Intramuscular TID PRN Ardis Hughs, NP       magnesium hydroxide (MILK OF MAGNESIA) suspension 30 mL  30 mL Oral Daily PRN Ardis Hughs, NP       propranolol (INDERAL) tablet 10 mg  10 mg Oral Q8H Princess Bruins, DO   10 mg at 02/20/23 1349   traZODone (DESYREL) tablet 50 mg  50 mg Oral QHS PRN Ardis Hughs, NP   50 mg at 02/19/23 2035   PTA Medications: Medications Prior to Admission  Medication Sig Dispense Refill Last Dose   amphetamine-dextroamphetamine (ADDERALL) 20 MG tablet Take one tab in the morning and half tab in the evening (Patient taking differently: Take 10-20 mg by mouth 2 (two) times daily. Take one tablet in the morning and half a tablet after lunch.) 45 tablet 0    paliperidone (INVEGA) 6 MG 24 hr tablet Take 1 tablet (6 mg total) by mouth daily.       Patient Stressors: Financial difficulties    Patient Strengths: Careers information officer for treatment/growth   Treatment Modalities: Medication Management, Group therapy, Case management,  1 to 1 session with clinician, Psychoeducation, Recreational therapy.   Physician Treatment Plan for Primary Diagnosis: Schizophreniform psychosis (HCC) Long Term Goal(s):     Short Term Goals:    Medication Management: Evaluate patient's response, side effects, and tolerance of medication regimen.  Therapeutic Interventions: 1 to 1 sessions, Unit Group sessions and Medication administration.  Evaluation of Outcomes: Progressing  Physician Treatment Plan for Secondary Diagnosis: Principal  Problem:   Schizophreniform psychosis (HCC) Active Problems:   Tobacco use disorder   Cannabis use disorder  Long Term Goal(s):     Short Term Goals:       Medication Management: Evaluate patient's response, side effects, and tolerance of medication regimen.  Therapeutic Interventions: 1 to 1 sessions, Unit Group sessions and Medication administration.  Evaluation of Outcomes: Progressing   RN Treatment Plan for Primary Diagnosis: Schizophreniform psychosis (HCC) Long Term Goal(s): Knowledge of disease and therapeutic regimen to maintain health will improve  Short Term Goals: Ability to remain free from injury will improve, Ability to verbalize frustration and anger appropriately will  improve, Ability to participate in decision making will improve, Ability to verbalize feelings will improve, Ability to identify and develop effective coping behaviors will improve, and Compliance with prescribed medications will improve  Medication Management: RN will administer medications as ordered by provider, will assess and evaluate patient's response and provide education to patient for prescribed medication. RN will report any adverse and/or side effects to prescribing provider.  Therapeutic Interventions: 1 on 1 counseling sessions, Psychoeducation, Medication administration, Evaluate responses to treatment, Monitor vital signs and CBGs as ordered, Perform/monitor CIWA, COWS, AIMS and Fall Risk screenings as ordered, Perform wound care treatments as ordered.  Evaluation of Outcomes: Progressing   [name] Treatment Plan for Primary Diagnosis: Schizophreniform psychosis (HCC) Long Term Goal(s): Safe transition to appropriate next level of care at discharge, Engage patient in therapeutic group addressing interpersonal concerns.  Short Term Goals: Engage patient in aftercare planning with referrals and resources, Increase social support, Increase emotional regulation, Facilitate acceptance of mental health diagnosis and concerns, Identify triggers associated with mental health/substance abuse issues, and Increase skills for wellness and recovery  Therapeutic Interventions: Assess for all discharge needs, 1 to 1 time with Social worker, Explore available resources and support systems, Assess for adequacy in community support network, Educate family and significant other(s) on suicide prevention, Complete Psychosocial Assessment, Interpersonal group therapy.  Evaluation of Outcomes: Progressing     Progress in Treatment: Attending groups: No. Participating in groups: No. Taking medication as prescribed: Yes. Toleration medication: Yes. Family/Significant other contact made: No, will  contact:  Patient declined Safety planning and Doctor was made aware  Patient understands diagnosis: Yes. Discussing patient identified problems/goals with staff: Yes. Medical problems stabilized or resolved: Yes. Denies suicidal/homicidal ideation: Yes. Issues/concerns per patient self-inventory: No.     New problem(s) identified: No, Describe:  None reported    New Short Term/Long Term Goal(s):medication stabilization, elimination of SI thoughts, development of comprehensive mental wellness plan.     Patient Goals:  " Not have the symptoms I was having "    Discharge Plan or Barriers: Patient recently admitted. CSW will continue to follow and assess for appropriate referrals and possible discharge planning.     Reason for Continuation of Hospitalization: Anxiety Depression Hallucinations Medication stabilization   Estimated Length of Stay: 1-2 days   Last 3 [place] Suicide Severity Risk Score: Flowsheet Row [facility] (Current) [facility] from [facility]/13/2023 in [facility] ED from [facility]/08/2023 in [facility] Department [facility]  C-SSRS RISK CATEGORY No Risk No Risk No Risk       Last PHQ 2/9 Scores:    09/26/2022    3:43 PM 06/21/2022    8:20 AM 01/03/2022   11:30 AM  Depression screen PHQ 2/9  Decreased Interest 0 0 0  Down, Depressed,  Hopeless 0 0 0  PHQ - 2 Score 0 0 0  Altered sleeping 0 0 0  Tired, decreased energy 0 0 0  Change in appetite 0 0 0  Feeling bad or failure about yourself  0 0 0  Trouble concentrating 0 0 0  Moving slowly or fidgety/restless 0 0 0  Suicidal thoughts 0 0 0  PHQ-9 Score 0 0 0  Difficult doing work/chores Not difficult at all Not difficult at all Not difficult at all    Scribe for Treatment Team: Beather Arbour 02/20/2023 4:51 PM

## 2023-02-20 NOTE — Group Note (Signed)
Recreation Therapy Group Note   Group Topic:Team Building  Group Date: 02/20/2023 Start Time: 0935 End Time: 1006 Facilitators: Sirus Labrie-McCall, LRT,CTRS Location: [address] Dayroom   Goal Area(s) Addresses:  Patient will effectively work with peer towards shared goal.  Patient will identify skills used to make activity successful.  Patient will identify how skills used during activity can be applied to reach post d/c goals.    Group Description: [employer]. In teams of 5-6, patients were given 11 craft pipe cleaners. Using the materials provided, patients were instructed to compete again the opposing team(s) to build the tallest free-standing structure from floor level. The activity was timed; difficulty increased by Clinical research associate as Production designer, theatre/television/film continued.  Systematically resources were removed with additional directions for example, placing one arm behind their back, working in silence, and shape stipulations. LRT facilitated post-activity discussion reviewing team processes and necessary communication skills involved in completion. Patients were encouraged to reflect how the skills utilized, or not utilized, in this activity can be incorporated to positively impact support systems post discharge.   Affect/Mood: N/A   Participation Level: Did not attend    Clinical Observations/Individualized Feedback:     Plan: Continue to engage patient in RT group sessions 2-3x/week.   Haley Roza-McCall, LRT,CTRS 02/20/2023 12:14 PM

## 2023-02-20 NOTE — Progress Notes (Signed)
   02/20/23 1100  Psych Admission Type (Psych Patients Only)  Admission Status Voluntary  Psychosocial Assessment  Patient Complaints Anxiety  Eye Contact Fair  Facial Expression Anxious  Affect Anxious;Sad  Speech Logical/coherent  Interaction Minimal  Motor Activity Slow  Appearance/Hygiene Unremarkable  Behavior Characteristics Appropriate to situation;Cooperative  Mood Anxious;Sad  Thought Process  Coherency WDL  Content Paranoia  Delusions None reported or observed  Perception Hallucinations  Hallucination Auditory  Judgment Impaired  Confusion None  Danger to Self  Current suicidal ideation? Denies  Agreement Not to Harm Self Yes  Description of Agreement verbal  Danger to Others  Danger to Others None reported or observed

## 2023-02-21 MED ORDER — GABAPENTIN 300 MG PO CAPS
300.0000 mg | ORAL_CAPSULE | Freq: Three times a day (TID) | ORAL | Status: DC
  Administered 2023-02-21 – 2023-02-22 (×3): 300 mg via ORAL
  Filled 2023-02-21 (×5): qty 1
  Filled 2023-02-21 (×3): qty 21
  Filled 2023-02-21 (×2): qty 1

## 2023-02-21 NOTE — Group Note (Signed)
Recreation Therapy Group Note   Group Topic:Animal Assisted Therapy   Group Date: 02/21/2023 Start Time: 0950 End Time: 1030 Facilitators: Adriauna Campton-McCall, LRT,CTRS Location: [address] Dayroom   Animal-Assisted Activity (AAA) Program Checklist/Progress Notes Patient Eligibility Criteria Checklist & Daily Group note for Rec Tx Intervention  AAA/T Program Assumption of Risk Form signed by Patient/ or Parent Legal Guardian Yes  Patient is free of allergies or severe asthma Yes  Patient reports no fear of animals Yes  Patient reports no history of cruelty to animals Yes  Patient understands his/her participation is voluntary Yes  Patient washes hands before animal contact Yes  Patient washes hands after animal contact Yes   Affect/Mood: Appropriate   Participation Level: Engaged   Participation Quality: Independent   Behavior: Appropriate    Clinical Observations/Individualized Feedback: Pt attended and participated in group session.    Plan: Continue to engage patient in RT group sessions 2-3x/week.   William Romero, LRT,CTRS 02/21/2023 1:36 PM

## 2023-02-21 NOTE — Progress Notes (Signed)
   02/21/23 0647  15 Minute Checks  Location Bedroom  Visual Appearance Calm  Behavior Composed  Sleep (Behavioral Health Patients Only)  Calculate sleep? (Click Yes once per 24 hr at 0600 safety check) Yes  Documented sleep last 24 hours 9

## 2023-02-21 NOTE — [facility] Group Notes (Signed)
Psychoeducational Group Note  Date:  02/21/2023 Time:  2000  Group Topic/Focus:  Wrap up group  Participation Level: Did Not Attend  Participation Quality:  Not Applicable  Affect:  Not Applicable  Cognitive:  Not Applicable  Insight:  Not Applicable  Engagement in Group: Not Applicable  Additional Comments:  Did not attend.   Marcille Buffy 02/21/2023, 9:47 PM

## 2023-02-21 NOTE — Progress Notes (Cosign Needed Addendum)
[facility] MD Progress Note  Date: 02/21/2023 3:45 PM Name: William Romero  DOB: 1980-12-21 MRN: [id number] Unit: [id number]  CC: psychosis  REASON FOR ADMISSION   William Romero is a 42 y.o. male with PMH of MDD, ADHD and schizophrenia per chart, who presented voluntary to [facility] (02/13/2023), then admitted to [facility] Voluntary for AH and paranoia x3-4 months   Principal Problem: Schizophreniform psychosis (HCC) Diagnosis: Principal Problem:   Schizophreniform psychosis (HCC) Active Problems:   Tobacco use disorder   Cannabis use disorder  CHART REVIEW FROM LAST 24 HOURS   Adherent to scheduled meds: yes  Agitation PRNs: none Per nursing staff: no acute behavioral concerns Groups: yes, engaged Documented sleep last 24 hours: 9   Yesterday, the psychiatry team made the following recommendations:  Continued cogentin 0.5 mg q12h Continued haldol 5 mg q12h  Continued propranolol 10 mg q8h Started gabapentin 100 mg TID  SUBJECTIVE  Patient was initially seen waiting at the med window. During evaluation, patient was calm, engaged.   His AH has improved some, reported that he thinks they are annoying, but is not overly distressed by them. He denies medication side effects to new gabapentin, however still needed PRN vistaril. He is amenable to increasing gabapentin dose.  He has been sleeping fairly well.  His appetite remains stable. Continues to endorse AH. Denied SI/HI/VH, paranoia or other first rank symptoms.  Review of Systems  Constitutional:  Negative for malaise/fatigue.  Respiratory:  Negative for shortness of breath.   Cardiovascular:  Negative for chest pain.  Gastrointestinal:  Negative for abdominal pain, constipation, diarrhea, nausea and vomiting.  Neurological:  Negative for dizziness and headaches.   HISTORY  Past Psychiatric History:  Diagnoses: MDD, ADHD and schizophrenia Inpatient treatment: ~6 between 2012-2019 Suicide: Denied Homicide:  Denied Medication history: Unknown Medication compliance: Poor, except to adderall   Past Medical/Surgical History:  Medical Diagnoses:NA Prior Hosp: NA Prior Surgeries/Trauma: NA Concussions/Head Trauma/LOC:Denied Seizures: Denied PCP: Philip Aspen, Limmie Patricia, MD  Allergies: Patient has no known allergies.    Family Psychiatric History:  Denied    Social History:  Housing: unhoused Finances: unemployed Children: NA Guns/Weapons: Denied Legal: jail - reported for self defense   Substance Use History: Alcohol: last drink was ~5 d prior to admission, 2-3 beers. Prior to that ~3 weeks prior was 1 pint of whiskey  Nicotine:  reports that he has quit smoking. His smoking use included cigarettes. He smoked an average of .5 packs per day. He has never used smokeless tobacco. Marijuana: Yes IV drug use: Denied Stimulants: Possible stimulant misuse  Opiates: Denied Sedative/hypnotics: Denied Hallucinogens: Denied H/O withdrawals, blackouts: Denied H/O DT: Denied H/O Detox / Rehab: Denied DUI/DWI: Denied  Past Medical History:  Past Medical History:  Diagnosis Date   Schizophrenia (HCC)     Past Surgical History:  Procedure Laterality Date   APPENDECTOMY     Family History:  History reviewed. No pertinent family history. Social History:  Social History   Substance and Sexual Activity  Alcohol Use Not Currently   Alcohol/week: 5.0 standard drinks of alcohol   Types: 5 Cans of beer per week     Social History   Substance and Sexual Activity  Drug Use No    Social History   Socioeconomic History   Marital status: Single    Spouse name: Not on file   Number of children: Not on file   Years of education: Not on file   Highest education  level: Not on file  Occupational History   Not on file  Tobacco Use   Smoking status: Former    Packs/day: .5    Types: Cigarettes   Smokeless tobacco: Never  Vaping Use   Vaping Use: Never used  Substance and Sexual  Activity   Alcohol use: Not Currently    Alcohol/week: 5.0 standard drinks of alcohol    Types: 5 Cans of beer per week   Drug use: No   Sexual activity: Not on file  Other Topics Concern   Not on file  Social History Narrative   Not on file   Social Determinants of Health   Financial Resource Strain: Not on file  Food Insecurity: No Food Insecurity (02/14/2023)   Hunger Vital Sign    Worried About Running Out of Food in the Last Year: Never true    Ran Out of Food in the Last Year: Never true  Transportation Needs: No Transportation Needs (02/14/2023)   PRAPARE - Administrator, Civil Service (Medical): No    Lack of Transportation (Non-Medical): No  Physical Activity: Not on file  Stress: Not on file  Social Connections: Not on file   Additional Social History:                         Current Medications: Current Facility-Administered Medications  Medication Dose Route Frequency Provider Last Rate Last Admin   acetaminophen (TYLENOL) tablet 650 mg  650 mg Oral Q6H PRN Ardis Hughs, NP       alum & mag hydroxide-simeth (MAALOX/MYLANTA) 200-200-20 MG/5ML suspension 30 mL  30 mL Oral Q4H PRN Ardis Hughs, NP       benztropine (COGENTIN) tablet 0.5 mg  0.5 mg Oral BID Princess Bruins, DO   0.5 mg at 02/21/23 0747   diphenhydrAMINE (BENADRYL) capsule 50 mg  50 mg Oral TID PRN Ardis Hughs, NP       Or   diphenhydrAMINE (BENADRYL) injection 50 mg  50 mg Intramuscular TID PRN Ardis Hughs, NP       gabapentin (NEURONTIN) capsule 100 mg  100 mg Oral TID Rex Kras, MD   100 mg at 02/21/23 1157   haloperidol (HALDOL) tablet 5 mg  5 mg Oral TID PRN Ardis Hughs, NP       Or   haloperidol lactate (HALDOL) injection 5 mg  5 mg Intramuscular TID PRN Ardis Hughs, NP       haloperidol (HALDOL) tablet 5 mg  5 mg Oral TID Rex Kras, MD   5 mg at 02/21/23 1157   hydrOXYzine (ATARAX) tablet 25 mg  25 mg Oral TID PRN Ardis Hughs, NP   25 mg at 02/21/23 0958   LORazepam (ATIVAN) tablet 2 mg  2 mg Oral TID PRN Ardis Hughs, NP       Or   LORazepam (ATIVAN) injection 2 mg  2 mg Intramuscular TID PRN Ardis Hughs, NP       magnesium hydroxide (MILK OF MAGNESIA) suspension 30 mL  30 mL Oral Daily PRN Ardis Hughs, NP       propranolol (INDERAL) tablet 10 mg  10 mg Oral Q8H Princess Bruins, DO   10 mg at 02/21/23 1354   traZODone (DESYREL) tablet 50 mg  50 mg Oral QHS PRN Ardis Hughs, NP   50 mg at 02/20/23 2114    OBJECTIVE  BP (!) 140/83 (  BP Location: Right Arm)   Pulse (!) 112   Temp 98.2 F (36.8 C) (Oral)   Resp 18   Ht 6' (1.829 m)   Wt 99.3 kg   SpO2 96%   BMI 29.70 kg/m   Physical Exam Physical Exam Vitals and nursing note reviewed.  Constitutional:      General: He is not in acute distress.    Appearance: He is not ill-appearing, toxic-appearing or diaphoretic.  HENT:     Head: Normocephalic.  Pulmonary:     Effort: Pulmonary effort is normal. No respiratory distress.  Neurological:     Mental Status: He is alert.     AIMS:   ,  ,  ,  ,      Psychiatric Specialty Exam: Presentation General Appearance: Casual; Disheveled  Eye Contact: Fair  Speech: Clear and Coherent  Volume: Decreased  Handedness:Right   Mood and Affect  Mood: Anxious  Affect: Constricted   Thought Process  Thought Process: Coherent  Descriptions of Associations: Tangential   Thought Content Suicidal Thoughts:No  Homicidal Thoughts:No  Hallucinations:Auditory continues to hear voices of females and males having converstation  Ideas of Reference:Paranoia  Thought Content:Perseveration; Rumination   Sensorium  Memory:Immediate Fair; Remote Fair; Recent Fair  Judgment:Fair  Insight:Fair   Executive Functions  Orientation:Full (Time, Place and Person)  Language:Fair  Concentration:Fair  Attention:Fair  Recall:Fair  Fund of  Knowledge:Fair   Psychomotor Activity  Psychomotor Activity:Normal  Assets  Assets:Communication Skills; Desire for Improvement  Sleep  Quality:Fair  Documented sleep last 24 hours: 9   Lab Results:  No results found for this or any previous visit (from the past 48 hour(s)).  Blood Alcohol level:  Lab Results  Component Value Date   ETH <10 02/13/2023   ETH <10 12/02/2017    Metabolic Disorder Labs: Lab Results  Component Value Date   HGBA1C 5.0 02/13/2023   MPG 96.8 02/13/2023   No results found for: "PROLACTIN" Lab Results  Component Value Date   CHOL 150 02/13/2023   TRIG 100 02/13/2023   HDL 53 02/13/2023   CHOLHDL 2.8 02/13/2023   VLDL 20 02/13/2023   LDLCALC 77 02/13/2023   ASSESSMENT / PLAN  Diagnoses / Active Problems: Principal Problem:   Schizophreniform psychosis (HCC) Active Problems:   Tobacco use disorder   Cannabis use disorder  Safety and Monitoring: Voluntary admission to inpatient psychiatric unit for safety, stabilization and treatment    2. Psychiatric Diagnoses and Treatment:  Schizophreniform Presented with psychosis x3-89mo, unclear etiology. Broad ddx, including substance induced, acute stress given his years of psychiatric stability (chart shows regular visit to PCP for ADHD and only few recent ED visits over the past year). He was started on invega PO by prior provider however will switch to more potent rx, risperdal Propranolol appears to be helping, he appears less anxious, did not need as much as needed Vistaril yesterday.  Also appeared less guarded on exam.  Reportedly his AH has improved minimally with current medication. He requested a medication change because he knows a guy named "Hal" and he doesn't want to be on haldol because of coincident. Unclear if this is psychosis vs delusion? Will not make med changes at this time and re-evaluate. Continued cogentin 0.5 mg q12h  Continued haldol 5 mg TID Continued propranolol 10 mg  q8h INCREASED gabapentin 100 mg to 300 mg TID   DISCONTINUED invega 6 mg PO on admission - opted for higher potency DISCONTINUED risperdal 1 mg BID to  1 mg qAM + 2 mg qPM - per patient's request   Tobacco use d/o  NRTs Encouraged cessation    Cannabis use d/o UDS positive. Encouraged cessation               Stimulant misuse d/o  Reported adherent to adderall for years and reported still taking it, however UDS negative for amphetamines. Possible diversion, maybe he didn't take it 24h prior to UDS. Held home adderall 20mg  + 10mg  daily   3. Medical Issues Being Addressed:  NA   4. Routine and other pertinent labs:   BMI:29.7  CBC Hb 12.2, CMP 139, lipid wnl, a1c wnl, tsh wnl UDS+ marijuana Qtc 448 5. Disposition Planning:  Tentative Date: 02/22/2019 Barrier: psychosis Location: shelter  Treatment Plan Summary: I certify that inpatient services furnished can reasonably be expected to improve the patient's condition.   Daily contact with patient to assess and evaluate symptoms and progress in treatment and Medication management Daily contact with patient to assess and evaluate symptoms and progress in treatment Patient's case to be discussed in multi-disciplinary team meeting Observation Level: q15 minute checks  Vital signs: q12 hours Precautions: suicide, elopement, and assault The risks/benefits/side-effects/alternatives to this medication were discussed in detail with the patient and time was given for questions. The patient consents to medication trial. The patient consents to medication trial. FDA black box warnings, if present, were discussed. Metabolic profile and EKG monitoring obtained while on an atypical antipsychotic  Encouraged patient to participate in unit milieu and in scheduled group therapies   Total Time spent with patient:  Patient's case was discussed with Attending, see attestation for more information.   Signed: Princess Bruins, DO PGY-2 [facility] -  Adult  [address] [place], [place] [address] Ph: [phone] Fax: [phone] Patient ID: William Romero, male   DOB: Aug 27, 1981, 42 y.o.   MRN: [id number]

## 2023-02-21 NOTE — Progress Notes (Signed)
   02/21/23 0900  Psych Admission Type (Psych Patients Only)  Admission Status Voluntary  Psychosocial Assessment  Patient Complaints Anxiety  Eye Contact Fair  Facial Expression Anxious  Affect Anxious  Speech Logical/coherent  Interaction Assertive  Motor Activity Slow  Appearance/Hygiene Unremarkable  Behavior Characteristics Appropriate to situation  Mood Anxious  Thought Process  Coherency WDL  Content Paranoia  Delusions None reported or observed  Perception WDL  Hallucination None reported or observed  Judgment Impaired  Confusion None  Danger to Self  Current suicidal ideation? Denies  Agreement Not to Harm Self Yes  Description of Agreement verbal  Danger to Others  Danger to Others None reported or observed

## 2023-02-21 NOTE — Group Note (Signed)
Date:  02/21/2023 Time:  12:07 PM  Group Topic/Focus:  Rediscovering Joy:   The focus of this group is to explore various ways to relieve stress in a positive manner.    Participation Level:  Did Not Attend  Participation Quality:    Affect:    Cognitive:    Insight:   Engagement in Group:    Modes of Intervention:    Additional Comments:    [name] William Romero 02/21/2023, 12:07 PM

## 2023-02-22 MED ORDER — TRAZODONE HCL 50 MG PO TABS: 50.0000 mg | ORAL_TABLET | Freq: Every evening | ORAL | 0 refills | Status: DC | PRN

## 2023-02-22 MED ORDER — HYDROXYZINE HCL 25 MG PO TABS: 25.0000 mg | ORAL_TABLET | Freq: Three times a day (TID) | ORAL | 0 refills | Status: DC | PRN

## 2023-02-22 MED ORDER — HALOPERIDOL 5 MG PO TABS: 5.0000 mg | ORAL_TABLET | Freq: Three times a day (TID) | ORAL | 0 refills | Status: DC

## 2023-02-22 MED ORDER — BENZTROPINE MESYLATE 0.5 MG PO TABS: 0.5000 mg | ORAL_TABLET | Freq: Two times a day (BID) | ORAL | 0 refills | Status: DC

## 2023-02-22 MED ORDER — PROPRANOLOL HCL 10 MG PO TABS: 10.0000 mg | ORAL_TABLET | Freq: Three times a day (TID) | ORAL | 0 refills | Status: DC

## 2023-02-22 MED ORDER — GABAPENTIN 300 MG PO CAPS: 300.0000 mg | ORAL_CAPSULE | Freq: Three times a day (TID) | ORAL | 0 refills | Status: DC

## 2023-02-22 NOTE — Progress Notes (Signed)
  [facility] Adult Case Management Discharge Plan :  Will you be returning to the same living situation after discharge:  Yes,  pt reported that he will call friends and he will have a place to stay, " I will not have a problem with a living situation" At discharge, do you have transportation home?: Yes,  pt will call an [employer] for transport. Do you have the ability to pay for your medications: Yes,  pt will purchase medications.  Release of information consent forms completed and in the chart;  Patient's signature needed at discharge.  Patient to Follow up at:  Follow-up Information     [facility]. Go on 02/27/2023.   Specialty: Behavioral Health Why: You have an appointment for medication management services on 02/27/23 at 2:30 pm, in person.   You also have an appointment for therapy services on on 02/28/23 at 10:00 am, this will be a Virtual appt. Contact information: [address] [place] [address] [phone]                Next level of care provider has access to [facility] Link: no  Safety Planning and Suicide Prevention discussed: No. Pt declined.     Has patient been referred to the Quitline?: Patient does not use tobacco/nicotine products  Patient has been referred for addiction treatment: Patient refused referral for treatment; referral information given to patient at discharge.  Ariza Evans, Candace Cruise, [name] 02/22/2023, 12:10 PM

## 2023-02-22 NOTE — Progress Notes (Signed)
   02/21/23 2340  Psych Admission Type (Psych Patients Only)  Admission Status Voluntary  Psychosocial Assessment  Patient Complaints Anxiety;Sleep disturbance  Eye Contact Fair  Facial Expression Anxious  Affect Anxious  Speech Logical/coherent  Interaction Assertive  Motor Activity Slow  Appearance/Hygiene Unremarkable  Behavior Characteristics Cooperative  Mood Depressed;Anxious  Thought Process  Coherency WDL  Content Paranoia  Delusions WDL  Perception WDL  Hallucination None reported or observed  Judgment Limited  Confusion WDL  Danger to Self  Current suicidal ideation? Denies  Danger to Others  Danger to Others None reported or observed

## 2023-02-22 NOTE — Discharge Summary (Signed)
Physician Discharge Summary Note  Patient Identification: William Romero, 42 y.o. male  MRN: [id number] DOB: 03-28-1981  Date of Evaluation: 02/22/2023, 10:05 AM Bed: 0404/0404-01 Patient phone: [phone] (home)  Patient address:   [address] [facility] [address]  Date of Admission: 02/14/2023 Date of Discharge: 02/22/2023  Reason for Admission:   William Romero is a 42 y.o. male with PMH of MDD, ADHD and schizophrenia per chart, who presented voluntary to [facility] (02/13/2023), then admitted to [facility] Voluntary for AH and paranoia x3-4 months   PTA/Home Rx: adderall PCP: William Romero, William Patricia, MD  Past Psychiatric History:  Diagnoses: MDD, ADHD and schizophrenia Inpatient treatment: ~6 between 2012-2019 Suicide: Denied Homicide: Denied Medication history: Unknown Medication compliance: Poor, except to adderall   Past Medical/Surgical History:  Medical Diagnoses:NA Prior Hosp: NA Prior Surgeries/Trauma: NA Concussions/Head Trauma/LOC:Denied Seizures: Denied PCP: William Romero, William Patricia, MD  Allergies: Patient has no known allergies.    Family Psychiatric History:  Denied    Social History:  Housing: unhoused Finances: unemployed Children: NA Guns/Weapons: Denied Legal: jail - reported for self defense   Substance Use History: Alcohol: last drink was ~5 d prior to admission, 2-3 beers. Prior to that ~3 weeks prior was 1 pint of whiskey  Nicotine:  reports that he has quit smoking. His smoking use included cigarettes. He smoked an average of .5 packs per day. He has never used smokeless tobacco. Marijuana: Yes IV drug use: Denied Stimulants: Possible stimulant misuse  Opiates: Denied Sedative/hypnotics: Denied Hallucinogens: Denied H/O withdrawals, blackouts: Denied H/O DT: Denied H/O Detox / Rehab: Denied DUI/DWI: Denied  Principal Problem: Schizophreniform psychosis (HCC) Discharge Diagnoses: Principal Problem:    Schizophreniform psychosis (HCC) Active Problems:   Tobacco use disorder   Cannabis use disorder   Past Psychiatric History: Per above Past Medical History:  Past Medical History:  Diagnosis Date   Schizophrenia (HCC)     Past Surgical History:  Procedure Laterality Date   APPENDECTOMY     Family History: History reviewed. No pertinent family history. Family Psychiatric  History: Per above Social History:  Social History   Substance and Sexual Activity  Alcohol Use Not Currently   Alcohol/week: 5.0 standard drinks of alcohol   Types: 5 Cans of beer per week    Social History   Substance and Sexual Activity  Drug Use No    Social History   Socioeconomic History   Marital status: Single    Spouse name: Not on file   Number of children: Not on file   Years of education: Not on file   Highest education level: Not on file  Occupational History   Not on file  Tobacco Use   Smoking status: Former    Packs/day: .5    Types: Cigarettes   Smokeless tobacco: Never  Vaping Use   Vaping Use: Never used  Substance and Sexual Activity   Alcohol use: Not Currently    Alcohol/week: 5.0 standard drinks of alcohol    Types: 5 Cans of beer per week   Drug use: No   Sexual activity: Not on file  Other Topics Concern   Not on file  Social History Narrative   Not on file   Social Determinants of Health   Financial Resource Strain: Not on file  Food Insecurity: No Food Insecurity (02/14/2023)   Hunger Vital Sign    Worried About Running Out of Food in the Last Year: Never true    Ran Out  of Food in the Last Year: Never true  Transportation Needs: No Transportation Needs (02/14/2023)   PRAPARE - Administrator, Civil Service (Medical): No    Lack of Transportation (Non-Medical): No  Physical Activity: Not on file  Stress: Not on file  Social Connections: Not on file    Hospital Course:   During the patient's hospitalization, patient had extensive initial  psychiatric evaluation, and follow-up psychiatric evaluations every day.  Psychiatric diagnoses provided upon initial assessment:  Schizophreniform psychosis v substance induced psychosis Tobacco use d/o   Cannabis use d/o       Stimulant misuse d/o   Patient's psychiatric medications were adjusted on admission:  DISCONTINUED previous invega 6 mg PO STARTED risperdal 1 mg BID increased to 1 mg qAM + 2 mg qPM  HOLD home adderall 20 mg qAM + 10 mg in the afternoon   During the hospitalization, other adjustments were made to the patient's psychiatric medication regimen:  Continued cogentin 0.5 mg q12h  Continued haldol 5 mg TID Continued propranolol 10 mg q8h INCREASED gabapentin 100 mg to 300 mg TID  DISCONTINUED invega 6 mg PO on admission - opted for higher potency DISCONTINUED risperdal 1 mg BID to 1 mg qAM + 2 mg qPM - per patient's request   During the hospitalization lab/imaging obtained: BMI:29.7  CBC Hb 12.2, CMP 139, lipid wnl, a1c wnl, tsh wnl UDS+ marijuana Qtc 448  Patient's care was discussed during the interdisciplinary team meeting every day during the hospitalization.  Patient's side effects to prescribed psychiatric medications: NA  Assessment  Gradually, patient started adjusting to milieu. The patient was evaluated each day by a clinical provider to ascertain response to treatment. Improvement was noted by the patient's report of decreasing symptoms, improved sleep and appetite, affect, medication tolerance, behavior, and participation in unit programming.  Patient was asked each day to complete a self inventory noting mood, mental status, pain, new symptoms, anxiety and concerns.    Symptoms were reported as significantly decreased or resolved completely by discharge.   On day of discharge, the patient reports that their mood is stable. The patient denied having suicidal thoughts for more than 48 hours prior to discharge.  Patient denies having homicidal  thoughts.  Patient denies having auditory hallucinations.  Patient denies any visual hallucinations or other symptoms of psychosis. The patient was motivated to continue taking medication with a goal of continued improvement in mental health.   The patient reports their target psychiatric symptoms of auditory hallucinations responded well to the psychiatric medications, and the patient reports overall benefit other psychiatric hospitalization. Supportive psychotherapy was provided to the patient. The patient also participated in regular group therapy while hospitalized. Coping skills, problem solving as well as relaxation therapies were also part of the unit programming.  Labs were reviewed with the patient, and abnormal results were discussed with the patient.  The patient is able to verbalize their individual safety plan to this provider.  Behavioral Events: NA  Restraints: NA  Groups: Engaged, appropriate  While future psychiatric events cannot be accurately predicted, the patient does not currently require acute inpatient psychiatric care and does not currently meet [facility] involuntary commitment criteria.  # It is recommended to the patient to continue psychiatric medications as prescribed, after discharge from the hospital.    # It is recommended to the patient to follow up with your outpatient psychiatric provider and PCP.  # It was discussed with the patient, the impact of alcohol,  drugs, tobacco have been there overall psychiatric and medical wellbeing, and total abstinence from substance use was recommended to the patient.  # Prescriptions provided or sent directly to preferred pharmacy at discharge. Patient agreeable to plan. Given opportunity to ask questions. Appears to feel comfortable with discharge.    # In the event of worsening symptoms, the patient is instructed to call the crisis hotline, 911 and or go to the nearest ED for appropriate evaluation and treatment of  symptoms. To follow-up with primary care provider for other medical issues, concerns and or health care needs  # Patient was discharged home with a plan to follow up as noted below.  Is patient on multiple antipsychotic therapies at discharge:  No   Has Patient had three or more failed trials of antipsychotic monotherapy by history:  No Recommended Plan for Multiple Antipsychotic Therapies: NA Tobacco Cessation:  N/A, patient does not currently use tobacco products  Physical Findings: BP 115/87 (BP Location: Right Arm)   Pulse 97   Temp (!) 97.5 F (36.4 C) (Oral)   Resp 18   Ht 6' (1.829 m)   Wt 99.3 kg   SpO2 98%   BMI 29.70 kg/m   AIMS: Facial and Oral Movements Muscles of Facial Expression: None, normal Lips and Perioral Area: None, normal Jaw: None, normal Tongue: None, normal,Extremity Movements Upper (arms, wrists, hands, fingers): None, normal Lower (legs, knees, ankles, toes): None, normal, Trunk Movements Neck, shoulders, hips: None, normal, Overall Severity Severity of abnormal movements (highest score from questions above): None, normal Incapacitation due to abnormal movements: None, normal Patient's awareness of abnormal movements (rate only patient's report): No Awareness, Dental Status Current problems with teeth and/or dentures?: No Does patient usually wear dentures?: No    Physical Exam Vitals and nursing note reviewed.  Constitutional:      General: He is not in acute distress.    Appearance: He is not ill-appearing, toxic-appearing or diaphoretic.  HENT:     Head: Normocephalic.  Pulmonary:     Effort: Pulmonary effort is normal. No respiratory distress.  Neurological:     Mental Status: He is alert.    Review of Systems  Respiratory:  Negative for shortness of breath.   Cardiovascular:  Negative for chest pain.  Gastrointestinal:  Negative for nausea and vomiting.  Neurological:  Negative for dizziness and headaches.     Musculoskeletal: Strength & Muscle Tone: within normal limits Gait & Station: normal Patient leans: N/A   Presentation  General Appearance:Appropriate for Environment, Casual, Fairly Groomed Eye Contact:Good Speech:Clear and Coherent, Normal Rate Volume:Normal Handedness:Right  Mood and Affect  Mood:Euthymic Affect:Appropriate, Congruent, Restricted  Thought Process  Thought Process:Coherent, Goal Directed, Linear Descriptions of Associations:Intact  Thought Content Suicidal Thoughts:Suicidal Thoughts: No Homicidal Thoughts:Homicidal Thoughts: No Hallucinations:Hallucinations: None, Auditory (able to reality test and he is not concerned. non-commanding) Ideas of Reference:None Thought Content:Rumination  Sensorium  Memory:Immediate Good Judgment:Fair Insight:Fair  Executive Functions  Orientation:Full (Time, Place and Person) Language:Good Concentration:Good Attention:Good Recall:Good Fund of Knowledge:Good  Psychomotor Activity  Psychomotor Activity:Psychomotor Activity: Normal  Assets  Assets:Communication Skills, Desire for Improvement, Resilience  Sleep  Quality:Good  Social History   Tobacco Use  Smoking Status Former   Packs/day: .5   Types: Cigarettes  Smokeless Tobacco Never    Blood Alcohol level:  Lab Results  Component Value Date   ETH <10 02/13/2023   ETH <10 12/02/2017    Metabolic Disorder Labs:  Lab Results  Component Value Date   HGBA1C  5.0 02/13/2023   MPG 96.8 02/13/2023   No results found for: "PROLACTIN" Lab Results  Component Value Date   CHOL 150 02/13/2023   TRIG 100 02/13/2023   HDL 53 02/13/2023   CHOLHDL 2.8 02/13/2023   VLDL 20 02/13/2023   LDLCALC 77 02/13/2023    Discharge destination: See above  Discharge Instructions     Activity as tolerated - No restrictions   Complete by: As directed    Diet general   Complete by: As directed       Allergies as of 02/22/2023   No Known Allergies       Medication List     STOP taking these medications    amphetamine-dextroamphetamine 20 MG tablet Commonly known as: Adderall   paliperidone 6 MG 24 hr tablet Commonly known as: INVEGA       TAKE these medications      Indication  benztropine 0.5 MG tablet Commonly known as: COGENTIN Take 1 tablet (0.5 mg total) by mouth 2 (two) times daily.  Indication: Extrapyramidal Reaction caused by Medications   gabapentin 300 MG capsule Commonly known as: NEURONTIN Take 1 capsule (300 mg total) by mouth 3 (three) times daily.  Indication: Generalized Anxiety Disorder   haloperidol 5 MG tablet Commonly known as: HALDOL Take 1 tablet (5 mg total) by mouth 3 (three) times daily.  Indication: Delirium   hydrOXYzine 25 MG tablet Commonly known as: ATARAX Take 1 tablet (25 mg total) by mouth 3 (three) times daily as needed for anxiety.  Indication: Feeling Anxious   propranolol 10 MG tablet Commonly known as: INDERAL Take 1 tablet (10 mg total) by mouth every 8 (eight) hours.  Indication: akathisia   traZODone 50 MG tablet Commonly known as: DESYREL Take 1 tablet (50 mg total) by mouth at bedtime as needed for sleep.  Indication: Major Depressive Disorder        Follow-up Information     [facility]. Go on 02/27/2023.   Specialty: Behavioral Health Why: You have an appointment for medication management services on 02/27/23 at 2:30 pm, in person.   You also have an appointment for therapy services on on 02/28/23 at 10:00 am, this will be a Virtual appt. Contact information: [address] [place] [address] [phone]                Discharge recommendations:   # It is recommended to the patient to continue psychiatric medications as prescribed, after discharge from the hospital.     # It is recommended to the patient to follow up with your outpatient psychiatric provider -instructions on appointment date, time, and address  (location) are provided to you in discharge paperwork  # Follow-up with outpatient primary care doctor and other specialists -for management of chronic medical disease, including:  Health maintenance   # Testing: Follow-up with outpatient provider for abnormal lab results:  See above   # It was discussed with the patient, the impact of alcohol, drugs, tobacco have been there overall psychiatric and medical wellbeing, and total abstinence from substance use was recommended to the patient.   # Prescriptions provided or sent directly to preferred pharmacy at discharge. Patient agreeable to plan. Given opportunity to ask questions. Appears to feel comfortable with discharge.    # In the event of worsening symptoms, the patient is instructed to call the crisis hotline, 911 and or go to the nearest ED for appropriate evaluation and treatment of symptoms. To follow-up  with primary care provider for other medical issues, concerns and or health care needs  Total Time Spent in Direct Patient Care: See attending attestation.  Signed: Princess Bruins, DO Psychiatry Resident, PGY-2 [facility] - Adult  [address] [place], [place] [address] Ph: [phone] Fax: [phone]

## 2023-02-22 NOTE — BHH Suicide Risk Assessment (Addendum)
[facility] Discharge Suicide Risk Assessment  Principal Problem: Schizophreniform psychosis (HCC) Discharge Diagnoses: Principal Problem:   Schizophreniform psychosis (HCC) Active Problems:   Tobacco use disorder   Cannabis use disorder   Reason for admission:  William Romero is a 41 y.o. male with PMH of MDD, ADHD and schizophrenia per chart, who presented voluntary to [facility] (02/13/2023), then admitted to [facility] Voluntary for AH and paranoia x3-4 months    PTA/Home Rx: adderall PCP: Philip Aspen, Limmie Patricia, MD  Hospital Course:   During the patient's hospitalization, patient had extensive initial psychiatric evaluation, and follow-up psychiatric evaluations every day.  Psychiatric diagnoses provided upon initial assessment:  Schizophreniform psychosis v substance induced psychosis Tobacco use d/o   Cannabis use d/o       Stimulant misuse d/o   Patient's psychiatric medications were adjusted on admission:  DISCONTINUED previous invega 6 mg PO STARTED risperdal 1 mg BID increased to 1 mg qAM + 2 mg qPM  HOLD home adderall 20 mg qAM + 10 mg in the afternoon   During the hospitalization, other adjustments were made to the patient's psychiatric medication regimen:  Continued cogentin 0.5 mg q12h  Continued haldol 5 mg TID Continued propranolol 10 mg q8h INCREASED gabapentin 100 mg to 300 mg TID  DISCONTINUED invega 6 mg PO on admission - opted for higher potency DISCONTINUED risperdal 1 mg BID to 1 mg qAM + 2 mg qPM - per patient's request   During the hospitalization lab/imaging obtained: BMI:29.7  CBC Hb 12.2, CMP 139, lipid wnl, a1c wnl, tsh wnl UDS+ marijuana Qtc 448  Patient's care was discussed during the interdisciplinary team meeting every day during the hospitalization.  Patient's side effects to prescribed psychiatric medications: NA  Assessment  Gradually, patient started adjusting to milieu. The patient was evaluated each day by a clinical provider to  ascertain response to treatment. Improvement was noted by the patient's report of decreasing symptoms, improved sleep and appetite, affect, medication tolerance, behavior, and participation in unit programming.  Patient was asked each day to complete a self inventory noting mood, mental status, pain, new symptoms, anxiety and concerns.    Symptoms were reported as significantly decreased or resolved completely by discharge.   On day of discharge, the patient reports that their mood is stable. The patient denied having suicidal thoughts for more than 48 hours prior to discharge.  Patient denies having homicidal thoughts.  Patient denies having auditory hallucinations.  Patient denies any visual hallucinations or other symptoms of psychosis. The patient was motivated to continue taking medication with a goal of continued improvement in mental health.   The patient reports their target psychiatric symptoms of auditory hallucinations responded well to the psychiatric medications, and the patient reports overall benefit other psychiatric hospitalization. Supportive psychotherapy was provided to the patient. The patient also participated in regular group therapy while hospitalized. Coping skills, problem solving as well as relaxation therapies were also part of the unit programming.  Labs were reviewed with the patient, and abnormal results were discussed with the patient.  The patient is able to verbalize their individual safety plan to this provider.  Behavioral Events: NA  Restraints: NA  Groups: Engaged, appropriate    Medication List    START taking these medications    benztropine 0.5 MG tablet; Commonly known as: COGENTIN; Take 1 tablet  (0.5 mg total) by mouth 2 (two) times daily.  gabapentin 300 MG capsule; Commonly known as: NEURONTIN; Take 1 capsule  (300  mg total) by mouth 3 (three) times daily.  haloperidol 5 MG tablet; Commonly known as: HALDOL; Take 1 tablet (5 mg  total) by mouth  3 (three) times daily.  hydrOXYzine 25 MG tablet; Commonly known as: ATARAX; Take 1 tablet (25  mg total) by mouth 3 (three) times daily as needed for anxiety.  propranolol 10 MG tablet; Commonly known as: INDERAL; Take 1 tablet (10  mg total) by mouth every 8 (eight) hours.  traZODone 50 MG tablet; Commonly known as: DESYREL; Take 1 tablet (50 mg  total) by mouth at bedtime as needed for sleep.   STOP taking these medications    amphetamine-dextroamphetamine 20 MG tablet; Commonly known as: Adderall  paliperidone 6 MG 24 hr tablet; Commonly known as: INVEGA     Musculoskeletal: Strength & Muscle Tone: within normal limits Gait & Station: normal Patient leans: N/A   Presentation  General Appearance:Appropriate for Environment, Casual, Fairly Groomed Eye Contact:Good Speech:Clear and Coherent, Normal Rate Volume:Normal Handedness:Right  Mood and Affect  Mood:Euthymic Affect:Appropriate, Congruent, Restricted  Thought Process  Thought Process:Coherent, Goal Directed, Linear Descriptions of Associations:Intact  Thought Content Suicidal Thoughts:Suicidal Thoughts: No Homicidal Thoughts:Homicidal Thoughts: No Hallucinations:Hallucinations: None, Auditory (able to reality test and he is not concerned. non-commanding) Ideas of Reference:None Thought Content:Rumination  Sensorium  Memory:Immediate Good Judgment:Fair Insight:Fair  Executive Functions  Orientation:Full (Time, Place and Person) Language:Good Concentration:Good Attention:Good Recall:Good Fund of Knowledge:Good  Psychomotor Activity  Psychomotor Activity:Psychomotor Activity: Normal  Assets  Assets:Communication Skills, Desire for Improvement, Resilience  Sleep  Quality:Good  Physical Exam Vitals and nursing note reviewed.  Constitutional:      General: He is not in acute distress.    Appearance: He is not ill-appearing, toxic-appearing or diaphoretic.  HENT:     Head: Normocephalic.   Pulmonary:     Effort: Pulmonary effort is normal. No respiratory distress.  Neurological:     Mental Status: He is alert.     Review of Systems  Respiratory:  Negative for shortness of breath.   Cardiovascular:  Negative for chest pain.  Gastrointestinal:  Negative for nausea and vomiting.  Neurological:  Negative for dizziness and headaches.     Blood pressure 115/87, pulse 97, temperature (!) 97.5 F (36.4 C), temperature source Oral, resp. rate 18, height 6' (1.829 m), weight 99.3 kg, SpO2 98 %. Body mass index is 29.7 kg/m.  Mental Status Per Nursing Assessment::   On Admission:  NA  Demographic Factors: Male, Caucasian, Low socioeconomic status  Loss Factors: Financial problems / change in socioeconomic status  Historical Factors: NA  Risk Reduction Factors:   Positive therapeutic relationship and Positive coping skills or problem solving skills  Continued Clinical Symptoms:  Alcohol/Substance Abuse/Dependencies Previous Psychiatric Diagnoses and Treatments Medical Diagnoses and Treatments/Surgeries  Cognitive Features That Contribute To Risk:  None    Suicide Risk:  Mild: There are no identifiable suicide plans, no associated intent, mild dysphoria and related symptoms, good self-control (both objective and subjective assessment), few other risk factors, and identifiable protective factors, including available and accessible social support    Follow-up Information     [facility]. Go on 02/27/2023.   Specialty: Behavioral Health Why: You have an appointment for medication management services on 02/27/23 at 2:30 pm, in person.   You also have an appointment for therapy services on on 02/28/23 at 10:00 am, this will be a Virtual appt. Contact information: [address] [place] [address] [phone]  Discharge recommendations:    # It is recommended to the patient to continue psychiatric medications  as prescribed, after discharge from the hospital.     # It is recommended to the patient to follow up with your outpatient psychiatric provider -instructions on appointment date, time, and address (location) are provided to you in discharge paperwork  # Follow-up with outpatient primary care doctor and other specialists -for management of chronic medical disease, including:  Health Maintenance   # Testing: Follow-up with outpatient provider for abnormal lab results:  See above   # It was discussed with the patient, the impact of alcohol, drugs, tobacco have been there overall psychiatric and medical wellbeing, and total abstinence from substance use was recommended to the patient.   # Prescriptions provided or sent directly to preferred pharmacy at discharge. Patient agreeable to plan. Given opportunity to ask questions. Appears to feel comfortable with discharge.    # In the event of worsening symptoms, the patient is instructed to call the crisis hotline, 911 and or go to the nearest ED for appropriate evaluation and treatment of symptoms. To follow-up with primary care provider for other medical issues, concerns and or health care needs  Patient agrees with D/C instructions and plan.   Total Time Spent in Direct Patient Care: See attending attestation.  Signed: Princess Bruins, DO Psychiatry Resident, PGY-2 [facility] - Adult  [address] [place], [place] [address] Ph: [phone] Fax: [phone] 02/22/2023, 10:05 AM

## 2023-02-22 NOTE — [facility] Group Notes (Signed)
[facility] Group Notes:  (Nursing/MHT/Case Management/Adjunct)  Date:  02/22/2023  Time:  11:55 AM  Type of Therapy:  Music Therapy  Participation Level:  Did Not Attend  Summary of Progress/Problems:  Patient was asked to choose a song that was uplifting and positive and then tell why they picked that certain song. Patient did not attend music group today.   Daneil Dan 02/22/2023, 11:55 AM

## 2023-02-22 NOTE — [facility] Group Notes (Signed)
[facility] Group Notes:  (Nursing/MHT/Case Management/Adjunct)  Date:  02/22/2023  Time:  9:26 AM  Type of Therapy:  Group Topic/ Focus: Goals Group: The focus of this group is to help patients establish daily goals to achieve during treatment and discuss how the patient can incorporate goal setting into their daily lives to aide in recovery.   Participation Level:  Did Not Attend  Summary of Progress/Problems:  Patient did not attend goals/ orientation group today.   Bralyn Espino R Taelynn Mcelhannon 02/22/2023, 9:26 AM 

## 2023-02-23 ENCOUNTER — Ambulatory Visit (HOSPITAL_COMMUNITY)
Admission: EM | Admit: 2023-02-23 | Discharge: 2023-02-23 | Disposition: A | Discharge status: Home / Self Care | Payer: No Payment, Other | Attending: Psychiatry | Admitting: Psychiatry

## 2023-02-23 ENCOUNTER — Other Ambulatory Visit: Payer: Self-pay

## 2023-02-23 DIAGNOSIS — R440 Auditory hallucinations: Secondary | ICD-10-CM

## 2023-02-23 DIAGNOSIS — Z79899 Other long term (current) drug therapy: Secondary | ICD-10-CM | POA: Insufficient documentation

## 2023-02-23 DIAGNOSIS — F209 Schizophrenia, unspecified: Secondary | ICD-10-CM | POA: Insufficient documentation

## 2023-02-23 DIAGNOSIS — Z5900 Homelessness unspecified: Secondary | ICD-10-CM | POA: Insufficient documentation

## 2023-02-23 DIAGNOSIS — F909 Attention-deficit hyperactivity disorder, unspecified type: Secondary | ICD-10-CM | POA: Insufficient documentation

## 2023-02-23 DIAGNOSIS — F22 Delusional disorders: Secondary | ICD-10-CM | POA: Insufficient documentation

## 2023-02-23 DIAGNOSIS — Z560 Unemployment, unspecified: Secondary | ICD-10-CM | POA: Insufficient documentation

## 2023-02-23 DIAGNOSIS — F1290 Cannabis use, unspecified, uncomplicated: Secondary | ICD-10-CM | POA: Insufficient documentation

## 2023-02-23 LAB — POCT URINE DRUG SCREEN - MANUAL ENTRY (I-SCREEN)
POC Amphetamine UR: NOT DETECTED
POC Buprenorphine (BUP): NOT DETECTED
POC Cocaine UR: NOT DETECTED
POC Marijuana UR: POSITIVE — AB
POC Methadone UR: NOT DETECTED
POC Methamphetamine UR: NOT DETECTED
POC Morphine: NOT DETECTED
POC Oxazepam (BZO): NOT DETECTED
POC Oxycodone UR: NOT DETECTED
POC Secobarbital (BAR): NOT DETECTED

## 2023-02-23 LAB — ETHANOL: Alcohol, Ethyl (B): <10 mg/dL (ref <10)

## 2023-02-23 MED ORDER — ZIPRASIDONE MESYLATE 20 MG IM SOLR: 20.0000 mg | INTRAMUSCULAR | Status: DC | PRN

## 2023-02-23 MED ORDER — PROPRANOLOL HCL 10 MG PO TABS
10.0000 mg | ORAL_TABLET | Freq: Three times a day (TID) | ORAL | Status: DC
  Administered 2023-02-23: 10 mg via ORAL
  Filled 2023-02-23: qty 1

## 2023-02-23 MED ORDER — HALOPERIDOL 5 MG PO TABS
5.0000 mg | ORAL_TABLET | Freq: Three times a day (TID) | ORAL | Status: DC
  Administered 2023-02-23: 5 mg via ORAL
  Filled 2023-02-23: qty 1

## 2023-02-23 MED ORDER — LORAZEPAM 1 MG PO TABS: 1.0000 mg | ORAL_TABLET | ORAL | Status: DC | PRN

## 2023-02-23 MED ORDER — MAGNESIUM HYDROXIDE 400 MG/5ML PO SUSP: 30.0000 mL | Freq: Every day | ORAL | Status: DC | PRN

## 2023-02-23 MED ORDER — ALUM & MAG HYDROXIDE-SIMETH 200-200-20 MG/5ML PO SUSP: 30.0000 mL | ORAL | Status: DC | PRN

## 2023-02-23 MED ORDER — ACETAMINOPHEN 325 MG PO TABS: 650.0000 mg | ORAL_TABLET | Freq: Four times a day (QID) | ORAL | Status: DC | PRN

## 2023-02-23 MED ORDER — BENZTROPINE MESYLATE 0.5 MG PO TABS
0.5000 mg | ORAL_TABLET | Freq: Two times a day (BID) | ORAL | Status: DC
  Administered 2023-02-23: 0.5 mg via ORAL
  Filled 2023-02-23: qty 1

## 2023-02-23 MED ORDER — OLANZAPINE 10 MG PO TBDP: 10.0000 mg | ORAL_TABLET | Freq: Three times a day (TID) | ORAL | Status: DC | PRN

## 2023-02-23 MED ORDER — GABAPENTIN 300 MG PO CAPS
300.0000 mg | ORAL_CAPSULE | Freq: Three times a day (TID) | ORAL | Status: DC
  Administered 2023-02-23: 300 mg via ORAL
  Filled 2023-02-23: qty 1

## 2023-02-23 MED ORDER — HYDROXYZINE HCL 25 MG PO TABS: 25.0000 mg | ORAL_TABLET | Freq: Three times a day (TID) | ORAL | Status: DC | PRN

## 2023-02-23 NOTE — ED Notes (Signed)
Pt A&O x 4, presents with feelings of paranoia.  Pt reports he feels like someone is out to get  him.  Denies SI, HI or AVH.  Monitoring for safety.

## 2023-02-23 NOTE — Progress Notes (Signed)
   02/23/23 0344  [facility] Triage Screening (Walk-ins at [facility] only)  How Did You Hear About [place]? Self  What Is the Reason for Your Visit/Call Today? Pt presents to [facility] voluntarily, with complaint of feeling paranoid. Pt reports he feels like someone is out to get him and he does not feel safe at this time. Pt reports being discharged from [facility] yesterday around 12pm for similar presentation. Pt reports diagnosis of schizophrenia and is has not taken medications [facility]. Pt currently denies SI,HI,AVH and substance/alcohol use.  How Long Has This Been Causing You Problems? <Week  Have You Recently Had Any Thoughts About Hurting Yourself? No  Are You Planning to Commit Suicide/Harm Yourself At This time? No  Have you Recently Had Thoughts About Hurting Someone [name]? No  Are You Planning To Harm Someone At This Time? No  Are you currently experiencing any auditory, visual or other hallucinations? No  Have You Used Any Alcohol or Drugs in the Past 24 Hours? No  Do you have any current medical co-morbidities that require immediate attention? No  Clinician description of patient physical appearance/behavior: calm, cooperative  What Do You Feel Would Help You the Most Today? Treatment for Depression or other mood problem  Determination of Need Routine (7 days)  Options For Referral Outpatient Therapy;Medication Management

## 2023-02-23 NOTE — BH Assessment (Signed)
Comprehensive Clinical Assessment (CCA) Note  02/23/2023 William Romero Pha [id number]  Disposition: CCA completed with Sindy Guadeloupe, NP who completed MSE and recommends continuous assessment, to be reassessed.  The patient demonstrates the following risk factors for suicide: Chronic risk factors for suicide include: psychiatric disorder of schizophrenia . Acute risk factors for suicide include: unemployment, social withdrawal/isolation, and recent discharge from inpatient psychiatry. Protective factors for this patient include: hope for the future. Considering these factors, the overall suicide risk at this point appears to be none. Patient is not appropriate for outpatient follow up.  William Romero is a 42 year old single male who presents voluntarily to [facility] due to auditory hallucinations and paranoia. Per chart, patient has a history of schizophrenia. Patient reports the voices are threatening his life, however, he is unable to states what the voices are specifically saying.  Patient was discharged from [facility] yesterday, 02/22/2023, after an 8-day stay. Patient states he was not paranoid or having auditory hallucinations when he discharged yesterday. Patient says he was given a 7-day supply of prescriptions; however, he left it at the "place I was dropped off at". Patient refuses to state where his medications are located. Patient denies SI, HI, or visual hallucinations. Patient denies access to guns or weapons. Patient reports he smoked two THC blunts yesterday. He denies additional substance use.   Patient is currently homeless and per chart, he has been homeless for two years. Patient states he has family who lives locally; however, they are not talking to him. Patient reports he is not currently receiving any outpatient mental health services. Patient is unemployed and not on disability.   Patient is dressed casually, alert, and oriented x3 with normal speech. Patient has a  flat affect and makes good eye contact. Patient's speech is coherent and there is no indication he is currently responding to internal stimuli. Patient was cooperative during the assessment.    Chief Complaint:  Chief Complaint  Patient presents with   Paranoid   Visit Diagnosis: Schizophrenia Paranoid Auditory hallucinations    CCA Screening, Triage and Referral (STR)  Patient Reported Information How did you hear about [place]? Self  What Is the Reason for Your Visit/Call Today? Pt presents to [facility] voluntarily, with complaint of feeling paranoid. Pt reports he feels like someone is out to get him and he does not feel safe at this time. Pt reports being discharged from [facility] yesterday around 12pm for similar presentation. Pt reports diagnosis of schizophrenia and is has not taken medications [facility]. Pt currently denies SI,HI,AVH and substance/alcohol use.  How Long Has This Been Causing You Problems? <Week  What Do You Feel Would Help You the Most Today? Treatment for Depression or other mood problem   Have You Recently Had Any Thoughts About Hurting Yourself? No  Are You Planning to Commit Suicide/Harm Yourself At This time? No   Flowsheet Row ED from 02/23/2023 in [facility] Admission (Discharged) from 02/14/2023 in [facility] ED from 02/13/2023 in [facility]  C-SSRS RISK CATEGORY No Risk No Risk No Risk       Have you Recently Had Thoughts About Hurting Someone William Romero? No  Are You Planning to Harm Someone at This Time? No  Explanation: N/A   Have You Used Any Alcohol or Drugs in the Past 24 Hours? Yes  What Did You Use and How Much? THC, two blunts   Do  You Currently Have a Therapist/Psychiatrist? No  Name of Therapist/Psychiatrist: Name of Therapist/Psychiatrist: N/A   Have You Been Recently Discharged From Any Office Practice or Programs?  Yes  Explanation of Discharge From Practice/Program: Discharged from [facility] 02/22/2023     CCA Screening Triage Referral Assessment Type of Contact: Face-to-Face  Telemedicine Service Delivery:   Is this Initial or Reassessment?   Date Telepsych consult ordered in [facility]:    Time Telepsych consult ordered in [facility]:    Location of Assessment: [facility] Services  Provider Location: [facility] Assessment Services   Collateral Involvement: None   Does Patient Have a Automotive engineer Guardian? No  Legal Guardian Contact Information: N/A  Copy of Legal Guardianship Form: -- (N/A)  Legal Guardian Notified of Arrival: -- (N/A)  Legal Guardian Notified of Pending Discharge: -- (N/A)  If Minor and Not Living with Parent(s), Who has Custody? N/A  Is CPS involved or ever been involved? Never  Is APS involved or ever been involved? Never   Patient Determined To Be At Risk for Harm To Self or Others Based on Review of Patient Reported Information or Presenting Complaint? No (Denies SI/HI.)  Method: No Plan (Denies SI/HI.)  Availability of Means: No access or NA (Denies SI/HI.)  Intent: Vague intent or NA (Denies SI/HI.)  Notification Required: No need or identified person (Denies SI/HI.)  Additional Information for Danger to Others Potential: -- (N/A)  Additional Comments for Danger to Others Potential: N/A  Are There Guns or Other Weapons in Your Home? No  Types of Guns/Weapons: N/A  Are These Weapons Safely Secured?                            -- (N/A)  Who Could Verify You Are Able To Have These Secured: N/A  Do You Have any Outstanding Charges, Pending Court Dates, Parole/Probation? Patient denies.  Contacted To Inform of Risk of Harm To Self or Others: -- (N/A)    Does Patient Present under Involuntary Commitment? No    [place] of Residence: [place]   Patient Currently Receiving the Following Services: Not Receiving Services   Determination of Need:  Urgent (48 hours)   Options For Referral: Outpatient Therapy; Medication Management; Inpatient Hospitalization     CCA Biopsychosocial Patient Reported Schizophrenia/Schizoaffective Diagnosis in Past: Yes   Strengths: Unknown.   Mental Health Symptoms Depression:   None   Duration of Depressive symptoms:    Mania:   None   Anxiety:    Worrying; Sleep   Psychosis:   Hallucinations   Duration of Psychotic symptoms:  Duration of Psychotic Symptoms: Greater than six months   Trauma:   None   Obsessions:   None   Compulsions:   None   Inattention:   None   Hyperactivity/Impulsivity:   None   Oppositional/Defiant Behaviors:   None   Emotional Irregularity:   None   Other Mood/Personality Symptoms:   N/A    Mental Status Exam Appearance and self-care  Stature:   Tall   Weight:   Average weight   Clothing:   Disheveled   Grooming:   Neglected   Cosmetic use:   None   Posture/gait:   Normal   Motor activity:   Not Remarkable   Sensorium  Attention:   Normal   Concentration:   Normal   Orientation:   Object; Person; Place; Situation   Recall/memory:   Normal   Affect and Mood  Affect:   Flat   Mood:   Other (Comment) (Calm)   Relating  Eye contact:   Normal   Facial expression:   Anxious   Attitude toward examiner:   Cooperative   Thought and Language  Speech flow:  Normal   Thought content:   Appropriate to Mood and Circumstances   Preoccupation:   None   Hallucinations:   Auditory   Organization:   Patent examiner of Knowledge:   Average   Intelligence:   Average   Abstraction:   Normal   Judgement:   Fair   Dance movement psychotherapist:   Adequate   Insight:   Lacking   Decision Making:   Normal   Social Functioning  Social Maturity:   Isolates   Social Judgement:   "Chief of Staff"   Stress  Stressors:   Family conflict; Housing   Coping Ability:    Contractor Deficits:   None   Supports:   Support needed     Religion: Religion/Spirituality Are You A Religious Person?: No How Might This Affect Treatment?: N/A  Leisure/Recreation: Leisure / Recreation Do You Have Hobbies?: Yes Leisure and Hobbies: Refurbishing items.  Exercise/Diet: Exercise/Diet Do You Exercise?: No Have You Gained or Lost A Significant Amount of Weight in the Past Six Months?: No Do You Follow a Special Diet?: No Do You Have Any Trouble Sleeping?: Yes Explanation of Sleeping Difficulties: Pt reports poor sleep.   CCA Employment/Education Employment/Work Situation: Employment / Work Situation Employment Situation: Unemployed Patient's Job has Been Impacted by Current Illness: No Has Patient ever Been in Equities trader?: No  Education: Education Is Patient Currently Attending School?: No Last Grade Completed: 12 Did You Product manager?: Yes What Type of College Degree Do you Have?: Two years of college Did You Have An Individualized Education Program (IIEP): No Did You Have Any Difficulty At School?: No Patient's Education Has Been Impacted by Current Illness: No   CCA Family/Childhood History Family and Relationship History: Family history Marital status: Single Does patient have children?: No  Childhood History:  Childhood History By whom was/is the patient raised?: Both parents Description of patient's current relationship with siblings: Pt reports strained relationship with family. Did patient suffer any verbal/emotional/physical/sexual abuse as a child?: No Did patient suffer from severe childhood neglect?: No Has patient ever been sexually abused/assaulted/raped as an adolescent or adult?: No Was the patient ever a victim of a crime or a disaster?: No Witnessed domestic violence?: No Has patient been affected by domestic violence as an adult?: No       CCA Substance Use Alcohol/Drug Use: Alcohol / Drug Use Pain  Medications: See MAR Prescriptions: See MAR Over the Counter: See MAR History of alcohol / drug use?: Yes (THC.) Longest period of sobriety (when/how long): N/A Negative Consequences of Use:  (N/A) Withdrawal Symptoms:  (N/A)                         ASAM's:  Six Dimensions of Multidimensional Assessment  Dimension 1:  Acute Intoxication and/or Withdrawal Potential:      Dimension 2:  Biomedical Conditions and Complications:      Dimension 3:  Emotional, Behavioral, or Cognitive Conditions and Complications:     Dimension 4:  Readiness to Change:     Dimension 5:  Relapse, Continued use, or Continued Problem Potential:     Dimension 6:  Recovery/Living Environment:     ASAM  Severity Score:    ASAM Recommended Level of Treatment:     Substance use Disorder (SUD)    Recommendations for Services/Supports/Treatments:    Discharge Disposition:    DSM5 Diagnoses: Patient Active Problem List   Diagnosis Date Noted   Tobacco use disorder 02/16/2023   Cannabis use disorder 02/16/2023   Schizophreniform psychosis (HCC) 02/15/2023   Schizophrenia (HCC) 11/01/2022   ADHD 03/28/2019     Referrals to Alternative Service(s): Referred to Alternative Service(s):   Place:   Date:   Time:    Referred to Alternative Service(s):   Place:   Date:   Time:    Referred to Alternative Service(s):   Place:   Date:   Time:    Referred to Alternative Service(s):   Place:   Date:   Time:     Cleda Clarks, [name]

## 2023-02-23 NOTE — ED Provider Notes (Signed)
[facility] Continuous Assessment Admission H&P  Date: 02/23/23 Patient Name: William Romero MRN: [id number] Chief Complaint: paranoid that someone is about to get me  Diagnoses:  Final diagnoses:  Paranoia [facility])  Auditory hallucination  Homelessness unspecified    HPI: William Vito "Scott",  42 y/o male with a history of schizophrenia and ADHD, presented to [facility] voluntarily reports he is experienced paranoia that someone is about to get him.  He also reported hearing voices when asked what the voices are saying he stated he cannot remember if they are out to get him.  Review of patient records show that patient was just discharged from [facility] less than 24 hours ago.  Per the patient he is also currently homeless, when asked where is his family patient stated they do not talk to him.  Review of patient records show that patient was hospitalized between 6-7 times and some of these times involved IVC.  According to patient he smoke marijuana after being discharged today.  Patient is unemployed at this time.  Patient denies access to guns.  Face-to-face observation of patient, patient is alert and oriented to person and place and time, maintaining eye contact.  Patient appears to be paranoid, he also reports that they are after him but could not elaborate.  Patient denies SI, HI,.  Patient also reported auditory hallucinations saying he hears voices that are telling him bad things about himself.  But patient could not elaborate on exactly what that things are.  Patient stated that he was dropped off today after being discharged and he left his medicine there but patient cannot tell me where was he drank affect.  Patient denies alcohol use, denies access to guns.  At this time patient is not being followed by an ACT team.  Will restart patient home medications.   Recommend inpatient observation   Total Time spent with patient: 20 minutes  Musculoskeletal  Strength & Muscle Tone: within  normal limits Gait & Station: normal Patient leans: N/A  Psychiatric Specialty Exam  Presentation General Appearance:  Casual  Eye Contact: Good  Speech: Clear and Coherent  Speech Volume: Normal  Handedness: Right   Mood and Affect  Mood: Euthymic; Anxious  Affect: Appropriate   Thought Process  Thought Processes: Linear  Descriptions of Associations:Circumstantial  Orientation:Full (Time, Place and Person)  Thought Content:Rumination  Diagnosis of Schizophrenia or Schizoaffective disorder in past: Yes   Hallucinations:Hallucinations: Auditory Description of Auditory Hallucinations: voice,  that are after him  Ideas of Reference:Paranoia  Suicidal Thoughts:Suicidal Thoughts: No  Homicidal Thoughts:Homicidal Thoughts: No   Sensorium  Memory: Immediate Fair  Judgment: Poor  Insight: Fair   Art therapist  Concentration: Fair  Attention Span: Good  Recall: Good  Fund of Knowledge: Good  Language: Good   Psychomotor Activity  Psychomotor Activity: Psychomotor Activity: Normal   Assets  Assets: Desire for Improvement; Housing   Sleep  Sleep: Sleep: Poor Number of Hours of Sleep: 3   Nutritional Assessment (For OBS and FBC admissions only) Has the patient had a weight loss or gain of 10 pounds or more in the last 3 months?: No Has the patient had a decrease in food intake/or appetite?: No Does the patient have dental problems?: No Does the patient have eating habits or behaviors that may be indicators of an eating disorder including binging or inducing vomiting?: No Has the patient recently lost weight without trying?: 0 Has the patient been eating poorly because of a decreased appetite?: 0  Malnutrition Screening Tool Score: 0    Physical Exam HENT:     Head: Normocephalic.     Nose: Nose normal.  Cardiovascular:     Rate and Rhythm: Normal rate.  Pulmonary:     Effort: Pulmonary effort is normal.   Musculoskeletal:        General: Normal range of motion.     Cervical back: Normal range of motion.  Neurological:     General: No focal deficit present.     Mental Status: He is alert.  Psychiatric:        Mood and Affect: Mood normal.        Behavior: Behavior normal.        Thought Content: Thought content normal.        Judgment: Judgment normal.    Review of Systems  Constitutional: Negative.   HENT: Negative.    Eyes: Negative.   Respiratory: Negative.    Cardiovascular: Negative.   Genitourinary: Negative.   Musculoskeletal: Negative.   Skin: Negative.   Neurological: Negative.   Psychiatric/Behavioral:  Positive for hallucinations. The patient is nervous/anxious.     Blood pressure 130/85, pulse 91, temperature 98.7 F (37.1 C), temperature source Oral, resp. rate 20, SpO2 98 %. There is no height or weight on file to calculate BMI.  Past Psychiatric History: schizophrenia,  ADHD   Is the patient at risk to self? No  Has the patient been a risk to self in the past 6 months? No .    Has the patient been a risk to self within the distant past? No   Is the patient a risk to others? No   Has the patient been a risk to others in the past 6 months? No   Has the patient been a risk to others within the distant past? No   Past Medical History: see chart   Family History: unknown   Social History: marijuana and tobacco   Last Labs:  Admission on 02/13/2023, Discharged on 02/14/2023  Component Date Value Ref Range Status   WBC 02/13/2023 6.4  4.0 - 10.5 K/uL Final   RBC 02/13/2023 4.11 (L)  4.22 - 5.81 MIL/uL Final   Hemoglobin 02/13/2023 12.2 (L)  13.0 - 17.0 g/dL Final   HCT 16/06/9603 37.9 (L)  39.0 - 52.0 % Final   MCV 02/13/2023 92.2  80.0 - 100.0 fL Final   MCH 02/13/2023 29.7  26.0 - 34.0 pg Final   MCHC 02/13/2023 32.2  30.0 - 36.0 g/dL Final   RDW 54/05/8118 14.0  11.5 - 15.5 % Final   Platelets 02/13/2023 329  150 - 400 K/uL Final   nRBC 02/13/2023  0.0  0.0 - 0.2 % Final   Neutrophils Relative % 02/13/2023 73  % Final   Neutro Abs 02/13/2023 4.7  1.7 - 7.7 K/uL Final   Lymphocytes Relative 02/13/2023 15  % Final   Lymphs Abs 02/13/2023 0.9  0.7 - 4.0 K/uL Final   Monocytes Relative 02/13/2023 7  % Final   Monocytes Absolute 02/13/2023 0.5  0.1 - 1.0 K/uL Final   Eosinophils Relative 02/13/2023 3  % Final   Eosinophils Absolute 02/13/2023 0.2  0.0 - 0.5 K/uL Final   Basophils Relative 02/13/2023 1  % Final   Basophils Absolute 02/13/2023 0.1  0.0 - 0.1 K/uL Final   Immature Granulocytes 02/13/2023 1  % Final   Abs Immature Granulocytes 02/13/2023 0.03  0.00 - 0.07 K/uL Final   Performed at [facility], [address], [place], [place] [address]   Sodium 02/13/2023 142  135 - 145 mmol/L Final   Potassium 02/13/2023 4.2  3.5 - 5.1 mmol/L Final   Chloride 02/13/2023 107  98 - 111 mmol/L Final   CO2 02/13/2023 23  22 - 32 mmol/L Final   Glucose, Bld 02/13/2023 139 (H)  70 - 99 mg/dL Final   Glucose reference range applies only to samples taken after fasting for at least 8 hours.   BUN 02/13/2023 13  6 - 20 mg/dL Final   Creatinine, Ser 02/13/2023 0.95  0.61 - 1.24 mg/dL Final   Calcium 29/52/8413 8.8 (L)  8.9 - 10.3 mg/dL Final   Total Protein 24/40/1027 6.5  6.5 - 8.1 g/dL Final   Albumin 25/36/6440 3.5  3.5 - 5.0 g/dL Final   AST 34/74/2595 31  15 - 41 U/L Final   ALT 02/13/2023 24  0 - 44 U/L Final   Alkaline Phosphatase 02/13/2023 76  38 - 126 U/L Final   Total Bilirubin 02/13/2023 0.3  0.3 - 1.2 mg/dL Final   GFR, Estimated 02/13/2023 >60  >60 mL/min Final   Comment: (NOTE) Calculated using the CKD-EPI Creatinine Equation (2021)    Anion gap 02/13/2023 12  5 - 15 Final   Performed at [facility], [address], [place], [place] [address]   Hgb A1c MFr Bld 02/13/2023 5.0  4.8 - 5.6 % Final   Comment: (NOTE) Pre diabetes:          5.7%-6.4%  Diabetes:              >6.4%  Glycemic control for   <7.0% adults  with diabetes    Mean Plasma Glucose 02/13/2023 96.8  mg/dL Final   Performed at [facility], [address] [place], [place] [address]   Magnesium 02/13/2023 2.3  1.7 - 2.4 mg/dL Final   Performed at [facility], [address], [place], [place] [address]   Alcohol, Ethyl (B) 02/13/2023 <10  <10 mg/dL Final   Comment: (NOTE) Lowest detectable limit for serum alcohol is 10 mg/dL.  For medical purposes only. Performed at [facility], [address], [place], [place] [address]    Cholesterol 02/13/2023 150  0 - 200 mg/dL Final   Triglycerides 63/09/6008 100  <150 mg/dL Final   HDL 93/23/5573 53  >40 mg/dL Final   Total CHOL/HDL Ratio 02/13/2023 2.8  RATIO Final   VLDL 02/13/2023 20  0 - 40 mg/dL Final   LDL Cholesterol 02/13/2023 77  0 - 99 mg/dL Final   Comment:        Total Cholesterol/HDL:CHD Risk Coronary Heart Disease Risk Table                     Men   Women  1/2 Average Risk   3.4   3.3  Average Risk       5.0   4.4  2 X Average Risk   9.6   7.1  3 X Average Risk  23.4   11.0        Use the calculated Patient Ratio above and the CHD Risk Table to determine the patient's CHD Risk.        ATP III CLASSIFICATION (LDL):  <100     mg/dL   Optimal  220-254  mg/dL   Near or Above  Optimal  130-159  mg/dL   Borderline  161-096  mg/dL   High  >045     mg/dL   Very High Performed at [facility], [address] [place], [place] [address]    TSH 02/13/2023 0.426  0.350 - 4.500 uIU/mL Final   Comment: Performed by a 3rd Generation assay with a functional sensitivity of <=0.01 uIU/mL. Performed at [facility], [address] [place], [place] [address]    POC Amphetamine UR 02/13/2023 None Detected  NONE DETECTED (Cut Off Level 1000 ng/mL) Final   POC Secobarbital (BAR) 02/13/2023 None Detected  NONE DETECTED (Cut Off Level 300 ng/mL) Final   POC Buprenorphine (BUP) 02/13/2023 None Detected  NONE DETECTED (Cut Off Level 10  ng/mL) Final   POC Oxazepam (BZO) 02/13/2023 None Detected  NONE DETECTED (Cut Off Level 300 ng/mL) Final   POC Cocaine UR 02/13/2023 None Detected  NONE DETECTED (Cut Off Level 300 ng/mL) Final   POC Methamphetamine UR 02/13/2023 None Detected  NONE DETECTED (Cut Off Level 1000 ng/mL) Final   POC Morphine 02/13/2023 None Detected  NONE DETECTED (Cut Off Level 300 ng/mL) Final   POC Methadone UR 02/13/2023 None Detected  NONE DETECTED (Cut Off Level 300 ng/mL) Final   POC Oxycodone UR 02/13/2023 None Detected  NONE DETECTED (Cut Off Level 100 ng/mL) Final   POC Marijuana UR 02/13/2023 Positive (A)  NONE DETECTED (Cut Off Level 50 ng/mL) Final  Admission on 02/08/2023, Discharged on 02/08/2023  Component Date Value Ref Range Status   B Natriuretic Peptide 02/08/2023 16.3  0.0 - 100.0 pg/mL Final   Performed at [facility], [address], [place], [place] [address]   WBC 02/08/2023 3.8 (L)  4.0 - 10.5 K/uL Final   RBC 02/08/2023 3.90 (L)  4.22 - 5.81 MIL/uL Final   Hemoglobin 02/08/2023 11.8 (L)  13.0 - 17.0 g/dL Final   HCT 21/30/8657 34.9 (L)  39.0 - 52.0 % Final   MCV 02/08/2023 89.5  80.0 - 100.0 fL Final   MCH 02/08/2023 30.3  26.0 - 34.0 pg Final   MCHC 02/08/2023 33.8  30.0 - 36.0 g/dL Final   RDW 84/69/6295 13.3  11.5 - 15.5 % Final   Platelets 02/08/2023 324  150 - 400 K/uL Final   nRBC 02/08/2023 0.0  0.0 - 0.2 % Final   Neutrophils Relative % 02/08/2023 48  % Final   Neutro Abs 02/08/2023 1.8  1.7 - 7.7 K/uL Final   Lymphocytes Relative 02/08/2023 32  % Final   Lymphs Abs 02/08/2023 1.2  0.7 - 4.0 K/uL Final   Monocytes Relative 02/08/2023 13  % Final   Monocytes Absolute 02/08/2023 0.5  0.1 - 1.0 K/uL Final   Eosinophils Relative 02/08/2023 6  % Final   Eosinophils Absolute 02/08/2023 0.2  0.0 - 0.5 K/uL Final   Basophils Relative 02/08/2023 1  % Final   Basophils Absolute 02/08/2023 0.0  0.0 - 0.1 K/uL Final   Immature Granulocytes 02/08/2023 0  % Final    Abs Immature Granulocytes 02/08/2023 0.01  0.00 - 0.07 K/uL Final   Performed at [facility], [address] [place], [place] [address]   Sodium 02/08/2023 138  135 - 145 mmol/L Final   Potassium 02/08/2023 3.3 (L)  3.5 - 5.1 mmol/L Final   Chloride 02/08/2023 106  98 - 111 mmol/L Final   CO2 02/08/2023 24  22 - 32 mmol/L Final   Glucose, Bld  02/08/2023 138 (H)  70 - 99 mg/dL Final   Glucose reference range applies only to samples taken after fasting for at least 8 hours.   BUN 02/08/2023 12  6 - 20 mg/dL Final   Creatinine, Ser 02/08/2023 0.96  0.61 - 1.24 mg/dL Final   Calcium 16/06/9603 8.2 (L)  8.9 - 10.3 mg/dL Final   Total Protein 54/05/8118 6.4 (L)  6.5 - 8.1 g/dL Final   Albumin 14/78/2956 3.3 (L)  3.5 - 5.0 g/dL Final   AST 21/30/8657 29  15 - 41 U/L Final   ALT 02/08/2023 21  0 - 44 U/L Final   Alkaline Phosphatase 02/08/2023 70  38 - 126 U/L Final   Total Bilirubin 02/08/2023 0.5  0.3 - 1.2 mg/dL Final   GFR, Estimated 02/08/2023 >60  >60 mL/min Final   Comment: (NOTE) Calculated using the CKD-EPI Creatinine Equation (2021)    Anion gap 02/08/2023 8  5 - 15 Final   Performed at [facility], [address] [place], [place] [address]  Admission on 12/03/2022, Discharged on 12/03/2022  Component Date Value Ref Range Status   Sodium 12/03/2022 138  135 - 145 mmol/L Final   Potassium 12/03/2022 2.9 (L)  3.5 - 5.1 mmol/L Final   Chloride 12/03/2022 105  98 - 111 mmol/L Final   CO2 12/03/2022 26  22 - 32 mmol/L Final   Glucose, Bld 12/03/2022 124 (H)  70 - 99 mg/dL Final   Glucose reference range applies only to samples taken after fasting for at least 8 hours.   BUN 12/03/2022 10  6 - 20 mg/dL Final   Creatinine, Ser 12/03/2022 0.88  0.61 - 1.24 mg/dL Final   Calcium 29/52/8413 8.6 (L)  8.9 - 10.3 mg/dL Final   Total Protein 24/40/1027 6.8  6.5 - 8.1 g/dL Final   Albumin 25/36/6440 3.7  3.5 - 5.0 g/dL Final   AST 34/74/2595 29  15  - 41 U/L Final   ALT 12/03/2022 23  0 - 44 U/L Final   Alkaline Phosphatase 12/03/2022 67  38 - 126 U/L Final   Total Bilirubin 12/03/2022 0.7  0.3 - 1.2 mg/dL Final   GFR, Estimated 12/03/2022 >60  >60 mL/min Final   Comment: (NOTE) Calculated using the CKD-EPI Creatinine Equation (2021)    Anion gap 12/03/2022 7  5 - 15 Final   Performed at [facility], [address], [place], [place] [address]   Opiates 12/03/2022 NONE DETECTED  NONE DETECTED Final   Cocaine 12/03/2022 NONE DETECTED  NONE DETECTED Final   Benzodiazepines 12/03/2022 NONE DETECTED  NONE DETECTED Final   Amphetamines 12/03/2022 POSITIVE (A)  NONE DETECTED Final   Tetrahydrocannabinol 12/03/2022 POSITIVE (A)  NONE DETECTED Final   Barbiturates 12/03/2022 NONE DETECTED  NONE DETECTED Final   Comment: (NOTE) DRUG SCREEN FOR MEDICAL PURPOSES ONLY.  IF CONFIRMATION IS NEEDED FOR ANY PURPOSE, NOTIFY LAB WITHIN 5 DAYS.  LOWEST DETECTABLE LIMITS FOR URINE DRUG SCREEN Drug Class                     Cutoff (ng/mL) Amphetamine and metabolites    1000 Barbiturate and metabolites    200 Benzodiazepine                 200 Opiates and metabolites        300 Cocaine and metabolites        300 THC  50 Performed at [facility] [address], [place], [place] [address]    WBC 12/03/2022 5.6  4.0 - 10.5 K/uL Final   RBC 12/03/2022 4.14 (L)  4.22 - 5.81 MIL/uL Final   Hemoglobin 12/03/2022 12.4 (L)  13.0 - 17.0 g/dL Final   HCT 60/45/4098 36.5 (L)  39.0 - 52.0 % Final   MCV 12/03/2022 88.2  80.0 - 100.0 fL Final   MCH 12/03/2022 30.0  26.0 - 34.0 pg Final   MCHC 12/03/2022 34.0  30.0 - 36.0 g/dL Final   RDW 11/91/4782 13.3  11.5 - 15.5 % Final   Platelets 12/03/2022 317  150 - 400 K/uL Final   nRBC 12/03/2022 0.0  0.0 - 0.2 % Final   Neutrophils Relative % 12/03/2022 65  % Final   Neutro Abs 12/03/2022 3.6  1.7 - 7.7 K/uL Final   Lymphocytes Relative  12/03/2022 20  % Final   Lymphs Abs 12/03/2022 1.1  0.7 - 4.0 K/uL Final   Monocytes Relative 12/03/2022 9  % Final   Monocytes Absolute 12/03/2022 0.5  0.1 - 1.0 K/uL Final   Eosinophils Relative 12/03/2022 5  % Final   Eosinophils Absolute 12/03/2022 0.3  0.0 - 0.5 K/uL Final   Basophils Relative 12/03/2022 1  % Final   Basophils Absolute 12/03/2022 0.1  0.0 - 0.1 K/uL Final   Immature Granulocytes 12/03/2022 0  % Final   Abs Immature Granulocytes 12/03/2022 0.01  0.00 - 0.07 K/uL Final   Performed at [facility], [address] [place], [place] [address]  Admission on 09/03/2022, Discharged on 09/04/2022  Component Date Value Ref Range Status   B Natriuretic Peptide 09/03/2022 16.9  0.0 - 100.0 pg/mL Final   Performed at [facility], [address], [place], [place] [address]   Troponin I (High Sensitivity) 09/03/2022 3  <18 ng/L Final   Comment: (NOTE) Elevated high sensitivity troponin I (hsTnI) values and significant  changes across serial measurements may suggest ACS but many other  chronic and acute conditions are known to elevate hsTnI results.  Refer to the "Links" section for chest pain algorithms and additional  guidance. Performed at [facility], [address] [place], [place] [address]    Sodium 09/03/2022 135  135 - 145 mmol/L Final   Potassium 09/03/2022 3.2 (L)  3.5 - 5.1 mmol/L Final   Chloride 09/03/2022 97 (L)  98 - 111 mmol/L Final   CO2 09/03/2022 26  22 - 32 mmol/L Final   Glucose, Bld 09/03/2022 111 (H)  70 - 99 mg/dL Final   Glucose reference range applies only to samples taken after fasting for at least 8 hours.   BUN 09/03/2022 9  6 - 20 mg/dL Final   Creatinine, Ser 09/03/2022 0.97  0.61 - 1.24 mg/dL Final   Calcium 62/95/2841 8.8 (L)  8.9 - 10.3 mg/dL Final   Total Protein 32/44/0102 8.5 (H)  6.5 - 8.1 g/dL Final   Albumin 72/53/6644 3.9  3.5 - 5.0 g/dL Final   AST 03/47/4259 22  15 - 41  U/L Final   ALT 09/03/2022 22  0 - 44 U/L Final   Alkaline Phosphatase 09/03/2022 79  38 - 126 U/L Final   Total Bilirubin 09/03/2022 0.9  0.3 - 1.2 mg/dL Final   GFR, Estimated 09/03/2022 >60  >60 mL/min Final   Comment: (NOTE) Calculated using the CKD-EPI Creatinine Equation (2021)    Anion gap 09/03/2022 12  5 - 15 Final   Performed at [facility], [address] [place], [place] [address]   SARS Coronavirus 2 by RT PCR 09/03/2022 NEGATIVE  NEGATIVE Final   Comment: (NOTE) SARS-CoV-2 target nucleic acids are NOT DETECTED.  The SARS-CoV-2 RNA is generally detectable in upper respiratory specimens during the acute phase of infection. The lowest concentration of SARS-CoV-2 viral copies this assay can detect is 138 copies/mL. A negative result does not preclude SARS-Cov-2 infection and should not be used as the sole basis for treatment or other patient management decisions. A negative result may occur with  improper specimen collection/handling, submission of specimen other than nasopharyngeal swab, presence of viral mutation(s) within the areas targeted by this assay, and inadequate number of viral copies(<138 copies/mL). A negative result must be combined with clinical observations, patient history, and epidemiological information. The expected result is Negative.  Fact Sheet for Patients:  BloggerCourse.com  Fact Sheet for Healthcare Providers:  SeriousBroker.it  This test is no                          t yet approved or cleared by the [place] FDA and  has been authorized for detection and/or diagnosis of SARS-CoV-2 by FDA under an Emergency Use Authorization (EUA). This EUA will remain  in effect (meaning this test can be used) for the duration of the COVID-19 declaration under Section 564(b)(1) of the Act, 21 U.S.C.section 360bbb-3(b)(1), unless the authorization is terminated  or revoked sooner.        Influenza A by PCR 09/03/2022 NEGATIVE  NEGATIVE Final   Influenza B by PCR 09/03/2022 NEGATIVE  NEGATIVE Final   Comment: (NOTE) The Xpert Xpress SARS-CoV-2/FLU/RSV plus assay is intended as an aid in the diagnosis of influenza from Nasopharyngeal swab specimens and should not be used as a sole basis for treatment. Nasal washings and aspirates are unacceptable for Xpert Xpress SARS-CoV-2/FLU/RSV testing.  Fact Sheet for Patients: BloggerCourse.com  Fact Sheet for Healthcare Providers: SeriousBroker.it  This test is not yet approved or cleared by the [place] FDA and has been authorized for detection and/or diagnosis of SARS-CoV-2 by FDA under an Emergency Use Authorization (EUA). This EUA will remain in effect (meaning this test can be used) for the duration of the COVID-19 declaration under Section 564(b)(1) of the Act, 21 U.S.C. section 360bbb-3(b)(1), unless the authorization is terminated or revoked.     Resp Syncytial Virus by PCR 09/03/2022 NEGATIVE  NEGATIVE Final   Comment: (NOTE) Fact Sheet for Patients: BloggerCourse.com  Fact Sheet for Healthcare Providers: SeriousBroker.it  This test is not yet approved or cleared by the [place] FDA and has been authorized for detection and/or diagnosis of SARS-CoV-2 by FDA under an Emergency Use Authorization (EUA). This EUA will remain in effect (meaning this test can be used) for the duration of the COVID-19 declaration under Section 564(b)(1) of the Act, 21 U.S.C. section 360bbb-3(b)(1), unless the authorization is terminated or revoked.  Performed at [facility], [address], [place], [place] [address]     Allergies: Patient has no known allergies.  Medications:  Facility Ordered Medications  Medication   acetaminophen (TYLENOL) tablet 650 mg   alum & mag hydroxide-simeth  (MAALOX/MYLANTA) 200-200-20 MG/5ML suspension 30 mL   magnesium hydroxide (MILK OF MAGNESIA) suspension 30 mL   OLANZapine zydis (ZYPREXA) disintegrating tablet 10 mg   And   LORazepam (ATIVAN) tablet 1 mg   And  ziprasidone (GEODON) injection 20 mg   propranolol (INDERAL) tablet 10 mg   hydrOXYzine (ATARAX) tablet 25 mg   haloperidol (HALDOL) tablet 5 mg   gabapentin (NEURONTIN) capsule 300 mg   benztropine (COGENTIN) tablet 0.5 mg   PTA Medications  Medication Sig   benztropine (COGENTIN) 0.5 MG tablet Take 1 tablet (0.5 mg total) by mouth 2 (two) times daily.   gabapentin (NEURONTIN) 300 MG capsule Take 1 capsule (300 mg total) by mouth 3 (three) times daily.   haloperidol (HALDOL) 5 MG tablet Take 1 tablet (5 mg total) by mouth 3 (three) times daily.   hydrOXYzine (ATARAX) 25 MG tablet Take 1 tablet (25 mg total) by mouth 3 (three) times daily as needed for anxiety.   propranolol (INDERAL) 10 MG tablet Take 1 tablet (10 mg total) by mouth every 8 (eight) hours.   traZODone (DESYREL) 50 MG tablet Take 1 tablet (50 mg total) by mouth at bedtime as needed for sleep.      Medical Decision Making  Inpatient observation    Meds ordered this encounter  Medications   acetaminophen (TYLENOL) tablet 650 mg   alum & mag hydroxide-simeth (MAALOX/MYLANTA) 200-200-20 MG/5ML suspension 30 mL   magnesium hydroxide (MILK OF MAGNESIA) suspension 30 mL   AND Linked Order Group    OLANZapine zydis (ZYPREXA) disintegrating tablet 10 mg    LORazepam (ATIVAN) tablet 1 mg    ziprasidone (GEODON) injection 20 mg   propranolol (INDERAL) tablet 10 mg   hydrOXYzine (ATARAX) tablet 25 mg   haloperidol (HALDOL) tablet 5 mg   gabapentin (NEURONTIN) capsule 300 mg   benztropine (COGENTIN) tablet 0.5 mg    Lab Orders         Ethanol         POCT Urine Drug Screen - (I-Screen)     Recommendations  Based on my evaluation the patient appears to have an emergency medical condition for which I  recommend the patient be transferred to the emergency department for further evaluation.  Sindy Guadeloupe, NP 02/23/23  5:49 AM

## 2023-02-23 NOTE — Discharge Instructions (Addendum)
Discharge recommendations:   Medications: Patient is to take medications as prescribed. The patient or patient's guardian is to contact a medical professional and/or outpatient provider to address any new side effects that develop. The patient or the patient's guardian should update outpatient providers of any new medications and/or medication changes.   Outpatient Follow up:Go to [facility]) on 02/27/2023; You have an appointment for medication management services on 02/27/23 at 2:30 pm, in person. You also have an appointment for therapy services on on 02/28/23 at 10:00 am, this will be a Virtual appt. Please follow up with your primary care provider for all medical related needs.   Therapy: We recommend that patient participate in individual therapy to address mental health concerns.  Atypical antipsychotics: If you are prescribed an atypical antipsychotic, it is recommended that your height, weight, BMI, blood pressure, fasting lipid panel, and fasting blood sugar be monitored by your outpatient providers.  Safety:   The following safety precautions should be taken:   No sharp objects. This includes scissors, razors, scrapers, and putty knives.   Chemicals should be removed and locked up.   Medications should be removed and locked up.   Weapons should be removed and locked up. This includes firearms, knives and instruments that can be used to cause injury.   The patient should abstain from use of illicit substances/drugs and abuse of any medications.  If symptoms worsen or do not continue to improve or if the patient becomes actively suicidal or homicidal then it is recommended that the patient return to the closest hospital emergency department, the [facility], or call 911 for further evaluation and treatment. National Suicide Prevention Lifeline 1-800-SUICIDE or (780)438-6949.  About 988 988 offers 24/7  access to trained crisis counselors who can help people experiencing mental health-related distress. People can call or text 988 or chat 988lifeline.org for themselves or if they are worried about a loved one who may need crisis support.

## 2023-02-23 NOTE — ED Notes (Signed)
Patient observed calmly resting. Even rise and fall of chest. No current distress. 

## 2023-02-23 NOTE — ED Notes (Signed)
Patient is discharging at this time. Patient is A&Ox4 and cooperative at this time. Patient currently denies SI,HI, and A/V/H with no plan or intent but states still has some slight paranoia. Patient compliant with medication this morning. AVS printed out and reviewed with patient along with follow up appointments. Patient also provided with bus pass. Patient verbalized all understanding. All valuables/belongings returned to patient. Patient in no current distress.

## 2023-02-23 NOTE — ED Provider Notes (Addendum)
FBC/OBS ASAP Discharge Summary  Date and Time: 02/23/2023 10:55 AM  Name: William Romero  MRN:  [id number]   Discharge Diagnoses:  Final diagnoses:  Paranoia [facility])  Auditory hallucination  Homelessness unspecified    Subjective: Patient seen and evaluated face-to-face by this provider, chart reviewed and case discussed with Dr. Lucianne Muss. On evaluation, patient is alert and oriented x 4. His thought process is linear and goal oriented. His speech is clear and coherent. His mood is dysphoric and affect is congruent. He denies SI/HI. He states that yesterday after he left the behavioral health hospital he smoked some pot around 3 PM and later that night he went to the library to sleep and was unable to sleep because he was experiencing paranoia and thought that someone was out to get him. His UDS on arrival was positive for THC. BAL was negative. Today, he reports decreased hallucinations and paranoia but states that sometimes he feels like people are still out to get him. There is no objective evidence on exam that the patient is responding to internal or external stimuli. I dicussed with the patient the importance of medication compliance to reduce symptoms of psychosis such as paranoia. I discussed with the patient at length to refrain from using marijuana as it can worsen psychosis in patients with a history of schizophrenia. Patient verbalizes understanding. Patient asked if this provider could assist with him going to a group home. When asked why does he need to go to a group home, he states that he has nowhere to go and is trying to get a job. I discussed with the patient resources for the homeless population and provided the patient with an option to go to the [facility] this morning for shelter and community resources. Patient agreeable to the stated plan. He states that he does not need a prescription or sample medications and that he can go back to get his medications where he left them. He would not  disclose where he left his medications. Safety plan and completed. Patient denies access to weapons. Patient to discharge with either a bus pass or taxi to go the [facility].  Stay Summary: William Romero is a 42 year old male patient with a past psychiatric history significant for ADHD, schizophreniform, psychosis, and cannabis use who presented to the [facility] behavioral health urgent care of [facility] on 02/22/2023 with a chief complaint of paranoia and auditory hallucinations. Per chart review, patient was discharged from the [facility] on yesterday after a 7 day hospitalization. Patient was admitted to the continuous assessment unit on arrival and restarted back on his home medications which included Cogentin 0.5 mg p.o. twice daily, gabapentin 300 mg p.o. 3 times daily, Haldol 5 mg p.o. 3 times daily and Inderal 10 mg every 8 hours. Patient was observed overnight without any disruptive, aggressive, psychotic, or self-harm behaviors. Patient compliant with taking scheduled medications without any noticeable side effects.    Total Time spent with patient: 30 minutes  Past Psychiatric History: history of schizophreniform, ADHD, psychosis, and cannabis use. Patient recently hospitalized at [facility] from 02/14/23 to 02/22/23.  Past Medical History: No significant medical history.  Family History: No known family history.  Family Psychiatric History: No known family history.  Social History: Homelessness. Unemployed. Cannabis use.  Tobacco Cessation:  Prescription not provided because: declined  Current Medications:  Current Facility-Administered Medications  Medication Dose Route Frequency Provider Last Rate Last Admin   acetaminophen (TYLENOL) tablet 650 mg  650 mg Oral Q6H  PRN Sindy Guadeloupe, NP       alum & mag hydroxide-simeth (MAALOX/MYLANTA) 200-200-20 MG/5ML suspension 30 mL  30 mL Oral Q4H PRN Sindy Guadeloupe, NP       benztropine (COGENTIN) tablet 0.5 mg  0.5 mg Oral BID Sindy Guadeloupe,  NP   0.5 mg at 02/23/23 1610   gabapentin (NEURONTIN) capsule 300 mg  300 mg Oral TID Sindy Guadeloupe, NP   300 mg at 02/23/23 9604   haloperidol (HALDOL) tablet 5 mg  5 mg Oral TID Sindy Guadeloupe, NP   5 mg at 02/23/23 5409   hydrOXYzine (ATARAX) tablet 25 mg  25 mg Oral TID PRN Sindy Guadeloupe, NP       OLANZapine zydis (ZYPREXA) disintegrating tablet 10 mg  10 mg Oral Q8H PRN Sindy Guadeloupe, NP       And   LORazepam (ATIVAN) tablet 1 mg  1 mg Oral PRN Sindy Guadeloupe, NP       And   ziprasidone (GEODON) injection 20 mg  20 mg Intramuscular PRN Sindy Guadeloupe, NP       magnesium hydroxide (MILK OF MAGNESIA) suspension 30 mL  30 mL Oral Daily PRN Sindy Guadeloupe, NP       propranolol (INDERAL) tablet 10 mg  10 mg Oral Alphia Kava, NP   10 mg at 02/23/23 0602   Current Outpatient Medications  Medication Sig Dispense Refill   benztropine (COGENTIN) 0.5 MG tablet Take 1 tablet (0.5 mg total) by mouth 2 (two) times daily. 60 tablet 0   gabapentin (NEURONTIN) 300 MG capsule Take 1 capsule (300 mg total) by mouth 3 (three) times daily. 90 capsule 0   haloperidol (HALDOL) 5 MG tablet Take 1 tablet (5 mg total) by mouth 3 (three) times daily. 90 tablet 0   hydrOXYzine (ATARAX) 25 MG tablet Take 1 tablet (25 mg total) by mouth 3 (three) times daily as needed for anxiety. 60 tablet 0   propranolol (INDERAL) 10 MG tablet Take 1 tablet (10 mg total) by mouth every 8 (eight) hours. 90 tablet 0   traZODone (DESYREL) 50 MG tablet Take 1 tablet (50 mg total) by mouth at bedtime as needed for sleep. 30 tablet 0    PTA Medications:  Facility Ordered Medications  Medication   acetaminophen (TYLENOL) tablet 650 mg   alum & mag hydroxide-simeth (MAALOX/MYLANTA) 200-200-20 MG/5ML suspension 30 mL   magnesium hydroxide (MILK OF MAGNESIA) suspension 30 mL   OLANZapine zydis (ZYPREXA) disintegrating tablet 10 mg   And   LORazepam (ATIVAN) tablet 1 mg   And   ziprasidone (GEODON) injection 20 mg   propranolol  (INDERAL) tablet 10 mg   hydrOXYzine (ATARAX) tablet 25 mg   haloperidol (HALDOL) tablet 5 mg   gabapentin (NEURONTIN) capsule 300 mg   benztropine (COGENTIN) tablet 0.5 mg   PTA Medications  Medication Sig   benztropine (COGENTIN) 0.5 MG tablet Take 1 tablet (0.5 mg total) by mouth 2 (two) times daily.   gabapentin (NEURONTIN) 300 MG capsule Take 1 capsule (300 mg total) by mouth 3 (three) times daily.   haloperidol (HALDOL) 5 MG tablet Take 1 tablet (5 mg total) by mouth 3 (three) times daily.   hydrOXYzine (ATARAX) 25 MG tablet Take 1 tablet (25 mg total) by mouth 3 (three) times daily as needed for anxiety.   propranolol (INDERAL) 10 MG tablet Take 1 tablet (10 mg total) by mouth every 8 (eight) hours.   traZODone (DESYREL) 50 MG tablet Take 1 tablet (  50 mg total) by mouth at bedtime as needed for sleep.       09/26/2022    3:43 PM 06/21/2022    8:20 AM 01/03/2022   11:30 AM  Depression screen PHQ 2/9  Decreased Interest 0 0 0  Down, Depressed, Hopeless 0 0 0  PHQ - 2 Score 0 0 0  Altered sleeping 0 0 0  Tired, decreased energy 0 0 0  Change in appetite 0 0 0  Feeling bad or failure about yourself  0 0 0  Trouble concentrating 0 0 0  Moving slowly or fidgety/restless 0 0 0  Suicidal thoughts 0 0 0  PHQ-9 Score 0 0 0  Difficult doing work/chores Not difficult at all Not difficult at all Not difficult at all    [facility] ED from 02/23/2023 in [facility] Admission (Discharged) [facility] from 02/13/2023 in [facility]  C-SSRS RISK CATEGORY No Risk No Risk No Risk       Musculoskeletal  Strength & Muscle Tone: within normal limits Gait & Station: normal Patient leans: N/A  Psychiatric Specialty Exam  Presentation  General Appearance:  Casual  Eye Contact: Fair  Speech: Clear and Coherent  Speech Volume: Normal  Handedness: Right   Mood and  Affect  Mood: Dysphoric  Affect: Congruent   Thought Process  Thought Processes: Coherent; Goal Directed  Descriptions of Associations:Intact  Orientation:Full (Time, Place and Person)  Thought Content:Logical  Diagnosis of Schizophrenia or Schizoaffective disorder in past: Yes    Hallucinations:Hallucinations: None Description of Auditory Hallucinations: voice,  that are after him  Ideas of Reference:Paranoia  Suicidal Thoughts:Suicidal Thoughts: No  Homicidal Thoughts:Homicidal Thoughts: No   Sensorium  Memory: Immediate Fair; Recent Fair; Remote Fair  Judgment: Intact  Insight: Present; Shallow   Executive Functions  Concentration: Fair  Attention Span: Fair  Recall: [employer] of Knowledge: Fair  Language: Fair   Psychomotor Activity  Psychomotor Activity: Psychomotor Activity: Normal   Assets  Assets: Communication Skills; Desire for Improvement; Physical Health; Leisure Time   Sleep  Sleep: Sleep: Fair Number of Hours of Sleep: 6   Nutritional Assessment (For OBS and FBC admissions only) Has the patient had a weight loss or gain of 10 pounds or more in the last 3 months?: No Has the patient had a decrease in food intake/or appetite?: No Does the patient have dental problems?: No Does the patient have eating habits or behaviors that may be indicators of an eating disorder including binging or inducing vomiting?: No Has the patient recently lost weight without trying?: 0 Has the patient been eating poorly because of a decreased appetite?: 0 Malnutrition Screening Tool Score: 0    Physical Exam  Physical Exam HENT:     Head: Normocephalic.     Nose: Nose normal.  Eyes:     Conjunctiva/sclera: Conjunctivae normal.  Cardiovascular:     Rate and Rhythm: Normal rate.  Pulmonary:     Effort: Pulmonary effort is normal.  Musculoskeletal:        General: Normal range of motion.     Cervical back: Normal range of motion.   Neurological:     Mental Status: He is alert and oriented to person, place, and time.    Review of Systems  Constitutional: Negative.   HENT: Negative.    Eyes: Negative.   Respiratory: Negative.    Cardiovascular: Negative.   Gastrointestinal: Negative.  Genitourinary: Negative.   Musculoskeletal: Negative.   Neurological: Negative.   Endo/Heme/Allergies: Negative.    Blood pressure 130/85, pulse 91, temperature 98.7 F (37.1 C), temperature source Oral, resp. rate 20, SpO2 98 %. There is no height or weight on file to calculate BMI.  Demographic Factors:  Male, Low socioeconomic status, and Unemployed  Loss Factors: Financial problems/change in socioeconomic status  Historical Factors: NA  Risk Reduction Factors:   Sense of responsibility to family and Positive coping skills or problem solving skills  Continued Clinical Symptoms:  Previous Psychiatric Diagnoses and Treatments  Cognitive Features That Contribute To Risk:  None    Suicide Risk:  Minimal: No identifiable suicidal ideation.  Patients presenting with no risk factors but with morbid ruminations; may be classified as minimal risk based on the severity of the depressive symptoms  Plan Of Care/Follow-up recommendations:  Activity:  as tolerated  Medications: Patient is to take medications as prescribed. The patient or patient's guardian is to contact a medical professional and/or outpatient provider to address any new side effects that develop. The patient or the patient's guardian should update outpatient providers of any new medications and/or medication changes.   Outpatient Follow up:Go to [facility]) on 02/27/2023; You have an appointment for medication management services on 02/27/23 at 2:30 pm, in person. You also have an appointment for therapy services on on 02/28/23 at 10:00 am, this will be a Virtual appt. Please follow up with your primary care  provider for all medical related needs.   Community resources: Patient provided with a list of shelter/housing resources in the community.   Therapy: We recommend that patient participate in individual therapy to address mental health concerns.  Atypical antipsychotics: If you are prescribed an atypical antipsychotic, it is recommended that your height, weight, BMI, blood pressure, fasting lipid panel, and fasting blood sugar be monitored by your outpatient providers.  Safety:   The following safety precautions should be taken:   No sharp objects. This includes scissors, razors, scrapers, and putty knives.   Chemicals should be removed and locked up.   Medications should be removed and locked up.   Weapons should be removed and locked up. This includes firearms, knives and instruments that can be used to cause injury.   The patient should abstain from use of illicit substances/drugs and abuse of any medications.  If symptoms worsen or do not continue to improve or if the patient becomes actively suicidal or homicidal then it is recommended that the patient return to the closest hospital emergency department, the [facility], or call 911 for further evaluation and treatment. National Suicide Prevention Lifeline 1-800-SUICIDE or (661)758-7297.  About 988 988 offers 24/7 access to trained crisis counselors who can help people experiencing mental health-related distress. People can call or text 988 or chat 988lifeline.org for themselves or if they are worried about a loved one who may need crisis support.   Disposition: Discharge home.   Sanaiyah Kirchhoff L, NP 02/23/2023, 10:55 AM

## 2023-02-24 ENCOUNTER — Emergency Department (HOSPITAL_COMMUNITY)
Admission: EM | Admit: 2023-02-24 | Discharge: 2023-02-24 | Disposition: A | Discharge status: Home / Self Care | Payer: Self-pay | Attending: Emergency Medicine | Admitting: Emergency Medicine

## 2023-02-24 ENCOUNTER — Emergency Department (HOSPITAL_COMMUNITY): Payer: Self-pay

## 2023-02-24 ENCOUNTER — Ambulatory Visit (HOSPITAL_COMMUNITY)
Admission: EM | Admit: 2023-02-24 | Discharge: 2023-02-25 | Disposition: A | Discharge status: Other Acute Inpatient Hospital | Payer: No Payment, Other | Attending: Urology | Admitting: Urology

## 2023-02-24 ENCOUNTER — Other Ambulatory Visit: Payer: Self-pay

## 2023-02-24 DIAGNOSIS — Z5329 Procedure and treatment not carried out because of patient's decision for other reasons: Secondary | ICD-10-CM | POA: Insufficient documentation

## 2023-02-24 DIAGNOSIS — F200 Paranoid schizophrenia: Secondary | ICD-10-CM | POA: Insufficient documentation

## 2023-02-24 DIAGNOSIS — R200 Anesthesia of skin: Secondary | ICD-10-CM | POA: Insufficient documentation

## 2023-02-24 DIAGNOSIS — F17200 Nicotine dependence, unspecified, uncomplicated: Secondary | ICD-10-CM | POA: Insufficient documentation

## 2023-02-24 DIAGNOSIS — R479 Unspecified speech disturbances: Secondary | ICD-10-CM | POA: Insufficient documentation

## 2023-02-24 DIAGNOSIS — F22 Delusional disorders: Secondary | ICD-10-CM

## 2023-02-24 LAB — RAPID URINE DRUG SCREEN, HOSP PERFORMED
Amphetamines: NOT DETECTED
Barbiturates: NOT DETECTED
Benzodiazepines: NOT DETECTED
Cocaine: NOT DETECTED
Opiates: NOT DETECTED
Tetrahydrocannabinol: POSITIVE — AB

## 2023-02-24 LAB — DIFFERENTIAL
Abs Immature Granulocytes: 0.02 10*3/uL (ref 0.00–0.07)
Basophils Absolute: 0.1 10*3/uL (ref 0.0–0.1)
Basophils Relative: 1 %
Eosinophils Absolute: 0.2 10*3/uL (ref 0.0–0.5)
Eosinophils Relative: 3 %
Immature Granulocytes: 0 %
Lymphocytes Relative: 17 %
Lymphs Abs: 1.2 10*3/uL (ref 0.7–4.0)
Monocytes Absolute: 0.5 10*3/uL (ref 0.1–1.0)
Monocytes Relative: 7 %
Neutro Abs: 5 10*3/uL (ref 1.7–7.7)
Neutrophils Relative %: 72 %

## 2023-02-24 LAB — I-STAT CHEM 8, ED
BUN: 12 mg/dL (ref 6–20)
Calcium, Ion: 1.14 mmol/L — ABNORMAL LOW (ref 1.15–1.40)
Chloride: 104 mmol/L (ref 98–111)
Creatinine, Ser: 1.1 mg/dL (ref 0.61–1.24)
Glucose, Bld: 83 mg/dL (ref 70–99)
HCT: 43 % (ref 39.0–52.0)
Hemoglobin: 14.6 g/dL (ref 13.0–17.0)
Potassium: 3.8 mmol/L (ref 3.5–5.1)
Sodium: 141 mmol/L (ref 135–145)
TCO2: 25 mmol/L (ref 22–32)

## 2023-02-24 LAB — COMPREHENSIVE METABOLIC PANEL
ALT: 52 U/L — ABNORMAL HIGH (ref 0–44)
AST: 36 U/L (ref 15–41)
Albumin: 4.3 g/dL (ref 3.5–5.0)
Alkaline Phosphatase: 95 U/L (ref 38–126)
Anion gap: 7 (ref 5–15)
BUN: 13 mg/dL (ref 6–20)
CO2: 24 mmol/L (ref 22–32)
Calcium: 8.4 mg/dL — ABNORMAL LOW (ref 8.9–10.3)
Chloride: 106 mmol/L (ref 98–111)
Creatinine, Ser: 1.09 mg/dL (ref 0.61–1.24)
GFR, Estimated: >60 mL/min (ref >60)
Glucose, Bld: 86 mg/dL (ref 70–99)
Potassium: 3.6 mmol/L (ref 3.5–5.1)
Sodium: 137 mmol/L (ref 135–145)
Total Bilirubin: 0.9 mg/dL (ref 0.3–1.2)
Total Protein: 7.3 g/dL (ref 6.5–8.1)

## 2023-02-24 LAB — URINALYSIS, ROUTINE W REFLEX MICROSCOPIC
Bilirubin Urine: NEGATIVE
Glucose, UA: NEGATIVE mg/dL
Hgb urine dipstick: NEGATIVE
Ketones, ur: NEGATIVE mg/dL
Leukocytes,Ua: NEGATIVE
Nitrite: NEGATIVE
Protein, ur: NEGATIVE mg/dL
Specific Gravity, Urine: 1.019 (ref 1.005–1.030)
pH: 6 (ref 5.0–8.0)

## 2023-02-24 LAB — PROTIME-INR
INR: 1 (ref 0.8–1.2)
Prothrombin Time: 13.4 seconds (ref 11.4–15.2)

## 2023-02-24 LAB — CBC
HCT: 42 % (ref 39.0–52.0)
Hemoglobin: 13.6 g/dL (ref 13.0–17.0)
MCH: 29.7 pg (ref 26.0–34.0)
MCHC: 32.4 g/dL (ref 30.0–36.0)
MCV: 91.7 fL (ref 80.0–100.0)
Platelets: 297 10*3/uL (ref 150–400)
RBC: 4.58 MIL/uL (ref 4.22–5.81)
RDW: 14.2 % (ref 11.5–15.5)
WBC: 6.9 10*3/uL (ref 4.0–10.5)
nRBC: 0 % (ref 0.0–0.2)

## 2023-02-24 LAB — ETHANOL: Alcohol, Ethyl (B): <10 mg/dL (ref <10)

## 2023-02-24 LAB — APTT: aPTT: 28 seconds (ref 24–36)

## 2023-02-24 MED ORDER — TRAZODONE HCL 50 MG PO TABS: 50.0000 mg | ORAL_TABLET | Freq: Every evening | ORAL | Status: DC | PRN

## 2023-02-24 MED ORDER — ACETAMINOPHEN 325 MG PO TABS: 650.0000 mg | ORAL_TABLET | Freq: Four times a day (QID) | ORAL | Status: DC | PRN

## 2023-02-24 MED ORDER — RISPERIDONE 3 MG PO TABS
3.0000 mg | ORAL_TABLET | Freq: Two times a day (BID) | ORAL | Status: DC
  Filled 2023-02-24 (×2): qty 1

## 2023-02-24 MED ORDER — HYDROXYZINE HCL 25 MG PO TABS: 25.0000 mg | ORAL_TABLET | Freq: Three times a day (TID) | ORAL | Status: DC | PRN

## 2023-02-24 MED ORDER — MAGNESIUM HYDROXIDE 400 MG/5ML PO SUSP: 30.0000 mL | Freq: Every day | ORAL | Status: DC | PRN

## 2023-02-24 MED ORDER — ALUM & MAG HYDROXIDE-SIMETH 200-200-20 MG/5ML PO SUSP: 30.0000 mL | ORAL | Status: DC | PRN

## 2023-02-24 NOTE — ED Notes (Signed)
Pt is requesting to leave AMA. Advised pt of the risks of leaving AMA, up to death. Pt verbalized understanding. MD aware.

## 2023-02-24 NOTE — ED Notes (Signed)
Pt A&O x 4, presents with passive SI, no plan noted, paranoia, auditory  hallucinations.  Pt calm & cooperative.  Comfort measures given.  Monitoring for safety.

## 2023-02-24 NOTE — ED Triage Notes (Signed)
Pt presents to [facility] voluntarily. Pt reports having paranoia andthoughts of wanting to harm himself. Pt does endorse SI with no plan. Pt denies HI. Pt does endorse auditory hallucinations. Pt is routine.

## 2023-02-24 NOTE — ED Provider Notes (Signed)
[place] EMERGENCY DEPARTMENT AT [facility] Provider Note   CSN: [id number] Arrival date & time: 02/24/23  1001     History {Add pertinent medical, surgical, social history, OB history to HPI:1} Chief Complaint  Patient presents with   Weakness    William Romero is a 42 y.o. male.  HPI     When walk having to grab hem of shirt and not swing arms like normal, left arm tingling, some difficulty forming words, not sure if represented a stroke  Has been having symptoms for hte past 2 days Numbness of left, not weakness per pt on my hx  Not having difficulty walking Right eye blurry at the top right, not all the time but sometimes No headache, no nausea or vomiting Reports symptoms have been there for 2 days but didn't really notice, was in the ED for evaluation with concern for paranoia When walk is not swinging left arm and that is odd for him Thinks medications caused the paranoid thoughts.  No other fever, cough, cp, dyspnea   Per note yesterday: : William Romero is a 42 year old male patient with a past psychiatric history significant for ADHD, schizophreniform, psychosis, and cannabis use who presented to the [facility] behavioral health urgent care of [facility] on 02/22/2023 with a chief complaint of paranoia and auditory hallucinations. Per chart review, patient was discharged from the [facility] on yesterday after a 7 day hospitalization. Patient was admitted to the continuous assessment unit on arrival and restarted back on his home medications which included Cogentin 0.5 mg p.o. twice daily, gabapentin 300 mg p.o. 3 times daily, Haldol 5 mg p.o. 3 times daily and Inderal 10 mg every 8 hours. Patient was observed overnight without any disruptive, aggressive, psychotic, or self-harm behaviors. Patient compliant with taking scheduled medications without any noticeable side effects.     No htn, hlpd, dm, fam hx Does smoke, use mj, no other drugs, occ  etoh.  Past Medical History:  Diagnosis Date   Schizophrenia (HCC)      Home Medications Prior to Admission medications   Medication Sig Start Date End Date Taking? Authorizing Provider  benztropine (COGENTIN) 0.5 MG tablet Take 1 tablet (0.5 mg total) by mouth 2 (two) times daily. 02/22/23 03/24/23  Princess Bruins, DO  gabapentin (NEURONTIN) 300 MG capsule Take 1 capsule (300 mg total) by mouth 3 (three) times daily. 02/22/23 03/24/23  Princess Bruins, DO  haloperidol (HALDOL) 5 MG tablet Take 1 tablet (5 mg total) by mouth 3 (three) times daily. 02/22/23 03/24/23  Princess Bruins, DO  hydrOXYzine (ATARAX) 25 MG tablet Take 1 tablet (25 mg total) by mouth 3 (three) times daily as needed for anxiety. 02/22/23 03/24/23  Princess Bruins, DO  propranolol (INDERAL) 10 MG tablet Take 1 tablet (10 mg total) by mouth every 8 (eight) hours. 02/22/23 03/24/23  Princess Bruins, DO  traZODone (DESYREL) 50 MG tablet Take 1 tablet (50 mg total) by mouth at bedtime as needed for sleep. 02/22/23 03/24/23  Princess Bruins, DO      Allergies    Patient has no known allergies.    Review of Systems   Review of Systems  Physical Exam Updated Vital Signs BP (!) 146/68 (BP Location: Left Arm)   Pulse 78   Temp 98.5 F (36.9 C) (Oral)   Resp 16   Ht 6' (1.829 m)   Wt 98.9 kg   SpO2 99%   BMI 29.57 kg/m  Physical Exam  ED Results /  Procedures / Treatments   Labs (all labs ordered are listed, but only abnormal results are displayed) Labs Reviewed  COMPREHENSIVE METABOLIC PANEL - Abnormal; Notable for the following components:      Result Value   Calcium 8.4 (*)    ALT 52 (*)    All other components within normal limits  RAPID URINE DRUG SCREEN, HOSP PERFORMED - Abnormal; Notable for the following components:   Tetrahydrocannabinol POSITIVE (*)    All other components within normal limits  I-STAT CHEM 8, ED - Abnormal; Notable for the following components:   Calcium, Ion 1.14 (*)    All other components within  normal limits  ETHANOL  PROTIME-INR  APTT  CBC  DIFFERENTIAL  URINALYSIS, ROUTINE W REFLEX MICROSCOPIC    EKG EKG Interpretation  Date/Time:  Friday February 24 2023 10:33:36 EDT Ventricular Rate:  91 PR Interval:  161 QRS Duration: 111 QT Interval:  382 QTC Calculation: 470 R Axis:   16 Text Interpretation: Sinus rhythm No significant change since last tracing Confirmed by Alvira Monday ([id number]) on 02/24/2023 12:06:19 PM  Radiology CT HEAD WO CONTRAST  Result Date: 02/24/2023 CLINICAL DATA:  Left-sided weakness and expressive aphasia. Began 2 days ago. EXAM: CT HEAD WITHOUT CONTRAST TECHNIQUE: Contiguous axial images were obtained from the base of the skull through the vertex without intravenous contrast. RADIATION DOSE REDUCTION: This exam was performed according to the departmental dose-optimization program which includes automated exposure control, adjustment of the mA and/or kV according to patient size and/or use of iterative reconstruction technique. COMPARISON:  CT head 12/02/2017 FINDINGS: Brain: There is no acute intracranial hemorrhage, extra-axial fluid collection, or acute infarct Parenchymal volume is normal. The ventricles are normal in size. Gray-white differentiation is preserved The pituitary and suprasellar region are normal. There is no mass lesion. There is no mass effect or midline shift. Vascular: No hyperdense vessel or unexpected calcification. Skull: Normal. Negative for fracture or focal lesion. Sinuses/Orbits: The paranasal sinuses are clear. The globes and orbits are unremarkable. Other: There are small bilateral mastoid effusions, also present in 2019. The imaged nasopharynx is unremarkable. IMPRESSION: 1. No acute intracranial pathology. 2. Small bilateral mastoid effusions, nonspecific but unchanged since 2019. Electronically Signed   By: Lesia Hausen M.D.   On: 02/24/2023 11:36    Procedures Procedures  {Document cardiac monitor, telemetry assessment  procedure when appropriate:1}  Medications Ordered in ED Medications - No data to display  ED Course/ Medical Decision Making/ A&P   {   Click here for ABCD2, HEART and other calculatorsREFRESH Note before signing :1}                          Medical Decision Making Amount and/or Complexity of Data Reviewed Labs: ordered. Radiology: ordered.   ***  {Document critical care time when appropriate:1} {Document review of labs and clinical decision tools ie heart score, Chads2Vasc2 etc:1}  {Document your independent review of radiology images, and any outside records:1} {Document your discussion with family members, caretakers, and with consultants:1} {Document social determinants of health affecting pt's care:1} {Document your decision making why or why not admission, treatments were needed:1} Final Clinical Impression(s) / ED Diagnoses Final diagnoses:  None    Rx / DC Orders ED Discharge Orders     None

## 2023-02-24 NOTE — Progress Notes (Signed)
   02/24/23 2128  [facility]UC Triage Screening (Walk-ins at [facility] only)  How Did You Hear About [place]? Legal System  What Is the Reason for Your Visit/Call Today? Pt presents to [facility] voluntarily. Pt reports having paranoia andthoughts of wanting to harm himself. Pt does endorse SI with no plan. Pt denies HI. Pt does endorse auditory hallucinations. Pt is routine.  How Long Has This Been Causing You Problems? <Week  Have You Recently Had Any Thoughts About Hurting Yourself? Yes  How long ago did you have thoughts about hurting yourself? today  Are You Planning to Commit Suicide/Harm Yourself At This time? No  Have you Recently Had Thoughts About Hurting Someone [name]? No  Are You Planning To Harm Someone At This Time? No  Are you currently experiencing any auditory, visual or other hallucinations? Yes  Please explain the hallucinations you are currently experiencing: Pt does report auditory hallucinations and paranoia.  Have You Used Any Alcohol or Drugs in the Past 24 Hours? No  Do you have any current medical co-morbidities that require immediate attention? No  Clinician description of patient physical appearance/behavior: Pt is fairly groomed, calm and cooperative.  What Do You Feel Would Help You the Most Today? Treatment for Depression or other mood problem  If access to [facility] was not available, would you have sought care in the Emergency Department? Yes  Determination of Need Routine (7 days)  Options For Referral Intensive Outpatient Therapy

## 2023-02-24 NOTE — ED Triage Notes (Addendum)
Pt c/o L sided weakness and expressive aphasia (trouble finding the right word sometimes) that began 2x days ago. Pt has no focal deficits while in triage. Speech clear, strength + bilat, no drift  AOx4

## 2023-02-25 ENCOUNTER — Inpatient Hospital Stay (HOSPITAL_COMMUNITY)
Admission: EM | Admit: 2023-02-25 | Discharge: 2023-03-06 | DRG: 885 | Disposition: A | Discharge status: Home / Self Care | Payer: No Typology Code available for payment source | Source: Intra-hospital | Attending: Psychiatry | Admitting: Psychiatry

## 2023-02-25 ENCOUNTER — Encounter (HOSPITAL_COMMUNITY): Payer: Self-pay | Admitting: Psychiatry

## 2023-02-25 DIAGNOSIS — Z5900 Homelessness unspecified: Secondary | ICD-10-CM

## 2023-02-25 DIAGNOSIS — F1210 Cannabis abuse, uncomplicated: Secondary | ICD-10-CM | POA: Diagnosis present

## 2023-02-25 DIAGNOSIS — F909 Attention-deficit hyperactivity disorder, unspecified type: Secondary | ICD-10-CM | POA: Diagnosis present

## 2023-02-25 DIAGNOSIS — F2081 Schizophreniform disorder: Secondary | ICD-10-CM | POA: Diagnosis not present

## 2023-02-25 DIAGNOSIS — F17200 Nicotine dependence, unspecified, uncomplicated: Secondary | ICD-10-CM | POA: Diagnosis present

## 2023-02-25 DIAGNOSIS — F200 Paranoid schizophrenia: Principal | ICD-10-CM | POA: Diagnosis present

## 2023-02-25 DIAGNOSIS — F329 Major depressive disorder, single episode, unspecified: Secondary | ICD-10-CM | POA: Diagnosis present

## 2023-02-25 DIAGNOSIS — F22 Delusional disorders: Secondary | ICD-10-CM | POA: Diagnosis present

## 2023-02-25 DIAGNOSIS — Z5902 Unsheltered homelessness: Secondary | ICD-10-CM | POA: Diagnosis not present

## 2023-02-25 DIAGNOSIS — Z5941 Food insecurity: Secondary | ICD-10-CM | POA: Diagnosis not present

## 2023-02-25 DIAGNOSIS — R440 Auditory hallucinations: Secondary | ICD-10-CM | POA: Diagnosis present

## 2023-02-25 DIAGNOSIS — F209 Schizophrenia, unspecified: Principal | ICD-10-CM | POA: Diagnosis present

## 2023-02-25 DIAGNOSIS — F1290 Cannabis use, unspecified, uncomplicated: Secondary | ICD-10-CM | POA: Diagnosis present

## 2023-02-25 MED ORDER — HALOPERIDOL LACTATE 5 MG/ML IJ SOLN: 5.0000 mg | Freq: Three times a day (TID) | INTRAMUSCULAR | Status: DC | PRN

## 2023-02-25 MED ORDER — OLANZAPINE 5 MG PO TABS: 5.0000 mg | ORAL_TABLET | Freq: Two times a day (BID) | ORAL | Status: DC

## 2023-02-25 MED ORDER — HALOPERIDOL 5 MG PO TABS: 5.0000 mg | ORAL_TABLET | Freq: Three times a day (TID) | ORAL | Status: DC | PRN

## 2023-02-25 MED ORDER — HYDROXYZINE HCL 25 MG PO TABS
25.0000 mg | ORAL_TABLET | Freq: Three times a day (TID) | ORAL | Status: DC | PRN
  Administered 2023-02-25 – 2023-03-02 (×9): 25 mg via ORAL
  Filled 2023-02-25 (×9): qty 1

## 2023-02-25 MED ORDER — GABAPENTIN 300 MG PO CAPS
300.0000 mg | ORAL_CAPSULE | Freq: Three times a day (TID) | ORAL | Status: DC
  Administered 2023-02-25 (×2): 300 mg via ORAL
  Filled 2023-02-25 (×2): qty 1

## 2023-02-25 MED ORDER — ACETAMINOPHEN 325 MG PO TABS
650.0000 mg | ORAL_TABLET | Freq: Four times a day (QID) | ORAL | Status: DC | PRN
  Administered 2023-02-27 – 2023-03-05 (×4): 650 mg via ORAL
  Filled 2023-02-25 (×3): qty 2

## 2023-02-25 MED ORDER — DIPHENHYDRAMINE HCL 50 MG/ML IJ SOLN: 50.0000 mg | Freq: Three times a day (TID) | INTRAMUSCULAR | Status: DC | PRN

## 2023-02-25 MED ORDER — OLANZAPINE 5 MG PO TABS
5.0000 mg | ORAL_TABLET | Freq: Two times a day (BID) | ORAL | Status: DC
  Administered 2023-02-25: 5 mg via ORAL
  Filled 2023-02-25 (×6): qty 1

## 2023-02-25 MED ORDER — ALUM & MAG HYDROXIDE-SIMETH 200-200-20 MG/5ML PO SUSP: 30.0000 mL | ORAL | Status: DC | PRN

## 2023-02-25 MED ORDER — GABAPENTIN 300 MG PO CAPS
300.0000 mg | ORAL_CAPSULE | Freq: Three times a day (TID) | ORAL | Status: DC
  Administered 2023-02-25 – 2023-02-26 (×2): 300 mg via ORAL
  Filled 2023-02-25 (×7): qty 1

## 2023-02-25 MED ORDER — MAGNESIUM HYDROXIDE 400 MG/5ML PO SUSP
30.0000 mL | Freq: Every day | ORAL | Status: DC | PRN
  Administered 2023-03-05: 30 mL via ORAL
  Filled 2023-02-25: qty 30

## 2023-02-25 MED ORDER — TRAZODONE HCL 50 MG PO TABS
50.0000 mg | ORAL_TABLET | Freq: Every evening | ORAL | Status: DC | PRN
  Administered 2023-02-25 – 2023-02-27 (×3): 50 mg via ORAL
  Filled 2023-02-25 (×3): qty 1

## 2023-02-25 MED ORDER — LORAZEPAM 1 MG PO TABS: 2.0000 mg | ORAL_TABLET | Freq: Three times a day (TID) | ORAL | Status: DC | PRN

## 2023-02-25 MED ORDER — LORAZEPAM 2 MG/ML IJ SOLN: 2.0000 mg | Freq: Three times a day (TID) | INTRAMUSCULAR | Status: DC | PRN

## 2023-02-25 MED ORDER — OLANZAPINE 5 MG PO TABS
5.0000 mg | ORAL_TABLET | Freq: Two times a day (BID) | ORAL | Status: DC
  Administered 2023-02-25: 5 mg via ORAL
  Filled 2023-02-25: qty 1

## 2023-02-25 MED ORDER — DIPHENHYDRAMINE HCL 25 MG PO CAPS: 50.0000 mg | ORAL_CAPSULE | Freq: Three times a day (TID) | ORAL | Status: DC | PRN

## 2023-02-25 NOTE — Group Note (Signed)
Date:  02/25/2023 Time:  8:49 PM  Group Topic/Focus:  Wrap-Up Group:   The focus of this group is to help patients review their daily goal of treatment and discuss progress on daily workbooks.    Participation Level:  Did Not Attend    Scot Dock 02/25/2023, 8:49 PM

## 2023-02-25 NOTE — Progress Notes (Signed)
Patient has been asleep since shift change. He did get up to take his medications and received a snack. He voiced no complaints, snacks offered but he declined. Support given and safety maintained with 15 min checks  02/25/23 2130  Psych Admission Type (Psych Patients Only)  Admission Status Voluntary  Psychosocial Assessment  Patient Complaints Self-harm thoughts  Eye Contact Brief  Facial Expression Flat  Affect Flat  Speech Logical/coherent  Interaction Isolative  Motor Activity Slow  Appearance/Hygiene Unremarkable  Behavior Characteristics Appropriate to situation;Cooperative  Mood Depressed;Pleasant  Thought Process  Coherency WDL  Content WDL  Delusions None reported or observed  Perception WDL  Hallucination None reported or observed  Judgment Poor  Confusion None  Danger to Self  Current suicidal ideation? Denies  Danger to Others  Danger to Others None reported or observed

## 2023-02-25 NOTE — ED Notes (Signed)
Patient A&Ox4. Patient continues to endorse SI with no plan or intent. Patient denies HI and AVH at this time. Patient denies any physical complaints when asked. No acute distress noted. Support and encouragement provided. Routine safety checks conducted according to facility protocol. Encouraged patient to notify staff if thoughts of harm toward self or others arise. Patient verbalize understanding and agreement. Will continue to monitor for safety.

## 2023-02-25 NOTE — BH Assessment (Signed)
Comprehensive Clinical Assessment (CCA) Note  02/25/2023 William Romero [place] [id number]  Disposition: Cecilio Asper, NP, recommends continuous observation for safety and stabilization with psych reassessment in the AM.   The patient demonstrates the following risk factors for suicide: Chronic risk factors for suicide include: psychiatric disorder of hx of schizophrenia and substance use disorder. Acute risk factors for suicide include: family or marital conflict, unemployment, social withdrawal/isolation, and loss (financial, interpersonal, professional). Protective factors for this patient include: hope for the future. Considering these factors, the overall suicide risk at this point appears to be moderate. Patient is not appropriate for outpatient follow up.  William Romero is a 42 year old male presenting as a voluntary walk-in due to SI and paranoia. Patient denied HI and alcohol usage. Per chart, past psychiatric history of schizophrenia, ADHD, psychosis and cannabis usage. Patient reports SI with no plan. Patient reports feelings that someone is out to get him. Patient reports daily voices are threatening his life. Patient reports hearing voices telling him bad things about himself, no specifics given. Patient reported voices "make fun of my thoughts". Patient reports stressors include, homelessness, voices, financial and lack of support. Patient reports worsening depressive symptoms. Patient denied prior suicide attempts and self-harming behaviors. Patient reported poor sleep and normal appetite.   Patient denied receiving any outpatient mental health services. Patient denied taking any psych medications at this time and stating he was given psych medications after being discharged from [facility] on 02/22/23 after being there for 8 days, however he misplaced them. Patient reports history of several psych inpatient stays. Patient was then seen at [facility] on 02/23/23 and held for continuous observation and  discharged with outpatient resources for follow up.   Patient reports he is currently homeless. Patient reports being estranged from family and that he has no support system. Patient is currently unemployed. Patient denied access to guns. Patient was calm and cooperative during assessment.  Patient feels that he would not be safe if discharged.  Chief Complaint:  Chief Complaint  Patient presents with   Paranoid   Suicidal   Visit Diagnosis: Major depression    CCA Screening, Triage and Referral (STR)  Patient Reported Information How did you hear about [place]? Legal System  What Is the Reason for Your Visit/Call Today? Pt presents to [facility] voluntarily. Pt reports having paranoia andthoughts of wanting to harm himself. Pt does endorse SI with no plan. Pt denies HI. Pt does endorse auditory hallucinations. Pt is routine.  How Long Has This Been Causing You Problems? <Week  What Do You Feel Would Help You the Most Today? Treatment for Depression or other mood problem   Have You Recently Had Any Thoughts About Hurting Yourself? Yes  Are You Planning to Commit Suicide/Harm Yourself At This time? No   Flowsheet Row ED from 02/24/2023 in [facility] Most recent reading at 02/24/2023 11:54 PM ED from 02/24/2023 in [facility] Emergency Department at [facility] Most recent reading at 02/24/2023 10:21 AM ED from 02/23/2023 in [facility] Most recent reading at 02/23/2023  5:35 AM  C-SSRS RISK CATEGORY High Risk No Risk No Risk       Have you Recently Had Thoughts About Hurting Someone [name]? No  Are You Planning to Harm Someone at This Time? No  Explanation: n/a   Have You Used Any Alcohol or Drugs in the Past 24 Hours? No  What Did You Use and How Much? n/a   Do  You Currently Have a Therapist/Psychiatrist? No  Name of Therapist/Psychiatrist: Name of Therapist/Psychiatrist: n/a   Have You Been Recently Discharged  From Any Office Practice or Programs? Yes  Explanation of Discharge From Practice/Program: [facility] 02/22/23     CCA Screening Triage Referral Assessment Type of Contact: Face-to-Face  Telemedicine Service Delivery:   Is this Initial or Reassessment?   Date Telepsych consult ordered in [facility]:    Time Telepsych consult ordered in [facility]:    Location of Assessment: [facility] Assessment Services  Provider Location: [facility] Assessment Services   Collateral Involvement: none reported   Does Patient Have a Automotive engineer Guardian? No  Legal Guardian Contact Information: n/a  Copy of Legal Guardianship Form: -- (n/a)  Legal Guardian Notified of Arrival: -- (n/a)  Legal Guardian Notified of Pending Discharge: -- (n/a)  If Minor and Not Living with Parent(s), Who has Custody? n/a  Is CPS involved or ever been involved? Never  Is APS involved or ever been involved? Never   Patient Determined To Be At Risk for Harm To Self or Others Based on Review of Patient Reported Information or Presenting Complaint? Yes, for Self-Harm  Method: No Plan  Availability of Means: No access or NA  Intent: Vague intent or NA  Notification Required: No need or identified person  Additional Information for Danger to Others Potential: -- (n/a)  Additional Comments for Danger to Others Potential: n/a  Are There Guns or Other Weapons in Your Home? No  Types of Guns/Weapons: n/a  Are These Weapons Safely Secured?                            No  Who Could Verify You Are Able To Have These Secured: n/a  Do You Have any Outstanding Charges, Pending Court Dates, Parole/Probation? none reported  Contacted To Inform of Risk of Harm To Self or Others: Other: Comment    Does Patient Present under Involuntary Commitment? No    [place] of Residence: [place]   Patient Currently Receiving the Following Services: Not Receiving Services   Determination of Need: Urgent (48 hours)   Options For  Referral: Medication Management; Inpatient Hospitalization; Outpatient Therapy     CCA Biopsychosocial Patient Reported Schizophrenia/Schizoaffective Diagnosis in Past: No   Strengths: Unknown.   Mental Health Symptoms Depression:   Hopelessness; Worthlessness; Fatigue   Duration of Depressive symptoms:  Duration of Depressive Symptoms: Greater than two weeks   Mania:   None   Anxiety:    Worrying; Sleep   Psychosis:   Hallucinations (paranoia)   Duration of Psychotic symptoms:  Duration of Psychotic Symptoms: Less than six months   Trauma:   None   Obsessions:   None   Compulsions:   None   Inattention:   None   Hyperactivity/Impulsivity:   None   Oppositional/Defiant Behaviors:   None   Emotional Irregularity:   None   Other Mood/Personality Symptoms:   N/A    Mental Status Exam Appearance and self-care  Stature:   Tall   Weight:   Average weight   Clothing:   Disheveled   Grooming:   Neglected   Cosmetic use:   None   Posture/gait:   Normal   Motor activity:   Not Remarkable   Sensorium  Attention:   Confused; Normal   Concentration:   Normal   Orientation:   Object; Person; Place; Situation   Recall/memory:   Normal   Affect  and Mood  Affect:   Flat   Mood:   Depressed; Worthless; Hopeless   Relating  Eye contact:   Normal   Facial expression:   Responsive; Depressed   Attitude toward examiner:   Cooperative   Thought and Language  Speech flow:  Slow; Soft   Thought content:   Appropriate to Mood and Circumstances   Preoccupation:   None   Hallucinations:   Auditory   Organization:   Patent examiner of Knowledge:   Average   Intelligence:   Average   Abstraction:   Normal   Judgement:   Fair   Dance movement psychotherapist:   Adequate   Insight:   Lacking   Decision Making:   Normal   Social Functioning  Social Maturity:   Isolates   Social Judgement:    "Chief of Staff"   Stress  Stressors:   Family conflict; Housing   Coping Ability:   Contractor Deficits:   None   Supports:   Support needed     Religion: Religion/Spirituality Are You A Religious Person?: No How Might This Affect Treatment?: N/A  Leisure/Recreation: Leisure / Recreation Do You Have Hobbies?: Yes Leisure and Hobbies: Refurbishing items.  Exercise/Diet: Exercise/Diet Do You Exercise?: No Have You Gained or Lost A Significant Amount of Weight in the Past Six Months?: No Do You Follow a Special Diet?: No Do You Have Any Trouble Sleeping?: Yes Explanation of Sleeping Difficulties: Pt reports poor sleep.   CCA Employment/Education Employment/Work Situation: Employment / Work Situation Employment Situation: Unemployed Patient's Job has Been Impacted by Current Illness: No Has Patient ever Been in Equities trader?: No  Education: Education Is Patient Currently Attending School?: No Last Grade Completed: 12 Did You Product manager?: Yes What Type of College Degree Do you Have?: Two years of college Did You Have An Individualized Education Program (IIEP): No Did You Have Any Difficulty At School?: No Patient's Education Has Been Impacted by Current Illness: No   CCA Family/Childhood History Family and Relationship History: Family history Marital status: Single Does patient have children?: No  Childhood History:  Childhood History By whom was/is the patient raised?: Both parents Description of patient's current relationship with siblings: Pt reports strained relationship with family. Did patient suffer any verbal/emotional/physical/sexual abuse as a child?: No Did patient suffer from severe childhood neglect?: No Has patient ever been sexually abused/assaulted/raped as an adolescent or adult?: No Was the patient ever a victim of a crime or a disaster?: No Witnessed domestic violence?: No Has patient been affected by domestic violence as  an adult?: No       CCA Substance Use Alcohol/Drug Use: Alcohol / Drug Use Pain Medications: See MAR Prescriptions: See MAR Over the Counter: See MAR History of alcohol / drug use?: Yes (THC.) Longest period of sobriety (when/how long): N/A Negative Consequences of Use:  (N/A) Withdrawal Symptoms:  (N/A)                         ASAM's:  Six Dimensions of Multidimensional Assessment  Dimension 1:  Acute Intoxication and/or Withdrawal Potential:   Dimension 1:  Description of individual's past and current experiences of substance use and withdrawal: n/a  Dimension 2:  Biomedical Conditions and Complications:   Dimension 2:  Description of patient's biomedical conditions and  complications: n/a  Dimension 3:  Emotional, Behavioral, or Cognitive Conditions and Complications:  Dimension 3:  Description of emotional, behavioral, or  cognitive conditions and complications: n/a  Dimension 4:  Readiness to Change:  Dimension 4:  Description of Readiness to Change criteria: n/a  Dimension 5:  Relapse, Continued use, or Continued Problem Potential:  Dimension 5:  Relapse, continued use, or continued problem potential critiera description: n/a  Dimension 6:  Recovery/Living Environment:  Dimension 6:  Recovery/Iiving environment criteria description: n/a  ASAM Severity Score:    ASAM Recommended Level of Treatment: ASAM Recommended Level of Treatment:  (n/a)   Substance use Disorder (SUD) Substance Use Disorder (SUD)  Checklist Symptoms of Substance Use:  (n/a)  Recommendations for Services/Supports/Treatments: Recommendations for Services/Supports/Treatments Recommendations For Services/Supports/Treatments: Medication Management, Individual Therapy, Inpatient Hospitalization, Other (Comment)  Discharge Disposition:    DSM5 Diagnoses: Patient Active Problem List   Diagnosis Date Noted   Tobacco use disorder 02/16/2023   Cannabis use disorder 02/16/2023   Schizophreniform  psychosis (HCC) 02/15/2023   Schizophrenia (HCC) 11/01/2022   ADHD 03/28/2019     Referrals to Alternative Service(s): Referred to Alternative Service(s):   Place:   Date:   Time:    Referred to Alternative Service(s):   Place:   Date:   Time:    Referred to Alternative Service(s):   Place:   Date:   Time:    Referred to Alternative Service(s):   Place:   Date:   Time:     Burnetta Sabin, [facility]

## 2023-02-25 NOTE — ED Notes (Signed)
Pt sleeping at present, no distress noted.  Monitoring for safety. 

## 2023-02-25 NOTE — ED Notes (Signed)
Pt was offered a meal this morning, will continue to monitor for safety.  

## 2023-02-25 NOTE — ED Notes (Signed)
Patient resting quietly in bed with eyes closed. Respirations equal and unlabored, skin warm and dry, NAD. Routine safety checks conducted according to facility protocol. Will continue to monitor for safety.  

## 2023-02-25 NOTE — ED Notes (Signed)
Patient refused RisperiDone 3 mg Po. Patient states he want to talk to doctor about medication. He states "I think that's what making me paranoid". Encouragement and support provided and safety maintain.

## 2023-02-25 NOTE — ED Provider Notes (Signed)
[facility] Continuous Assessment Admission H&P  Date: 02/25/23 Patient Name: William Romero MRN: [id number] Chief Complaint:    Diagnoses:  Final diagnoses:  Paranoia [facility])    HPI: William Romero is a 42 year old with a psychiatric history of ADHD, schizophreniform, psychosis, cannabis abuse, and MDD.  Patient presented voluntarily to [facility] reporting worsening paranoia and hallucination.  Patient was seen face-to-face and his chart was reviewed by this nurse practitioner.  Per chart, patient was admitted [facility] inpatient [facility] due to psychosis from 06/11 to 02/22/2023. He medications were adjusted and was discharged on Hadol 5mg  BID, gabapentin 300mg  TID, atarax 25mg  TIP prn, and propranolo 10 TID, trazodone 50mg .  Post discharge from [facility] patient was evaluated at [facility] and admitted for overnight observation he was discharged on 02/24/2023 with resources and plans to follow-up with outpatient services.  Patient was also evaluated at [facility] earlier today.   On evaluation, patient is alert and oriented x 4.  His speech is clear and coherent.  Patient's mood is anxious with a congruent affect.  His thought process is linear; he did not appear to be responding to any internal/external stimuli or appears to be experiencing any delusional thought content  Patient reports he has been experiencing increased hallucinations and paranoia since his medications were adjusted while inpat at [facility].  He reports hearing voices that are telling him bad things about himself and voices that are making fun of his thoughts.  He says that he is paranoid and reports "people are out to get me."  He says that he is compliant with his medication but feels that his medications are not effective.  Patient reports feeling depressed and experiencing suicidal ideation.  He endorses depressive symptoms of hopelessness, worthlessness, increased  anxiety, poor concentration, irritability, and poor sleep.  He denies suicidal plan however he says that he is unable to contract for safety.  He denies recent substance use although his UDS was positive for THC.  He reports that he is currently homeless and does not have outpatient psychiatric services  Total Time spent with patient: 30 minutes  Musculoskeletal  Strength & Muscle Tone: within normal limits Gait & Station: normal Patient leans: Right  Psychiatric Specialty Exam  Presentation General Appearance:  Appropriate for Environment  Eye Contact: Good  Speech: Clear and Coherent  Speech Volume: Normal  Handedness: Right   Mood and Affect  Mood: Anxious  Affect: Congruent   Thought Process  Thought Processes: Coherent  Descriptions of Associations:Intact  Orientation:Full (Time, Place and Person)  Thought Content:Paranoid Ideation  Diagnosis of Schizophrenia or Schizoaffective disorder in past: No  Duration of Psychotic Symptoms: Less than six months  Hallucinations:Hallucinations: Visual Description of Auditory Hallucinations: "Voices that are making fun of my thought and challenging my thinkin"  Ideas of Reference:Paranoia  Suicidal Thoughts:Suicidal Thoughts: Yes, Passive SI Passive Intent and/or Plan: Without Plan  Homicidal Thoughts:Homicidal Thoughts: Yes, Passive HI Passive Intent and/or Plan: Without Plan   Sensorium  Memory: Immediate Good; Recent Fair; Remote Fair  Judgment: Fair  Insight: Fair   Chartered certified accountant: Fair  Attention Span: Fair  Recall: Good  Fund of Knowledge: Fair  Language: Good   Psychomotor Activity  Psychomotor Activity: Psychomotor Activity: Normal   Assets  Assets: Communication Skills; Desire for Improvement; Physical Health   Sleep  Sleep: Sleep: Fair Number of Hours of Sleep: 6   Nutritional Assessment (For OBS and FBC admissions only) Has  the patient had a  weight loss or gain of 10 pounds or more in the last 3 months?: No Has the patient had a decrease in food intake/or appetite?: No Does the patient have dental problems?: No Does the patient have eating habits or behaviors that may be indicators of an eating disorder including binging or inducing vomiting?: No Has the patient recently lost weight without trying?: 0 Has the patient been eating poorly because of a decreased appetite?: 0 Malnutrition Screening Tool Score: 0    Physical Exam Vitals and nursing note reviewed.  Constitutional:      General: He is not in acute distress.    Appearance: He is well-developed.  HENT:     Head: Normocephalic and atraumatic.  Eyes:     Conjunctiva/sclera: Conjunctivae normal.  Cardiovascular:     Rate and Rhythm: Normal rate.  Pulmonary:     Effort: Pulmonary effort is normal. No respiratory distress.  Abdominal:     Palpations: Abdomen is soft.     Tenderness: There is no abdominal tenderness.  Musculoskeletal:        General: No swelling.     Cervical back: Neck supple.  Skin:    General: Skin is warm and dry.     Capillary Refill: Capillary refill takes less than 2 seconds.  Neurological:     Mental Status: He is alert.  Psychiatric:        Attention and Perception: Attention normal. He perceives auditory hallucinations.        Mood and Affect: Mood is anxious and depressed.        Speech: Speech normal.        Behavior: Behavior normal. Behavior is cooperative.        Thought Content: Thought content is paranoid. Thought content is not delusional. Thought content includes suicidal ideation. Thought content does not include homicidal ideation. Thought content does not include homicidal or suicidal plan.        Cognition and Memory: Cognition normal.    Review of Systems  Constitutional: Negative.   HENT: Negative.    Eyes: Negative.   Respiratory: Negative.    Cardiovascular: Negative.   Gastrointestinal: Negative.    Genitourinary: Negative.   Musculoskeletal: Negative.   Skin: Negative.   Neurological: Negative.   Endo/Heme/Allergies: Negative.   Psychiatric/Behavioral:  Positive for hallucinations and substance abuse. The patient is nervous/anxious.     Blood pressure 107/71, pulse 95, temperature 98.8 F (37.1 C), temperature source Oral, resp. rate 20, SpO2 96 %. There is no height or weight on file to calculate BMI.  Past Psychiatric History:  ADHD, schizophreniform, psychosis, cannabis abuse, and MDD  Is the patient at risk to self? Yes  Has the patient been a risk to self in the past 6 months? Yes .    Has the patient been a risk to self within the distant past? Yes   Is the patient a risk to others? No   Has the patient been a risk to others in the past 6 months? No   Has the patient been a risk to others within the distant past? No   Past Medical History:  Past Medical History:  Diagnosis Date   Schizophrenia (HCC)      Family History: none reported by patient   Social History:  Social History   Tobacco Use   Smoking status: Former    Packs/day: .5    Types: Cigarettes   Smokeless tobacco: Never  Vaping Use  Vaping Use: Never used  Substance Use Topics   Alcohol use: Not Currently    Alcohol/week: 5.0 standard drinks of alcohol    Types: 5 Cans of beer per week   Drug use: No     Last Labs:  Admission on 02/24/2023, Discharged on 02/24/2023  Component Date Value Ref Range Status   Sodium 02/24/2023 141  135 - 145 mmol/L Final   Potassium 02/24/2023 3.8  3.5 - 5.1 mmol/L Final   Chloride 02/24/2023 104  98 - 111 mmol/L Final   BUN 02/24/2023 12  6 - 20 mg/dL Final   Creatinine, Ser 02/24/2023 1.10  0.61 - 1.24 mg/dL Final   Glucose, Bld 40/98/1191 83  70 - 99 mg/dL Final   Glucose reference range applies only to samples taken after fasting for at least 8 hours.   Calcium, Ion 02/24/2023 1.14 (L)  1.15 - 1.40 mmol/L Final   TCO2 02/24/2023 25  22 - 32 mmol/L  Final   Hemoglobin 02/24/2023 14.6  13.0 - 17.0 g/dL Final   HCT 47/82/9562 43.0  39.0 - 52.0 % Final   Alcohol, Ethyl (B) 02/24/2023 <10  <10 mg/dL Final   Comment: (NOTE) Lowest detectable limit for serum alcohol is 10 mg/dL.  For medical purposes only. Performed at [facility], [address] [place], [place] [address]    Prothrombin Time 02/24/2023 13.4  11.4 - 15.2 seconds Final   INR 02/24/2023 1.0  0.8 - 1.2 Final   Comment: (NOTE) INR goal varies based on device and disease states. Performed at [facility], [address] [place], [place] [address]    aPTT 02/24/2023 28  24 - 36 seconds Final   Performed at [facility], [address] [place], [place] [address]   WBC 02/24/2023 6.9  4.0 - 10.5 K/uL Final   RBC 02/24/2023 4.58  4.22 - 5.81 MIL/uL Final   Hemoglobin 02/24/2023 13.6  13.0 - 17.0 g/dL Final   HCT 28/41/3244 42.0  39.0 - 52.0 % Final   MCV 02/24/2023 91.7  80.0 - 100.0 fL Final   MCH 02/24/2023 29.7  26.0 - 34.0 pg Final   MCHC 02/24/2023 32.4  30.0 - 36.0 g/dL Final   RDW 09/07/7251 14.2  11.5 - 15.5 % Final   Platelets 02/24/2023 297  150 - 400 K/uL Final   nRBC 02/24/2023 0.0  0.0 - 0.2 % Final   Performed at [facility], [address], [place], [place] [address]   Neutrophils Relative % 02/24/2023 72  % Final   Neutro Abs 02/24/2023 5.0  1.7 - 7.7 K/uL Final   Lymphocytes Relative 02/24/2023 17  % Final   Lymphs Abs 02/24/2023 1.2  0.7 - 4.0 K/uL Final   Monocytes Relative 02/24/2023 7  % Final   Monocytes Absolute 02/24/2023 0.5  0.1 - 1.0 K/uL Final   Eosinophils Relative 02/24/2023 3  % Final   Eosinophils Absolute 02/24/2023 0.2  0.0 - 0.5 K/uL Final   Basophils Relative 02/24/2023 1  % Final   Basophils Absolute 02/24/2023 0.1  0.0 - 0.1 K/uL Final   Immature Granulocytes 02/24/2023 0  % Final   Abs Immature Granulocytes 02/24/2023 0.02  0.00 - 0.07 K/uL Final    Performed at [facility], [address] [place], [place] [address]   Sodium 02/24/2023 137  135 - 145 mmol/L Final   Potassium 02/24/2023 3.6  3.5 - 5.1 mmol/L Final  Chloride 02/24/2023 106  98 - 111 mmol/L Final   CO2 02/24/2023 24  22 - 32 mmol/L Final   Glucose, Bld 02/24/2023 86  70 - 99 mg/dL Final   Glucose reference range applies only to samples taken after fasting for at least 8 hours.   BUN 02/24/2023 13  6 - 20 mg/dL Final   Creatinine, Ser 02/24/2023 1.09  0.61 - 1.24 mg/dL Final   Calcium 78/29/5621 8.4 (L)  8.9 - 10.3 mg/dL Final   Total Protein 30/86/5784 7.3  6.5 - 8.1 g/dL Final   Albumin 69/62/9528 4.3  3.5 - 5.0 g/dL Final   AST 41/32/4401 36  15 - 41 U/L Final   ALT 02/24/2023 52 (H)  0 - 44 U/L Final   Alkaline Phosphatase 02/24/2023 95  38 - 126 U/L Final   Total Bilirubin 02/24/2023 0.9  0.3 - 1.2 mg/dL Final   GFR, Estimated 02/24/2023 >60  >60 mL/min Final   Comment: (NOTE) Calculated using the CKD-EPI Creatinine Equation (2021)    Anion gap 02/24/2023 7  5 - 15 Final   Performed at [facility], [address] [place], [place] [address]   Opiates 02/24/2023 NONE DETECTED  NONE DETECTED Final   Cocaine 02/24/2023 NONE DETECTED  NONE DETECTED Final   Benzodiazepines 02/24/2023 NONE DETECTED  NONE DETECTED Final   Amphetamines 02/24/2023 NONE DETECTED  NONE DETECTED Final   Tetrahydrocannabinol 02/24/2023 POSITIVE (A)  NONE DETECTED Final   Barbiturates 02/24/2023 NONE DETECTED  NONE DETECTED Final   Comment: (NOTE) DRUG SCREEN FOR MEDICAL PURPOSES ONLY.  IF CONFIRMATION IS NEEDED FOR ANY PURPOSE, NOTIFY LAB WITHIN 5 DAYS.  LOWEST DETECTABLE LIMITS FOR URINE DRUG SCREEN Drug Class                     Cutoff (ng/mL) Amphetamine and metabolites    1000 Barbiturate and metabolites    200 Benzodiazepine                 200 Opiates and metabolites        300 Cocaine and metabolites        300 THC                             50 Performed at [facility], [address] [place], [place] [address]    Color, Urine 02/24/2023 YELLOW  YELLOW Final   APPearance 02/24/2023 CLEAR  CLEAR Final   Specific Gravity, Urine 02/24/2023 1.019  1.005 - 1.030 Final   pH 02/24/2023 6.0  5.0 - 8.0 Final   Glucose, UA 02/24/2023 NEGATIVE  NEGATIVE mg/dL Final   Hgb urine dipstick 02/24/2023 NEGATIVE  NEGATIVE Final   Bilirubin Urine 02/24/2023 NEGATIVE  NEGATIVE Final   Ketones, ur 02/24/2023 NEGATIVE  NEGATIVE mg/dL Final   Protein, ur 03/47/4259 NEGATIVE  NEGATIVE mg/dL Final   Nitrite 56/38/7564 NEGATIVE  NEGATIVE Final   Leukocytes,Ua 02/24/2023 NEGATIVE  NEGATIVE Final   Performed at [facility], [address], [place], [place] [address]  Admission on 02/23/2023, Discharged on 02/23/2023  Component Date Value Ref Range Status   Alcohol, Ethyl (B) 02/23/2023 <10  <10 mg/dL Final   Comment: (NOTE) Lowest detectable limit for serum alcohol is 10 mg/dL.  For medical purposes only. Performed at [facility], [address] [place], [place] [address]    POC Amphetamine UR 02/23/2023 None Detected  NONE DETECTED (Cut Off Level 1000 ng/mL) Final   POC Secobarbital (BAR) 02/23/2023 None Detected  NONE DETECTED (Cut Off Level 300 ng/mL) Final   POC Buprenorphine (BUP) 02/23/2023 None Detected  NONE DETECTED (Cut Off Level 10 ng/mL) Final   POC Oxazepam (BZO) 02/23/2023 None Detected  NONE DETECTED (Cut Off Level 300 ng/mL) Final   POC Cocaine UR 02/23/2023 None Detected  NONE DETECTED (Cut Off Level 300 ng/mL) Final   POC Methamphetamine UR 02/23/2023 None Detected  NONE DETECTED (Cut Off Level 1000 ng/mL) Final   POC Morphine 02/23/2023 None Detected  NONE DETECTED (Cut Off Level 300 ng/mL) Final   POC Methadone UR 02/23/2023 None Detected  NONE DETECTED (Cut Off Level 300 ng/mL) Final   POC Oxycodone UR 02/23/2023 None Detected  NONE DETECTED (Cut Off Level 100 ng/mL)  Final   POC Marijuana UR 02/23/2023 Positive (A)  NONE DETECTED (Cut Off Level 50 ng/mL) Final  Admission on 02/13/2023, Discharged on 02/14/2023  Component Date Value Ref Range Status   WBC 02/13/2023 6.4  4.0 - 10.5 K/uL Final   RBC 02/13/2023 4.11 (L)  4.22 - 5.81 MIL/uL Final   Hemoglobin 02/13/2023 12.2 (L)  13.0 - 17.0 g/dL Final   HCT 95/62/1308 37.9 (L)  39.0 - 52.0 % Final   MCV 02/13/2023 92.2  80.0 - 100.0 fL Final   MCH 02/13/2023 29.7  26.0 - 34.0 pg Final   MCHC 02/13/2023 32.2  30.0 - 36.0 g/dL Final   RDW 65/78/4696 14.0  11.5 - 15.5 % Final   Platelets 02/13/2023 329  150 - 400 K/uL Final   nRBC 02/13/2023 0.0  0.0 - 0.2 % Final   Neutrophils Relative % 02/13/2023 73  % Final   Neutro Abs 02/13/2023 4.7  1.7 - 7.7 K/uL Final   Lymphocytes Relative 02/13/2023 15  % Final   Lymphs Abs 02/13/2023 0.9  0.7 - 4.0 K/uL Final   Monocytes Relative 02/13/2023 7  % Final   Monocytes Absolute 02/13/2023 0.5  0.1 - 1.0 K/uL Final   Eosinophils Relative 02/13/2023 3  % Final   Eosinophils Absolute 02/13/2023 0.2  0.0 - 0.5 K/uL Final   Basophils Relative 02/13/2023 1  % Final   Basophils Absolute 02/13/2023 0.1  0.0 - 0.1 K/uL Final   Immature Granulocytes 02/13/2023 1  % Final   Abs Immature Granulocytes 02/13/2023 0.03  0.00 - 0.07 K/uL Final   Performed at [facility], [address], [place], [place] [address]   Sodium 02/13/2023 142  135 - 145 mmol/L Final   Potassium 02/13/2023 4.2  3.5 - 5.1 mmol/L Final   Chloride 02/13/2023 107  98 - 111 mmol/L Final   CO2 02/13/2023 23  22 - 32 mmol/L Final   Glucose, Bld 02/13/2023 139 (H)  70 - 99 mg/dL Final   Glucose reference range applies only to samples taken after fasting for at least 8 hours.   BUN 02/13/2023 13  6 - 20 mg/dL Final   Creatinine, Ser 02/13/2023 0.95  0.61 - 1.24 mg/dL Final   Calcium 41/32/4401 8.8 (L)  8.9 - 10.3 mg/dL Final   Total Protein 02/72/5366 6.5  6.5 - 8.1 g/dL Final   Albumin 44/11/4740  3.5  3.5 - 5.0 g/dL Final   AST 59/56/3875 31  15 - 41 U/L Final   ALT 02/13/2023 24  0 - 44 U/L Final   Alkaline Phosphatase 02/13/2023 76  38 - 126 U/L Final  Total Bilirubin 02/13/2023 0.3  0.3 - 1.2 mg/dL Final   GFR, Estimated 02/13/2023 >60  >60 mL/min Final   Comment: (NOTE) Calculated using the CKD-EPI Creatinine Equation (2021)    Anion gap 02/13/2023 12  5 - 15 Final   Performed at [facility], [address] [place], [place] [address]   Hgb A1c MFr Bld 02/13/2023 5.0  4.8 - 5.6 % Final   Comment: (NOTE) Pre diabetes:          5.7%-6.4%  Diabetes:              >6.4%  Glycemic control for   <7.0% adults with diabetes    Mean Plasma Glucose 02/13/2023 96.8  mg/dL Final   Performed at [facility], [address], [place], [place] [address]   Magnesium 02/13/2023 2.3  1.7 - 2.4 mg/dL Final   Performed at [facility], [address], [place], [place] [address]   Alcohol, Ethyl (B) 02/13/2023 <10  <10 mg/dL Final   Comment: (NOTE) Lowest detectable limit for serum alcohol is 10 mg/dL.  For medical purposes only. Performed at [facility], [address], [place], [place] [address]    Cholesterol 02/13/2023 150  0 - 200 mg/dL Final   Triglycerides 29/52/8413 100  <150 mg/dL Final   HDL 24/40/1027 53  >40 mg/dL Final   Total CHOL/HDL Ratio 02/13/2023 2.8  RATIO Final   VLDL 02/13/2023 20  0 - 40 mg/dL Final   LDL Cholesterol 02/13/2023 77  0 - 99 mg/dL Final   Comment:        Total Cholesterol/HDL:CHD Risk Coronary Heart Disease Risk Table                     Men   Women  1/2 Average Risk   3.4   3.3  Average Risk       5.0   4.4  2 X Average Risk   9.6   7.1  3 X Average Risk  23.4   11.0        Use the calculated Patient Ratio above and the CHD Risk Table to determine the patient's CHD Risk.        ATP III CLASSIFICATION (LDL):  <100     mg/dL   Optimal  253-664  mg/dL   Near or Above                    Optimal  130-159  mg/dL    Borderline  403-474  mg/dL   High  >259     mg/dL   Very High Performed at [facility], [address] [place], [place] [address]    TSH 02/13/2023 0.426  0.350 - 4.500 uIU/mL Final   Comment: Performed by a 3rd Generation assay with a functional sensitivity of <=0.01 uIU/mL. Performed at [facility], [address] [place], [place] [address]    POC Amphetamine UR 02/13/2023 None Detected  NONE DETECTED (Cut Off Level 1000 ng/mL) Final   POC Secobarbital (BAR) 02/13/2023 None Detected  NONE DETECTED (Cut Off Level 300 ng/mL) Final   POC Buprenorphine (BUP) 02/13/2023 None Detected  NONE DETECTED (Cut Off Level 10 ng/mL) Final   POC Oxazepam (BZO) 02/13/2023 None Detected  NONE DETECTED (Cut Off Level 300 ng/mL) Final   POC Cocaine UR 02/13/2023 None Detected  NONE DETECTED (Cut Off Level 300 ng/mL) Final   POC  Methamphetamine UR 02/13/2023 None Detected  NONE DETECTED (Cut Off Level 1000 ng/mL) Final   POC Morphine 02/13/2023 None Detected  NONE DETECTED (Cut Off Level 300 ng/mL) Final   POC Methadone UR 02/13/2023 None Detected  NONE DETECTED (Cut Off Level 300 ng/mL) Final   POC Oxycodone UR 02/13/2023 None Detected  NONE DETECTED (Cut Off Level 100 ng/mL) Final   POC Marijuana UR 02/13/2023 Positive (A)  NONE DETECTED (Cut Off Level 50 ng/mL) Final  Admission on 02/08/2023, Discharged on 02/08/2023  Component Date Value Ref Range Status   B Natriuretic Peptide 02/08/2023 16.3  0.0 - 100.0 pg/mL Final   Performed at [facility], [address], [place], [place] [address]   WBC 02/08/2023 3.8 (L)  4.0 - 10.5 K/uL Final   RBC 02/08/2023 3.90 (L)  4.22 - 5.81 MIL/uL Final   Hemoglobin 02/08/2023 11.8 (L)  13.0 - 17.0 g/dL Final   HCT 19/14/7829 34.9 (L)  39.0 - 52.0 % Final   MCV 02/08/2023 89.5  80.0 - 100.0 fL Final   MCH 02/08/2023 30.3  26.0 - 34.0 pg Final   MCHC 02/08/2023 33.8  30.0 - 36.0 g/dL Final   RDW 56/21/3086 13.3  11.5 - 15.5 % Final    Platelets 02/08/2023 324  150 - 400 K/uL Final   nRBC 02/08/2023 0.0  0.0 - 0.2 % Final   Neutrophils Relative % 02/08/2023 48  % Final   Neutro Abs 02/08/2023 1.8  1.7 - 7.7 K/uL Final   Lymphocytes Relative 02/08/2023 32  % Final   Lymphs Abs 02/08/2023 1.2  0.7 - 4.0 K/uL Final   Monocytes Relative 02/08/2023 13  % Final   Monocytes Absolute 02/08/2023 0.5  0.1 - 1.0 K/uL Final   Eosinophils Relative 02/08/2023 6  % Final   Eosinophils Absolute 02/08/2023 0.2  0.0 - 0.5 K/uL Final   Basophils Relative 02/08/2023 1  % Final   Basophils Absolute 02/08/2023 0.0  0.0 - 0.1 K/uL Final   Immature Granulocytes 02/08/2023 0  % Final   Abs Immature Granulocytes 02/08/2023 0.01  0.00 - 0.07 K/uL Final   Performed at [facility], [address], [place], [place] [address]   Sodium 02/08/2023 138  135 - 145 mmol/L Final   Potassium 02/08/2023 3.3 (L)  3.5 - 5.1 mmol/L Final   Chloride 02/08/2023 106  98 - 111 mmol/L Final   CO2 02/08/2023 24  22 - 32 mmol/L Final   Glucose, Bld 02/08/2023 138 (H)  70 - 99 mg/dL Final   Glucose reference range applies only to samples taken after fasting for at least 8 hours.   BUN 02/08/2023 12  6 - 20 mg/dL Final   Creatinine, Ser 02/08/2023 0.96  0.61 - 1.24 mg/dL Final   Calcium 96/29/5284 8.2 (L)  8.9 - 10.3 mg/dL Final   Total Protein 13/24/4010 6.4 (L)  6.5 - 8.1 g/dL Final   Albumin 27/25/3664 3.3 (L)  3.5 - 5.0 g/dL Final   AST 40/34/7425 29  15 - 41 U/L Final   ALT 02/08/2023 21  0 - 44 U/L Final   Alkaline Phosphatase 02/08/2023 70  38 - 126 U/L Final   Total Bilirubin 02/08/2023 0.5  0.3 - 1.2 mg/dL Final   GFR, Estimated 02/08/2023 >60  >60 mL/min Final   Comment: (NOTE) Calculated using the CKD-EPI Creatinine Equation (2021)    Anion gap 02/08/2023 8  5 - 15 Final   Performed at [facility], [address] [place], [place] [address]  Admission on 12/03/2022, Discharged on 12/03/2022  Component Date  Value Ref Range Status   Sodium 12/03/2022 138  135 - 145 mmol/L Final   Potassium 12/03/2022 2.9 (L)  3.5 - 5.1 mmol/L Final   Chloride 12/03/2022 105  98 - 111 mmol/L Final   CO2 12/03/2022 26  22 - 32 mmol/L Final   Glucose, Bld 12/03/2022 124 (H)  70 - 99 mg/dL Final   Glucose reference range applies only to samples taken after fasting for at least 8 hours.   BUN 12/03/2022 10  6 - 20 mg/dL Final   Creatinine, Ser 12/03/2022 0.88  0.61 - 1.24 mg/dL Final   Calcium 40/98/1191 8.6 (L)  8.9 - 10.3 mg/dL Final   Total Protein 47/82/9562 6.8  6.5 - 8.1 g/dL Final   Albumin 13/04/6577 3.7  3.5 - 5.0 g/dL Final   AST 46/96/2952 29  15 - 41 U/L Final   ALT 12/03/2022 23  0 - 44 U/L Final   Alkaline Phosphatase 12/03/2022 67  38 - 126 U/L Final   Total Bilirubin 12/03/2022 0.7  0.3 - 1.2 mg/dL Final   GFR, Estimated 12/03/2022 >60  >60 mL/min Final   Comment: (NOTE) Calculated using the CKD-EPI Creatinine Equation (2021)    Anion gap 12/03/2022 7  5 - 15 Final   Performed at [facility], [address], [place], [place] [address]   Opiates 12/03/2022 NONE DETECTED  NONE DETECTED Final   Cocaine 12/03/2022 NONE DETECTED  NONE DETECTED Final   Benzodiazepines 12/03/2022 NONE DETECTED  NONE DETECTED Final   Amphetamines 12/03/2022 POSITIVE (A)  NONE DETECTED Final   Tetrahydrocannabinol 12/03/2022 POSITIVE (A)  NONE DETECTED Final   Barbiturates 12/03/2022 NONE DETECTED  NONE DETECTED Final   Comment: (NOTE) DRUG SCREEN FOR MEDICAL PURPOSES ONLY.  IF CONFIRMATION IS NEEDED FOR ANY PURPOSE, NOTIFY LAB WITHIN 5 DAYS.  LOWEST DETECTABLE LIMITS FOR URINE DRUG SCREEN Drug Class                     Cutoff (ng/mL) Amphetamine and metabolites    1000 Barbiturate and metabolites    200 Benzodiazepine                 200 Opiates and metabolites        300 Cocaine and metabolites        300 THC                            50 Performed at [facility], [address], [place], [place] [address]    WBC 12/03/2022 5.6  4.0 - 10.5 K/uL Final   RBC 12/03/2022 4.14 (L)  4.22 - 5.81 MIL/uL Final   Hemoglobin 12/03/2022 12.4 (L)  13.0 - 17.0 g/dL Final   HCT 27/25/3664 36.5 (L)  39.0 - 52.0 % Final   MCV 12/03/2022 88.2  80.0 - 100.0 fL Final   MCH 12/03/2022 30.0  26.0 - 34.0 pg Final   MCHC 12/03/2022 34.0  30.0 - 36.0 g/dL Final   RDW 40/34/7425 13.3  11.5 - 15.5 % Final   Platelets 12/03/2022 317  150 - 400 K/uL Final   nRBC 12/03/2022 0.0  0.0 - 0.2 % Final   Neutrophils Relative % 12/03/2022 65  % Final   Neutro Abs 12/03/2022 3.6  1.7 - 7.7 K/uL Final  Lymphocytes Relative 12/03/2022 20  % Final   Lymphs Abs 12/03/2022 1.1  0.7 - 4.0 K/uL Final   Monocytes Relative 12/03/2022 9  % Final   Monocytes Absolute 12/03/2022 0.5  0.1 - 1.0 K/uL Final   Eosinophils Relative 12/03/2022 5  % Final   Eosinophils Absolute 12/03/2022 0.3  0.0 - 0.5 K/uL Final   Basophils Relative 12/03/2022 1  % Final   Basophils Absolute 12/03/2022 0.1  0.0 - 0.1 K/uL Final   Immature Granulocytes 12/03/2022 0  % Final   Abs Immature Granulocytes 12/03/2022 0.01  0.00 - 0.07 K/uL Final   Performed at [facility], [address], [place], [place] [address]  Admission on 09/03/2022, Discharged on 09/04/2022  Component Date Value Ref Range Status   B Natriuretic Peptide 09/03/2022 16.9  0.0 - 100.0 pg/mL Final   Performed at [facility], [address], [place], [place] [address]   Troponin I (High Sensitivity) 09/03/2022 3  <18 ng/L Final   Comment: (NOTE) Elevated high sensitivity troponin I (hsTnI) values and significant  changes across serial measurements may suggest ACS but many other  chronic and acute conditions are known to elevate hsTnI results.  Refer to the "Links" section for chest pain algorithms and additional  guidance. Performed at [facility], [address], [place], [place]  [address]    Sodium 09/03/2022 135  135 - 145 mmol/L Final   Potassium 09/03/2022 3.2 (L)  3.5 - 5.1 mmol/L Final   Chloride 09/03/2022 97 (L)  98 - 111 mmol/L Final   CO2 09/03/2022 26  22 - 32 mmol/L Final   Glucose, Bld 09/03/2022 111 (H)  70 - 99 mg/dL Final   Glucose reference range applies only to samples taken after fasting for at least 8 hours.   BUN 09/03/2022 9  6 - 20 mg/dL Final   Creatinine, Ser 09/03/2022 0.97  0.61 - 1.24 mg/dL Final   Calcium 91/47/8295 8.8 (L)  8.9 - 10.3 mg/dL Final   Total Protein 62/13/0865 8.5 (H)  6.5 - 8.1 g/dL Final   Albumin 78/46/9629 3.9  3.5 - 5.0 g/dL Final   AST 52/84/1324 22  15 - 41 U/L Final   ALT 09/03/2022 22  0 - 44 U/L Final   Alkaline Phosphatase 09/03/2022 79  38 - 126 U/L Final   Total Bilirubin 09/03/2022 0.9  0.3 - 1.2 mg/dL Final   GFR, Estimated 09/03/2022 >60  >60 mL/min Final   Comment: (NOTE) Calculated using the CKD-EPI Creatinine Equation (2021)    Anion gap 09/03/2022 12  5 - 15 Final   Performed at [facility], [address] [place], [place] [address]   SARS Coronavirus 2 by RT PCR 09/03/2022 NEGATIVE  NEGATIVE Final   Comment: (NOTE) SARS-CoV-2 target nucleic acids are NOT DETECTED.  The SARS-CoV-2 RNA is generally detectable in upper respiratory specimens during the acute phase of infection. The lowest concentration of SARS-CoV-2 viral copies this assay can detect is 138 copies/mL. A negative result does not preclude SARS-Cov-2 infection and should not be used as the sole basis for treatment or other patient management decisions. A negative result may occur with  improper specimen collection/handling, submission of specimen other than nasopharyngeal swab, presence of viral mutation(s) within the areas targeted by this assay, and inadequate number of viral copies(<138 copies/mL). A negative result must be combined with clinical observations, patient history, and epidemiological information.  The expected result is Negative.  Fact Sheet for Patients:  BloggerCourse.com  Fact Sheet for Healthcare Providers:  SeriousBroker.it  This test is no                          t yet approved or cleared by the [place] FDA and  has been authorized for detection and/or diagnosis of SARS-CoV-2 by FDA under an Emergency Use Authorization (EUA). This EUA will remain  in effect (meaning this test can be used) for the duration of the COVID-19 declaration under Section 564(b)(1) of the Act, 21 U.S.C.section 360bbb-3(b)(1), unless the authorization is terminated  or revoked sooner.       Influenza A by PCR 09/03/2022 NEGATIVE  NEGATIVE Final   Influenza B by PCR 09/03/2022 NEGATIVE  NEGATIVE Final   Comment: (NOTE) The Xpert Xpress SARS-CoV-2/FLU/RSV plus assay is intended as an aid in the diagnosis of influenza from Nasopharyngeal swab specimens and should not be used as a sole basis for treatment. Nasal washings and aspirates are unacceptable for Xpert Xpress SARS-CoV-2/FLU/RSV testing.  Fact Sheet for Patients: BloggerCourse.com  Fact Sheet for Healthcare Providers: SeriousBroker.it  This test is not yet approved or cleared by the [place] FDA and has been authorized for detection and/or diagnosis of SARS-CoV-2 by FDA under an Emergency Use Authorization (EUA). This EUA will remain in effect (meaning this test can be used) for the duration of the COVID-19 declaration under Section 564(b)(1) of the Act, 21 U.S.C. section 360bbb-3(b)(1), unless the authorization is terminated or revoked.     Resp Syncytial Virus by PCR 09/03/2022 NEGATIVE  NEGATIVE Final   Comment: (NOTE) Fact Sheet for Patients: BloggerCourse.com  Fact Sheet for Healthcare Providers: SeriousBroker.it  This test is not yet approved or cleared by the  [place] FDA and has been authorized for detection and/or diagnosis of SARS-CoV-2 by FDA under an Emergency Use Authorization (EUA). This EUA will remain in effect (meaning this test can be used) for the duration of the COVID-19 declaration under Section 564(b)(1) of the Act, 21 U.S.C. section 360bbb-3(b)(1), unless the authorization is terminated or revoked.  Performed at [facility], [address], [place], [place] [address]     Allergies: Patient has no known allergies.  Medications:  Facility Ordered Medications  Medication   acetaminophen (TYLENOL) tablet 650 mg   alum & mag hydroxide-simeth (MAALOX/MYLANTA) 200-200-20 MG/5ML suspension 30 mL   magnesium hydroxide (MILK OF MAGNESIA) suspension 30 mL   hydrOXYzine (ATARAX) tablet 25 mg   traZODone (DESYREL) tablet 50 mg   risperiDONE (RISPERDAL) tablet 3 mg   PTA Medications  Medication Sig   benztropine (COGENTIN) 0.5 MG tablet Take 1 tablet (0.5 mg total) by mouth 2 (two) times daily.   gabapentin (NEURONTIN) 300 MG capsule Take 1 capsule (300 mg total) by mouth 3 (three) times daily.   haloperidol (HALDOL) 5 MG tablet Take 1 tablet (5 mg total) by mouth 3 (three) times daily.   hydrOXYzine (ATARAX) 25 MG tablet Take 1 tablet (25 mg total) by mouth 3 (three) times daily as needed for anxiety.   propranolol (INDERAL) 10 MG tablet Take 1 tablet (10 mg total) by mouth every 8 (eight) hours.   traZODone (DESYREL) 50 MG tablet Take 1 tablet (50 mg total) by mouth at bedtime as needed for sleep.      Medical Decision Making  Patient will be admitted to [facility] for continuous assessment with follow-up by psychiatry     Recommendations  Based on my evaluation the patient does not appear to have an emergency medical condition.  Maricela Bo, NP 02/25/23  7:57 AM

## 2023-02-25 NOTE — Progress Notes (Signed)
   02/25/23 1748  Psych Admission Type (Psych Patients Only)  Admission Status Voluntary  Psychosocial Assessment  Patient Complaints Self-harm thoughts  Eye Contact Brief  Facial Expression Flat  Affect Flat  Speech Logical/coherent  Interaction Assertive  Motor Activity Other (Comment) (Steady gait)  Appearance/Hygiene Unremarkable  Behavior Characteristics Cooperative;Calm  Mood Depressed;Anxious  Aggressive Behavior  Effect No apparent injury  Thought Process  Coherency WDL  Content WDL  Delusions None reported or observed  Perception WDL  Hallucination None reported or observed  Judgment Impaired  Confusion None  Danger to Self  Current suicidal ideation? Passive  Description of Suicide Plan Passive SI, No plan  Self-Injurious Behavior No self-injurious ideation or behavior indicators observed or expressed   Agreement Not to Harm Self Yes  Description of Agreement verbal  Danger to Others  Danger to Others None reported or observed

## 2023-02-25 NOTE — ED Notes (Signed)
Pt was given cereal and muffin for breakfast.  

## 2023-02-25 NOTE — ED Provider Notes (Signed)
FBC/OBS ASAP Discharge Summary  Date and Time: 02/25/2023 11:33 AM  Name: William Romero  MRN:  [id number]   Discharge Diagnoses:  Final diagnoses:  Paranoia (HCC)  Schizophrenia, paranoid (HCC)   William Romero:[id number] presented voluntarily to [facility] reporting worsening paranoia and hallucination on 02/24/2023. He was admitted to the continuous assessment unit for overnight observation.   Per chart review patient has a past psychiatric history significant for ADHD, schizophreniform, psychosis, and cannabis use. Patient was admitted for inpatient psychiatric treatment at [facility] due to psychosis from 06/11 to 02/22/2023. He was discharged on Haldol 5mg  BID, gabapentin 300mg  TID, Atarax 25mg  TIP prn, and propranolol 10 TID, trazodone 50mg . He then presented to [facility] on 02/23/2023 and admitted for overnight observation he was discharged on 02/24/2023 with resources and plans to follow-up with outpatient services. He presented to [facility] yesterday but left AMA and presented to [facility] again on 02/25/2023 and admitted for overnight observation.  Of note patient has an outpatient appt in place with [facility] for medication management (in person) on 02/27/2023 @2 :30 an therapy via Virtual on 02/28/2023 @10am .  Patient evaluated face to face by this provider and chart reviewed on 11/25/2022.  Subjective:   On evaluation patient is observed laying in his bed asleep, he is easily awakened. He is casually dressed and makes good eye contact. He is alert/oriented x 4, calm and cooperative. His speech is clear, coherent, at normal rate and tone.  He continues to endorse depression and has a dysphoric affect. He endorses suicidal ideations with out any specific plan. He denies HI at this time. He is vague in his responses concerning SI and HI. He can not contract for safety. States his paranoia has worsened since being on Haldol. He believes that people are out to get him and  kill him. States the paranoia is so intense it makes him suicidal, and he feels afraid he will hurt himself.  He believes the medication has helped resolve his auditory hallucinations. States he stopped taking his medications once discharged from the hospital. He is also denying visual hallucinations and homicidal ideations. He does not appear to be responding to internal/external stimuli.   Discussed the importance of medication compliance. Risperdal was ordered on admission, but patient has refused. He does not want to restart Haldol. Discussed Zyprexa 5 mg BID and pt is in agreement.   Stay Summary:   Patient is reporting suicidal ideations and cannot contract for safety.  He is recommended for inpatient psychiatric admission.  [facility] notified and patient has been accepted.  Total Time spent with patient: 30 minutes  Past Psychiatric History: see H&P Past Medical History: see H&P Family History: see H&P Family Psychiatric History: see H&P Social History: see H&P Tobacco Cessation:  Prescription not provided because: he is being transferred to [facility].   Current Medications:  Current Facility-Administered Medications  Medication Dose Route Frequency Provider Last Rate Last Admin   acetaminophen (TYLENOL) tablet 650 mg  650 mg Oral Q6H PRN Ajibola, Ene A, NP       alum & mag hydroxide-simeth (MAALOX/MYLANTA) 200-200-20 MG/5ML suspension 30 mL  30 mL Oral Q4H PRN Ajibola, Ene A, NP       gabapentin (NEURONTIN) capsule 300 mg  300 mg Oral TID Ardis Hughs, NP       hydrOXYzine (ATARAX) tablet 25 mg  25 mg Oral TID PRN Ajibola, Ene A, NP       magnesium hydroxide (MILK  OF MAGNESIA) suspension 30 mL  30 mL Oral Daily PRN Ajibola, Ene A, NP       OLANZapine (ZYPREXA) tablet 5 mg  5 mg Oral BID Ardis Hughs, NP       traZODone (DESYREL) tablet 50 mg  50 mg Oral QHS PRN Ajibola, Ene A, NP       Current Outpatient Medications  Medication Sig Dispense Refill   benztropine  (COGENTIN) 0.5 MG tablet Take 1 tablet (0.5 mg total) by mouth 2 (two) times daily. 60 tablet 0   gabapentin (NEURONTIN) 300 MG capsule Take 1 capsule (300 mg total) by mouth 3 (three) times daily. 90 capsule 0   haloperidol (HALDOL) 5 MG tablet Take 1 tablet (5 mg total) by mouth 3 (three) times daily. 90 tablet 0   hydrOXYzine (ATARAX) 25 MG tablet Take 1 tablet (25 mg total) by mouth 3 (three) times daily as needed for anxiety. 60 tablet 0   propranolol (INDERAL) 10 MG tablet Take 1 tablet (10 mg total) by mouth every 8 (eight) hours. 90 tablet 0   traZODone (DESYREL) 50 MG tablet Take 1 tablet (50 mg total) by mouth at bedtime as needed for sleep. 30 tablet 0    PTA Medications:  Facility Ordered Medications  Medication   acetaminophen (TYLENOL) tablet 650 mg   alum & mag hydroxide-simeth (MAALOX/MYLANTA) 200-200-20 MG/5ML suspension 30 mL   magnesium hydroxide (MILK OF MAGNESIA) suspension 30 mL   hydrOXYzine (ATARAX) tablet 25 mg   traZODone (DESYREL) tablet 50 mg   gabapentin (NEURONTIN) capsule 300 mg   OLANZapine (ZYPREXA) tablet 5 mg   PTA Medications  Medication Sig   benztropine (COGENTIN) 0.5 MG tablet Take 1 tablet (0.5 mg total) by mouth 2 (two) times daily.   gabapentin (NEURONTIN) 300 MG capsule Take 1 capsule (300 mg total) by mouth 3 (three) times daily.   haloperidol (HALDOL) 5 MG tablet Take 1 tablet (5 mg total) by mouth 3 (three) times daily.   hydrOXYzine (ATARAX) 25 MG tablet Take 1 tablet (25 mg total) by mouth 3 (three) times daily as needed for anxiety.   propranolol (INDERAL) 10 MG tablet Take 1 tablet (10 mg total) by mouth every 8 (eight) hours.   traZODone (DESYREL) 50 MG tablet Take 1 tablet (50 mg total) by mouth at bedtime as needed for sleep.       09/26/2022    3:43 PM 06/21/2022    8:20 AM 01/03/2022   11:30 AM  Depression screen PHQ 2/9  Decreased Interest 0 0 0  Down, Depressed, Hopeless 0 0 0  PHQ - 2 Score 0 0 0  Altered sleeping 0 0 0   Tired, decreased energy 0 0 0  Change in appetite 0 0 0  Feeling bad or failure about yourself  0 0 0  Trouble concentrating 0 0 0  Moving slowly or fidgety/restless 0 0 0  Suicidal thoughts 0 0 0  PHQ-9 Score 0 0 0  Difficult doing work/chores Not difficult at all Not difficult at all Not difficult at all    [facility] ED from 02/24/2023 in [facility] Most recent reading at 02/24/2023 11:54 PM ED from 02/24/2023 in [facility] Emergency Department at [facility] Most recent reading at 02/24/2023 10:21 AM ED from 02/23/2023 in [facility] Most recent reading at 02/23/2023  5:35 AM  C-SSRS RISK CATEGORY High Risk No Risk No Risk  Musculoskeletal  Strength & Muscle Tone: within normal limits Gait & Station: normal Patient leans: N/A  Psychiatric Specialty Exam  Presentation  General Appearance:  Casual  Eye Contact: Good  Speech: Clear and Coherent; Normal Rate  Speech Volume: Normal  Handedness: Right   Mood and Affect  Mood: Dysphoric  Affect: Congruent   Thought Process  Thought Processes: Coherent  Descriptions of Associations:Intact  Orientation:Full (Time, Place and Person)  Thought Content:Logical  Diagnosis of Schizophrenia or Schizoaffective disorder in past: Yes  Duration of Psychotic Symptoms: Greater than six months   Hallucinations:Hallucinations: None Description of Auditory Hallucinations: "Voices that are making fun of my thought and challenging my thinkin"  Ideas of Reference:Paranoia  Suicidal Thoughts:Suicidal Thoughts: Yes, Active SI Active Intent and/or Plan: With Intent; Without Plan SI Passive Intent and/or Plan: Without Plan  Homicidal Thoughts:Homicidal Thoughts: No HI Passive Intent and/or Plan: Without Plan   Sensorium  Memory: Immediate Good; Recent Good; Remote Good  Judgment: Fair  Insight: Fair   Executive Functions   Concentration: Good  Attention Span: Good  Recall: Good  Fund of Knowledge: Good  Language: Good   Psychomotor Activity  Psychomotor Activity: Psychomotor Activity: Normal   Assets  Assets: Communication Skills; Desire for Improvement; Physical Health; Resilience; Financial Resources/Insurance   Sleep  Sleep: Sleep: Fair Number of Hours of Sleep: 6   Nutritional Assessment (For OBS and FBC admissions only) Has the patient had a weight loss or gain of 10 pounds or more in the last 3 months?: No Has the patient had a decrease in food intake/or appetite?: No Does the patient have dental problems?: No Does the patient have eating habits or behaviors that may be indicators of an eating disorder including binging or inducing vomiting?: No Has the patient recently lost weight without trying?: 0 Has the patient been eating poorly because of a decreased appetite?: 0 Malnutrition Screening Tool Score: 0    Physical Exam  Physical Exam Vitals and nursing note reviewed.  Constitutional:      General: He is not in acute distress.    Appearance: He is well-developed.  Eyes:     General:        Right eye: No discharge.        Left eye: No discharge.  Cardiovascular:     Rate and Rhythm: Normal rate.  Pulmonary:     Effort: Pulmonary effort is normal. No respiratory distress.  Musculoskeletal:        General: No swelling. Normal range of motion.     Cervical back: Normal range of motion.  Neurological:     Mental Status: He is alert and oriented to person, place, and time.  Psychiatric:        Attention and Perception: Attention and perception normal.        Mood and Affect: Mood normal.        Speech: Speech normal.        Behavior: Behavior is cooperative.        Thought Content: Thought content is paranoid. Thought content includes suicidal ideation.        Cognition and Memory: Cognition normal.        Judgment: Judgment normal.    Review of Systems   Constitutional: Negative.   HENT: Negative.    Eyes: Negative.   Respiratory: Negative.    Cardiovascular: Negative.   Musculoskeletal: Negative.   Skin: Negative.   Neurological: Negative.   Psychiatric/Behavioral: Negative.     Blood pressure 107/71,  pulse 95, temperature 98.8 F (37.1 C), temperature source Oral, resp. rate 20, SpO2 96 %. There is no height or weight on file to calculate BMI.    Disposition:   Patient meets criteria for inpatient psychiatric admission.  [facility] notified and patient has been accepted.  Will start Zyprexa 5 mg twice daily -has refused Haldol and risperidone.  Will restart home medication of gabapentin 300 mg 3 times a day for anxiety.  Will not restart propranolol 10 mg every 8 hours for akathisia due to possible side effect of paranoia.  Ardis Hughs, NP 02/25/2023, 11:33 AM

## 2023-02-25 NOTE — Group Note (Unsigned)
Date:  02/25/2023 Time:  7:18 PM  Group Topic/Focus:  Wrap-Up Group:   The focus of this group is to help patients review their daily goal of treatment and discuss progress on daily workbooks.     Participation Level:  {BHH PARTICIPATION LEVEL:22264}  Participation Quality:  {BHH PARTICIPATION QUALITY:22265}  Affect:  {BHH AFFECT:22266}  Cognitive:  {BHH COGNITIVE:22267}  Insight: {BHH Insight2:20797}  Engagement in Group:  {BHH ENGAGEMENT IN GROUP:22268}  Modes of Intervention:  {BHH MODES OF INTERVENTION:22269}  Additional Comments:  ***  William Romero [name] 02/25/2023, 7:18 PM  

## 2023-02-25 NOTE — ED Notes (Signed)
Patient discharge via ambulatory with a steady gait with [facility] [employer] staff member. Respirations equal and unlabored, skin warm and dry. No acute distress noted. Belongings returned from locker # 27.

## 2023-02-25 NOTE — Progress Notes (Signed)
Patient admitted for paranoia and hallucinating. Has a history of ADHD, schizophrenia, psychosis, cannabis abuse and MDD. Went to [facility] voluntarily reporting that he was "hearing voices" and that "people are out to get me." Also reports that "the medications aren't working." Patient denies ever being on medication until being discharged from [facility] according to him a few months ago and says the medications he was put on has been "messing" with him. On this admission, patient says he doesn't hear voices now but that he was hearing voices a week ago. Reports that he doesn't want to live, but does not have a plan to harm himself. Patient denies substance and tobacco use, however UDS positive for THC. At this time, denies AVH. Patient states that he is homeless and that he doesn't have support from his family.

## 2023-02-25 NOTE — Discharge Instructions (Signed)
Transfer patient to [facility] for IP psychiatric bed availability

## 2023-02-26 DIAGNOSIS — Z5900 Homelessness unspecified: Secondary | ICD-10-CM

## 2023-02-26 MED ORDER — DULOXETINE HCL 20 MG PO CPEP
20.0000 mg | ORAL_CAPSULE | Freq: Every day | ORAL | Status: DC
  Administered 2023-02-26: 20 mg via ORAL
  Filled 2023-02-26 (×3): qty 1

## 2023-02-26 MED ORDER — OLANZAPINE 5 MG PO TABS
5.0000 mg | ORAL_TABLET | Freq: Two times a day (BID) | ORAL | Status: DC
  Administered 2023-02-26: 5 mg via ORAL
  Filled 2023-02-26 (×3): qty 1

## 2023-02-26 MED ORDER — DULOXETINE HCL 20 MG PO CPEP
20.0000 mg | ORAL_CAPSULE | Freq: Every day | ORAL | Status: DC
  Filled 2023-02-26 (×2): qty 1

## 2023-02-26 MED ORDER — OLANZAPINE 10 MG PO TABS
10.0000 mg | ORAL_TABLET | Freq: Every day | ORAL | Status: DC
  Administered 2023-02-27: 10 mg via ORAL
  Filled 2023-02-26 (×3): qty 1

## 2023-02-26 NOTE — H&P (Cosign Needed Addendum)
Psychiatric Adult Admission Assessment  Patient Identification: William Romero MRN:  [id number] Date of Evaluation:  02/26/2023 Chief Complaint:  Schizophrenia (HCC) [F20.9] Principal Diagnosis: Schizophrenia (HCC) Diagnosis:  Principal Problem:   Schizophrenia (HCC) Active Problems:   Homelessness   CC: Paranoia History of Present Illness: William Romero is a 42 y.o. male with PMH of MDD, ADHD, homelessness and schizophrenia per chart, who presented voluntary to [facility] 02/23/23. Of note, patient was discharged from [facility] 6/19 from a 6/11-6/19 [facility] admission.  Per Dr. Weyman Rodney H&P from 6/11-6/19 [facility] Admission: "Patient reported that he was admitted for "AH", that has been present for 3-4 months. Stated that the voices are 3 different females that are agnostic. These voices are women of people he knows from the past. Denied command AH, rather they "antagonize" this thoughts. He declined to give further details despite re-framing the question multiple different ways. Stated that 2 of the voices are often of his paternal and maternal grandma. Mentioned that he is annoyed with these voices because it interferes with him getting a job. Stated that he feels like the AH are "a liability" because he is worried that the voices (his grandmas) will take the information he learns and give them to others. He believes that his grandmas are antagonizing his for unclear reasons. He mentioned a time last year where his grandma kissed him on the forehead, he interpreted this as his grandma is comparing him to others because he doesn't have a partner.   Denied any new stressors, substances that could have triggered these AH. When asked about THC + UDS, patient stated that he knows of marijuana and denied using them. Denied having AH like this in the past. Reported that his current stressors are housing instability, where he was unhoused ~57yr ago. He declined to give further information to why. He has been  living in his car up until about a few weeks ago, because his car broke down and stopped running. The past week, patient had been living on the street.    Patient reported being on multiple psychotropics in the past, and he perseverated that he doesn't feel like he is depressed. Only current med patient takes is adderall, which he has been taking consistently for years. Did note having 1/2 of a dozen inpatient psych admission that was a "set up" and he was "provoked" and admitted under IVC between 2012-2019.    Reported AH, thought withdrawal, and paranoia per above.   He denied VH, thought insertion, mind control, powers, and idea of reference.    During current evaluation, patient stated that he last heard the [facility] was this morning. He declined to tell the content.  Reported that his sleep was good because he is not sleeping on the street. Mood is "good", appetite is appropriate. Denied active and passive SI/HI. Denied VH. "   Interval history:  Patient reports feeling more paranoid since he left [facility] which he attributes to his antipsychotic.  He states that this paranoia led to him feeling like he wanted to harm himself.  I asked what the association between the 2 were and he is simply stated "I wanted to hurt myself because I was paranoid". He states he is on too much medication and wanted to consolidate it to decrease the frequency and number of medications he was taking. When he discharged from [facility] to [facility], he stated he never went in because "they seemed busy". He states he had gone to [facility] long ED due  to thinking he had a stroke due to feeling numbness in his left pinky and left shoulder.  He believes that this may have been due to severe anxiety as opposed to truly experiencing a stroke.  He left AMA due to thinking he was about to have a panic attack.  I discussed with him medication adjustments that may very beneficial and allow for him to decrease frequency of when he takes medication and he  was agreeable to trying Cymbalta as he has never been on this antidepressant in the past to aid with his depression and anxiety. He endorses passive SI but denies HI/AVH.   Past Psychiatric History:  Diagnoses: MDD, ADHD and schizophrenia Inpatient treatment: ~6 between 2012-2019 Suicide: Denied Homicide: Denied Medication history: adderall, bupropion Medication compliance: Poor, except to adderall  Substance Abuse Hx: Alcohol: intermittent Nicotine:  reports that he has quit smoking. His smoking use included cigarettes. He smoked an average of .5 packs per day. He has never used smokeless tobacco. Marijuana: Yes IV drug use: Denied Stimulants: Possible stimulant misuse  Opiates: Denied Sedative/hypnotics: Denied Hallucinogens: Denied H/O withdrawals, blackouts: Denied H/O DT: Denied H/O Detox / Rehab: Denied DUI/DWI: Denied  Past Medical History: Medical Diagnoses:  Past Medical History:  Diagnosis Date   Schizophrenia (HCC)    Current home medications:none PCP: Philip Aspen, Limmie Patricia, MD  Family History: Denies family psych hx  Social History: Housing: unhoused Finances: unemployed Children: NA Guns/Weapons: Denied Legal: jail - reported for self defense  Risk to self: minimal Risk to others: none  Lab Results:  No results found for this or any previous visit (from the past 48 hour(s)).   Blood Alcohol level:  Lab Results  Component Value Date   ETH <10 02/24/2023   ETH <10 02/23/2023    Metabolic Disorder Labs:  Lab Results  Component Value Date   HGBA1C 5.0 02/13/2023   MPG 96.8 02/13/2023   No results found for: "PROLACTIN" Lab Results  Component Value Date   CHOL 150 02/13/2023   TRIG 100 02/13/2023   HDL 53 02/13/2023   CHOLHDL 2.8 02/13/2023   VLDL 20 02/13/2023   LDLCALC 77 02/13/2023    Musculoskeletal: Strength & Muscle Tone: wnl Gait & Station: n/a  Psychiatric Specialty Exam: Presentation  General Appearance:   Appropriate for Environment; Casual  Eye Contact: Good  Speech: Clear and Coherent; Normal Rate  Speech Volume: Normal   Mood and Affect  Mood: Euthymic  Affect: Appropriate; Congruent   Thought Process  Thought Processes: Coherent; Goal Directed; Linear  Descriptions of Associations:Intact  Orientation:Full (Time, Place and Person)  Thought Content:Logical  Hallucinations:Hallucinations: None  Ideas of Reference:None  Suicidal Thoughts:Suicidal Thoughts: Yes, Passive SI Active Intent and/or Plan: With Intent; Without Plan  Homicidal Thoughts:Homicidal Thoughts: No   Sensorium  Memory: Immediate Good; Recent Good; Remote Good  Judgment: Fair  Insight: Fair   Art therapist  Concentration: Good  Attention Span: Good  Recall: Good  Fund of Knowledge: Good  Language: Good   Psychomotor Activity  Psychomotor Activity: Psychomotor Activity: Normal   Assets  Assets: Communication Skills; Desire for Improvement; Physical Health   Sleep  Sleep: Sleep: Good   Physical Exam: Physical Exam Vitals and nursing note reviewed.  Constitutional:      Appearance: Normal appearance. He is normal weight.  HENT:     Head: Normocephalic and atraumatic.  Pulmonary:     Effort: Pulmonary effort is normal.  Neurological:     General: No focal deficit  present.     Mental Status: He is oriented to person, place, and time.    Review of Systems  Respiratory:  Negative for shortness of breath.   Cardiovascular:  Negative for chest pain.  Gastrointestinal:  Negative for abdominal pain, constipation, diarrhea, heartburn, nausea and vomiting.  Neurological:  Negative for headaches.   Blood pressure 126/88, pulse 88, temperature 98.2 F (36.8 C), temperature source Oral, resp. rate 18, height 6' (1.829 m), weight 97.2 kg, SpO2 98 %. Body mass index is 29.05 kg/m.  Assessment William Romero is a 42 y.o. male with PMH of MDD, ADHD,  homelessness and schizophrenia per chart, who presented voluntary to [facility] 02/23/23. Of note, patient was discharged from [facility] 6/19 from a 6/11-6/19 [facility] admission.  PLAN Safety and Monitoring: voluntarily admission to inpatient psychiatric unit for safety, stabilization and treatment Daily contact with patient to assess and evaluate symptoms and progress in treatment Appropriate medication management to further stabilize patient Patient's case will be regularly discussed in multi-disciplinary team meeting Observation Level : q15 minute checks Vital signs: q12 hours Precautions: suicide, elopement, and assault  2. Psychiatric Problems ?  Unspecified schizophreniform spectrum disorder Homelessness Malingering Tobacco use disorder Cannabis use disorder -Discontinue gabapentin -Continue Zyprexa 10 mg nightly -Start Cymbalta 20 mg nightly for depression and anxiety --The risks/benefits/side-effects/alternatives to this medication were discussed in detail with the patient and time was given for questions. The patient consents to medication trial.  -- Metabolic profile and EKG monitoring obtained while on an atypical antipsychotic  -- Encouraged patient to participate in unit milieu and in scheduled group therapies   3. Medical Management none  PRN The following PRN medications were added to ensure patient can focus on treatment. These were discussed with patient and patient aware of ability to ask for the following medications:  -Tylenol 650 mg q6hr PRN for mild pain -Mylanta 30 ml suspension for indigestion -Milk of Magnesia 30 ml for constipation -Trazodone 50 mg qhs for insomnia -Hydroxyzine 25 mg tid PRN for anxiety  4. Discharge Planning Patient will require the following based on my assessment:  Greatly appreciate CSW and Case management assistance with facilitating these needs and any further recommendations regarding patient's needs upon discharge. Estimated LOS: 2-3  days Discharge Concerns: Need to establish a safety plan; Medication compliance and effectiveness Discharge Goals: Return home with outpatient referrals for mental health follow-up including medication management/psychotherapy   Long Term Goal(s): Minimizing disruption current psychiatric diagnosis is causing so that patient can be safely discharged Short Term Goals: Compliance with proposed treatment plan and adjusting to psychiatric unit and peers.  I certify that inpatient services furnished can reasonably be expected to improve the patient's condition.     Park Pope, MD PGY2 Psychiatry Resident 02/26/2023 3:15 PM

## 2023-02-26 NOTE — [facility] Suicide Risk Assessment (Signed)
  [facility] Admission Suicide Risk Assessment   Nursing information obtained from:  Patient Demographic factors:  Male, Caucasian, Living alone, Low socioeconomic status Current Mental Status:  Self-harm thoughts Loss Factors:  NA Historical Factors:  Impulsivity Risk Reduction Factors:  Sense of responsibility to family  Total Time spent with patient: 45 minutes Principal Problem: Schizophrenia (HCC) Diagnosis:  Principal Problem:   Schizophrenia (HCC) Active Problems:   Homelessness   Subjective Data: see H&P  Continued Clinical Symptoms:  Alcohol Use Disorder Identification Test Final Score (AUDIT): 0 The "Alcohol Use Disorders Identification Test", Guidelines for Use in Primary Care, Second Edition.  World Science writer [facility]). Score between 0-7:  no or low risk or alcohol related problems. Score between 8-15:  moderate risk of alcohol related problems. Score between 16-19:  high risk of alcohol related problems. Score 20 or above:  warrants further diagnostic evaluation for alcohol dependence and treatment.   CLINICAL FACTORS:   Severe Anxiety and/or Agitation Alcohol/Substance Abuse/Dependencies   Musculoskeletal: Strength & Muscle Tone: within normal limits Gait & Station: normal Patient leans: N/A  Psychiatric Specialty Exam:  Presentation  General Appearance:  Appropriate for Environment; Casual   Eye Contact: Good   Speech: Clear and Coherent; Normal Rate   Speech Volume: Normal   Handedness: Right   Mood and Affect  Mood: Euthymic   Affect: Appropriate; Congruent    Thought Process  Thought Processes: Coherent; Goal Directed; Linear   Descriptions of Associations:Intact   Orientation:Full (Time, Place and Person)   Thought Content:Logical   History of Schizophrenia/Schizoaffective disorder:Yes   Duration of Psychotic Symptoms:Greater than six months   Hallucinations:Hallucinations: None   Ideas of  Reference:None   Suicidal Thoughts:Suicidal Thoughts: Yes, Passive SI Active Intent and/or Plan: With Intent; Without Plan   Homicidal Thoughts:Homicidal Thoughts: No    Sensorium  Memory: Immediate Good; Recent Good; Remote Good   Judgment: Fair   Insight: Fair    Art therapist  Concentration: Good   Attention Span: Good   Recall: Good   Fund of Knowledge: Good   Language: Good    Psychomotor Activity  Psychomotor Activity: Psychomotor Activity: Normal    Assets  Assets: Communication Skills; Desire for Improvement; Physical Health    Sleep  Sleep: Sleep: Good     Physical Exam: Physical Exam ROS Blood pressure 126/88, pulse 88, temperature 98.2 F (36.8 C), temperature source Oral, resp. rate 18, height 6' (1.829 m), weight 97.2 kg, SpO2 98 %. Body mass index is 29.05 kg/m.   COGNITIVE FEATURES THAT CONTRIBUTE TO RISK:  Closed-mindedness    SUICIDE RISK:   Mild:  Suicidal ideation of limited frequency, intensity, duration, and specificity.  There are no identifiable plans, no associated intent, mild dysphoria and related symptoms, good self-control (both objective and subjective assessment), few other risk factors, and identifiable protective factors, including available and accessible social support.  PLAN OF CARE: see H&P  I certify that inpatient services furnished can reasonably be expected to improve the patient's condition.   Park Pope, MD 02/26/2023, 3:15 PM

## 2023-02-26 NOTE — Progress Notes (Signed)
   02/26/23 0102  15 Minute Checks  Location Bedroom  Visual Appearance Calm  Behavior Composed  Sleep (Behavioral Health Patients Only)  Calculate sleep? (Click Yes once per 24 hr at 0600 safety check) Yes  Documented sleep last 24 hours 9.25

## 2023-02-26 NOTE — Progress Notes (Signed)
Pt A & O to self, place and situation. Presents guarded with blunted affect, irritable on interactions but is cooperative with care. Isolative to his room this shift. Denies HI, AVH and pain. Continues to endorse passive SI "yeah, I'm still having those thoughts, many, like earlier also" however, he denies a plan / method at present. Took his Gabapentin as ordered with increased encouragement "I just don't want it. Well, I guess I will wait and talk to the doctor then". Observed with a verbal outburst on phone X1 with relative "You wonder why I'm like this, stop telling me shit I already know. Stop doing this to me" and slammed the phone. Pt was redirectable at the time. PRN Vistaril given twice this shift for increased anxiety with desired effect. Remains medication compliant without adverse drug reactions. Encouraged by staff to voice concerns. Emotional support and reassurance offered this shift. Pt did not attend scheduled groups despite multiple prompts. Tolerated meals and fluids well. Safety maintained in milieu.

## 2023-02-27 ENCOUNTER — Encounter (HOSPITAL_COMMUNITY): Payer: Self-pay

## 2023-02-27 ENCOUNTER — Ambulatory Visit (HOSPITAL_COMMUNITY): Payer: No Typology Code available for payment source | Admitting: Psychiatry

## 2023-02-27 MED ORDER — DULOXETINE HCL 20 MG PO CPEP
20.0000 mg | ORAL_CAPSULE | Freq: Every day | ORAL | Status: DC
  Administered 2023-02-27 – 2023-02-28 (×2): 20 mg via ORAL
  Filled 2023-02-27 (×4): qty 1

## 2023-02-27 NOTE — Progress Notes (Addendum)
[facility] MD Progress Note  Date: 02/27/2023 1:42 PM Name: William Romero  DOB: 20-Aug-1981 MRN: [id number] Unit: [id number]  CC: psychosis  REASON FOR ADMISSION   William Romero is a 42 y.o. male with PMH of unspecified schizophreniform vs substance induced psychosis MDD, ADHD, cannabis use d/o, tobacco use d/o, housing instability, who presented voluntary to [facility] (02/24/2023) for weakness then presented to [facility] (02/24/2023), then admitted to [facility] Voluntary for paranoia in the setting of THC use. Prior to this, he was recently admitted to [facility] 02/14/2023-02/22/2023 and discharged to self. On 02/23/2023, he then presented to [facility] and was discharged again.   Principal Problem: Schizophrenia (HCC) Diagnosis: Principal Problem:   Schizophrenia (HCC) Active Problems:   Homelessness  CHART REVIEW FROM LAST 24 HOURS   Adherent to scheduled meds: yes  Agitation PRNs: none  Per nursing staff: no acute behavioral concerns  Groups: yes, engaged  Documented sleep last 24 hours: 10.75   SUBJECTIVE  Patient was initially seen interacting in group. During evaluation, patient was on edge, restless, mildly elated.   Patient stated that he feels "great".  Denied any AVH.  However he continues to be worried about his family being after him.  York Spaniel he is worried about his wellbeing.  He is unable to pinpoint what specifically about his family that worries him, he speaks of vague things that they have said in passing in the past.  He is unable to identify any concrete evidence at this time.  Reported that he is having ruminating thoughts about this, causing his depression.  Stated he wants to live, but this worry makes him want to die.  Otherwise reported that his sleep is not good, having a nightmare last night.  His appetite is also not good.  Denied active SI/HI/AVH.  Endorsed passive SI, persecutory possibly paranoid delusions.  Review of Systems  Constitutional:  Negative for  malaise/fatigue.  Respiratory:  Negative for shortness of breath.   Cardiovascular:  Negative for chest pain.  Gastrointestinal:  Negative for abdominal pain, constipation, diarrhea, nausea and vomiting.  Neurological:  Negative for dizziness and headaches.   Collateral: William Romero (mom) @ [phone] - unable to get in contact, left a HIPAA compliant voicemail  HISTORY  Past Psychiatric History:  Diagnoses: MDD, ADHD and schizophrenia Inpatient treatment: ~6 between 2012-2019 Suicide: Denied Homicide: Denied Medication history: Unknown Medication compliance: Poor, except to adderall   Past Medical/Surgical History:  Medical Diagnoses:NA Prior Hosp: NA Prior Surgeries/Trauma: NA Concussions/Head Trauma/LOC:Denied Seizures: Denied PCP: William Romero, William Patricia, MD  Allergies: Patient has no known allergies.    Family Psychiatric History:  Denied    Social History:  Housing: unhoused Finances: unemployed Children: NA Guns/Weapons: Denied Legal: jail - reported for self defense   Substance Use History: Alcohol: last drink was ~5 d prior to admission, 2-3 beers. Prior to that ~3 weeks prior was 1 pint of whiskey  Nicotine:  reports that he has quit smoking. His smoking use included cigarettes. He smoked an average of .5 packs per day. He has never used smokeless tobacco. Marijuana: Yes IV drug use: Denied Stimulants: Possible stimulant misuse  Opiates: Denied Sedative/hypnotics: Denied Hallucinogens: Denied H/O withdrawals, blackouts: Denied H/O DT: Denied H/O Detox / Rehab: Denied DUI/DWI: Denied  Past Medical History:  Past Medical History:  Diagnosis Date   Schizophrenia (HCC)     Past Surgical History:  Procedure Laterality Date   APPENDECTOMY     Family History:  History  reviewed. No pertinent family history. Social History:  Social History   Substance and Sexual Activity  Alcohol Use Not Currently   Alcohol/week: 5.0 standard drinks of  alcohol   Types: 5 Cans of beer per week     Social History   Substance and Sexual Activity  Drug Use No    Social History   Socioeconomic History   Marital status: Single    Spouse name: Not on file   Number of children: Not on file   Years of education: Not on file   Highest education level: Not on file  Occupational History   Not on file  Tobacco Use   Smoking status: Former    Packs/day: .5    Types: Cigarettes   Smokeless tobacco: Never  Vaping Use   Vaping Use: Never used  Substance and Sexual Activity   Alcohol use: Not Currently    Alcohol/week: 5.0 standard drinks of alcohol    Types: 5 Cans of beer per week   Drug use: No   Sexual activity: Not on file  Other Topics Concern   Not on file  Social History Narrative   Not on file   Social Determinants of Health   Financial Resource Strain: Not on file  Food Insecurity: No Food Insecurity (02/25/2023)   Hunger Vital Sign    Worried About Running Out of Food in the Last Year: Never true    Ran Out of Food in the Last Year: Never true  Recent Concern: Food Insecurity - Food Insecurity Present (02/24/2023)   Hunger Vital Sign    Worried About Running Out of Food in the Last Year: Often true    Ran Out of Food in the Last Year: Sometimes true  Transportation Needs: No Transportation Needs (02/25/2023)   PRAPARE - Administrator, Civil Service (Medical): No    Lack of Transportation (Non-Medical): No  Physical Activity: Not on file  Stress: Not on file  Social Connections: Not on file   Additional Social History:                         Current Medications: Current Facility-Administered Medications  Medication Dose Route Frequency Provider Last Rate Last Admin   acetaminophen (TYLENOL) tablet 650 mg  650 mg Oral Q6H PRN Ardis Hughs, NP       alum & mag hydroxide-simeth (MAALOX/MYLANTA) 200-200-20 MG/5ML suspension 30 mL  30 mL Oral Q4H PRN Ardis Hughs, NP        diphenhydrAMINE (BENADRYL) capsule 50 mg  50 mg Oral TID PRN Ardis Hughs, NP       Or   diphenhydrAMINE (BENADRYL) injection 50 mg  50 mg Intramuscular TID PRN Ardis Hughs, NP       DULoxetine (CYMBALTA) DR capsule 20 mg  20 mg Oral Seabron Spates, MD   20 mg at 02/26/23 2139   haloperidol (HALDOL) tablet 5 mg  5 mg Oral TID PRN Ardis Hughs, NP       Or   haloperidol lactate (HALDOL) injection 5 mg  5 mg Intramuscular TID PRN Ardis Hughs, NP       hydrOXYzine (ATARAX) tablet 25 mg  25 mg Oral TID PRN Ardis Hughs, NP   25 mg at 02/27/23 0828   LORazepam (ATIVAN) tablet 2 mg  2 mg Oral TID PRN Ardis Hughs, NP       Or   LORazepam (  ATIVAN) injection 2 mg  2 mg Intramuscular TID PRN Ardis Hughs, NP       magnesium hydroxide (MILK OF MAGNESIA) suspension 30 mL  30 mL Oral Daily PRN Ardis Hughs, NP       OLANZapine (ZYPREXA) tablet 10 mg  10 mg Oral QHS Park Pope, MD       traZODone (DESYREL) tablet 50 mg  50 mg Oral QHS PRN Ardis Hughs, NP   50 mg at 02/26/23 2139    OBJECTIVE  BP 133/86 (BP Location: Right Arm)   Pulse (!) 124   Temp 98.2 F (36.8 C)   Resp 18   Ht 6' (1.829 m)   Wt 97.2 kg   SpO2 98%   BMI 29.05 kg/m   Physical Exam Physical Exam Vitals and nursing note reviewed.  Constitutional:      General: He is not in acute distress.    Appearance: He is not ill-appearing, toxic-appearing or diaphoretic.  HENT:     Head: Normocephalic.  Pulmonary:     Effort: Pulmonary effort is normal. No respiratory distress.  Neurological:     Mental Status: He is alert.     AIMS:   ,  ,  ,  ,      Psychiatric Specialty Exam: Presentation General Appearance: Appropriate for Environment; Casual; Fairly Groomed  Eye Contact: Good  Speech: Clear and Coherent; Normal Rate  Volume: Normal  Handedness:Right  Mood and Affect  Mood: Anxious  Affect: Congruent; Appropriate; Constricted  Thought Process  Thought  Process: Coherent; Goal Directed  Descriptions of Associations: Circumstantial  Thought Content Suicidal Thoughts:Yes, Passive With Intent; Without Plan Without Plan  Homicidal Thoughts:No Without Plan  Hallucinations:None (denied avh) .  Ideas of Reference:None  Thought Content:Rumination; Perseveration  Sensorium  Memory:Immediate Good  Judgment:Fair  Insight:Shallow   Executive Functions  Orientation:Full (Time, Place and Person)  Language:Good  Concentration:Fair  Attention:Fair  Recall:Good  Fund of Knowledge:Good  Psychomotor Activity  Psychomotor Activity:Restlessness  Assets  Assets:Communication Skills; Desire for Improvement  Sleep  Quality:Fair  Documented sleep last 24 hours: 10.75   Lab Results:  No results found for this or any previous visit (from the past 48 hour(s)).  Blood Alcohol level:  Lab Results  Component Value Date   ETH <10 02/24/2023   ETH <10 02/23/2023   Metabolic Disorder Labs: Lab Results  Component Value Date   HGBA1C 5.0 02/13/2023   MPG 96.8 02/13/2023   No results found for: "PROLACTIN" Lab Results  Component Value Date   CHOL 150 02/13/2023   TRIG 100 02/13/2023   HDL 53 02/13/2023   CHOLHDL 2.8 02/13/2023   VLDL 20 02/13/2023   LDLCALC 77 02/13/2023   ASSESSMENT / PLAN  Diagnoses / Active Problems: Principal Problem:   Schizophrenia (HCC) Active Problems:   Homelessness  Safety and Monitoring: Voluntary admission to inpatient psychiatric unit for safety, stabilization and treatment    2. Psychiatric Diagnoses and Treatment:  Schizophreniform vs Substance induced psychosis  Cannabis use d/o  Housing instability  Malingering Presented with psychosis x3-21mo, unclear etiology. Broad ddx, including substance induced, acute stress given his years of psychiatric stability (chart shows regular visit to PCP for ADHD and only few recent ED visits over the past year). He was recently discharged from  [facility] (6/11-6/19) and returned for paranoia in the setting of THC use. Patient no longer has hallucinations. He does have significant activating side effects to cymbalta, will reassess tomorrow for possible up titration.  Encouraged cessation     Continued cymbalta 20 mg daily Continued zyprexa 10 mg qHS  Tobacco use d/o  NRTs Encouraged cessation   Stimulant misuse d/o  Reported adherent to adderall for years and reported still taking it, however UDS negative for amphetamines. Possible diversion, maybe he didn't take it 24h prior to UDS. Discontinued home adderall 20mg  + 10mg  daily   3. Medical Issues Being Addressed:  NA   4. Routine and other pertinent labs:   BMI:29.05 CMP Ca  8.4, ALT 52 CBC WNL PT/INR/PTT WNL CTH no acute processes  Qtc 470 UDS+THC BAL <10  5. Disposition Planning:  Tentative Date: 6/27 Barrier: psychosis Location: shelter  Treatment Plan Summary: I certify that inpatient services furnished can reasonably be expected to improve the patient's condition.   Daily contact with patient to assess and evaluate symptoms and progress in treatment and Medication management Daily contact with patient to assess and evaluate symptoms and progress in treatment Patient's case to be discussed in multi-disciplinary team meeting Observation Level: q15 minute checks  Vital signs: q12 hours Precautions: suicide, elopement, and assault The risks/benefits/side-effects/alternatives to this medication were discussed in detail with the patient and time was given for questions. The patient consents to medication trial. The patient consents to medication trial. FDA black box warnings, if present, were discussed. Metabolic profile and EKG monitoring obtained while on an atypical antipsychotic  Encouraged patient to participate in unit milieu and in scheduled group therapies   Total Time spent with patient:  Patient's case was discussed with Attending, see attestation for more  information.   Signed: Princess Bruins, DO PGY-2 [facility] - Adult  [address] [place], [place] [address] Ph: [phone] Fax: [phone] Patient ID: DUONG HAYDEL, male   DOB: 1980-11-30, 42 y.o.   MRN: [id number]

## 2023-02-27 NOTE — Progress Notes (Signed)
   02/27/23 2300  Psych Admission Type (Psych Patients Only)  Admission Status Voluntary  Psychosocial Assessment  Patient Complaints Anxiety;Isolation  Eye Contact Fair  Facial Expression Anxious;Sad  Affect Anxious  Speech Soft;Logical/coherent  Interaction Assertive  Motor Activity Slow  Appearance/Hygiene Unremarkable  Behavior Characteristics Cooperative;Appropriate to situation  Mood Depressed  Thought Process  Coherency WDL  Content WDL  Delusions None reported or observed  Perception WDL  Hallucination None reported or observed  Judgment Poor  Confusion None  Danger to Self  Current suicidal ideation? Denies  Self-Injurious Behavior No self-injurious ideation or behavior indicators observed or expressed   Agreement Not to Harm Self Yes  Description of Agreement verbal

## 2023-02-27 NOTE — B[facility] Counselor (Signed)
Adult Comprehensive Assessment  Patient ID: EVANN ERAZO, male   DOB: Oct 29, 1980, 42 y.o.   MRN: [id number]  Information Source: Information source: Patient  Current Stressors:  Patient states their primary concerns and needs for treatment are:: Patient states that he had increased paranoia Patient states their goals for this hospitilization and ongoing recovery are:: Patient states that he wants his medications stabilized Educational / Learning stressors: none reported Employment / Job issues: none reported Family Relationships: none reported Surveyor, quantity / Lack of resources (include bankruptcy): none reported Housing / Lack of housing: none reported Physical health (include injuries & life threatening diseases): none reported Social relationships: none reported Substance abuse: none reported Bereavement / Loss: none reported  Living/Environment/Situation:  Living Arrangements: Other (Comment) Living conditions (as described by patient or guardian): Homeless, living in his car Who else lives in the home?: pt lives alone How long has patient lived in current situation?: 1 year What is atmosphere in current home: Temporary, Dangerous  Family History:  Marital status: Single Are you sexually active?: No What is your sexual orientation?: Heterosexual Has your sexual activity been affected by drugs, alcohol, medication, or emotional stress?: N/A Does patient have children?: No  Childhood History:  By whom was/is the patient raised?: Both parents Description of patient's relationship with caregiver when they were a child: " yes" How were you disciplined when you got in trouble as a child/adolescent?: pt did not say Does patient have siblings?: Yes Number of Siblings: 2 Description of patient's current relationship with siblings: Pt reports strained relationship with family. Did patient suffer any verbal/emotional/physical/sexual abuse as a child?: No Did patient suffer from  severe childhood neglect?: No Has patient ever been sexually abused/assaulted/raped as an adolescent or adult?: No Was the patient ever a victim of a crime or a disaster?: No Witnessed domestic violence?: No Has patient been affected by domestic violence as an adult?: No  Education:  Highest grade of school patient has completed: 2 years of college Currently a Consulting civil engineer?: No Learning disability?: No  Employment/Work Situation:   Employment Situation: Unemployed Patient's Job has Been Impacted by Current Illness: No What is the Longest Time Patient has Held a Job?: couple years 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?: No  Financial Resources:   Financial resources: No income Does patient have a Lawyer or guardian?: No  Alcohol/Substance Abuse:   What has been your use of drugs/alcohol within the last 12 months?: beer occassionally If attempted suicide, did drugs/alcohol play a role in this?: No Alcohol/Substance Abuse Treatment Hx: Denies past history Has alcohol/substance abuse ever caused legal problems?: No  Social Support System:   Forensic psychologist System: None Type of faith/religion: Did not want to answer How does patient's faith help to cope with current illness?: Did not want to answer  Leisure/Recreation:   Do You Have Hobbies?: Yes Leisure and Hobbies: Refurbishing items.  Strengths/Needs:   What is the patient's perception of their strengths?: Organization and staying motivated Patient states they can use these personal strengths during their treatment to contribute to their recovery: None Reported Patient states these barriers may affect/interfere with their treatment: None Reported Patient states these barriers may affect their return to the community: None Reported Other important information patient would like considered in planning for their treatment: N/A  Discharge Plan:    Currently receiving community mental health services: No Patient states concerns and preferences for aftercare planning are: None  Reported Patient states they will know when they are safe and ready for discharge when: none reported Does patient have access to transportation?: Yes Does patient have financial barriers related to discharge medications?: Yes Patient description of barriers related to discharge medications: No income or insurance Will patient be returning to same living situation after discharge?: Yes  Summary/Recommendations:   Summary and Recommendations (to be completed by the evaluator): Levester is a 42 year old male who was admitted to [facility] for increased paranoia.  He was admitted to [facility] last week.  Pt states that stressors have not changed.  He continues to be homeless and live out of his car.  He reports that he has no social supports.  He would like continued medication stabilization.  Patient reports that he is unemployed and has no insurance.  Patient denies ongoing drug use.  Patient was connected to [facility] but missed his appointment today because of hospitalization.  While here, [place] can benefit from crisis stabilization, medication management, therapeutic milieu, and referrals for services.   Janny Crute E Langston [place]. 02/27/2023

## 2023-02-27 NOTE — Group Note (Signed)
Recreation Therapy Group Note   Group Topic:Problem Solving  Group Date: 02/27/2023 Start Time: 0930 End Time: 1000 Facilitators: Montine Hight-McCall, LRT,CTRS Location: [address] Day[address]   Goal Area(s) Addresses:  Patient will effectively work with peer towards shared goal.  Patient will identify skills used to make activity successful.  Patient will share challenges and verbalize solution-driven approaches used. Patient will identify how skills used during activity can be used to reach post d/c goals.   Group Description: [employer]. Patients were provided the following materials: 4 drinking straws, 5 rubber bands, 5 paper clips, 2 index cards and 2 drinking cups. Using the provided materials patients were asked to build a launching mechanism to launch a ping pong ball across the room, approximately 10 feet. Patients were divided into teams of 3-5. Instructions required all materials be incorporated into the device, functionality of items left to the peer group's discretion.   Affect/Mood: Appropriate   Participation Level: Engaged   Participation Quality: Independent   Behavior: Appropriate   Speech/Thought Process: Focused   Insight: Good   Judgement: Good   Modes of Intervention: STEM Activity   Patient Response to Interventions:  Engaged   Education Outcome:  Acknowledges education   Clinical Observations/Individualized Feedback: Pt attended and participated in group.  Pt offered suggestions to the group so could complete the activity.     Plan: Continue to engage patient in RT group sessions 2-3x/week.   Graiden Henes-McCall, LRT,CTRS  02/27/2023 1:00 PM

## 2023-02-27 NOTE — Progress Notes (Signed)
   02/27/23 0800  Psych Admission Type (Psych Patients Only)  Admission Status Voluntary  Psychosocial Assessment  Patient Complaints None  Eye Contact Brief  Facial Expression Animated  Affect Appropriate to circumstance  Speech Logical/coherent  Interaction Assertive  Motor Activity Other (Comment)  Appearance/Hygiene Unremarkable  Behavior Characteristics Cooperative;Appropriate to situation  Mood Pleasant;Euthymic  Thought Process  Coherency WDL  Content WDL  Delusions None reported or observed  Perception WDL  Hallucination None reported or observed  Judgment Impaired  Confusion None  Danger to Self  Current suicidal ideation? Denies  Agreement Not to Harm Self Yes  Description of Agreement Verbal  Danger to Others  Danger to Others None reported or observed

## 2023-02-27 NOTE — Progress Notes (Signed)
   02/26/23 2140  Psych Admission Type (Psych Patients Only)  Admission Status Voluntary  Psychosocial Assessment  Patient Complaints Anxiety;Depression  Eye Contact Brief  Facial Expression Flat  Affect Flat  Speech Logical/coherent  Interaction Guarded;Isolative  Motor Activity Slow  Appearance/Hygiene Unremarkable  Behavior Characteristics Cooperative;Appropriate to situation  Mood Depressed;Anxious  Thought Process  Coherency WDL  Content WDL  Delusions None reported or observed  Perception WDL  Hallucination None reported or observed  Judgment Impaired  Confusion Moderate  Danger to Self  Current suicidal ideation? Denies  Agreement Not to Harm Self Yes  Description of Agreement Verbal  Danger to Others  Danger to Others None reported or observed   Pt was offered support and encouragement. Pt was given scheduled medications. Given PRN Trazodone per MAR. Pt was encouraged to attend groups. Q 15 minute checks were done for safety. Pt did not attend group, isolative to room. Minimally interactive with peers and staff. Pt has no complaints.Pt receptive to treatment and safety maintained on unit.

## 2023-02-27 NOTE — Progress Notes (Signed)
Adult Psychoeducational Group Note  Date:  02/27/2023 Time:  1:20 AM  Group Topic/Focus:  Wrap-Up Group:   The focus of this group is to help patients review their daily goal of treatment and discuss progress on daily workbooks.  Participation Level:  Did Not Attend  Participation Quality:  Appropriate  Affect:  Appropriate  Cognitive:  Appropriate  Insight: Appropriate  Engagement in Group:  None  Modes of Intervention:  Discussion  Additional Comments:  Pt did not attend group .   Joselyn Arrow 02/27/2023, 1:20 AM

## 2023-02-27 NOTE — Progress Notes (Signed)
Adult Psychoeducational Group Note  Date:  02/27/2023 Time:  10:30 PM  Group Topic/Focus:  Wrap-Up Group:   The focus of this group is to help patients review their daily goal of treatment and discuss progress on daily workbooks.  Participation Level:  Did Not Attend  Participation Quality:  Appropriate  Affect:  Appropriate  Cognitive:  Alert and Appropriate  Insight: Appropriate  Engagement in Group:  None  Modes of Intervention:  Discussion  Additional Comments:  Pt. Did not attend group.  Joselyn Arrow 02/27/2023, 10:30 PM

## 2023-02-27 NOTE — BH IP Treatment Plan (Signed)
Interdisciplinary Treatment and Diagnostic Plan Update  02/27/2023 Time of Session: 10:45 AM  William Romero MRN: [id number]  Principal Diagnosis: Schizophreniform psychosis (HCC)  Secondary Diagnoses: Principal Problem:   Schizophreniform psychosis (HCC) Active Problems:   Tobacco use disorder   Cannabis use disorder   Homelessness   Current Medications:  Current Facility-Administered Medications  Medication Dose Route Frequency Provider Last Rate Last Admin   acetaminophen (TYLENOL) tablet 650 mg  650 mg Oral Q6H PRN Ardis Hughs, NP       alum & mag hydroxide-simeth (MAALOX/MYLANTA) 200-200-20 MG/5ML suspension 30 mL  30 mL Oral Q4H PRN Ardis Hughs, NP       diphenhydrAMINE (BENADRYL) capsule 50 mg  50 mg Oral TID PRN Ardis Hughs, NP       Or   diphenhydrAMINE (BENADRYL) injection 50 mg  50 mg Intramuscular TID PRN Ardis Hughs, NP       DULoxetine (CYMBALTA) DR capsule 20 mg  20 mg Oral Daily Princess Bruins, DO       haloperidol (HALDOL) tablet 5 mg  5 mg Oral TID PRN Ardis Hughs, NP       Or   haloperidol lactate (HALDOL) injection 5 mg  5 mg Intramuscular TID PRN Ardis Hughs, NP       hydrOXYzine (ATARAX) tablet 25 mg  25 mg Oral TID PRN Ardis Hughs, NP   25 mg at 02/27/23 4098   LORazepam (ATIVAN) tablet 2 mg  2 mg Oral TID PRN Ardis Hughs, NP       Or   LORazepam (ATIVAN) injection 2 mg  2 mg Intramuscular TID PRN Ardis Hughs, NP       magnesium hydroxide (MILK OF MAGNESIA) suspension 30 mL  30 mL Oral Daily PRN Ardis Hughs, NP       OLANZapine (ZYPREXA) tablet 10 mg  10 mg Oral Seabron Spates, MD       traZODone (DESYREL) tablet 50 mg  50 mg Oral QHS PRN Ardis Hughs, NP   50 mg at 02/26/23 2139   PTA Medications: Medications Prior to Admission  Medication Sig Dispense Refill Last Dose   gabapentin (NEURONTIN) 300 MG capsule Take 1 capsule (300 mg total) by mouth 3 (three) times daily. 90  capsule 0    hydrOXYzine (ATARAX) 25 MG tablet Take 1 tablet (25 mg total) by mouth 3 (three) times daily as needed for anxiety. 60 tablet 0    OLANZapine (ZYPREXA) 5 MG tablet Take 1 tablet (5 mg total) by mouth 2 (two) times daily.      traZODone (DESYREL) 50 MG tablet Take 1 tablet (50 mg total) by mouth at bedtime as needed for sleep. 30 tablet 0     Patient Stressors:    Patient Strengths:    Treatment Modalities: Medication Management, Group therapy, Case management,  1 to 1 session with clinician, Psychoeducation, Recreational therapy.   Physician Treatment Plan for Primary Diagnosis: Schizophreniform psychosis (HCC) Long Term Goal(s):     Short Term Goals:    Medication Management: Evaluate patient's response, side effects, and tolerance of medication regimen.  Therapeutic Interventions: 1 to 1 sessions, Unit Group sessions and Medication administration.  Evaluation of Outcomes: Not Progressing  Physician Treatment Plan for Secondary Diagnosis: Principal Problem:   Schizophreniform psychosis (HCC) Active Problems:   Tobacco use disorder   Cannabis use disorder   Homelessness  Long Term Goal(s):     Short Term  Goals:       Medication Management: Evaluate patient's response, side effects, and tolerance of medication regimen.  Therapeutic Interventions: 1 to 1 sessions, Unit Group sessions and Medication administration.  Evaluation of Outcomes: Not Progressing   RN Treatment Plan for Primary Diagnosis: Schizophreniform psychosis (HCC) Long Term Goal(s): Knowledge of disease and therapeutic regimen to maintain health will improve  Short Term Goals: Ability to remain free from injury will improve, Ability to verbalize frustration and anger appropriately will improve, Ability to demonstrate self-control, Ability to participate in decision making will improve, Ability to verbalize feelings will improve, Ability to disclose and discuss suicidal ideas, Ability to identify  and develop effective coping behaviors will improve, and Compliance with prescribed medications will improve  Medication Management: RN will administer medications as ordered by provider, will assess and evaluate patient's response and provide education to patient for prescribed medication. RN will report any adverse and/or side effects to prescribing provider.  Therapeutic Interventions: 1 on 1 counseling sessions, Psychoeducation, Medication administration, Evaluate responses to treatment, Monitor vital signs and CBGs as ordered, Perform/monitor CIWA, COWS, AIMS and Fall Risk screenings as ordered, Perform wound care treatments as ordered.  Evaluation of Outcomes: Not Progressing   [name] Treatment Plan for Primary Diagnosis: Schizophreniform psychosis (HCC) Long Term Goal(s): Safe transition to appropriate next level of care at discharge, Engage patient in therapeutic group addressing interpersonal concerns.  Short Term Goals: Engage patient in aftercare planning with referrals and resources, Increase social support, Increase ability to appropriately verbalize feelings, Increase emotional regulation, Facilitate acceptance of mental health diagnosis and concerns, Facilitate patient progression through stages of change regarding substance use diagnoses and concerns, Identify triggers associated with mental health/substance abuse issues, and Increase skills for wellness and recovery  Therapeutic Interventions: Assess for all discharge needs, 1 to 1 time with Social worker, Explore available resources and support systems, Assess for adequacy in community support network, Educate family and significant other(s) on suicide prevention, Complete Psychosocial Assessment, Interpersonal group therapy.  Evaluation of Outcomes: Not Progressing   Progress in Treatment: Attending groups: Yes. Participating in groups: Yes. Taking medication as prescribed: Yes. Toleration medication: No. Stated that his  medication was not working and that is why he is back Family/Significant other contact made: No, will contact:  Pt declined consents  Patient understands diagnosis: Yes. Discussing patient identified problems/goals with staff: Yes. Medical problems stabilized or resolved: Yes. Denies suicidal/homicidal ideation: Yes. Issues/concerns per patient self-inventory: No.   New problem(s) identified: No, Describe:  None reported   New Short Term/Long Term Goal(s):  medication stabilization, elimination of SI thoughts, development of comprehensive mental wellness plan.    Patient Goals:  " get my paranoia under control with medication "   Discharge Plan or Barriers: Patient recently admitted. CSW will continue to follow and assess for appropriate referrals and possible discharge planning.    Reason for Continuation of Hospitalization: Anxiety Delusions  Depression Medication stabilization Other; describe Psychosis   Estimated Length of Stay: 3-5 days   Last 3 [place] Suicide Severity Risk Score: Flowsheet Row [facility]Current[facility] Most recent reading at 02/25/2023  5:31 PM ED from 02/24/2023 in [facility] Most recent reading at 02/24/2023 11:54 PM ED from 02/24/2023 in [facility] Emergency Department at [facility] Most recent reading at 02/24/2023 10:21 AM  C-SSRS RISK CATEGORY Low Risk High Risk No Risk       Last PHQ 2/9 Scores:  09/26/2022    3:43 PM 06/21/2022    8:20 AM 01/03/2022   11:30 AM  Depression screen PHQ 2/9  Decreased Interest 0 0 0  Down, Depressed, Hopeless 0 0 0  PHQ - 2 Score 0 0 0  Altered sleeping 0 0 0  Tired, decreased energy 0 0 0  Change in appetite 0 0 0  Feeling bad or failure about yourself  0 0 0  Trouble concentrating 0 0 0  Moving slowly or fidgety/restless 0 0 0  Suicidal thoughts 0 0 0  PHQ-9 Score 0 0 0  Difficult doing work/chores Not  difficult at all Not difficult at all Not difficult at all    Scribe for Treatment Team: Beather Arbour 02/27/2023 3:16 PM

## 2023-02-27 NOTE — Group Note (Signed)
Occupational Therapy Group Note  Group Topic: Sleep Hygiene  Group Date: 02/27/2023 Start Time: 1430 End Time: 1500 Facilitators: Ted Mcalpine, OT   Group Description: Group encouraged increased participation and engagement through topic focused on sleep hygiene. Patients reflected on the quality of sleep they typically receive and identified areas that need improvement. Group was given background information on sleep and sleep hygiene, including common sleep disorders. Group members also received information on how to improve one's sleep and introduced a sleep diary as a tool that can be utilized to track sleep quality over a length of time. Group session ended with patients identifying one or more strategies they could utilize or implement into their sleep routine in order to improve overall sleep quality.        Therapeutic Goal(s):  Identify one or more strategies to improve overall sleep hygiene  Identify one or more areas of sleep that are negatively impacted (sleep too much, too little, etc)     Participation Level: Minimal   Participation Quality: Independent   Behavior: Appropriate   Speech/Thought Process: Distracted   Affect/Mood: Flat   Insight: Limited   Judgement: Limited      Modes of Intervention: Education  Patient Response to Interventions:  Disengaged   Plan: Continue to engage patient in OT groups 2 - 3x/week.  02/27/2023  Ted Mcalpine, OT  Kerrin Champagne, OT

## 2023-02-27 NOTE — Progress Notes (Signed)
   02/27/23 0600  15 Minute Checks  Location Bedroom  Visual Appearance Calm  Behavior Sleeping  Sleep (Behavioral Health Patients Only)  Calculate sleep? (Click Yes once per 24 hr at 0600 safety check) Yes  Documented sleep last 24 hours 10.75

## 2023-02-28 DIAGNOSIS — F2081 Schizophreniform disorder: Secondary | ICD-10-CM

## 2023-02-28 MED ORDER — OLANZAPINE 7.5 MG PO TABS
15.0000 mg | ORAL_TABLET | Freq: Every day | ORAL | Status: DC
  Administered 2023-02-28 – 2023-03-04 (×5): 15 mg via ORAL
  Filled 2023-02-28 (×8): qty 2

## 2023-02-28 MED ORDER — DULOXETINE HCL 60 MG PO CPEP
60.0000 mg | ORAL_CAPSULE | Freq: Every day | ORAL | Status: DC
  Administered 2023-03-01 – 2023-03-06 (×6): 60 mg via ORAL
  Filled 2023-02-28 (×7): qty 1
  Filled 2023-02-28: qty 7
  Filled 2023-02-28: qty 1

## 2023-02-28 NOTE — Progress Notes (Signed)
   02/28/23 0800  Psych Admission Type (Psych Patients Only)  Admission Status Voluntary  Psychosocial Assessment  Patient Complaints Anxiety;Isolation  Eye Contact Brief  Facial Expression Flat  Affect Flat  Speech Logical/coherent  Interaction Guarded;Isolative  Motor Activity Shuffling;Slow  Appearance/Hygiene Unremarkable  Behavior Characteristics Cooperative;Appropriate to situation  Mood Depressed;Anxious  Thought Process  Coherency WDL  Content WDL  Delusions None reported or observed  Perception WDL  Hallucination None reported or observed  Judgment Poor  Confusion None  Danger to Self  Current suicidal ideation? Denies  Self-Injurious Behavior No self-injurious ideation or behavior indicators observed or expressed   Agreement Not to Harm Self Yes  Description of Agreement verbal  Danger to Others  Danger to Others None reported or observed

## 2023-02-28 NOTE — Group Note (Signed)
Date:  02/28/2023 Time:  12:20 PM  Group Topic/Focus:  Coping With Mental Health Crisis:   The purpose of this group is to help patients identify strategies for coping with mental health crisis.  Group discusses possible causes of crisis and ways to manage them effectively. Early Warning Signs:   The focus of this group is to help patients identify signs or symptoms they exhibit before slipping into an unhealthy state or crisis.    Participation Level:  Did Not Attend  Participation Quality:    Affect:    Cognitive:    Insight:   Engagement in Group:    Modes of Intervention:    Additional Comments:    William Romero 02/28/2023, 12:20 PM  

## 2023-02-28 NOTE — Progress Notes (Signed)
[facility] MD Progress Note  Date: 02/28/2023 1:52 PM Name: William Romero Romero  DOB: July 20, 1981 MRN: [id number] Unit: [id number]  CC: psychosis  REASON FOR ADMISSION   Dvon "William Romero" Romero is a 42 y.o. male with PMH of unspecified schizophreniform vs substance induced psychosis MDD, ADHD, cannabis use d/o, tobacco use d/o, housing instability, who presented voluntary to [facility] (02/24/2023) for weakness then presented to [facility] (02/24/2023), then admitted to [facility] Voluntary for paranoia in the setting of THC use. Prior to this, he was recently admitted to [facility] 02/14/2023-02/22/2023 and discharged to self. On 02/23/2023, he then presented to [facility] and was discharged again.   Principal Problem: Schizophreniform psychosis (HCC) Diagnosis: Principal Problem:   Schizophreniform psychosis (HCC) Active Problems:   Tobacco use disorder   Cannabis use disorder   Homelessness  CHART REVIEW FROM LAST 24 HOURS   Adherent to scheduled meds: yes  Agitation PRNs: none  Per nursing staff: no acute behavioral concerns  Groups: yes, engaged  Documented sleep last 24 hours: 7.25   SUBJECTIVE  Patient was initially seen interacting in group. During evaluation, patient was physically more calm and engaged.  He continues to feel "great", but is still paranoid about his family, mainly his grandma. Asking if I think that she is a liability to future jobs.  Stated that he did have some AH this AM, but he was able to ignore it.  Reported that his appetite was not good this AM, but good last night. His sleep was interrupted with nightmares.   Denied active and passive SI/HI/VH.  Endorsed persecutory possibly paranoid delusions, AH  Review of Systems  Constitutional:  Negative for malaise/fatigue.  Respiratory:  Negative for shortness of breath.   Cardiovascular:  Negative for chest pain.  Gastrointestinal:  Negative for abdominal pain, constipation, diarrhea, nausea and vomiting.  Neurological:   Negative for dizziness and headaches.   HISTORY  Past Psychiatric History:  Diagnoses: MDD, ADHD and schizophrenia Inpatient treatment: ~6 between 2012-2019 Suicide: Denied Homicide: Denied Medication history: Unknown Medication compliance: Poor, except to adderall   Past Medical/Surgical History:  Medical Diagnoses:NA Prior Hosp: NA Prior Surgeries/Trauma: NA Concussions/Head Trauma/LOC:Denied Seizures: Denied PCP: Philip Aspen, Limmie Patricia, MD  Allergies: Patient has no known allergies.    Family Psychiatric History:  Denied    Social History:  Housing: unhoused Finances: unemployed Children: NA Guns/Weapons: Denied Legal: jail - reported for self defense   Substance Use History: Alcohol: last drink was ~5 d prior to admission, 2-3 beers. Prior to that ~3 weeks prior was 1 pint of whiskey  Nicotine:  reports that he has quit smoking. His smoking use included cigarettes. He smoked an average of .5 packs per day. He has never used smokeless tobacco. Marijuana: Yes IV drug use: Denied Stimulants: Possible stimulant misuse  Opiates: Denied Sedative/hypnotics: Denied Hallucinogens: Denied H/O withdrawals, blackouts: Denied H/O DT: Denied H/O Detox / Rehab: Denied DUI/DWI: Denied  Past Medical History:  Past Medical History:  Diagnosis Date   Schizophrenia (HCC)     Past Surgical History:  Procedure Laterality Date   APPENDECTOMY     Family History:  History reviewed. No pertinent family history. Social History:  Social History   Substance and Sexual Activity  Alcohol Use Not Currently   Alcohol/week: 5.0 standard drinks of alcohol   Types: 5 Cans of beer per week     Social History   Substance and Sexual Activity  Drug Use No    Social History  Socioeconomic History   Marital status: Single    Spouse name: Not on file   Number of children: Not on file   Years of education: Not on file   Highest education level: Not on file  Occupational  History   Not on file  Tobacco Use   Smoking status: Former    Packs/day: .5    Types: Cigarettes   Smokeless tobacco: Never  Vaping Use   Vaping Use: Never used  Substance and Sexual Activity   Alcohol use: Not Currently    Alcohol/week: 5.0 standard drinks of alcohol    Types: 5 Cans of beer per week   Drug use: No   Sexual activity: Not on file  Other Topics Concern   Not on file  Social History Narrative   Not on file   Social Determinants of Health   Financial Resource Strain: Not on file  Food Insecurity: No Food Insecurity (02/25/2023)   Hunger Vital Sign    Worried About Running Out of Food in the Last Year: Never true    Ran Out of Food in the Last Year: Never true  Recent Concern: Food Insecurity - Food Insecurity Present (02/24/2023)   Hunger Vital Sign    Worried About Running Out of Food in the Last Year: Often true    Ran Out of Food in the Last Year: Sometimes true  Transportation Needs: No Transportation Needs (02/25/2023)   PRAPARE - Administrator, Civil Service (Medical): No    Lack of Transportation (Non-Medical): No  Physical Activity: Not on file  Stress: Not on file  Social Connections: Not on file   Additional Social History:                         Current Medications: Current Facility-Administered Medications  Medication Dose Route Frequency Provider Last Rate Last Admin   acetaminophen (TYLENOL) tablet 650 mg  650 mg Oral Q6H PRN Ardis Hughs, NP   650 mg at 02/27/23 1605   alum & mag hydroxide-simeth (MAALOX/MYLANTA) 200-200-20 MG/5ML suspension 30 mL  30 mL Oral Q4H PRN Ardis Hughs, NP       diphenhydrAMINE (BENADRYL) capsule 50 mg  50 mg Oral TID PRN Ardis Hughs, NP       Or   diphenhydrAMINE (BENADRYL) injection 50 mg  50 mg Intramuscular TID PRN Ardis Hughs, NP       [START ON 03/01/2023] DULoxetine (CYMBALTA) DR capsule 60 mg  60 mg Oral Daily Princess Bruins, DO       haloperidol (HALDOL)  tablet 5 mg  5 mg Oral TID PRN Ardis Hughs, NP       Or   haloperidol lactate (HALDOL) injection 5 mg  5 mg Intramuscular TID PRN Ardis Hughs, NP       hydrOXYzine (ATARAX) tablet 25 mg  25 mg Oral TID PRN Ardis Hughs, NP   25 mg at 02/27/23 1818   LORazepam (ATIVAN) tablet 2 mg  2 mg Oral TID PRN Ardis Hughs, NP       Or   LORazepam (ATIVAN) injection 2 mg  2 mg Intramuscular TID PRN Ardis Hughs, NP       magnesium hydroxide (MILK OF MAGNESIA) suspension 30 mL  30 mL Oral Daily PRN Ardis Hughs, NP       OLANZapine (ZYPREXA) tablet 15 mg  15 mg Oral QHS Princess Bruins, DO  OBJECTIVE  BP (!) 122/90 (BP Location: Right Arm)   Pulse 92   Temp 98.8 F (37.1 C) (Oral)   Resp 18   Ht 6' (1.829 m)   Wt 97.2 kg   SpO2 100%   BMI 29.05 kg/m   Physical Exam Physical Exam Vitals and nursing note reviewed.  Constitutional:      General: He is not in acute distress.    Appearance: He is not ill-appearing, toxic-appearing or diaphoretic.  HENT:     Head: Normocephalic.  Pulmonary:     Effort: Pulmonary effort is normal. No respiratory distress.  Neurological:     Mental Status: He is alert.     AIMS:   ,  ,  ,  ,      Psychiatric Specialty Exam: Presentation General Appearance: Appropriate for Environment; Casual; Fairly Groomed  Eye Contact: Good  Speech: Clear and Coherent; Normal Rate  Volume: Normal  Handedness:Right  Mood and Affect  Mood: Anxious  Affect: Congruent; Appropriate; Restricted  Thought Process  Thought Process: Coherent; Goal Directed; Linear  Descriptions of Associations: Intact  Thought Content Suicidal Thoughts:No With Intent; Without Plan Without Plan  Homicidal Thoughts:No Without Plan  Hallucinations:Auditory .  Ideas of Reference:None  Thought Content:Rumination; Delusions  Sensorium  Memory:Immediate Fair; Recent Fair  Judgment:Fair  Insight:Shallow   Executive Functions   Orientation:Full (Time, Place and Person)  Language:Good  Concentration:Good  Attention:Good  Recall:Good  Fund of Knowledge:Good  Psychomotor Activity  Psychomotor Activity:Normal  Assets  Assets:Communication Skills; Desire for Improvement  Sleep  Quality:Fair  Documented sleep last 24 hours: 7.25   Lab Results:  No results found for this or any previous visit (from the past 48 hour(s)).  Blood Alcohol level:  Lab Results  Component Value Date   ETH <10 02/24/2023   ETH <10 02/23/2023   Metabolic Disorder Labs: Lab Results  Component Value Date   HGBA1C 5.0 02/13/2023   MPG 96.8 02/13/2023   No results found for: "PROLACTIN" Lab Results  Component Value Date   CHOL 150 02/13/2023   TRIG 100 02/13/2023   HDL 53 02/13/2023   CHOLHDL 2.8 02/13/2023   VLDL 20 02/13/2023   LDLCALC 77 02/13/2023   ASSESSMENT / PLAN  Diagnoses / Active Problems: Principal Problem:   Schizophreniform psychosis (HCC) Active Problems:   Tobacco use disorder   Cannabis use disorder   Homelessness  Safety and Monitoring: Voluntary admission to inpatient psychiatric unit for safety, stabilization and treatment    2. Psychiatric Diagnoses and Treatment:  Schizophreniform vs Substance induced psychosis  Cannabis use d/o  Housing instability  Malingering Presented with psychosis x3-17mo, unclear etiology. Broad ddx, including substance induced, acute stress given his years of psychiatric stability (chart shows regular visit to PCP for ADHD and only few recent ED visits over the past year). He was recently discharged from [facility] (6/11-6/19) and returned for paranoia in the setting of THC use. Had mild AH, and paranoia is still decent. Will increase zyprexa per below. Activating side effects resolved, will increase cymbalta for anxiety. Encouraged cessation     INCREASED cymbalta 20 mg to 60 mg daily INCREASED zyprexa 10 mg to 15 mg at bedtime DISCONTINUED trazodone PRN - nightmare  side effects  Tobacco use d/o  NRTs Encouraged cessation   Stimulant misuse d/o  Reported adherent to adderall for years and reported still taking it, however UDS negative for amphetamines. Possible diversion, maybe he didn't take it 24h prior to UDS. Discontinued home adderall 20mg  +  10mg  daily   3. Medical Issues Being Addressed:  NA   4. Routine and other pertinent labs:   BMI:29.05 CMP Ca  8.4, ALT 52 CBC WNL PT/INR/PTT WNL CTH no acute processes  Qtc 470 UDS+THC BAL <10  5. Disposition Planning:  Tentative Date: 6/27 Barrier: psychosis Location: shelter  Treatment Plan Summary: I certify that inpatient services furnished can reasonably be expected to improve the patient's condition.   Daily contact with patient to assess and evaluate symptoms and progress in treatment and Medication management Daily contact with patient to assess and evaluate symptoms and progress in treatment Patient's case to be discussed in multi-disciplinary team meeting Observation Level: q15 minute checks  Vital signs: q12 hours Precautions: suicide, elopement, and assault The risks/benefits/side-effects/alternatives to this medication were discussed in detail with the patient and time was given for questions. The patient consents to medication trial. The patient consents to medication trial. FDA black box warnings, if present, were discussed. Metabolic profile and EKG monitoring obtained while on an atypical antipsychotic  Encouraged patient to participate in unit milieu and in scheduled group therapies   Total Time spent with patient:  Patient's case was discussed with Attending, see attestation for more information.   Signed: Princess Bruins, DO PGY-2 [facility]  [address] [place], [place] [address] Ph: [phone] Fax: [phone] Patient ID: SANTO ZAHRADNIK, male   DOB: 1981/05/12, 42 y.o.   MRN: [id number]

## 2023-02-28 NOTE — Group Note (Signed)
Recreation Therapy Group Note   Group Topic:Animal Assisted Therapy   Group Date: 02/28/2023 Start Time: 0945 End Time: 1031 Facilitators: Savannaha Stonerock-McCall, LRT,CTRS Location: [address] Dayroom   Animal-Assisted Activity (AAA) Program Checklist/Progress Notes Patient Eligibility Criteria Checklist & Daily Group note for Rec Tx Intervention  AAA/T Program Assumption of Risk Form signed by Patient/ or Parent Legal Guardian Yes  Patient understands his/her participation is voluntary Yes   Affect/Mood: N/A   Participation Level: Did not attend    Clinical Observations/Individualized Feedback:     Plan: Continue to engage patient in RT group sessions 2-3x/week.   Raeshaun Simson-McCall, LRT,CTRS 02/28/2023 1:32 PM

## 2023-03-01 DIAGNOSIS — F2081 Schizophreniform disorder: Secondary | ICD-10-CM | POA: Diagnosis not present

## 2023-03-01 MED ORDER — LISINOPRIL 5 MG PO TABS
5.0000 mg | ORAL_TABLET | Freq: Every day | ORAL | Status: DC
  Administered 2023-03-01 – 2023-03-05 (×5): 5 mg via ORAL
  Filled 2023-03-01 (×2): qty 1
  Filled 2023-03-01: qty 7
  Filled 2023-03-01 (×6): qty 1

## 2023-03-01 NOTE — Progress Notes (Signed)
   03/01/23 2108  Psych Admission Type (Psych Patients Only)  Admission Status Voluntary  Psychosocial Assessment  Patient Complaints Anxiety  Eye Contact Fair  Facial Expression Anxious;Sad  Affect Anxious  Speech Logical/coherent  Interaction Guarded  Motor Activity Slow  Appearance/Hygiene In scrubs  Behavior Characteristics Cooperative;Appropriate to situation  Mood Pleasant;Depressed;Anxious  Thought Process  Coherency WDL  Content WDL  Delusions None reported or observed  Perception WDL  Hallucination None reported or observed  Judgment Poor  Confusion None  Danger to Self  Current suicidal ideation? Denies  Self-Injurious Behavior No self-injurious ideation or behavior indicators observed or expressed   Agreement Not to Harm Self Yes  Description of Agreement verbal  Danger to Others  Danger to Others None reported or observed

## 2023-03-01 NOTE — [facility] Group Notes (Signed)
Psychoeducational Group Note  Date:  03/01/2023 Time:  2000  Group Topic/Focus:  Narcotics Anonymous Meeting.   Participation Level: Did Not Attend  Participation Quality:  Not Applicable  Affect:  Not Applicable  Cognitive:  Not Applicable  Insight:  Not Applicable  Engagement in Group: Not Applicable  Additional Comments:  Did not attend.   Marcille Buffy 03/01/2023, 10:29 PM

## 2023-03-01 NOTE — Progress Notes (Signed)
   03/01/23 0900  Psych Admission Type (Psych Patients Only)  Admission Status Voluntary  Psychosocial Assessment  Patient Complaints Anxiety;Isolation  Eye Contact Fair  Facial Expression Anxious;Sad  Affect Anxious  Speech Logical/coherent;Soft  Interaction Guarded;Isolative  Motor Activity Slow  Appearance/Hygiene In scrubs  Behavior Characteristics Cooperative;Appropriate to situation  Mood Depressed;Anxious  Thought Process  Coherency WDL  Content WDL  Delusions None reported or observed  Perception WDL  Hallucination None reported or observed  Judgment Poor  Confusion None  Danger to Self  Current suicidal ideation? Denies  Self-Injurious Behavior No self-injurious ideation or behavior indicators observed or expressed   Agreement Not to Harm Self Yes  Description of Agreement verbal  Danger to Others  Danger to Others None reported or observed

## 2023-03-01 NOTE — Progress Notes (Signed)
Adult Psychoeducational Group Note  Date:  03/01/2023 Time:  1:36 AM  Group Topic/Focus:  Wrap-Up Group:   The focus of this group is to help patients review their daily goal of treatment and discuss progress on daily workbooks.  Participation Level:  Did Not Attend  Participation Quality:  Appropriate  Affect:  Appropriate  Cognitive:  Appropriate  Insight: Appropriate  Engagement in Group:  No  Modes of Intervention:  Discussion  Additional Comments:  Pt did not attend group.  Joselyn Arrow 03/01/2023, 1:36 AM

## 2023-03-01 NOTE — Progress Notes (Addendum)
[facility] MD Progress Note  Date: 03/01/2023 1:34 PM Name: William Romero  DOB: February 09, 1981 MRN: [id number] Unit: [id number]  CC: psychosis  REASON FOR ADMISSION   William Romero is a 42 y.o. male with PMH of unspecified schizophreniform vs substance induced psychosis MDD, ADHD, cannabis use d/o, tobacco use d/o, housing instability, who presented voluntary to [facility] (02/24/2023) for weakness then presented to [facility] (02/24/2023), then admitted to [facility] Voluntary for paranoia in the setting of THC use. Prior to this, he was recently admitted to [facility] 02/14/2023-02/22/2023 and discharged to self. On 02/23/2023, he then presented to [facility] and was discharged again.   Principal Problem: Schizophreniform psychosis (HCC) Diagnosis: Principal Problem:   Schizophreniform psychosis (HCC) Active Problems:   Tobacco use disorder   Cannabis use disorder   Homelessness  CHART REVIEW FROM LAST 24 HOURS   Adherent to scheduled meds: yes  Agitation PRNs: none  Per nursing staff: no acute behavioral concerns  Groups: yes, engaged  Documented sleep last 24 hours: 7.5   SUBJECTIVE  Patient was initially seen in bed. Nursing staff reported patient was fatigued in the morning. During evaluation patient was calm and engaged.  He feels well and has less paranoia. He has some concerns that his auditory hallucinations will be a "liability" to future improvement. Most recent auditory hallucination was yesterday. He did not recall the content of the hallucination because he can now "just ignore them." Patient reports auditory hallucinations occur less than 5 times a day.  Reported that appetite continue to be low, unchanged from yesterday.  Reported sleeping well last night. Denies nightmares after stopping trazodone.  Denied active and passive SI/HI/VH. Last AH was  6/25 AM.  Review of Systems  Constitutional:  Negative for malaise/fatigue.  Respiratory:  Negative for shortness of breath.    Cardiovascular:  Negative for chest pain.  Gastrointestinal:  Negative for abdominal pain, constipation, diarrhea, nausea and vomiting.  Neurological:  Negative for dizziness, tremors and headaches.   HISTORY  Past Psychiatric History:  Diagnoses: MDD, ADHD and schizophrenia Inpatient treatment: ~6 between 2012-2019 Suicide: Denied Homicide: Denied Medication history: Unknown Medication compliance: Poor, except to adderall   Past Medical/Surgical History:  Medical Diagnoses:NA Prior Hosp: NA Prior Surgeries/Trauma: NA Concussions/Head Trauma/LOC:Denied Seizures: Denied PCP: Philip Aspen, Limmie Patricia, MD  Allergies: Patient has no known allergies.    Family Psychiatric History:  Denied    Social History:  Housing: unhoused Finances: unemployed Children: NA Guns/Weapons: Denied Legal: jail - reported for self defense   Substance Use History: Alcohol: last drink was ~5 d prior to admission, 2-3 beers. Prior to that ~3 weeks prior was 1 pint of whiskey  Nicotine:  reports that he has quit smoking. His smoking use included cigarettes. He smoked an average of .5 packs per day. He has never used smokeless tobacco. Marijuana: Yes IV drug use: Denied Stimulants: Possible stimulant misuse  Opiates: Denied Sedative/hypnotics: Denied Hallucinogens: Denied H/O withdrawals, blackouts: Denied H/O DT: Denied H/O Detox / Rehab: Denied DUI/DWI: Denied  Past Medical History:  Past Medical History:  Diagnosis Date   Schizophrenia (HCC)     Past Surgical History:  Procedure Laterality Date   APPENDECTOMY     Family History:  History reviewed. No pertinent family history. Social History:  Social History   Substance and Sexual Activity  Alcohol Use Not Currently   Alcohol/week: 5.0 standard drinks of alcohol   Types: 5 Cans of beer per week  Social History   Substance and Sexual Activity  Drug Use No    Social History   Socioeconomic History   Marital status:  Single    Spouse name: Not on file   Number of children: Not on file   Years of education: Not on file   Highest education level: Not on file  Occupational History   Not on file  Tobacco Use   Smoking status: Former    Packs/day: .5    Types: Cigarettes   Smokeless tobacco: Never  Vaping Use   Vaping Use: Never used  Substance and Sexual Activity   Alcohol use: Not Currently    Alcohol/week: 5.0 standard drinks of alcohol    Types: 5 Cans of beer per week   Drug use: No   Sexual activity: Not on file  Other Topics Concern   Not on file  Social History Narrative   Not on file   Social Determinants of Health   Financial Resource Strain: Not on file  Food Insecurity: No Food Insecurity (02/25/2023)   Hunger Vital Sign    Worried About Running Out of Food in the Last Year: Never true    Ran Out of Food in the Last Year: Never true  Recent Concern: Food Insecurity - Food Insecurity Present (02/24/2023)   Hunger Vital Sign    Worried About Running Out of Food in the Last Year: Often true    Ran Out of Food in the Last Year: Sometimes true  Transportation Needs: No Transportation Needs (02/25/2023)   PRAPARE - Administrator, Civil Service (Medical): No    Lack of Transportation (Non-Medical): No  Physical Activity: Not on file  Stress: Not on file  Social Connections: Not on file   Additional Social History:                         Current Medications: Current Facility-Administered Medications  Medication Dose Route Frequency Provider Last Rate Last Admin   acetaminophen (TYLENOL) tablet 650 mg  650 mg Oral Q6H PRN Ardis Hughs, NP   650 mg at 03/01/23 1102   alum & mag hydroxide-simeth (MAALOX/MYLANTA) 200-200-20 MG/5ML suspension 30 mL  30 mL Oral Q4H PRN Ardis Hughs, NP       diphenhydrAMINE (BENADRYL) capsule 50 mg  50 mg Oral TID PRN Ardis Hughs, NP       Or   diphenhydrAMINE (BENADRYL) injection 50 mg  50 mg Intramuscular TID  PRN Ardis Hughs, NP       DULoxetine (CYMBALTA) DR capsule 60 mg  60 mg Oral Daily Princess Bruins, DO   60 mg at 03/01/23 0815   haloperidol (HALDOL) tablet 5 mg  5 mg Oral TID PRN Ardis Hughs, NP       Or   haloperidol lactate (HALDOL) injection 5 mg  5 mg Intramuscular TID PRN Ardis Hughs, NP       hydrOXYzine (ATARAX) tablet 25 mg  25 mg Oral TID PRN Ardis Hughs, NP   25 mg at 03/01/23 1102   lisinopril (ZESTRIL) tablet 5 mg  5 mg Oral QHS Princess Bruins, DO       LORazepam (ATIVAN) tablet 2 mg  2 mg Oral TID PRN Ardis Hughs, NP       Or   LORazepam (ATIVAN) injection 2 mg  2 mg Intramuscular TID PRN Ardis Hughs, NP  magnesium hydroxide (MILK OF MAGNESIA) suspension 30 mL  30 mL Oral Daily PRN Ardis Hughs, NP       OLANZapine (ZYPREXA) tablet 15 mg  15 mg Oral QHS Princess Bruins, DO   15 mg at 02/28/23 2114    OBJECTIVE  BP (!) 114/94 (BP Location: Right Arm)   Pulse 67   Temp (!) 97.2 F (36.2 C) (Oral)   Resp 18   Ht 6' (1.829 m)   Wt 97.2 kg   SpO2 100%   BMI 29.05 kg/m   Physical Exam Physical Exam Vitals and nursing note reviewed.  Constitutional:      General: He is not in acute distress.    Appearance: He is not ill-appearing, toxic-appearing or diaphoretic.  HENT:     Head: Normocephalic.  Pulmonary:     Effort: Pulmonary effort is normal. No respiratory distress.  Neurological:     Mental Status: He is alert.     AIMS:   ,  ,  ,  ,      Psychiatric Specialty Exam: Presentation General Appearance: Appropriate for Environment; Casual; Fairly Groomed  Eye Contact: Good  Speech: Clear and Coherent; Normal Rate  Volume: Normal  Handedness:Right  Mood and Affect  Mood: Anxious  Affect: Congruent; Appropriate; Restricted  Thought Process  Thought Process: Coherent; Goal Directed; Linear  Descriptions of Associations: Intact  Thought Content Suicidal Thoughts:No With Intent; Without Plan Without  Plan  Homicidal Thoughts:No Without Plan  Hallucinations:Auditory .  Ideas of Reference:None  Thought Content:Rumination; Delusions  Sensorium  Memory:Immediate Fair; Recent Fair  Judgment:Fair  Insight:Shallow   Executive Functions  Orientation:Full (Time, Place and Person)  Language:Good  Concentration:Good  Attention:Good  Recall:Good  Fund of Knowledge:Good  Psychomotor Activity  Psychomotor Activity:Normal  Assets  Assets:Communication Skills; Desire for Improvement  Sleep  Quality:Fair  Documented sleep last 24 hours: 7.5   Lab Results:  No results found for this or any previous visit (from the past 48 hour(s)).  Blood Alcohol level:  Lab Results  Component Value Date   ETH <10 02/24/2023   ETH <10 02/23/2023   Metabolic Disorder Labs: Lab Results  Component Value Date   HGBA1C 5.0 02/13/2023   MPG 96.8 02/13/2023   No results found for: "PROLACTIN" Lab Results  Component Value Date   CHOL 150 02/13/2023   TRIG 100 02/13/2023   HDL 53 02/13/2023   CHOLHDL 2.8 02/13/2023   VLDL 20 02/13/2023   LDLCALC 77 02/13/2023   ASSESSMENT / PLAN  Diagnoses / Active Problems: Principal Problem:   Schizophreniform psychosis (HCC) Active Problems:   Tobacco use disorder   Cannabis use disorder   Homelessness  Safety and Monitoring: Voluntary admission to inpatient psychiatric unit for safety, stabilization and treatment    2. Psychiatric Diagnoses and Treatment:  Schizophreniform vs Substance induced psychosis  Cannabis use d/o  Housing instability  Malingering Presented with psychosis x3-72mo, unclear etiology. Broad ddx, including substance induced, acute stress given his years of psychiatric stability (chart shows regular visit to PCP for ADHD and only few recent ED visits over the past year). He was recently discharged from [facility] (6/11-6/19) and returned for paranoia in the setting of THC use. No AH x24h and no SI x48h. No side effects  thus far. Encouraged cessation     Continue cymbalta 60 mg po daily  Continue zyprexa 15 mg po at bedtime  Tobacco use d/o  NRTs Encouraged cessation   Stimulant misuse d/o  Reported adherent to  adderall for years and reported still taking it, however UDS negative for amphetamines. Possible diversion, maybe he didn't take it 24h prior to UDS. Discontinued home adderall 20mg  + 10mg  daily   3. Medical Issues Being Addressed:  HTN Likely 2/2 cymbalta, since BP was normotensive prior to starting cymbalta STARTED lisinopril  mg qHS   4. Routine and other pertinent labs:   BMI:29.05 CMP Ca  8.4, ALT 52 CBC WNL PT/INR/PTT WNL CTH no acute processes  Qtc 470 UDS+THC BAL <10  5. Disposition Planning:  Tentative Date: 6/27 Barrier: psychosis Location: shelter  Treatment Plan Summary: I certify that inpatient services furnished can reasonably be expected to improve the patient's condition.   Daily contact with patient to assess and evaluate symptoms and progress in treatment and Medication management Daily contact with patient to assess and evaluate symptoms and progress in treatment Patient's case to be discussed in multi-disciplinary team meeting Observation Level: q15 minute checks  Vital signs: q12 hours Precautions: suicide, elopement, and assault The risks/benefits/side-effects/alternatives to this medication were discussed in detail with the patient and time was given for questions. The patient consents to medication trial. The patient consents to medication trial. FDA black box warnings, if present, were discussed. Metabolic profile and EKG monitoring obtained while on an atypical antipsychotic  Encouraged patient to participate in unit milieu and in scheduled group therapies   Total Time spent with patient:  Patient's case was discussed with Attending, see attestation for more information.   Signed: Ezekiel Slocumb, MS3 [facility]  Attestation   I was present for the entirety of the evaluation on 03/01/2023. I reviewed the patient's chart, and I participated in key portions of the service. I discussed the case with the medical student, and I agree with the assessment and plan of care as documented in the medical student's note. Case was discussed with attending.   Princess Bruins, DO Psych Resident

## 2023-03-01 NOTE — Group Note (Signed)
Recreation Therapy Group Note   Group Topic:Stress Management  Group Date: 03/01/2023 Start Time: 0935 End Time: 1000 Facilitators: Jerrilynn Mikowski-McCall, LRT,CTRS Location: [address] Dayroom   Goal Area(s) Addresses:  Patient will actively participate in stress management techniques presented during session.  Patient will successfully identify benefit of practicing stress management post d/c.   Group Description: Guided Imagery. LRT provided education, instruction, and demonstration on practice of visualization via guided imagery. Patient was asked to participate in the technique introduced during session. LRT debriefed including topics of mindfulness, stress management and specific scenarios each patient could use these techniques. Patients were given suggestions of ways to access scripts post d/c and encouraged to explore Youtube and other apps available on smartphones, tablets, and computers.   Affect/Mood: N/A   Participation Level: Did not attend    Clinical Observations/Individualized Feedback:      Plan: Continue to engage patient in RT group sessions 2-3x/week.   Delailah Spieth-McCall, LRT,CTRS 03/01/2023 11:59 AM

## 2023-03-01 NOTE — Plan of Care (Signed)
  Problem: Nutrition: Goal: Adequate nutrition will be maintained Outcome: Progressing   Problem: Coping: Goal: Level of anxiety will decrease Outcome: Progressing   Problem: Education: Goal: Knowledge of the prescribed therapeutic regimen will improve Outcome: Progressing   

## 2023-03-01 NOTE — Progress Notes (Signed)
[facility] [name] Progress Note   03/01/2023    3:06 PM  William Romero   [id number]   Type of Contact and Topic:   Homeless packet and employment resources  CSW provided patient with list homeless shelter resources and employment resources in the community to follow up with after discharge.     Signed:  Anson [place] MSW, [name], LCAS 03/01/2023 3:06 PM

## 2023-03-02 DIAGNOSIS — F2081 Schizophreniform disorder: Secondary | ICD-10-CM | POA: Diagnosis not present

## 2023-03-02 NOTE — Progress Notes (Signed)
   03/02/23 0530  15 Minute Checks  Location Bedroom  Visual Appearance Calm  Behavior Sleeping  Sleep (Behavioral Health Patients Only)  Calculate sleep? (Click Yes once per 24 hr at 0600 safety check) Yes  Documented sleep last 24 hours 8.25

## 2023-03-02 NOTE — Group Note (Signed)
Date:  03/02/2023 Time:  12:14 PM  Group Topic/Focus:  Recovery Goals:   The focus of this group is to identify appropriate goals for recovery and establish a plan to achieve them. Stages of Change:   The focus of this group is to explain the stages of change and help patients identify changes they want to make upon discharge.    Participation Level:  Did Not Attend  Participation Quality:    Affect:    Cognitive:    Insight:   Engagement in Group:    Modes of Intervention:    Additional Comments:    William Romero M [name] 03/02/2023, 12:14 PM  

## 2023-03-02 NOTE — Progress Notes (Addendum)
[facility] MD Progress Note  Date: 03/02/2023 2:47 PM Name: William Romero  DOB: 1981/06/23 MRN: [id number] Unit: [id number]  CC: psychosis  REASON FOR ADMISSION   William "Romero" GEHL is a 42 y.o. male with PMH of unspecified schizophreniform vs substance induced psychosis MDD, ADHD, cannabis use d/o, tobacco use d/o, housing instability, who presented voluntary to [facility] (02/24/2023) for weakness then presented to [facility] (02/24/2023), then admitted to [facility] Voluntary for paranoia in the setting of THC use. Prior to this, he was recently admitted to [facility] 02/14/2023-02/22/2023 and discharged to self. On 02/23/2023, he then presented to [facility] and was discharged again.   Principal Problem: Schizophreniform psychosis (HCC) Diagnosis: Principal Problem:   Schizophreniform psychosis (HCC) Active Problems:   Tobacco use disorder   Cannabis use disorder   Homelessness  CHART REVIEW FROM LAST 24 HOURS   Adherent to scheduled meds: yes  Agitation PRNs: none  Per nursing staff: no acute behavioral concerns  Groups: yes, engaged  Documented sleep last 24 hours: 8.25   SUBJECTIVE  Patient was initially seen in bed. During evaluation patient was calm and engaged.  He feels well and states that he is optimistic. He had questions regarding if the auditory hallucinations he was hearing could be real. Still having auditory. He states that he only experienced auditory hallucinations 2-3 times yesterday. Contents of the AH are insulting, but when asked for specifics he states that he cannot remember. Stated that he was able to easily ignore the [facility] Most recent auditory hallucination was yesterday.  He does note worrying that the voices are a liability to  new jobs, worried that the voices will take the information and given to someone else and that the voices sometimes puts ideas into his head. Stated that having the information moving from his head, rather than worried about hearing the voice  itself.  Reported that appetite continues to be low, unchanged from yesterday.  Reported sleeping well last night. Denies nightmares after stopping trazodone.  Denied active and passive SI/HI/VH today. Last AH was 6/26. Last SI 6/26 Endorses paranoia.He is still concerned that his auditory hallucinations will be a liability to future employment because they will broadcast his thoughts  Review of Systems  Constitutional:  Negative for malaise/fatigue.  Respiratory:  Negative for shortness of breath.   Cardiovascular:  Negative for chest pain and palpitations.  Gastrointestinal:  Negative for abdominal pain, constipation, diarrhea, nausea and vomiting.  Neurological:  Negative for dizziness, tremors and headaches.   HISTORY  Past Psychiatric History:  Diagnoses: MDD, ADHD and schizophrenia Inpatient treatment: ~6 between 2012-2019 Suicide: Denied Homicide: Denied Medication history: Unknown Medication compliance: Poor, except to adderall   Past Medical/Surgical History:  Medical Diagnoses:NA Prior Hosp: NA Prior Surgeries/Trauma: NA Concussions/Head Trauma/LOC:Denied Seizures: Denied PCP: Philip Aspen, Limmie Patricia, MD  Allergies: Patient has no known allergies.    Family Psychiatric History:  Denied    Social History:  Housing: unhoused Finances: unemployed Children: NA Guns/Weapons: Denied Legal: jail - reported for self defense   Substance Use History: Alcohol: last drink was ~5 d prior to admission, 2-3 beers. Prior to that ~3 weeks prior was 1 pint of whiskey  Nicotine:  reports that he has quit smoking. His smoking use included cigarettes. He smoked an average of .5 packs per day. He has never used smokeless tobacco. Marijuana: Yes IV drug use: Denied Stimulants: Possible stimulant misuse  Opiates: Denied Sedative/hypnotics: Denied Hallucinogens: Denied H/O withdrawals, blackouts: Denied H/O DT:  Denied H/O Detox / Rehab: Denied DUI/DWI: Denied  Past Medical  History:  Past Medical History:  Diagnosis Date   Schizophrenia [facility])     Past Surgical History:  Procedure Laterality Date   APPENDECTOMY     Family History:  History reviewed. No pertinent family history. Social History:  Social History   Substance and Sexual Activity  Alcohol Use Not Currently   Alcohol/week: 5.0 standard drinks of alcohol   Types: 5 Cans of beer per week     Social History   Substance and Sexual Activity  Drug Use No    Social History   Socioeconomic History   Marital status: Single    Spouse name: Not on file   Number of children: Not on file   Years of education: Not on file   Highest education level: Not on file  Occupational History   Not on file  Tobacco Use   Smoking status: Former    Packs/day: .5    Types: Cigarettes   Smokeless tobacco: Never  Vaping Use   Vaping Use: Never used  Substance and Sexual Activity   Alcohol use: Not Currently    Alcohol/week: 5.0 standard drinks of alcohol    Types: 5 Cans of beer per week   Drug use: No   Sexual activity: Not on file  Other Topics Concern   Not on file  Social History Narrative   Not on file   Social Determinants of Health   Financial Resource Strain: Not on file  Food Insecurity: No Food Insecurity (02/25/2023)   Hunger Vital Sign    Worried About Running Out of Food in the Last Year: Never true    Ran Out of Food in the Last Year: Never true  Recent Concern: Food Insecurity - Food Insecurity Present (02/24/2023)   Hunger Vital Sign    Worried About Running Out of Food in the Last Year: Often true    Ran Out of Food in the Last Year: Sometimes true  Transportation Needs: No Transportation Needs (02/25/2023)   PRAPARE - Administrator, Civil Service (Medical): No    Lack of Transportation (Non-Medical): No  Physical Activity: Not on file  Stress: Not on file  Social Connections: Not on file   Additional Social History:                         Current  Medications: Current Facility-Administered Medications  Medication Dose Route Frequency Provider Last Rate Last Admin   acetaminophen (TYLENOL) tablet 650 mg  650 mg Oral Q6H PRN Ardis Hughs, NP   650 mg at 03/01/23 2108   alum & mag hydroxide-simeth (MAALOX/MYLANTA) 200-200-20 MG/5ML suspension 30 mL  30 mL Oral Q4H PRN Ardis Hughs, NP       diphenhydrAMINE (BENADRYL) capsule 50 mg  50 mg Oral TID PRN Ardis Hughs, NP       Or   diphenhydrAMINE (BENADRYL) injection 50 mg  50 mg Intramuscular TID PRN Ardis Hughs, NP       DULoxetine (CYMBALTA) DR capsule 60 mg  60 mg Oral Daily Princess Bruins, DO   60 mg at 03/02/23 0741   haloperidol (HALDOL) tablet 5 mg  5 mg Oral TID PRN Ardis Hughs, NP       Or   haloperidol lactate (HALDOL) injection 5 mg  5 mg Intramuscular TID PRN Ardis Hughs, NP       hydrOXYzine (  ATARAX) tablet 25 mg  25 mg Oral TID PRN Ardis Hughs, NP   25 mg at 03/02/23 1324   lisinopril (ZESTRIL) tablet 5 mg  5 mg Oral QHS Princess Bruins, DO   5 mg at 03/01/23 2108   LORazepam (ATIVAN) tablet 2 mg  2 mg Oral TID PRN Ardis Hughs, NP       Or   LORazepam (ATIVAN) injection 2 mg  2 mg Intramuscular TID PRN Ardis Hughs, NP       magnesium hydroxide (MILK OF MAGNESIA) suspension 30 mL  30 mL Oral Daily PRN Ardis Hughs, NP       OLANZapine (ZYPREXA) tablet 15 mg  15 mg Oral QHS Princess Bruins, DO   15 mg at 03/01/23 2108    OBJECTIVE  BP 122/87 (BP Location: Left Arm)   Pulse 99   Temp 97.8 F (36.6 C) (Oral)   Resp 18   Ht 6' (1.829 m)   Wt 97.2 kg   SpO2 100%   BMI 29.05 kg/m   Physical Exam Physical Exam Vitals and nursing note reviewed.  Constitutional:      General: He is not in acute distress.    Appearance: He is not ill-appearing, toxic-appearing or diaphoretic.  HENT:     Head: Normocephalic.  Pulmonary:     Effort: Pulmonary effort is normal. No respiratory distress.  Neurological:     Mental  Status: He is alert.     AIMS:   ,  ,  ,  ,      Psychiatric Specialty Exam: Presentation General Appearance: Appropriate for Environment; Casual; Fairly Groomed  Eye Contact: Fair  Speech: Clear and Coherent; Normal Rate  Volume: Normal  Handedness:Right  Mood and Affect  Mood: Euthymic  Affect: Appropriate; Congruent; Full Range  Thought Process  Thought Process: Coherent; Goal Directed; Linear  Descriptions of Associations: Intact  Thought Content Suicidal Thoughts:No With Intent; Without Plan Without Plan  Homicidal Thoughts:No Without Plan  Hallucinations:Auditory Insulting - does not remember specific contents  Ideas of Reference:None  Thought Content:Rumination; Paranoid Ideation  Sensorium  Memory:Immediate Fair; Recent Fair  Judgment:Fair  Insight:Shallow   Executive Functions  Orientation:Full (Time, Place and Person)  Language:Good  Concentration:Good  Attention:Good  Recall:Good  Fund of Knowledge:Good  Psychomotor Activity  Psychomotor Activity:Normal  Assets  Assets:Communication Skills; Desire for Improvement  Sleep  Quality:Good  Documented sleep last 24 hours: 8.25   Lab Results:  No results found for this or any previous visit (from the past 48 hour(s)).  Blood Alcohol level:  Lab Results  Component Value Date   ETH <10 02/24/2023   ETH <10 02/23/2023   Metabolic Disorder Labs: Lab Results  Component Value Date   HGBA1C 5.0 02/13/2023   MPG 96.8 02/13/2023   No results found for: "PROLACTIN" Lab Results  Component Value Date   CHOL 150 02/13/2023   TRIG 100 02/13/2023   HDL 53 02/13/2023   CHOLHDL 2.8 02/13/2023   VLDL 20 02/13/2023   LDLCALC 77 02/13/2023   ASSESSMENT / PLAN  Diagnoses / Active Problems: Principal Problem:   Schizophreniform psychosis (HCC) Active Problems:   Tobacco use disorder   Cannabis use disorder   Homelessness  Safety and Monitoring: Voluntary admission to  inpatient psychiatric unit for safety, stabilization and treatment    2. Psychiatric Diagnoses and Treatment:  Schizophreniform vs Substance induced psychosis  Cannabis use d/o  Housing instability  Malingering Presented with psychosis x3-38mo, unclear etiology. Broad  ddx, including substance induced, acute stress given his years of psychiatric stability (chart shows regular visit to PCP for ADHD and only few recent ED visits over the past year). He was recently discharged from [facility] (6/11-6/19) and returned for paranoia in the setting of THC use. Had AH 6/26 and passive SI 6/26. No side effects thus far. Despite patient's euthymic mood and improvement in energy, still endorsing paranoia of AH being a "liability" due to thought broadcasting and insertion. Monitor over the weekend to see if symptoms improve.  Encouraged cessation     Continue cymbalta 60 mg po daily  INCREASE zyprexa 15 mg po daily at bedtime to 20 mg daily at bedtime.  Tobacco use d/o  NRTs Encouraged cessation   Stimulant misuse d/o  Reported adherent to adderall for years and reported still taking it, however UDS negative for amphetamines. Possible diversion, maybe he didn't take it 24h prior to UDS. Discontinued home adderall 20mg  + 10mg  daily   3. Medical Issues Being Addressed:  HTN, stable Likely 2/2 cymbalta, since BP was normotensive prior to starting cymbalta. BP well controlled on lisinopril.  Continue lisinopril 5 mg po daily at bedtime.   4. Routine and other pertinent labs:   BMI:29.05 CMP Ca  8.4, ALT 52 CBC WNL PT/INR/PTT WNL CTH no acute processes  Qtc 470 UDS+THC BAL <10  5. Disposition Planning:  Tentative Date: 7/03 Barrier: psychosis, homelessness  Location: shelter  Treatment Plan Summary: I certify that inpatient services furnished can reasonably be expected to improve the patient's condition.   Daily contact with patient to assess and evaluate symptoms and progress in treatment and  Medication management Daily contact with patient to assess and evaluate symptoms and progress in treatment Patient's case to be discussed in multi-disciplinary team meeting Observation Level: q15 minute checks  Vital signs: q12 hours Precautions: suicide, elopement, and assault The risks/benefits/side-effects/alternatives to this medication were discussed in detail with the patient and time was given for questions. The patient consents to medication trial. The patient consents to medication trial. FDA black box warnings, if present, were discussed. Metabolic profile and EKG monitoring obtained while on an atypical antipsychotic  Encouraged patient to participate in unit milieu and in scheduled group therapies   Total Time spent with patient:  Patient's case was discussed with Attending, see attestation for more information.   Signed: Ezekiel Slocumb, MS3 [facility]  Attestation   I was present for the entirety of the evaluation on 03/02/2023. I reviewed the patient's chart, and I participated in key portions of the service. I discussed the case with the medical student, and I agree with the assessment and plan of care as documented in the medical student's note. Case was discussed with attending.  Princess Bruins, DO Psych Resident

## 2023-03-02 NOTE — Progress Notes (Signed)
D: Pt denied SI/HI/AVH this morning. Pt rated his depression a 2/10, anxiety a 2/10, and feelings of hopelessness a 0/10. Pt has been pleasant, calm, and cooperative throughout the shift.   A: RN provided support and encouragement to patient. Pt given scheduled medications as prescribed. Q15 min checks verified for safety.    R: Patient verbally contracts for safety. Patient compliant with medications and treatment plan. Patient is interacting well on the unit. Pt is safe on the unit.   03/02/23 0937  Psych Admission Type (Psych Patients Only)  Admission Status Voluntary  Psychosocial Assessment  Patient Complaints Anxiety;Depression (Pt rates anxiety and depression a 2/10)  Eye Contact Fair  Facial Expression Sad  Affect Depressed;Sad  Speech Logical/coherent  Interaction Assertive  Motor Activity Other (Comment) (WDL)  Appearance/Hygiene Unremarkable  Behavior Characteristics Cooperative;Appropriate to situation  Mood Depressed;Sad;Pleasant  Thought Process  Coherency WDL  Content WDL  Delusions None reported or observed  Perception WDL  Hallucination None reported or observed  Judgment Impaired  Confusion None  Danger to Self  Current suicidal ideation? Denies  Description of Suicide Plan No plan  Self-Injurious Behavior No self-injurious ideation or behavior indicators observed or expressed   Agreement Not to Harm Self Yes  Description of Agreement Pt verbally contracts for safety  Danger to Others  Danger to Others None reported or observed

## 2023-03-03 ENCOUNTER — Encounter (HOSPITAL_COMMUNITY): Payer: Self-pay

## 2023-03-03 MED ORDER — HYDROXYZINE HCL 50 MG PO TABS
50.0000 mg | ORAL_TABLET | Freq: Three times a day (TID) | ORAL | Status: DC | PRN
  Administered 2023-03-03 – 2023-03-05 (×4): 50 mg via ORAL
  Filled 2023-03-03: qty 20
  Filled 2023-03-03 (×4): qty 1

## 2023-03-03 NOTE — Group Note (Signed)
Recreation Therapy Group Note   Group Topic:Team Building  Group Date: 03/03/2023 Start Time: 0940 End Time: 1012 Facilitators: Damonica Chopra-McCall, LRT,CTRS Location: [address] Dayroom   Goal Area(s) Addresses:  Patient will effectively work with peer towards shared goal.  Patient will identify skills used to make activity successful.  Patient will identify how skills used during activity can be applied to reach post d/c goals.    Group Description: [employer]. In teams of 5-6, patients were given 11 craft pipe cleaners. Using the materials provided, patients were instructed to compete again the opposing team(s) to build the tallest free-standing structure from floor level. The activity was timed; difficulty increased by Clinical research associate as Production designer, theatre/television/film continued.  Systematically resources were removed with additional directions for example, placing one arm behind their back, working in silence, and shape stipulations. LRT facilitated post-activity discussion reviewing team processes and necessary communication skills involved in completion. Patients were encouraged to reflect how the skills utilized, or not utilized, in this activity can be incorporated to positively impact support systems post discharge.   Affect/Mood: N/A   Participation Level: Did not attend    Clinical Observations/Individualized Feedback:     Plan: Continue to engage patient in RT group sessions 2-3x/week.   Jeneal Vogl-McCall, LRT,CTRS 03/03/2023 12:04 PM

## 2023-03-03 NOTE — [facility] Group Notes (Signed)
[facility] Group Notes:  (Nursing/MHT/Case Management/Adjunct)  Date:  03/03/2023  Time:  8:28 PM  Type of Therapy:   AA group  Participation Level:  Did Not Attend  Participation Quality:    Affect:    Cognitive:    Insight:    Engagement in Group:    Modes of Intervention:    Summary of Progress/Problems: didn't attend.  Noah Delaine 03/03/2023, 8:28 PM

## 2023-03-03 NOTE — Plan of Care (Signed)
  Problem: Clinical Measurements: Goal: Respiratory complications will improve Outcome: Progressing Goal: Cardiovascular complication will be avoided Outcome: Progressing   Problem: Activity: Goal: Risk for activity intolerance will decrease Outcome: Progressing   Problem: Nutrition: Goal: Adequate nutrition will be maintained Outcome: Progressing   Problem: Coping: Goal: Level of anxiety will decrease Outcome: Progressing   Problem: Elimination: Goal: Will not experience complications related to bowel motility Outcome: Progressing Goal: Will not experience complications related to urinary retention Outcome: Progressing   

## 2023-03-03 NOTE — Plan of Care (Signed)
  Problem: Nutrition: Goal: Adequate nutrition will be maintained Outcome: Progressing   Problem: Safety: Goal: Ability to remain free from injury will improve Outcome: Progressing   Problem: Education: Goal: Knowledge of the prescribed therapeutic regimen will improve Outcome: Progressing   Problem: Coping: Goal: Coping ability will improve Outcome: Progressing Goal: Will verbalize feelings Outcome: Progressing

## 2023-03-03 NOTE — Progress Notes (Signed)
[facility] MD Progress Note  Date: 03/03/2023 9:28 AM Name: William Romero  DOB: [birth year] MRN: [id number] Unit: [id number]  CC: psychosis  REASON FOR ADMISSION   William Romero is a 42 y.o. male with PMH of unspecified schizophreniform vs substance induced psychosis MDD, ADHD, cannabis use d/o, tobacco use d/o, housing instability, who presented voluntary to [facility] (02/24/2023) for weakness then presented to [facility] (02/24/2023), then admitted to [facility] Voluntary for paranoia in the setting of THC use. Prior to this, he was recently admitted to [facility] 02/14/2023-02/22/2023 and discharged to self. On 02/23/2023, he then presented to [facility] and was discharged again.   Principal Problem: Schizophreniform psychosis (HCC) Diagnosis: Principal Problem:   Schizophreniform psychosis (HCC) Active Problems:   Tobacco use disorder   Cannabis use disorder   Homelessness  CHART REVIEW FROM LAST 24 HOURS   Adherent to scheduled meds: yes  Agitation PRNs: none  Per nursing staff: no acute behavioral concerns  Groups: yes, engaged  Documented sleep last 24 hours: 8.5   SUBJECTIVE  Patient was initially seen in bed. During evaluation patient was calm and engaged.  He feels anxious, but states that he is optimistic. Still having auditory hallucinations. Most recently experienced them this morning. Does not recall the contents of the hallucination, but noted that he knows who is talking to him. He states the voices consist of 3 of his previous coworkers, but that the speech pattern and personalities are different from his former coworkers. Stated that he was able to easily ignore the [facility].   He notes decreased worrying that the voices will take the information from his head and give them to someone else and that the voices sometimes puts ideas into his head. Stated that he trusts what the doctor told him, referring to these concerns not being a liability to future employment, and will focus on  that.   Reported that appetite continues to be low, unchanged from yesterday.  Reported sleeping well last night. Denies nightmares after stopping trazodone.  Denied active and passive SI/HI/VH today. Last AH was 6/28 AM. Last passive SI 6/26. Endorses paranoia but when asked to elaborate patient described concerns about "moving forward" and discharge, more appropriately fitting anxiety.   Review of Systems  Constitutional:  Negative for malaise/fatigue.  Respiratory:  Negative for shortness of breath.   Cardiovascular:  Negative for chest pain and palpitations.  Gastrointestinal:  Negative for abdominal pain, constipation, diarrhea, nausea and vomiting.  Neurological:  Negative for dizziness, tremors and headaches.   HISTORY  Past Psychiatric History:  Diagnoses: MDD, ADHD and schizophrenia Inpatient treatment: ~6 between 2012-2019 Suicide: Denied Homicide: Denied Medication history: Unknown Medication compliance: Poor, except to adderall   Past Medical/Surgical History:  Medical Diagnoses:NA Prior Hosp: NA Prior Surgeries/Trauma: NA Concussions/Head Trauma/LOC:Denied Seizures: Denied PCP: Philip Aspen, Limmie Patricia, MD  Allergies: Patient has no known allergies.    Family Psychiatric History:  Denied    Social History:  Housing: unhoused Finances: unemployed Children: NA Guns/Weapons: Denied Legal: jail - reported for self defense   Substance Use History: Alcohol: last drink was ~5 d prior to admission, 2-3 beers. Prior to that ~3 weeks prior was 1 pint of whiskey  Nicotine:  reports that he has quit smoking. His smoking use included cigarettes. He smoked an average of .5 packs per day. He has never used smokeless tobacco. Marijuana: Yes IV drug use: Denied Stimulants: Possible stimulant misuse  Opiates: Denied Sedative/hypnotics: Denied Hallucinogens: Denied H/O withdrawals,  blackouts: Denied H/O DT: Denied H/O Detox / Rehab: Denied DUI/DWI: Denied  Past  Medical History:  Past Medical History:  Diagnosis Date   Schizophrenia (HCC)     Past Surgical History:  Procedure Laterality Date   APPENDECTOMY     Family History:  History reviewed. No pertinent family history. Social History:  Social History   Substance and Sexual Activity  Alcohol Use Not Currently   Alcohol/week: 5.0 standard drinks of alcohol   Types: 5 Cans of beer per week     Social History   Substance and Sexual Activity  Drug Use No    Social History   Socioeconomic History   Marital status: Single    Spouse name: Not on file   Number of children: Not on file   Years of education: Not on file   Highest education level: Not on file  Occupational History   Not on file  Tobacco Use   Smoking status: Former    Packs/day: .5    Types: Cigarettes   Smokeless tobacco: Never  Vaping Use   Vaping Use: Never used  Substance and Sexual Activity   Alcohol use: Not Currently    Alcohol/week: 5.0 standard drinks of alcohol    Types: 5 Cans of beer per week   Drug use: No   Sexual activity: Not on file  Other Topics Concern   Not on file  Social History Narrative   Not on file   Social Determinants of Health   Financial Resource Strain: Not on file  Food Insecurity: No Food Insecurity (02/25/2023)   Hunger Vital Sign    Worried About Running Out of Food in the Last Year: Never true    Ran Out of Food in the Last Year: Never true  Recent Concern: Food Insecurity - Food Insecurity Present (02/24/2023)   Hunger Vital Sign    Worried About Running Out of Food in the Last Year: Often true    Ran Out of Food in the Last Year: Sometimes true  Transportation Needs: No Transportation Needs (02/25/2023)   PRAPARE - Administrator, Civil Service (Medical): No    Lack of Transportation (Non-Medical): No  Physical Activity: Not on file  Stress: Not on file  Social Connections: Not on file   Additional Social History:                          Current Medications: Current Facility-Administered Medications  Medication Dose Route Frequency Provider Last Rate Last Admin   acetaminophen (TYLENOL) tablet 650 mg  650 mg Oral Q6H PRN Ardis Hughs, NP   650 mg at 03/01/23 2108   alum & mag hydroxide-simeth (MAALOX/MYLANTA) 200-200-20 MG/5ML suspension 30 mL  30 mL Oral Q4H PRN Ardis Hughs, NP       diphenhydrAMINE (BENADRYL) capsule 50 mg  50 mg Oral TID PRN Ardis Hughs, NP       Or   diphenhydrAMINE (BENADRYL) injection 50 mg  50 mg Intramuscular TID PRN Ardis Hughs, NP       DULoxetine (CYMBALTA) DR capsule 60 mg  60 mg Oral Daily Princess Bruins, DO   60 mg at 03/03/23 0806   haloperidol (HALDOL) tablet 5 mg  5 mg Oral TID PRN Ardis Hughs, NP       Or   haloperidol lactate (HALDOL) injection 5 mg  5 mg Intramuscular TID PRN Ardis Hughs, NP  hydrOXYzine (ATARAX) tablet 25 mg  25 mg Oral TID PRN Ardis Hughs, NP   25 mg at 03/02/23 1324   lisinopril (ZESTRIL) tablet 5 mg  5 mg Oral QHS Princess Bruins, DO   5 mg at 03/02/23 2126   LORazepam (ATIVAN) tablet 2 mg  2 mg Oral TID PRN Ardis Hughs, NP       Or   LORazepam (ATIVAN) injection 2 mg  2 mg Intramuscular TID PRN Ardis Hughs, NP       magnesium hydroxide (MILK OF MAGNESIA) suspension 30 mL  30 mL Oral Daily PRN Ardis Hughs, NP       OLANZapine (ZYPREXA) tablet 15 mg  15 mg Oral QHS Princess Bruins, DO   15 mg at 03/02/23 2126    OBJECTIVE  BP 114/78 (BP Location: Right Arm)   Pulse (!) 115   Temp 97.8 F (36.6 C) (Oral)   Resp 18   Ht 6' (1.829 m)   Wt 97.2 kg   SpO2 100%   BMI 29.05 kg/m   Physical Exam Physical Exam Vitals and nursing note reviewed.  Constitutional:      General: He is not in acute distress.    Appearance: He is not ill-appearing, toxic-appearing or diaphoretic.  HENT:     Head: Normocephalic.  Pulmonary:     Effort: Pulmonary effort is normal. No respiratory distress.   Neurological:     Mental Status: He is alert.     AIMS:   ,  ,  ,  ,      Psychiatric Specialty Exam: Presentation General Appearance: Appropriate for Environment; Casual; Fairly Groomed  Eye Contact: Fair  Speech: Clear and Coherent; Normal Rate  Volume: Normal  Handedness:Right  Mood and Affect  Mood: Anxious  Affect: Appropriate; Congruent; Full Range  Thought Process  Thought Process: Coherent; Goal Directed; Linear  Descriptions of Associations: Intact  Thought Content Suicidal Thoughts:No With Intent; Without Plan Without Plan  Homicidal Thoughts:No Without Plan  Hallucinations:Auditory Voices of 3 former coworkers, but with different speech patterns and personalities.  Ideas of Reference:None  Thought Content:Rumination  Sensorium  Memory:Immediate Fair; Recent Fair  Judgment:Fair  Insight:Shallow   Executive Functions  Orientation:Full (Time, Place and Person)  Language:Good  Concentration:Good  Attention:Good  Recall:Good  Fund of Knowledge:Good  Psychomotor Activity  Psychomotor Activity:Normal  Assets  Assets:Communication Skills; Desire for Improvement  Sleep  Quality:Good  Documented sleep last 24 hours: 8.5   Lab Results:  No results found for this or any previous visit (from the past 48 hour(s)).  Blood Alcohol level:  Lab Results  Component Value Date   ETH <10 02/24/2023   ETH <10 02/23/2023   Metabolic Disorder Labs: Lab Results  Component Value Date   HGBA1C 5.0 02/13/2023   MPG 96.8 02/13/2023   No results found for: "PROLACTIN" Lab Results  Component Value Date   CHOL 150 02/13/2023   TRIG 100 02/13/2023   HDL 53 02/13/2023   CHOLHDL 2.8 02/13/2023   VLDL 20 02/13/2023   LDLCALC 77 02/13/2023   ASSESSMENT / PLAN  Diagnoses / Active Problems: Principal Problem:   Schizophreniform psychosis (HCC) Active Problems:   Tobacco use disorder   Cannabis use disorder   Homelessness  Safety and  Monitoring: Voluntary admission to inpatient psychiatric unit for safety, stabilization and treatment    2. Psychiatric Diagnoses and Treatment:  Schizophreniform vs Substance induced psychosis  Cannabis use d/o  Housing instability  Malingering Presented with  psychosis x3-14mo, unclear etiology. Broad ddx, including substance induced, acute stress given his years of psychiatric stability (chart shows regular visit to PCP for ADHD and only few recent ED visits over the past year). He was recently discharged from [facility] (6/11-6/19) and returned for paranoia in the setting of THC use. Patient's concerns related to the voices being a "liability" due to thought broadcasting and insertion improving. Most recent AH 6/28 and passive SI 6/26. Increased anxiety about hospital stay during the day.   Encouraged cessation     Continue cymbalta 60 mg po daily  Continue zyprexa 20 mg po daily at bedtime. INCREASE atarax 25 mg po TID PRN to 50 mg po TID PRN   Tobacco use d/o  NRTs Encouraged cessation   Stimulant misuse d/o  Reported adherent to adderall for years and reported still taking it, however UDS negative for amphetamines. Possible diversion, maybe he didn't take it 24h prior to UDS. Discontinued home adderall 20mg  + 10mg  daily   3. Medical Issues Being Addressed:  HTN, stable Likely 2/2 cymbalta, since BP was normotensive prior to starting cymbalta. BP well controlled on lisinopril.  Continue lisinopril 5 mg po daily at bedtime.   4. Routine and other pertinent labs:   BMI:29.05 CMP Ca  8.4, ALT 52 CBC WNL PT/INR/PTT WNL CTH no acute processes  Qtc 470 UDS+THC BAL <10  5. Disposition Planning:  Tentative Date: 7/01 Barrier: psychosis, homelessness  Location: shelter  Treatment Plan Summary: I certify that inpatient services furnished can reasonably be expected to improve the patient's condition.   Daily contact with patient to assess and evaluate symptoms and progress in  treatment and Medication management Daily contact with patient to assess and evaluate symptoms and progress in treatment Patient's case to be discussed in multi-disciplinary team meeting Observation Level: q15 minute checks  Vital signs: q12 hours Precautions: suicide, elopement, and assault The risks/benefits/side-effects/alternatives to this medication were discussed in detail with the patient and time was given for questions. The patient consents to medication trial. The patient consents to medication trial. FDA black box warnings, if present, were discussed. Metabolic profile and EKG monitoring obtained while on an atypical antipsychotic  Encouraged patient to participate in unit milieu and in scheduled group therapies   Total Time spent with patient:  Patient's case was discussed with Attending, see attestation for more information.   Signed: Ezekiel Slocumb, MS3 [facility]

## 2023-03-03 NOTE — Progress Notes (Signed)
   03/03/23 2110  Psych Admission Type (Psych Patients Only)  Admission Status Voluntary  Psychosocial Assessment  Patient Complaints Anxiety  Eye Contact Fair  Facial Expression Flat  Affect Anxious;Preoccupied  Speech Logical/coherent  Interaction Assertive  Motor Activity Other (Comment) (WDL)  Appearance/Hygiene Unremarkable  Behavior Characteristics Cooperative;Appropriate to situation  Mood Anxious  Thought Process  Coherency WDL  Content WDL  Delusions None reported or observed  Perception Hallucinations  Hallucination Auditory  Judgment Impaired  Confusion None  Danger to Self  Current suicidal ideation? Denies  Self-Injurious Behavior No self-injurious ideation or behavior indicators observed or expressed   Agreement Not to Harm Self Yes  Description of Agreement verbal  Danger to Others  Danger to Others None reported or observed

## 2023-03-03 NOTE — BH IP Treatment Plan (Unsigned)
Interdisciplinary Treatment and Diagnostic Plan Update  03/03/2023 Time of Session: [name] [place] MRN: [id number]  Principal Diagnosis: Schizophreniform psychosis (HCC)  Secondary Diagnoses: Principal Problem:   Schizophreniform psychosis (HCC) Active Problems:   Tobacco use disorder   Cannabis use disorder   Homelessness   Current Medications:  Current Facility-Administered Medications  Medication Dose Route Frequency Provider Last Rate Last Admin   acetaminophen (TYLENOL) tablet 650 mg  650 mg Oral Q6H PRN Ardis Hughs, NP   650 mg at 03/01/23 2108   alum & mag hydroxide-simeth (MAALOX/MYLANTA) 200-200-20 MG/5ML suspension 30 mL  30 mL Oral Q4H PRN Ardis Hughs, NP       diphenhydrAMINE (BENADRYL) capsule 50 mg  50 mg Oral TID PRN Ardis Hughs, NP       Or   diphenhydrAMINE (BENADRYL) injection 50 mg  50 mg Intramuscular TID PRN Ardis Hughs, NP       DULoxetine (CYMBALTA) DR capsule 60 mg  60 mg Oral Daily Princess Bruins, DO   60 mg at 03/03/23 4098   haloperidol (HALDOL) tablet 5 mg  5 mg Oral TID PRN Ardis Hughs, NP       Or   haloperidol lactate (HALDOL) injection 5 mg  5 mg Intramuscular TID PRN Ardis Hughs, NP       hydrOXYzine (ATARAX) tablet 25 mg  25 mg Oral TID PRN Ardis Hughs, NP   25 mg at 03/02/23 1324   lisinopril (ZESTRIL) tablet 5 mg  5 mg Oral QHS Princess Bruins, DO   5 mg at 03/02/23 2126   LORazepam (ATIVAN) tablet 2 mg  2 mg Oral TID PRN Ardis Hughs, NP       Or   LORazepam (ATIVAN) injection 2 mg  2 mg Intramuscular TID PRN Ardis Hughs, NP       magnesium hydroxide (MILK OF MAGNESIA) suspension 30 mL  30 mL Oral Daily PRN Ardis Hughs, NP       OLANZapine (ZYPREXA) tablet 15 mg  15 mg Oral QHS Princess Bruins, DO   15 mg at 03/02/23 2126   PTA Medications: Medications Prior to Admission  Medication Sig Dispense Refill Last Dose   gabapentin (NEURONTIN) 300 MG capsule Take 1 capsule (300  mg total) by mouth 3 (three) times daily. 90 capsule 0    hydrOXYzine (ATARAX) 25 MG tablet Take 1 tablet (25 mg total) by mouth 3 (three) times daily as needed for anxiety. 60 tablet 0    OLANZapine (ZYPREXA) 5 MG tablet Take 1 tablet (5 mg total) by mouth 2 (two) times daily.      traZODone (DESYREL) 50 MG tablet Take 1 tablet (50 mg total) by mouth at bedtime as needed for sleep. 30 tablet 0     Patient Stressors:    Patient Strengths:    Treatment Modalities: Medication Management, Group therapy, Case management,  1 to 1 session with clinician, Psychoeducation, Recreational therapy.   Physician Treatment Plan for Primary Diagnosis: Schizophreniform psychosis (HCC) Long Term Goal(s):     Short Term Goals:    Medication Management: Evaluate patient's response, side effects, and tolerance of medication regimen.  Therapeutic Interventions: 1 to 1 sessions, Unit Group sessions and Medication administration.  Evaluation of Outcomes: Progressing  Physician Treatment Plan for Secondary Diagnosis: Principal Problem:   Schizophreniform psychosis (HCC) Active Problems:   Tobacco use disorder   Cannabis use disorder   Homelessness  Long Term Goal(s):  Short Term Goals:       Medication Management: Evaluate patient's response, side effects, and tolerance of medication regimen.  Therapeutic Interventions: 1 to 1 sessions, Unit Group sessions and Medication administration.  Evaluation of Outcomes: Progressing   RN Treatment Plan for Primary Diagnosis: Schizophreniform psychosis (HCC) Long Term Goal(s): Knowledge of disease and therapeutic regimen to maintain health will improve  Short Term Goals: Ability to remain free from injury will improve, Ability to verbalize frustration and anger appropriately will improve, Ability to demonstrate self-control, Ability to participate in decision making will improve, Ability to verbalize feelings will improve, Ability to disclose and  discuss suicidal ideas, Ability to identify and develop effective coping behaviors will improve, and Compliance with prescribed medications will improve  Medication Management: RN will administer medications as ordered by provider, will assess and evaluate patient's response and provide education to patient for prescribed medication. RN will report any adverse and/or side effects to prescribing provider.  Therapeutic Interventions: 1 on 1 counseling sessions, Psychoeducation, Medication administration, Evaluate responses to treatment, Monitor vital signs and CBGs as ordered, Perform/monitor CIWA, COWS, AIMS and Fall Risk screenings as ordered, Perform wound care treatments as ordered.  Evaluation of Outcomes: Progressing   [name] Treatment Plan for Primary Diagnosis: Schizophreniform psychosis (HCC) Long Term Goal(s): Safe transition to appropriate next level of care at discharge, Engage patient in therapeutic group addressing interpersonal concerns.  Short Term Goals: Engage patient in aftercare planning with referrals and resources, Increase social support, Increase ability to appropriately verbalize feelings, Increase emotional regulation, Facilitate acceptance of mental health diagnosis and concerns, Facilitate patient progression through stages of change regarding substance use diagnoses and concerns, Identify triggers associated with mental health/substance abuse issues, and Increase skills for wellness and recovery  Therapeutic Interventions: Assess for all discharge needs, 1 to 1 time with Social worker, Explore available resources and support systems, Assess for adequacy in community support network, Educate family and significant other(s) on suicide prevention, Complete Psychosocial Assessment, Interpersonal group therapy.  Evaluation of Outcomes: Progressing   Progress in Treatment: Attending groups: Yes. Participating in groups: Yes. Taking medication as prescribed: Yes. Toleration  medication: Yes. Family/Significant other contact made: No, will contact:  Pt declined consents Patient understands diagnosis: Yes. Discussing patient identified problems/goals with staff: Yes. Medical problems stabilized or resolved: Yes. Denies suicidal/homicidal ideation: Yes. Issues/concerns per patient self-inventory: Yes. Other: N/A  New problem(s) identified: No, Describe:  None known  New Short Term/Long Term Goal(s): medication stabilization, elimination of SI thoughts, development of comprehensive mental wellness plan.   Patient Goals:  Medication stabilization  Discharge Plan or Barriers: CSW will continue to follow and assess for appropriate referrals and possible discharge planning.   Reason for Continuation of Hospitalization: Delusions  Depression Hallucinations Medication stabilization Suicidal ideation Withdrawal symptoms  Estimated Length of Stay: 3-7 Days  Last 3 [place] Suicide Severity Risk Score: Flowsheet Row [facility] Most recent reading at 02/25/2023  5:31 PM ED from 02/24/2023 in [facility] Most recent reading at 02/24/2023 11:54 PM ED from 02/24/2023 in [facility] at [facility] Most recent reading at 02/24/2023 10:21 AM  C-SSRS RISK CATEGORY Low Risk High Risk No Risk       Last PHQ 2/9 Scores:    09/26/2022    3:43 PM 06/21/2022    8:20 AM 01/03/2022   11:30 AM  Depression screen PHQ 2/9  Decreased Interest 0 0 0  Down, Depressed, Hopeless 0 0 0  PHQ - 2 Score 0 0 0  Altered sleeping 0 0 0  Tired, decreased energy 0 0 0  Change in appetite 0 0 0  Feeling bad or failure about yourself  0 0 0  Trouble concentrating 0 0 0  Moving slowly or fidgety/restless 0 0 0  Suicidal thoughts 0 0 0  PHQ-9 Score 0 0 0  Difficult doing work/chores Not difficult at all Not difficult at all Not difficult at all  detox,  medication management for mood stabilization; elimination of SI thoughts; development of comprehensive mental wellness/sobriety plan      Scribe for Treatment Team: Ane Payment, [name] 03/03/2023 9:11 AM

## 2023-03-03 NOTE — Progress Notes (Signed)
Patient denies SI and HI. Patient endorses AH early this morning. When asked about the nature of the AH patient stated "I don't know, I tried to ignore it. That's how I'm dealing with it- by ignoring it."Patient rates anxiety 3/10 and depression 0/10. Patient remains safe on unit. 15 minute safety checks ongoing.   03/03/23 0900  Psych Admission Type (Psych Patients Only)  Admission Status Voluntary  Psychosocial Assessment  Patient Complaints Anxiety  Eye Contact Fair  Facial Expression Flat  Affect Anxious;Preoccupied  Speech Logical/coherent  Interaction Assertive  Motor Activity Other (Comment) (WDL)  Appearance/Hygiene Unremarkable  Behavior Characteristics Appropriate to situation;Cooperative  Mood Anxious  Thought Process  Coherency WDL  Content WDL  Delusions None reported or observed  Perception Hallucinations  Hallucination Auditory  Judgment Impaired  Confusion None  Danger to Self  Current suicidal ideation? Denies  Self-Injurious Behavior No self-injurious ideation or behavior indicators observed or expressed   Agreement Not to Harm Self Yes  Description of Agreement verbal

## 2023-03-03 NOTE — Progress Notes (Signed)
   03/02/23 2055  Psych Admission Type (Psych Patients Only)  Admission Status Voluntary  Psychosocial Assessment  Patient Complaints Anxiety;Insomnia  Eye Contact Fair  Facial Expression Sad  Affect Depressed  Speech Logical/coherent  Interaction Assertive  Motor Activity Other (Comment)  Appearance/Hygiene Unremarkable  Behavior Characteristics Appropriate to situation;Cooperative  Mood Depressed  Thought Process  Coherency WDL  Content WDL  Delusions None reported or observed  Perception WDL  Hallucination None reported or observed  Judgment Impaired  Confusion None  Danger to Self  Current suicidal ideation? Denies  Self-Injurious Behavior No self-injurious ideation or behavior indicators observed or expressed   Agreement Not to Harm Self Yes  Description of Agreement verbally contracts for safety  Danger to Others  Danger to Others None reported or observed

## 2023-03-04 DIAGNOSIS — F2081 Schizophreniform disorder: Secondary | ICD-10-CM | POA: Diagnosis not present

## 2023-03-04 MED ORDER — HYDROXYZINE HCL 50 MG PO TABS
50.0000 mg | ORAL_TABLET | Freq: Once | ORAL | Status: AC
  Administered 2023-03-04: 50 mg via ORAL
  Filled 2023-03-04 (×2): qty 1

## 2023-03-04 MED ORDER — HYDROXYZINE HCL 25 MG PO TABS
25.0000 mg | ORAL_TABLET | Freq: Three times a day (TID) | ORAL | Status: DC | PRN
  Administered 2023-03-05: 25 mg via ORAL
  Filled 2023-03-04: qty 1

## 2023-03-04 NOTE — Group Note (Signed)
Date:  03/04/2023 Time:  6:49 PM  Group Topic/Focus:  Dimensions of Wellness:   The focus of this group is to introduce the topic of wellness and discuss the role each dimension of wellness plays in total health.    Participation Level:  Did Not Attend  Participation Quality:   n/a  Affect:   n/a  Cognitive:   n/a  Insight: None  Engagement in Group:   n/a  Modes of Intervention:   n/a  Additional Comments:   Pt did not attend.  Shelsea Hangartner M Christophor Eick 03/04/2023, 6:49 PM  

## 2023-03-04 NOTE — Group Note (Signed)
Date:  03/04/2023 Time:  6:24 PM  Group Topic/Focus:  Goals Group:   The focus of this group is to help patients establish daily goals to achieve during treatment and discuss how the patient can incorporate goal setting into their daily lives to aide in recovery. Orientation:   The focus of this group is to educate the patient on the purpose and policies of crisis stabilization and provide a format to answer questions about their admission.  The group details unit policies and expectations of patients while admitted.    Participation Level:  Did Not Attend  Participation Quality:   n/a  Affect:   n/a  Cognitive:   n/a  Insight: None  Engagement in Group:  n/a   Modes of Intervention:   n/a  Additional Comments:   Pt did not attend.  Edmund Hilda Tijana Walder 03/04/2023, 6:24 PM

## 2023-03-04 NOTE — Progress Notes (Signed)
Patient presents moderately anxious. Administered PRN Hydroxyzine per [facility] per patient request.

## 2023-03-04 NOTE — Progress Notes (Signed)
Pt is A&OX4, calm, guarded, isolative at times, denies suicidal ideations, denies homicidal ideations, denies auditory hallucinations and denies visual hallucinations. Pt verbally agrees to approach staff if these become apparent and before harming self or others. Pt denies experiencing nightmares. Mood and affect are congruent. Pt appetite is ok. No complaints of anxiety, distress, pain and/or discomfort at this time. Pt's memory appears to be grossly intact, and Pt hasn't displayed any injurious behaviors. Pt is medication compliant. There's no evidence of suicidal intent. Psychomotor activity was WNL. No s/s of Parkinson, Dystonia, Akathisia and/or Tardive Dyskinesia noted.

## 2023-03-04 NOTE — Progress Notes (Signed)
[facility] MD Progress Note  Date: 03/04/2023 9:48 AM Name: William Romero  DOB: 03/30/1981 MRN: [id number] Unit: [id number]  CC: psychosis  REASON FOR ADMISSION   William Romero is a 42 y.o. male with PMH of unspecified schizophreniform vs substance induced psychosis MDD, ADHD, cannabis use d/o, tobacco use d/o, housing instability, who presented voluntary to [facility] (02/24/2023) for weakness then presented to [facility] (02/24/2023), then admitted to [facility] Voluntary for paranoia in the setting of THC use. Prior to this, he was recently admitted to [facility] 02/14/2023-02/22/2023 and discharged to self. On 02/23/2023, he then presented to [facility] and was discharged again.   Principal Problem: Schizophreniform psychosis (HCC) Diagnosis: Principal Problem:   Schizophreniform psychosis (HCC) Active Problems:   Tobacco use disorder   Cannabis use disorder   Homelessness  CHART REVIEW FROM LAST 24 HOURS   Adherent to scheduled meds: yes  Agitation PRNs: none  As needed Atarax used twice 6/28 and once earlier today 6/29 8:30 AM Per nursing staff: no acute behavioral concerns  Groups: yes, engaged  Documented sleep last 24 hours: 8   SUBJECTIVE  Upon evaluation today, patient was assessed on the unit, presents pleasant and calm, he reports had a good day yesterday "I feel I am making progress I can ignore the voices" he reports also voices are decreasing in frequency and malodorous which is an improvement, he denies command hallucinations to harm self or others.  He denies visual hallucinations.  He denies paranoia and when asked specifically he denies any delusions regard thought insertion or thought process casting.  He is compliant with medications and denies side effects with them, he reports fair sleep and appetite.  He denies any symptoms consistent with mania or hypomania, he reports improved mood and feeling much less depressed and less anxious, reports titrating dose of Atarax  yesterday is helpful for anxiety.  He is attending some groups with limited interaction and was encouraged for more participation.  Discussed with patient plan to discharge Monday morning to homeless shelter if continues to improve, patient agrees. Patient admits to marijuana use prior to admission, he was counseled regarding need to abstain from marijuana and all other illicit drugs after discharge, he agrees.  Review of Systems  Constitutional:  Negative for malaise/fatigue.  Respiratory:  Negative for shortness of breath.   Cardiovascular:  Negative for chest pain and palpitations.  Gastrointestinal:  Negative for abdominal pain, constipation, diarrhea, nausea and vomiting.  Neurological:  Negative for dizziness, tremors and headaches.  All other systems reviewed and are negative.  HISTORY  Past Psychiatric History:  Diagnoses: MDD, ADHD and schizophrenia Inpatient treatment: ~6 between 2012-2019 Suicide: Denied Homicide: Denied Medication history: Unknown Medication compliance: Poor, except to adderall   Past Medical/Surgical History:  Medical Diagnoses:NA Prior Hosp: NA Prior Surgeries/Trauma: NA Concussions/Head Trauma/LOC:Denied Seizures: Denied PCP: Philip Aspen, Limmie Patricia, MD  Allergies: Patient has no known allergies.    Family Psychiatric History:  Denied    Social History:  Housing: unhoused Finances: unemployed Children: NA Guns/Weapons: Denied Legal: jail - reported for self defense   Substance Use History: Alcohol: last drink was ~5 d prior to admission, 2-3 beers. Prior to that ~3 weeks prior was 1 pint of whiskey  Nicotine:  reports that he has quit smoking. His smoking use included cigarettes. He smoked an average of .5 packs per day. He has never used smokeless tobacco. Marijuana: Yes IV drug use: Denied Stimulants: Possible stimulant misuse  Opiates: Denied  Sedative/hypnotics: Denied Hallucinogens: Denied H/O withdrawals, blackouts: Denied H/O  DT: Denied H/O Detox / Rehab: Denied DUI/DWI: Denied  Past Medical History:  Past Medical History:  Diagnosis Date   Schizophrenia (HCC)     Past Surgical History:  Procedure Laterality Date   APPENDECTOMY     Family History:  History reviewed. No pertinent family history. Social History:  Social History   Substance and Sexual Activity  Alcohol Use Not Currently   Alcohol/week: 5.0 standard drinks of alcohol   Types: 5 Cans of beer per week     Social History   Substance and Sexual Activity  Drug Use No    Social History   Socioeconomic History   Marital status: Single    Spouse name: Not on file   Number of children: Not on file   Years of education: Not on file   Highest education level: Not on file  Occupational History   Not on file  Tobacco Use   Smoking status: Former    Packs/day: .5    Types: Cigarettes   Smokeless tobacco: Never  Vaping Use   Vaping Use: Never used  Substance and Sexual Activity   Alcohol use: Not Currently    Alcohol/week: 5.0 standard drinks of alcohol    Types: 5 Cans of beer per week   Drug use: No   Sexual activity: Not on file  Other Topics Concern   Not on file  Social History Narrative   Not on file   Social Determinants of Health   Financial Resource Strain: Not on file  Food Insecurity: No Food Insecurity (02/25/2023)   Hunger Vital Sign    Worried About Running Out of Food in the Last Year: Never true    Ran Out of Food in the Last Year: Never true  Recent Concern: Food Insecurity - Food Insecurity Present (02/24/2023)   Hunger Vital Sign    Worried About Running Out of Food in the Last Year: Often true    Ran Out of Food in the Last Year: Sometimes true  Transportation Needs: No Transportation Needs (02/25/2023)   PRAPARE - Administrator, Civil Service (Medical): No    Lack of Transportation (Non-Medical): No  Physical Activity: Not on file  Stress: Not on file  Social Connections: Not on file    Additional Social History:                         Current Medications: Current Facility-Administered Medications  Medication Dose Route Frequency Provider Last Rate Last Admin   acetaminophen (TYLENOL) tablet 650 mg  650 mg Oral Q6H PRN Ardis Hughs, NP   650 mg at 03/01/23 2108   alum & mag hydroxide-simeth (MAALOX/MYLANTA) 200-200-20 MG/5ML suspension 30 mL  30 mL Oral Q4H PRN Ardis Hughs, NP       diphenhydrAMINE (BENADRYL) capsule 50 mg  50 mg Oral TID PRN Ardis Hughs, NP       Or   diphenhydrAMINE (BENADRYL) injection 50 mg  50 mg Intramuscular TID PRN Ardis Hughs, NP       DULoxetine (CYMBALTA) DR capsule 60 mg  60 mg Oral Daily Princess Bruins, DO   60 mg at 03/04/23 9604   haloperidol (HALDOL) tablet 5 mg  5 mg Oral TID PRN Ardis Hughs, NP       Or   haloperidol lactate (HALDOL) injection 5 mg  5 mg Intramuscular TID PRN Effie Shy,  Liane Comber, NP       hydrOXYzine (ATARAX) tablet 50 mg  50 mg Oral TID PRN Sarita Bottom, MD   50 mg at 03/04/23 0833   lisinopril (ZESTRIL) tablet 5 mg  5 mg Oral QHS Princess Bruins, DO   5 mg at 03/03/23 2110   LORazepam (ATIVAN) tablet 2 mg  2 mg Oral TID PRN Ardis Hughs, NP       Or   LORazepam (ATIVAN) injection 2 mg  2 mg Intramuscular TID PRN Ardis Hughs, NP       magnesium hydroxide (MILK OF MAGNESIA) suspension 30 mL  30 mL Oral Daily PRN Ardis Hughs, NP       OLANZapine (ZYPREXA) tablet 15 mg  15 mg Oral QHS Princess Bruins, DO   15 mg at 03/03/23 2110    OBJECTIVE  BP 116/69 (BP Location: Left Arm)   Pulse 96   Temp 98.1 F (36.7 C) (Oral)   Resp 18   Ht 6' (1.829 m)   Wt 97.2 kg   SpO2 99%   BMI 29.05 kg/m   Physical Exam Physical Exam Vitals and nursing note reviewed.  Constitutional:      General: He is not in acute distress.    Appearance: He is not ill-appearing, toxic-appearing or diaphoretic.  HENT:     Head: Normocephalic.  Pulmonary:     Effort: Pulmonary  effort is normal. No respiratory distress.  Neurological:     Mental Status: He is alert.     AIMS:Dental Status Current problems with teeth and/or dentures?: No Does patient usually wear dentures?: No Facial and Oral Movements Muscles of Facial Expression: None, normal Lips and Perioral Area: None, normal Jaw: None, normal Tongue: None, normal, Extremity Movements Upper (arms, wrists, hands, fingers): None, normal Lower (legs, knees, ankles, toes): None, normal, Trunk Movements Neck, shoulders, hips: None, normal, Overall Severity Severity of abnormal movements (highest score from questions above): None, normal Incapacitation due to abnormal movements: None, normal Patient's awareness of abnormal movements (rate only patient's report): No Awareness, Dental Status Current problems with teeth and/or dentures?: No Does patient usually wear dentures?: No    Psychiatric Specialty Exam: Presentation General Appearance: Appropriate for Environment; Casual; Fairly Groomed  Eye Contact: Fair  Speech: Clear and Coherent; Normal Rate  Volume: Normal  Handedness:Right  Mood and Affect  Mood: Euthymic, slightly depressed and slightly anxious, improved anxiety since yesterday Affect: Restricted but seems to be at baseline Thought Process  Thought Process: Coherent; Goal Directed; Linear  Descriptions of Associations: Intact yet concrete Thought Content Suicidal Thoughts:No With Intent; Without Plan Without Plan  Homicidal Thoughts:No Without Plan  Hallucinations: Reports improved completely hallucinations decrease the frequency and labs and denies any commanding hallucinations, denies VH, does not appear responding to stimuli during evaluation Ideas of Reference:None  Thought Content:Rumination No paranoia or other delusions reported Sensorium  Memory:Immediate Fair; Recent Fair  Judgment:Fair  Insight: Improved  Art therapist  Orientation:Full (Time, Place and  Person)  Language:Good  Concentration:Good  Attention:Good  Recall:Good  Fund of Knowledge:Good  Psychomotor Activity  Psychomotor Activity:Normal  Assets  Assets:Communication Skills; Desire for Improvement  Sleep  Quality:Good  Documented sleep last 24 hours: 8   Lab Results:  No results found for this or any previous visit (from the past 48 hour(s)).  Blood Alcohol level:  Lab Results  Component Value Date   [facility] <10 02/24/2023   ETH <10 02/23/2023   Metabolic Disorder Labs: Lab Results  Component Value Date   HGBA1C 5.0 02/13/2023   MPG 96.8 02/13/2023   No results found for: "PROLACTIN" Lab Results  Component Value Date   CHOL 150 02/13/2023   TRIG 100 02/13/2023   HDL 53 02/13/2023   CHOLHDL 2.8 02/13/2023   VLDL 20 02/13/2023   LDLCALC 77 02/13/2023   ASSESSMENT / PLAN  Diagnoses / Active Problems: Principal Problem:   Schizophreniform psychosis (HCC) Active Problems:   Tobacco use disorder   Cannabis use disorder   Homelessness  Safety and Monitoring: Voluntary admission to inpatient psychiatric unit for safety, stabilization and treatment    2. Psychiatric Diagnoses and Treatment:  Schizophreniform vs Substance induced psychosis  Cannabis use d/o  Housing instability  Malingering Presented with psychosis x3-73mo, unclear etiology. Broad ddx, including substance induced, acute stress given his years of psychiatric stability (chart shows regular visit to PCP for ADHD and only few recent ED visits over the past year). He was recently discharged from [facility] (6/11-6/19) and returned for paranoia in the setting of THC use. Patient's concerns related to the voices being a "liability" due to thought broadcasting and insertion improving. Most recent AH 6/28 and passive SI 6/26. Increased anxiety about hospital stay during the day.   Encouraged cessation     Continue cymbalta 60 mg po daily  Continue zyprexa 20 mg po daily at bedtime. Continue Atarax  50 mg every 8 hours as needed for anxiety, helpful with no side effects  Tobacco use d/o  NRTs Encouraged cessation   Stimulant misuse d/o  Reported adherent to adderall for years and reported still taking it, however UDS negative for amphetamines. Possible diversion, maybe he didn't take it 24h prior to UDS. Discontinued home adderall 20mg  + 10mg  daily   3. Medical Issues Being Addressed:  HTN, stable Likely 2/2 cymbalta, since BP was normotensive prior to starting cymbalta. BP well controlled on lisinopril.  Continue lisinopril 5 mg po daily at bedtime.   4. Routine and other pertinent labs:   BMI:29.05 CMP Ca  8.4, ALT 52 CBC WNL PT/INR/PTT WNL CTH no acute processes  Qtc 470 UDS+THC BAL <10  5. Disposition Planning:  Tentative Date: 7/01 Barrier: psychosis, homelessness  Location: shelter  Treatment Plan Summary: I certify that inpatient services furnished can reasonably be expected to improve the patient's condition.   Daily contact with patient to assess and evaluate symptoms and progress in treatment and Medication management Daily contact with patient to assess and evaluate symptoms and progress in treatment Patient's case to be discussed in multi-disciplinary team meeting Observation Level: q15 minute checks  Vital signs: q12 hours Precautions: suicide, elopement, and assault The risks/benefits/side-effects/alternatives to this medication were discussed in detail with the patient and time was given for questions. The patient consents to medication trial. The patient consents to medication trial. FDA black box warnings, if present, were discussed. Metabolic profile and EKG monitoring obtained while on an atypical antipsychotic  Encouraged patient to participate in unit milieu and in scheduled group therapies   Total Time Spent in Direct Patient Care:  I personally spent 35 minutes on the unit in direct patient care. The direct patient care time included  face-to-face time with the patient, reviewing the patient's chart, communicating with other professionals, and coordinating care. Greater than 50% of this time was spent in counseling or coordinating care with the patient regarding goals of hospitalization, psycho-education, and discharge planning needs.    Sarita Bottom, MD Psychiatrist  03/04/2023 9:45

## 2023-03-05 DIAGNOSIS — F2081 Schizophreniform disorder: Secondary | ICD-10-CM | POA: Diagnosis not present

## 2023-03-05 DIAGNOSIS — F17200 Nicotine dependence, unspecified, uncomplicated: Secondary | ICD-10-CM

## 2023-03-05 DIAGNOSIS — F1290 Cannabis use, unspecified, uncomplicated: Secondary | ICD-10-CM

## 2023-03-05 DIAGNOSIS — Z5900 Homelessness unspecified: Secondary | ICD-10-CM

## 2023-03-05 MED ORDER — OLANZAPINE 10 MG PO TABS
20.0000 mg | ORAL_TABLET | Freq: Every day | ORAL | Status: DC
  Administered 2023-03-05: 20 mg via ORAL
  Filled 2023-03-05: qty 14
  Filled 2023-03-05 (×2): qty 2

## 2023-03-05 NOTE — Progress Notes (Signed)
   03/05/23 0800  Psych Admission Type (Psych Patients Only)  Admission Status Voluntary  Psychosocial Assessment  Patient Complaints None  Eye Contact Fair  Facial Expression Flat  Affect Appropriate to circumstance  Speech Logical/coherent  Interaction Assertive  Motor Activity Other (Comment) (WDL)  Appearance/Hygiene Unremarkable  Behavior Characteristics Cooperative;Appropriate to situation;Calm  Mood Pleasant  Aggressive Behavior  Targets Other (Comment) (None)  Thought Process  Coherency WDL  Content WDL  Delusions None reported or observed  Perception WDL  Hallucination None reported or observed  Judgment Impaired  Confusion None  Danger to Self  Current suicidal ideation? Denies  Self-Injurious Behavior No self-injurious ideation or behavior indicators observed or expressed   Agreement Not to Harm Self Yes  Description of Agreement Verbal  Danger to Others  Danger to Others None reported or observed

## 2023-03-05 NOTE — [facility] Group Notes (Signed)
[facility] Group Notes:  (Nursing)  Date:  03/05/2023  Time:  1400  Type of Therapy:  Nurse Education  Participation Level:  Did Not Attend  Summary of Progress/Problems:  Healthy relationships What makes a healthy relationship? Can you recognize an unhealthy relationship?   Shela Nevin 03/05/2023, 3:44 PM

## 2023-03-05 NOTE — Progress Notes (Signed)
   03/05/23 0559  15 Minute Checks  Location Bedroom  Visual Appearance Calm  Behavior Composed  Sleep (Behavioral Health Patients Only)  Calculate sleep? (Click Yes once per 24 hr at 0600 safety check) Yes  Documented sleep last 24 hours 8.5

## 2023-03-05 NOTE — [facility] Group Notes (Signed)
[facility] Group Notes:  (Nursing/MHT/Case Management/Adjunct)  Date:  03/05/2023  Time:  12:35 AM  Type of Therapy:   Wrap-up group  Participation Level:  Did Not Attend  Participation Quality:    Affect:    Cognitive:    Insight:    Engagement in Group:    Modes of Intervention:    Summary of Progress/Problems: Pt didn't attend.  Noah Delaine 03/05/2023, 12:35 AM

## 2023-03-05 NOTE — Plan of Care (Signed)

## 2023-03-05 NOTE — [facility] Group Notes (Signed)
Pt did not attend group discussion 

## 2023-03-05 NOTE — Progress Notes (Signed)
[name] MD Progress Note  Date: 03/05/2023 10:44 AM Name: William Romero  DOB: 1981/03/31 MRN: [id number] Unit: [id number]  CC: psychosis  REASON FOR ADMISSION   William Romero with PMH of unspecified schizophreniform vs substance induced psychosis MDD, ADHD, cannabis use d/o, tobacco use d/o, housing instability, who presented voluntary to [facility] (02/24/2023) for weakness then presented to [facility] (02/24/2023), then admitted to [facility] Voluntary for paranoia in the setting of THC use. Prior to this, he was recently admitted to [facility] 02/14/2023-02/22/2023 and discharged to self. On 02/23/2023, he then presented to [facility] and was discharged again.   Principal Problem: Schizophreniform psychosis (HCC) Diagnosis: Principal Problem:   Schizophreniform psychosis (HCC) Active Problems:   Tobacco use disorder   Cannabis use disorder   Homelessness  CHART REVIEW FROM LAST 24 HOURS   Adherent to scheduled meds: yes  Agitation PRNs: none  As needed Atarax used average twice to 3 times daily, last use 6/29 8 AM Per nursing staff: no acute behavioral concerns  Groups: yes, engaged  Documented sleep last 24 hours: 8.5   SUBJECTIVE  Upon evaluation today, patient was assessed on the unit, presents pleasant and calm, he reports anxiety yesterday used Vistaril as needed and it helped reports also some depressed mood unable to specify any triggers he denies any passive or active SI intention or plan denies feeling hopeless or helpless or wishing self dead.  Denies HI.  He denies any AVH this morning reports last time he heard any voices was yesterday but continues to report that it is much improved since admission mumbling not loud and he is able to distract himself and ignores it, he denies any paranoia or delusions he reports will contact some [place] houses today and plans to continue to contact them after discharge to [facility] shelter.  He also presents goal oriented and future  oriented planning to apply for jobs after discharge.  He tells me he is looking forward to his discharge tomorrow.  He denies any symptoms consistent with mania or hypomania.  He is compliant with medication and denies side effects with them and does not present with any sign consistent with TD or EPS.  Review of Systems  Constitutional:  Negative for malaise/fatigue.  Respiratory:  Negative for shortness of breath.   Cardiovascular:  Negative for chest pain and palpitations.  Gastrointestinal:  Negative for abdominal pain, constipation, diarrhea, nausea and vomiting.  Neurological:  Negative for dizziness, tremors and headaches.  All other systems reviewed and are negative.  HISTORY  Past Psychiatric History:  Diagnoses: MDD, ADHD and schizophrenia Inpatient treatment: ~6 between 2012-2019 Suicide: Denied Homicide: Denied Medication history: Unknown Medication compliance: Poor, except to adderall   Past Medical/Surgical History:  Medical Diagnoses:NA Prior Hosp: NA Prior Surgeries/Trauma: NA Concussions/Head Trauma/LOC:Denied Seizures: Denied PCP: Philip Aspen, Limmie Patricia, MD  Allergies: Patient has no known allergies.    Family Psychiatric History:  Denied    Social History:  Housing: unhoused Finances: unemployed Children: NA Guns/Weapons: Denied Legal: jail - reported for self defense   Substance Use History: Alcohol: last drink was ~5 d prior to admission, 2-3 beers. Prior to that ~3 weeks prior was 1 pint of whiskey  Nicotine:  reports that he has quit smoking. His smoking use included cigarettes. He smoked an average of .5 packs per day. He has never used smokeless tobacco. Marijuana: Yes IV drug use: Denied Stimulants: Possible stimulant misuse  Opiates: Denied Sedative/hypnotics:  Denied Hallucinogens: Denied H/O withdrawals, blackouts: Denied H/O DT: Denied H/O Detox / Rehab: Denied DUI/DWI: Denied  Past Medical History:  Past Medical History:   Diagnosis Date   Schizophrenia (HCC)     Past Surgical History:  Procedure Laterality Date   APPENDECTOMY     Family History:  History reviewed. No pertinent family history. Social History:  Social History   Substance and Sexual Activity  Alcohol Use Not Currently   Alcohol/week: 5.0 standard drinks of alcohol   Types: 5 Cans of beer per week     Social History   Substance and Sexual Activity  Drug Use No    Social History   Socioeconomic History   Marital status: Single    Spouse name: Not on file   Number of children: Not on file   Years of education: Not on file   Highest education level: Not on file  Occupational History   Not on file  Tobacco Use   Smoking status: Former    Packs/day: .5    Types: Cigarettes   Smokeless tobacco: Never  Vaping Use   Vaping Use: Never used  Substance and Sexual Activity   Alcohol use: Not Currently    Alcohol/week: 5.0 standard drinks of alcohol    Types: 5 Cans of beer per week   Drug use: No   Sexual activity: Not on file  Other Topics Concern   Not on file  Social History Narrative   Not on file   Social Determinants of Health   Financial Resource Strain: Not on file  Food Insecurity: No Food Insecurity (02/25/2023)   Hunger Vital Sign    Worried About Running Out of Food in the Last Year: Never true    Ran Out of Food in the Last Year: Never true  Recent Concern: Food Insecurity - Food Insecurity Present (02/24/2023)   Hunger Vital Sign    Worried About Running Out of Food in the Last Year: Often true    Ran Out of Food in the Last Year: Sometimes true  Transportation Needs: No Transportation Needs (02/25/2023)   PRAPARE - Administrator, Civil Service (Medical): No    Lack of Transportation (Non-Medical): No  Physical Activity: Not on file  Stress: Not on file  Social Connections: Not on file   Additional Social History:                         Current Medications: Current  Facility-Administered Medications  Medication Dose Route Frequency Provider Last Rate Last Admin   acetaminophen (TYLENOL) tablet 650 mg  650 mg Oral Q6H PRN Ardis Hughs, NP   650 mg at 03/05/23 0606   alum & mag hydroxide-simeth (MAALOX/MYLANTA) 200-200-20 MG/5ML suspension 30 mL  30 mL Oral Q4H PRN Ardis Hughs, NP       diphenhydrAMINE (BENADRYL) capsule 50 mg  50 mg Oral TID PRN Ardis Hughs, NP       Or   diphenhydrAMINE (BENADRYL) injection 50 mg  50 mg Intramuscular TID PRN Ardis Hughs, NP       DULoxetine (CYMBALTA) DR capsule 60 mg  60 mg Oral Daily Princess Bruins, DO   60 mg at 03/05/23 0813   haloperidol (HALDOL) tablet 5 mg  5 mg Oral TID PRN Ardis Hughs, NP       Or   haloperidol lactate (HALDOL) injection 5 mg  5 mg Intramuscular TID PRN Vernard Gambles  H, NP       hydrOXYzine (ATARAX) tablet 25 mg  25 mg Oral TID PRN Starleen Blue, NP       hydrOXYzine (ATARAX) tablet 50 mg  50 mg Oral TID PRN Sarita Bottom, MD   50 mg at 03/04/23 0833   lisinopril (ZESTRIL) tablet 5 mg  5 mg Oral QHS Princess Bruins, DO   5 mg at 03/04/23 2148   LORazepam (ATIVAN) tablet 2 mg  2 mg Oral TID PRN Ardis Hughs, NP       Or   LORazepam (ATIVAN) injection 2 mg  2 mg Intramuscular TID PRN Ardis Hughs, NP       magnesium hydroxide (MILK OF MAGNESIA) suspension 30 mL  30 mL Oral Daily PRN Ardis Hughs, NP       OLANZapine (ZYPREXA) tablet 20 mg  20 mg Oral QHS Terianne Thaker, MD        OBJECTIVE  BP 117/78 (BP Location: Right Arm)   Pulse (!) 106   Temp 98.2 F (36.8 C) (Oral)   Resp 18   Ht 6' (1.829 m)   Wt 97.2 kg   SpO2 99%   BMI 29.05 kg/m   Physical Exam Physical Exam Vitals and nursing note reviewed.  Constitutional:      General: He is not in acute distress.    Appearance: He is not ill-appearing, toxic-appearing or diaphoretic.  HENT:     Head: Normocephalic.  Pulmonary:     Effort: Pulmonary effort is normal. No respiratory  distress.  Neurological:     Mental Status: He is alert.     AIMS:Dental Status Current problems with teeth and/or dentures?: No Does patient usually wear dentures?: No Facial and Oral Movements Muscles of Facial Expression: None, normal Lips and Perioral Area: None, normal Jaw: None, normal Tongue: None, normal, Extremity Movements Upper (arms, wrists, hands, fingers): None, normal Lower (legs, knees, ankles, toes): None, normal, Trunk Movements Neck, shoulders, hips: None, normal, Overall Severity Severity of abnormal movements (highest score from questions above): None, normal Incapacitation due to abnormal movements: None, normal Patient's awareness of abnormal movements (rate only patient's report): No Awareness, Dental Status Current problems with teeth and/or dentures?: No Does patient usually wear dentures?: No    Psychiatric Specialty Exam: Presentation General Appearance: Appropriate for Environment; Casual; Fairly Groomed  Eye Contact: Fair  Speech: Clear and Coherent; Normal Rate  Volume: Normal  Handedness:Right  Mood and Affect  Mood: Euthymic, slightly depressed and slightly anxious, improved anxiety in general over the past few days Affect: Restricted but seems to be at baseline Thought Process  Thought Process: Coherent; Goal Directed; Linear  Descriptions of Associations: Intact yet concrete Thought Content Suicidal Thoughts:No With Intent; Without Plan Without Plan  Homicidal Thoughts:No Without Plan  Hallucinations: Reports improved completely hallucinations decrease the frequency and labs and denies any commanding hallucinations, denies VH, does not appear responding to stimuli during evaluation Ideas of Reference:None  Thought Content: No paranoia or other delusions reported Sensorium  Memory:Immediate Fair; Recent Fair  Judgment:Fair  Insight: Improved  Art therapist  Orientation:Full (Time, Place and  Person)  Language:Good  Concentration:Good  Attention:Good  Recall:Good  Fund of Knowledge:Good  Psychomotor Activity  Psychomotor Activity:Normal  Assets  Assets:Communication Skills; Desire for Improvement  Sleep  Quality:Good  Documented sleep last 24 hours: 8.5   Lab Results:  No results found for this or any previous visit (from the past 48 hour(s)).  Blood Alcohol level:  Lab Results  Component Value Date   ETH <10 02/24/2023   ETH <10 02/23/2023   Metabolic Disorder Labs: Lab Results  Component Value Date   HGBA1C 5.0 02/13/2023   MPG 96.8 02/13/2023   No results found for: "PROLACTIN" Lab Results  Component Value Date   CHOL 150 02/13/2023   TRIG 100 02/13/2023   HDL 53 02/13/2023   CHOLHDL 2.8 02/13/2023   VLDL 20 02/13/2023   LDLCALC 77 02/13/2023   ASSESSMENT / PLAN  Diagnoses / Active Problems: Principal Problem:   Schizophreniform psychosis (HCC) Active Problems:   Tobacco use disorder   Cannabis use disorder   Homelessness  Safety and Monitoring: Voluntary admission to inpatient psychiatric unit for safety, stabilization and treatment    2. Psychiatric Diagnoses and Treatment:  Schizophreniform vs Substance induced psychosis  Cannabis use d/o  Housing instability  Malingering Presented with psychosis x3-2mo, unclear etiology. Broad ddx, including substance induced, acute stress given his years of psychiatric stability (chart shows regular visit to PCP for ADHD and only few recent ED visits over the past year). He was recently discharged from [facility] (6/11-6/19) and returned for paranoia in the setting of THC use. Patient's concerns related to the voices being a "liability" due to thought broadcasting and insertion improving. Most recent AH 6/28 and passive SI 6/26. Increased anxiety about hospital stay during the day.   Encouraged cessation     Continue cymbalta 60 mg po daily  Continue zyprexa 20 mg po daily at bedtime. Continue Atarax  50 mg every 8 hours as needed for anxiety, helpful with no side effects  Tobacco use d/o  NRTs Encouraged cessation   Stimulant misuse d/o  Reported adherent to adderall for years and reported still taking it, however UDS negative for amphetamines. Possible diversion, maybe he didn't take it 24h prior to UDS. Discontinued home adderall 20mg  + 10mg  daily   3. Medical Issues Being Addressed:  HTN, stable Likely 2/2 cymbalta, since BP was normotensive prior to starting cymbalta. BP well controlled on lisinopril.  Continue lisinopril 5 mg po daily at bedtime.   4. Routine and other pertinent labs:   BMI:29.05 CMP Ca  8.4, ALT 52 CBC WNL PT/INR/PTT WNL CTH no acute processes  Qtc 470 UDS+THC BAL <10  5. Disposition Planning:  Tentative Date: 7/01 Barrier: homelessness  Location: shelter  Treatment Plan Summary: I certify that inpatient services furnished can reasonably be expected to improve the patient's condition.   Daily contact with patient to assess and evaluate symptoms and progress in treatment and Medication management Daily contact with patient to assess and evaluate symptoms and progress in treatment Patient's case to be discussed in multi-disciplinary team meeting Observation Level: q15 minute checks  Vital signs: q12 hours Precautions: suicide, elopement, and assault The risks/benefits/side-effects/alternatives to this medication were discussed in detail with the patient and time was given for questions. The patient consents to medication trial. The patient consents to medication trial. FDA black box warnings, if present, were discussed. Metabolic profile and EKG monitoring obtained while on an atypical antipsychotic  Encouraged patient to participate in unit milieu and in scheduled group therapies   Total Time Spent in Direct Patient Care:  I personally spent 35 minutes on the unit in direct patient care. The direct patient care time included face-to-face time with  the patient, reviewing the patient's chart, communicating with other professionals, and coordinating care. Greater than 50% of this time was spent in counseling or coordinating care with the patient regarding goals of  hospitalization, psycho-education, and discharge planning needs.    Sarita Bottom, MD Psychiatrist  03/05/2023 10:45

## 2023-03-05 NOTE — Progress Notes (Signed)
   03/04/23 2145  Psych Admission Type (Psych Patients Only)  Admission Status Voluntary  Psychosocial Assessment  Patient Complaints None  Eye Contact Fair  Facial Expression Flat  Affect Appropriate to circumstance  Speech Logical/coherent  Interaction Assertive  Motor Activity Other (Comment) (WNL)  Appearance/Hygiene Unremarkable  Behavior Characteristics Cooperative;Calm  Mood Pleasant  Thought Process  Coherency WDL  Content WDL  Delusions None reported or observed  Perception WDL  Hallucination None reported or observed  Judgment Impaired  Confusion None  Danger to Self  Current suicidal ideation? Denies  Self-Injurious Behavior No self-injurious ideation or behavior indicators observed or expressed   Agreement Not to Harm Self Yes  Description of Agreement verbal  Danger to Others  Danger to Others None reported or observed   Pt was offered support and encouragement. Pt was given scheduled medications. Pt was encouraged to attend group. Q 15 minute checks were done for safety. Pt interacts well with peers and staff. Pt receptive to treatment and safety maintained on unit.

## 2023-03-05 NOTE — Group Note (Signed)
[facility] [name] Group Therapy Note    Group Date: 03/05/2023 Start Time: 1000 End Time: 1100  Type of Therapy and Topic:  Group Therapy:  Overcoming Obstacles  Participation Level:  [facility] PARTICIPATION LEVEL: Did Not Attend    Description of Group:   In this group patients will be encouraged to explore what they see as obstacles to their own wellness and recovery. They will be guided to discuss their thoughts, feelings, and behaviors related to these obstacles. The group will process together ways to cope with barriers, with attention given to specific choices patients can make. Each patient will be challenged to identify changes they are motivated to make in order to overcome their obstacles. This group will be process-oriented, with patients participating in exploration of their own experiences as well as giving and receiving support and challenge from other group members.  Therapeutic Goals: 1. Patient will identify personal and current obstacles as they relate to admission. 2. Patient will identify barriers that currently interfere with their wellness or overcoming obstacles.  3. Patient will identify feelings, thought process and behaviors related to these barriers. 4. Patient will identify two changes they are willing to make to overcome these obstacles:    Summary of Patient Progress Did not attend.    Therapeutic Modalities:   Cognitive Behavioral Therapy Solution Focused Therapy Motivational Interviewing Relapse Prevention Therapy   Kelii Chittum A Kylo Gavin, [name] 

## 2023-03-06 DIAGNOSIS — F2081 Schizophreniform disorder: Secondary | ICD-10-CM | POA: Diagnosis not present

## 2023-03-06 MED ORDER — OLANZAPINE 20 MG PO TABS: 20.0000 mg | ORAL_TABLET | Freq: Every day | ORAL | 0 refills | Status: DC

## 2023-03-06 MED ORDER — LISINOPRIL 5 MG PO TABS: 5.0000 mg | ORAL_TABLET | Freq: Every day | ORAL | 0 refills | Status: DC

## 2023-03-06 MED ORDER — HYDROXYZINE HCL 50 MG PO TABS: 50.0000 mg | ORAL_TABLET | Freq: Three times a day (TID) | ORAL | 0 refills | Status: DC | PRN

## 2023-03-06 MED ORDER — DULOXETINE HCL 60 MG PO CPEP: 60.0000 mg | ORAL_CAPSULE | Freq: Every day | ORAL | 0 refills | Status: DC

## 2023-03-06 NOTE — Plan of Care (Signed)
  Problem: Clinical Measurements: Goal: Ability to maintain clinical measurements within normal limits will improve Outcome: Completed/Met Goal: Will remain free from infection Outcome: Completed/Met Goal: Diagnostic test results will improve Outcome: Completed/Met Goal: Respiratory complications will improve Outcome: Completed/Met Goal: Cardiovascular complication will be avoided Outcome: Completed/Met   

## 2023-03-06 NOTE — Progress Notes (Signed)
  [facility] Adult Case Management Discharge Plan :  Will you be returning to the same living situation after discharge:  Yes,  Patient will be returning to the [facility] and then going to an [facility] around 5PM  At discharge, do you have transportation home?: Yes,  CSW arranged a Taxi for patient to get to the [facility] and then a bus pass to get to the [facility]  Do you have the ability to pay for your medications: No. Pharmacy Cards provided   Release of information consent forms completed and in the chart;  Patient's signature needed at discharge.  Patient to Follow up at:  Follow-up Information     [facility] Follow up on 03/30/2023.   Specialty: Behavioral Health Why: You have an appointment for therapy services on 03/30/23 at 10:00 am, (this is Virtual).  You also have an appointment for medication management services on 04/03/23 at 1:30 pm, in person. Contact information: [phone] [place] [address] [phone]                Next level of care provider has access to [facility] Link:yes  Safety Planning and Suicide Prevention discussed: No.Patient declined      Has patient been referred to the Quitline?: Patient refused referral for treatment  Patient has been referred for addiction treatment: No known substance use disorder.  Isabella Bowens, LCSWA 03/06/2023, 11:58 AM

## 2023-03-06 NOTE — Plan of Care (Signed)
  Problem: Education: Goal: Knowledge of General Education information will improve Description: Including pain rating scale, medication(s)/side effects and non-pharmacologic comfort measures Outcome: Completed/Met   

## 2023-03-06 NOTE — Progress Notes (Signed)
   03/05/23 2140  Psych Admission Type (Psych Patients Only)  Admission Status Voluntary  Psychosocial Assessment  Patient Complaints Anxiety  Eye Contact Fair  Facial Expression Flat  Affect Appropriate to circumstance  Speech Logical/coherent  Interaction Assertive  Motor Activity Other (Comment) (WNL)  Appearance/Hygiene Unremarkable  Behavior Characteristics Cooperative;Appropriate to situation  Mood Anxious;Pleasant  Thought Process  Coherency WDL  Content WDL  Delusions None reported or observed  Perception WDL  Hallucination None reported or observed  Judgment Impaired  Confusion None  Danger to Self  Current suicidal ideation? Denies  Self-Injurious Behavior No self-injurious ideation or behavior indicators observed or expressed   Agreement Not to Harm Self Yes  Description of Agreement verbal  Danger to Others  Danger to Others None reported or observed   Pt was offered support and encouragement. Pt was given scheduled medications. PRN Hydroxyzine was given per [facility]. Q 15 minute checks were done for safety. Pt did not attend group. Interacts well with peers and staff. Pt has no complaints.Pt receptive to treatment and safety maintained on unit.

## 2023-03-06 NOTE — Plan of Care (Signed)
  Problem: Clinical Measurements: Goal: Ability to maintain clinical measurements within normal limits will improve Outcome: Completed/Met Goal: Will remain free from infection Outcome: Completed/Met Goal: Diagnostic test results will improve Outcome: Completed/Met Goal: Respiratory complications will improve Outcome: Completed/Met Goal: Cardiovascular complication will be avoided Outcome: Completed/Met   Problem: Health Behavior/Discharge Planning: Goal: Ability to manage health-related needs will improve Outcome: Completed/Met

## 2023-03-06 NOTE — Discharge Summary (Signed)
Physician Discharge Summary Note  Patient:  William Romero is an 42 y.o., male MRN:  [id number] DOB:  1980/12/19 Patient phone:  [phone] (home)  Patient address:   [address] [place] [facility] [address],  Total Time spent with patient: 45 minutes  Date of Admission:  02/25/2023 Date of Discharge: 03/06/2023  Reason for Admission:  William Romero is a 42 y.o. male with PMH of MDD, ADHD, homelessness and schizophrenia per chart, who presented voluntary to [facility] 02/23/23. Of note, patient was discharged from [facility] 6/19 from a 6/11-6/19 [facility] admission.   Principal Problem: Schizophrenia [facility]) Discharge Diagnoses: Principal Problem:   Schizophrenia (HCC) Active Problems:   Tobacco use disorder   Cannabis use disorder   Homelessness   Past Psychiatric History:  Diagnoses: MDD, ADHD and schizophrenia Inpatient treatment: ~6 between 2012-2019 Suicide: Denied Homicide: Denied Medication history: adderall, bupropion Medication compliance: Poor, except to adderall  Past Medical History:  Past Medical History:  Diagnosis Date   Schizophrenia [facility])     Past Surgical History:  Procedure Laterality Date   APPENDECTOMY      Family History: History reviewed. No pertinent family history. Family Psychiatric  History:  Denies family psych history Social History:  Social History   Substance and Sexual Activity  Alcohol Use Not Currently   Alcohol/week: 5.0 standard drinks of alcohol   Types: 5 Cans of beer per week     Social History   Substance and Sexual Activity  Drug Use No    Social History   Socioeconomic History   Marital status: Single    Spouse name: Not on file   Number of children: Not on file   Years of education: Not on file   Highest education level: Not on file  Occupational History   Not on file  Tobacco Use   Smoking status: Former    Packs/day: .5    Types: Cigarettes   Smokeless tobacco: Never  Vaping Use   Vaping Use: Never used   Substance and Sexual Activity   Alcohol use: Not Currently    Alcohol/week: 5.0 standard drinks of alcohol    Types: 5 Cans of beer per week   Drug use: No   Sexual activity: Not on file  Other Topics Concern   Not on file  Social History Narrative   Not on file   Social Determinants of Health   Financial Resource Strain: Not on file  Food Insecurity: No Food Insecurity (02/25/2023)   Hunger Vital Sign    Worried About Running Out of Food in the Last Year: Never true    Ran Out of Food in the Last Year: Never true  Recent Concern: Food Insecurity - Food Insecurity Present (02/24/2023)   Hunger Vital Sign    Worried About Running Out of Food in the Last Year: Often true    Ran Out of Food in the Last Year: Sometimes true  Transportation Needs: No Transportation Needs (02/25/2023)   PRAPARE - Administrator, Civil Service (Medical): No    Lack of Transportation (Non-Medical): No  Physical Activity: Not on file  Stress: Not on file  Social Connections: Not on file   Housing: unhoused Finances: unemployed Children: NA Guns/Weapons: Denied Legal: jail - reported for self defense  Substance Use History:  Alcohol: intermittent Nicotine:  reports that he has quit smoking. His smoking use included cigarettes. He smoked an average of .5 packs per day. He has never used smokeless tobacco. Marijuana: Yes  IV drug use: Denied Stimulants: Possible stimulant misuse  Opiates: Denied Sedative/hypnotics: Denied Hallucinogens: Denied H/O withdrawals, blackouts: Denied H/O DT: Denied H/O Detox / Rehab: Denied DUI/DWI: Denied  Hospital Course:   During the patient's hospitalization, patient had extensive initial psychiatric evaluation, and follow-up psychiatric evaluations every day.   Psychiatric diagnoses provided upon initial assessment: Schizophrenia, chronic paranoid   Patient's psychiatric medications were adjusted on admission: Started Zyprexa 10 mg at bedtime for  psychosis and sleep, started Cymbalta 20 mg nightly for depression and anxiety, patient also was started on Vistaril as needed for anxiety   During the hospitalization, other adjustments were made to the patient's psychiatric medication regimen: Zyprexa was titrated up gradually to 20 mg at bedtime to address psychosis and sleep, Cymbalta was titrated up gradually to 60 mg daily for depression symptoms, Vistaril was titrated up from 25 to 50 mg 3 times daily as needed for anxiety.   Patient's care was discussed during the interdisciplinary team meeting every day during the hospitalization. In the hospital stay it was noted that patient's psychosis improved significantly during hospital stay he noted decreased the frequency and severity tolerated hallucinations and paranoia being more able to distract himself from negative thoughts and function during the day, he was noted to be gradually interactive with limitations in the milieu.  He was counseled repeatedly in regard to to comply with medication and follow-up appointments after discharge to decrease risk of decompensation.  He was counseled repeatedly regarding need to abstain completely from marijuana use.  He was able to be accepted to [facility] house on day of discharge but he wanted to be discharged first to [facility] then he will be provided but stated to take him to [place] house later in the day. The patient denied having side effects to prescribed psychiatric medication.   Gradually, patient started adjusting to milieu. The patient was evaluated each day by a clinical provider to ascertain response to treatment. Improvement was noted by the patient's report of decreasing symptoms, improved sleep and appetite, affect, medication tolerance, behavior, and participation in unit programming.  Patient was asked each day to complete a self inventory noting mood, mental status, pain, new symptoms, anxiety and concerns.     Symptoms were reported as significantly  decreased or resolved completely by discharge.    On day of discharge, patient was evaluated on 03/06/2023 the patient reports that their mood is stable. The patient denied having suicidal thoughts for more than 48 hours prior to discharge.  Patient denies having homicidal thoughts.  Patient denies having auditory hallucinations.  Patient denies any visual hallucinations or other symptoms of psychosis. The patient was motivated to continue taking medication with a goal of continued improvement in mental health.    The patient reports their target psychiatric symptoms of worsening related hallucinations and paranoia responded well to the psychiatric medications, and the patient reports overall benefit other psychiatric hospitalization. Supportive psychotherapy was provided to the patient. The patient also participated in regular group therapy while hospitalized. Coping skills, problem solving as well as relaxation therapies were also part of the unit programming.   Labs were reviewed with the patient, and abnormal results were discussed with the patient.   The patient is able to verbalize their individual safety plan to this provider.   Behavioral Events: None   Restraints: None   Groups: Limited attendance and participation   Medications Changes: As above  Physical Findings: AIMS: Facial and Oral Movements Muscles of Facial Expression: None, normal Lips  and Perioral Area: None, normal Jaw: None, normal Tongue: None, normal,Extremity Movements Upper (arms, wrists, hands, fingers): None, normal Lower (legs, knees, ankles, toes): None, normal, Trunk Movements Neck, shoulders, hips: None, normal, Overall Severity Severity of abnormal movements (highest score from questions above): None, normal Incapacitation due to abnormal movements: None, normal Patient's awareness of abnormal movements (rate only patient's report): No Awareness, Dental Status Current problems with teeth and/or dentures?:  No Does patient usually wear dentures?: No  CIWA:  CIWA-Ar Total: 0 COWS:     Musculoskeletal: Strength & Muscle Tone: within normal limits Gait & Station: normal Patient leans: N/A   Psychiatric Specialty Exam:  General Appearance: appears at stated age, fairly dressed and groomed  Behavior: pleasant and cooperative  Psychomotor Activity:No psychomotor agitation or retardation noted   Eye Contact: good Speech: normal amount, tone, volume and latency   Mood: euthymic Affect: congruent, pleasant and interactive  Thought Process: linear, goal directed, no circumstantial or tangential thought process noted, no racing thoughts or flight of ideas Descriptions of Associations: intact Thought Content: Hallucinations: denies VH , does not appear responding to stimuli Reports ongoing auditory hallucinations at baseline improved since admitted with much decreased frequency and severity, able to distract himself and "I can ignore it" Delusions: Describes vague paranoia but much decreased intensity since admitted, no other delusions noted Suicidal Thoughts: denies SI, intention, plan  Homicidal Thoughts: denies HI, intention, plan   Alertness/Orientation: alert and fully oriented  Insight: fair, improved Judgment: fair, improved  Memory: intact  Executive Functions  Concentration: intact  Attention Span: Fair Recall: intact Fund of Knowledge: fair   Assets  Assets: Manufacturing systems engineer; Desire for Improvement   Sleep Sleep: Fair, improved during hospital stay   Physical Exam:  Physical Exam Vitals and nursing note reviewed.  Constitutional:      Appearance: Normal appearance.  HENT:     Head: Normocephalic and atraumatic.     Nose: Nose normal.  Eyes:     Pupils: Pupils are equal, round, and reactive to light.  Pulmonary:     Effort: Pulmonary effort is normal.  Musculoskeletal:        General: Normal range of motion.     Cervical back: Normal range of  motion.  Neurological:     General: No focal deficit present.     Mental Status: He is alert and oriented to person, place, and time.  Psychiatric:        Mood and Affect: Mood normal.        Behavior: Behavior normal.        Judgment: Judgment normal.    Review of Systems  All other systems reviewed and are negative.  Blood pressure 108/81, pulse 97, temperature (!) 97.5 F (36.4 C), temperature source Oral, resp. rate 18, height 6' (1.829 m), weight 97.2 kg, SpO2 98 %. Body mass index is 29.05 kg/m.   Social History   Tobacco Use  Smoking Status Former   Packs/day: .5   Types: Cigarettes  Smokeless Tobacco Never   Tobacco Cessation:  N/A, patient does not currently use tobacco products   Blood Alcohol level:  Lab Results  Component Value Date   ETH <10 02/24/2023   ETH <10 02/23/2023    Metabolic Disorder Labs:  Lab Results  Component Value Date   HGBA1C 5.0 02/13/2023   MPG 96.8 02/13/2023   No results found for: "PROLACTIN" Lab Results  Component Value Date   CHOL 150 02/13/2023   TRIG 100 02/13/2023  HDL 53 02/13/2023   CHOLHDL 2.8 02/13/2023   VLDL 20 02/13/2023   LDLCALC 77 02/13/2023    See Psychiatric Specialty Exam and Suicide Risk Assessment completed by Attending Physician prior to discharge.  Discharge destination:  Other:  [facility]  Is patient on multiple antipsychotic therapies at discharge:  No   Has Patient had three or more failed trials of antipsychotic monotherapy by history:  No  Recommended Plan for Multiple Antipsychotic Therapies: NA  Discharge Instructions     Diet - low sodium heart healthy   Complete by: As directed    Increase activity slowly   Complete by: As directed       Allergies as of 03/06/2023   No Known Allergies      Medication List     STOP taking these medications    gabapentin 300 MG capsule Commonly known as: NEURONTIN   traZODone 50 MG tablet Commonly known as: DESYREL       TAKE  these medications      Indication  DULoxetine 60 MG capsule Commonly known as: CYMBALTA Take 1 capsule (60 mg total) by mouth daily. Start taking on: March 07, 2023  Indication: Major Depressive Disorder   hydrOXYzine 50 MG tablet Commonly known as: ATARAX Take 1 tablet (50 mg total) by mouth 3 (three) times daily as needed for anxiety. What changed:  medication strength how much to take  Indication: Feeling Anxious   lisinopril 5 MG tablet Commonly known as: ZESTRIL Take 1 tablet (5 mg total) by mouth at bedtime.  Indication: High Blood Pressure Disorder   OLANZapine 20 MG tablet Commonly known as: ZYPREXA Take 1 tablet (20 mg total) by mouth at bedtime. What changed:  medication strength how much to take when to take this  Indication: Schizophrenia        Follow-up Information     [facility] Follow up on 03/30/2023.   Specialty: Behavioral Health Why: You have an appointment for therapy services on 03/30/23 at 10:00 am, (this is Virtual).  You also have an appointment for medication management services on 04/03/23 at 1:30 pm, in person. Contact information: [phone] [place] [address] [phone]                Discharge recommendations:    Activity: as tolerated  Diet: heart healthy  # It is recommended to the patient to continue psychiatric medications as prescribed, after discharge from the hospital.     # It is recommended to the patient to follow up with your outpatient psychiatric provider and PCP.   # It was discussed with the patient, the impact of alcohol, drugs, tobacco have been there overall psychiatric and medical wellbeing, and total abstinence from substance use was recommended the patient.ed.   # Prescriptions provided or sent directly to preferred pharmacy at discharge. Patient agreeable to plan. Given opportunity to ask questions. Appears to feel comfortable with discharge.    # In the  event of worsening symptoms, the patient is instructed to call the crisis hotline, 911 and or go to the nearest ED for appropriate evaluation and treatment of symptoms. To follow-up with primary care provider for other medical issues, concerns and or health care needs   # Patient was discharged home with a plan to follow up as noted above.  -Follow-up with outpatient primary care doctor and other specialists -for management of chronic medical disease, including: Patient was recommended to follow-up with primary care provider for  hypertension, he agrees.    Patient agrees with D/C instructions and plan.   The patient received suicide prevention pamphlet:  Yes Belongings returned:  Clothing and Valuables  Total Time Spent in Direct Patient Care:  I personally spent 45 minutes on the unit in direct patient care. The direct patient care time included face-to-face time with the patient, reviewing the patient's chart, communicating with other professionals, and coordinating care. Greater than 50% of this time was spent in counseling or coordinating care with the patient regarding goals of hospitalization, psycho-education, and discharge planning needs.    SignedSarita Bottom, MD 03/06/2023, 11:28 AM

## 2023-03-06 NOTE — Progress Notes (Signed)
   03/06/23 0559  15 Minute Checks  Location Bedroom  Visual Appearance Calm  Behavior Sleeping  Sleep (Behavioral Health Patients Only)  Calculate sleep? (Click Yes once per 24 hr at 0600 safety check) Yes  Documented sleep last 24 hours 8.5

## 2023-03-06 NOTE — Transportation (Signed)
03/06/2023  William Romero DOB: Oct 06, 1980 MRN: [id number]   RIDER WAIVER AND RELEASE OF LIABILITY  For the purposes of helping with transportation needs, [facility] partners with outside transportation providers (taxi companies, [place], Catering manager.) to give [employer] patients or other approved people the choice of on-demand rides [employer]") to our buildings for non-emergency visits.  By using [employer], I, the person signing this document, on behalf of myself and/or any legal minors (in my care using the [employer]), agree:  Science writer given to me are supplied by independent, outside transportation providers who do not work for, or have any affiliation with, [employer]. [place] is not a transportation company. [place] has no control over the quality or safety of the rides I get using [employer]. [place] has no control over whether any outside ride will happen on time or not. [place] gives no guarantee on the reliability, quality, safety, or availability on any rides, or that no mistakes will happen. I know and accept that traveling by vehicle (car, truck, [employer], Zenaida Niece, bus, taxi, etc.) has risks of serious injuries such as disability, being paralyzed, and death. I know and agree the risk of using [employer] is mine alone, and not [employer]. Transport Services are provided "as is" and as are available. The transportation providers are in charge for all inspections and care of the vehicles used to provide these rides. I agree not to take legal action against [place], its agents, employees, officers, directors, representatives, insurers, attorneys, assigns, successors, subsidiaries, and affiliates at any time for any reasons related directly or indirectly to using [employer]. I also agree not to take legal action against [place] or its affiliates for any injury, death, or damage to property caused by or related to using  [employer]. I have read this Waiver and Release of Liability, and I understand the terms used in it and their legal meaning. This Waiver is freely and voluntarily given with the understanding that my right (or any legal minors) to legal action against [place] relating to [employer] is knowingly given up to use these services.   I attest that I read the Ride Waiver and Release of Liability to William Romero, gave William Romero the opportunity to ask questions and answered the questions asked (if any). I affirm that William Romero then provided consent for assistance with transportation.

## 2023-03-06 NOTE — [facility] Suicide Risk Assessment (Signed)
[facility] INPATIENT:  Family/Significant Other Suicide Prevention Education  Suicide Prevention Education:  Patient Refusal for Family/Significant Other Suicide Prevention Education: The patient William Romero has refused to provide written consent for family/significant other to be provided Family/Significant Other Suicide Prevention Education during admission and/or prior to discharge.  Physician notified.  Isabella Bowens 03/06/2023, 11:37 AM

## 2023-03-06 NOTE — Group Note (Signed)
Recreation Therapy Group Note   Group Topic:Problem Solving  Group Date: 03/06/2023 Start Time: 0935 End Time: 1010 Facilitators: Shanaiya Bene-McCall, LRT,CTRS Location: [address] Dayroom   Goal Area(s) Addresses:  Patient will effectively work with peer towards shared goal.  Patient will identify skills used to make activity successful.  Patient will identify how skills used during activity can be used to reach post d/c goals.   Group Description: Landing Pad. In teams of 3-5, patients were given 12 plastic drinking straws and an equal length of masking tape. Using the materials provided, patients were asked to build a landing pad to catch a golf ball dropped from approximately 5 feet in the air. All materials were required to be used by the team in their design. LRT facilitated post-activity discussion.   Affect/Mood: N/A   Participation Level: Did not attend    Clinical Observations/Individualized Feedback:      Plan: Continue to engage patient in RT group sessions 2-3x/week.   Domnick Chervenak-McCall, LRT,CTRS 03/06/2023 12:49 PM

## 2023-03-06 NOTE — BHH Suicide Risk Assessment (Signed)
[facility] Discharge Suicide Risk Assessment   Principal Problem: Schizophrenia [facility]) Discharge Diagnoses: Principal Problem:   Schizophrenia (HCC) Active Problems:   Tobacco use disorder   Cannabis use disorder   Homelessness   Total Time spent with patient: 45 minutes  Reason for admission:  William Romero is a 42 y.o. male with PMH of MDD, ADHD, homelessness and schizophrenia per chart, who presented voluntary to [facility] 02/23/23. Of note, patient was discharged from [facility] 6/19 from a 6/11-6/19 [facility] admission.   PTA Medications:  Noncompliant with psychotropic medications prior to admission  Hospital Course:   During the patient's hospitalization, patient had extensive initial psychiatric evaluation, and follow-up psychiatric evaluations every day.  Psychiatric diagnoses provided upon initial assessment: Schizophrenia, chronic paranoid  Patient's psychiatric medications were adjusted on admission: Started Zyprexa 10 mg at bedtime for psychosis and sleep, started Cymbalta 20 mg nightly for depression and anxiety, patient also was started on Vistaril as needed for anxiety  During the hospitalization, other adjustments were made to the patient's psychiatric medication regimen: Zyprexa was titrated up gradually to 20 mg at bedtime to address psychosis and sleep, Cymbalta was titrated up gradually to 60 mg daily for depression symptoms, Vistaril was titrated up from 25 to 50 mg 3 times daily as needed for anxiety.  Patient's care was discussed during the interdisciplinary team meeting every day during the hospitalization. In the hospital stay it was noted that patient's psychosis improved significantly during hospital stay he noted decreased the frequency and severity tolerated hallucinations and paranoia being more able to distract himself from negative thoughts and function during the day, he was noted to be gradually interactive with limitations in the milieu.  He was counseled repeatedly in  regard to to comply with medication and follow-up appointments after discharge to decrease risk of decompensation.  He was counseled repeatedly regarding need to abstain completely from marijuana use.  He was able to be accepted to [facility] house on day of discharge but he wanted to be discharged first to [facility] then he will be provided but stated to take him to [place] house later in the day. The patient denied having side effects to prescribed psychiatric medication.  Gradually, patient started adjusting to milieu. The patient was evaluated each day by a clinical provider to ascertain response to treatment. Improvement was noted by the patient's report of decreasing symptoms, improved sleep and appetite, affect, medication tolerance, behavior, and participation in unit programming.  Patient was asked each day to complete a self inventory noting mood, mental status, pain, new symptoms, anxiety and concerns.    Symptoms were reported as significantly decreased or resolved completely by discharge.   On day of discharge, patient was evaluated on 03/06/2023 the patient reports that their mood is stable. The patient denied having suicidal thoughts for more than 48 hours prior to discharge.  Patient denies having homicidal thoughts.  Patient denies having auditory hallucinations.  Patient denies any visual hallucinations or other symptoms of psychosis. The patient was motivated to continue taking medication with a goal of continued improvement in mental health.   The patient reports their target psychiatric symptoms of worsening related hallucinations and paranoia responded well to the psychiatric medications, and the patient reports overall benefit other psychiatric hospitalization. Supportive psychotherapy was provided to the patient. The patient also participated in regular group therapy while hospitalized. Coping skills, problem solving as well as relaxation therapies were also part of the unit programming.  Labs  were reviewed with the patient,  and abnormal results were discussed with the patient.  The patient is able to verbalize their individual safety plan to this provider.  Behavioral Events: None  Restraints: None  Groups: Limited attendance and participation  Medications Changes: As above   Sleep  Sleep:No data recorded  Musculoskeletal: Strength & Muscle Tone: within normal limits Gait & Station: normal Patient leans: N/A  Psychiatric Specialty Exam  General Appearance: appears at stated age, fairly dressed and groomed  Behavior: pleasant and cooperative  Psychomotor Activity:No psychomotor agitation or retardation noted   Eye Contact: good Speech: normal amount, tone, volume and latency   Mood: euthymic Affect: congruent, pleasant and interactive  Thought Process: linear, goal directed, no circumstantial or tangential thought process noted, no racing thoughts or flight of ideas Descriptions of Associations: intact Thought Content: Hallucinations: denies AH, VH , does not appear responding to stimuli Delusions: No paranoia or other delusions noted Suicidal Thoughts: denies SI, intention, plan  Homicidal Thoughts: denies HI, intention, plan   Alertness/Orientation: alert and fully oriented  Insight: fair, improved Judgment: fair, improved  Memory: intact  Executive Functions  Concentration: intact  Attention Span: Fair Recall: intact Fund of Knowledge: fair   Art therapist  Concentration: intact Attention Span: Fair Recall: intact Fund of Knowledge: fair   Assets  Assets: Manufacturing systems engineer; Desire for Improvement   Physical Exam: Physical Exam ROS Blood pressure 108/81, pulse 97, temperature (!) 97.5 F (36.4 C), temperature source Oral, resp. rate 18, height 6' (1.829 m), weight 97.2 kg, SpO2 98 %. Body mass index is 29.05 kg/m.  Mental Status Per Nursing Assessment::   On Admission:  Self-harm thoughts  Demographic Factors:  Male  and Unemployed  Loss Factors: NA  Historical Factors: NA  Risk Reduction Factors:   NA  Continued Clinical Symptoms: Symptoms improved significantly during hospital stay Depression:   Anhedonia Delusional Schizophrenia:   Depressive state Paranoid or undifferentiated type  Cognitive Features That Contribute To Risk:  None    Suicide Risk:  Minimal: No identifiable suicidal ideation.  Patients presenting with no risk factors but with morbid ruminations; may be classified as minimal risk based on the severity of the depressive symptoms   Follow-up Information     [facility] Follow up on 03/30/2023.   Specialty: Behavioral Health Why: You have an appointment for therapy services on 03/30/23 at 10:00 am, (this is Virtual).  You also have an appointment for medication management services on 04/03/23 at 1:30 pm, in person. Contact information: [phone] [place] [address] [phone]                Plan Of Care/Follow-up recommendations:    Discharge recommendations:    Activity: as tolerated  Diet: heart healthy  # It is recommended to the patient to continue psychiatric medications as prescribed, after discharge from the hospital.     # It is recommended to the patient to follow up with your outpatient psychiatric provider and PCP.   # It was discussed with the patient, the impact of alcohol, drugs, tobacco have been there overall psychiatric and medical wellbeing, and total abstinence from substance use was recommended the patient.ed.   # Prescriptions provided or sent directly to preferred pharmacy at discharge. Patient agreeable to plan. Given opportunity to ask questions. Appears to feel comfortable with discharge.    # In the event of worsening symptoms, the patient is instructed to call the crisis hotline, 911 and or go to the nearest ED  for appropriate evaluation and treatment of symptoms. To follow-up with  primary care provider for other medical issues, concerns and or health care needs   # Patient was discharged home with a plan to follow up as noted above.  -Follow-up with outpatient primary care doctor and other specialists -for management of chronic medical disease, including: Patient was recommended to follow-up with primary care provider for hypertension, he agrees.   Patient agrees with D/C instructions and plan.  The patient received suicide prevention pamphlet:  Yes Belongings returned:  Clothing and Valuables  Total Time Spent in Direct Patient Care:  I personally spent 45 minutes on the unit in direct patient care. The direct patient care time included face-to-face time with the patient, reviewing the patient's chart, communicating with other professionals, and coordinating care. Greater than 50% of this time was spent in counseling or coordinating care with the patient regarding goals of hospitalization, psycho-education, and discharge planning needs.   Meekah Math 03/06/2023, 11:21 AM   Natahlia Hoggard Abbott Pao, MD 03/06/2023, 11:21 AM

## 2023-03-07 NOTE — BHH Group Notes (Signed)
Spiritual care group on grief and loss facilitated by chaplain Katy Kenn Rekowski, B[facility]   Group Goal:   Support / Education around grief and loss   Members engage in facilitated group support and psycho-social education.   Group Description:   Following introductions and group rules, group members engaged in facilitated group dialog and support around topic of loss, with particular support around experiences of loss in their lives. Group Identified types of loss (relationships / self / things) and identified patterns, circumstances, and changes that precipitate losses. Reflected on thoughts / feelings around loss, normalized grief responses, and recognized variety in grief experience. Group noted Worden's four tasks of grief in discussion.   Group drew on Adlerian / Rogerian, narrative, MI,   Patient Progress: Did not attend.  

## 2023-03-22 ENCOUNTER — Telehealth (INDEPENDENT_AMBULATORY_CARE_PROVIDER_SITE_OTHER): Payer: Self-pay | Admitting: Internal Medicine

## 2023-03-22 ENCOUNTER — Encounter: Payer: Self-pay | Admitting: Internal Medicine

## 2023-03-22 VITALS — Wt 218.0 lb

## 2023-03-22 DIAGNOSIS — F200 Paranoid schizophrenia: Secondary | ICD-10-CM

## 2023-03-22 DIAGNOSIS — F900 Attention-deficit hyperactivity disorder, predominantly inattentive type: Secondary | ICD-10-CM

## 2023-03-22 NOTE — Progress Notes (Signed)
Virtual Visit via Video Note  I connected with William Romero on 03/22/23 at  8:00 AM EDT by a video enabled telemedicine application and verified that I am speaking with the correct person using two identifiers.  Location patient: home Location provider: work office Persons participating in the virtual visit: patient, provider  I discussed the limitations of evaluation and management by telemedicine and the availability of in person appointments. The patient expressed understanding and agreed to proceed.   HPI: He is scheduled this visit to discuss medication refills.  I had been prescribing Adderall for him long-term for his ADHD.  He also has a history of schizophreniform disorder/schizophrenia.  In the month of June he had two [facility] hospitalizations.  It was recommended that he should no longer be on Adderall.  He was discharged on a combination of Cymbalta 60 mg, Zyprexa 20 mg and hydroxyzine as needed.  He has been feeling very well since discharge.  He found a place to stay.  On video visit today he looks much more groomed and put together.  He has his first outpatient appointment with behavioral health at the end of the month.  Unfortunately he decided that he did not like the Zyprexa so he discontinued it.   ROS: Negative unless indicated in HPI.  Past Medical History:  Diagnosis Date   Schizophrenia [facility])     Past Surgical History:  Procedure Laterality Date   APPENDECTOMY      No family history on file.  SOCIAL HX:   reports that he has quit smoking. His smoking use included cigarettes. He has never used smokeless tobacco. He reports that he does not currently use alcohol after a past usage of about 5.0 standard drinks of alcohol per week. He reports that he does not use drugs.   Current Outpatient Medications:    DULoxetine (CYMBALTA) 60 MG capsule, Take 1 capsule (60 mg total) by mouth daily., Disp: 30 capsule, Rfl: 0   hydrOXYzine (ATARAX) 50 MG tablet, Take  1 tablet (50 mg total) by mouth 3 (three) times daily as needed for anxiety., Disp: 90 tablet, Rfl: 0   lisinopril (ZESTRIL) 5 MG tablet, Take 1 tablet (5 mg total) by mouth at bedtime., Disp: 30 tablet, Rfl: 0   OLANZapine (ZYPREXA) 20 MG tablet, Take 1 tablet (20 mg total) by mouth at bedtime., Disp: 30 tablet, Rfl: 0  EXAM:   VITALS per patient if applicable: None reported  GENERAL: alert, oriented, appears well and in no acute distress  HEENT: atraumatic, conjunttiva clear, no obvious abnormalities on inspection of external nose and ears  NECK: normal movements of the head and neck  LUNGS: on inspection no signs of respiratory distress, breathing rate appears normal, no obvious gross increased work of breathing, gasping or wheezing  CV: no obvious cyanosis  MS: moves all visible extremities without noticeable abnormality  PSYCH/NEURO: pleasant and cooperative, no obvious depression or anxiety, speech and thought processing grossly intact  ASSESSMENT AND PLAN:   Paranoid schizophrenia (HCC)  Attention deficit hyperactivity disorder (ADHD), predominantly inattentive type  -I have explained to [name] today that given behavioral health recommendations I will no longer be prescribing Adderall.  He is not happy with this decision as he feels like the Adderall helps keep him focused and "slows things down".  Have advised he discuss this with psychiatry at the end of the month. -I have told him that I believe it is wise for him to resume Zyprexa. -Have explained  importance of follow-through with behavioral health at the end of the month.   I discussed the assessment and treatment plan with the patient. The patient was provided an opportunity to ask questions and all were answered. The patient agreed with the plan and demonstrated an understanding of the instructions.   The patient was advised to call back or seek an in-person evaluation if the symptoms worsen or if the condition fails  to improve as anticipated.    William Jan, MD  [facility]

## 2023-03-30 ENCOUNTER — Telehealth (HOSPITAL_COMMUNITY): Payer: Self-pay | Admitting: Licensed Clinical Social Worker

## 2023-03-30 ENCOUNTER — Ambulatory Visit (INDEPENDENT_AMBULATORY_CARE_PROVIDER_SITE_OTHER): Payer: No Payment, Other | Admitting: Licensed Clinical Social Worker

## 2023-03-30 DIAGNOSIS — F1210 Cannabis abuse, uncomplicated: Secondary | ICD-10-CM

## 2023-03-30 DIAGNOSIS — F329 Major depressive disorder, single episode, unspecified: Secondary | ICD-10-CM

## 2023-03-30 DIAGNOSIS — F17200 Nicotine dependence, unspecified, uncomplicated: Secondary | ICD-10-CM

## 2023-03-30 DIAGNOSIS — F209 Schizophrenia, unspecified: Secondary | ICD-10-CM

## 2023-03-30 DIAGNOSIS — F411 Generalized anxiety disorder: Secondary | ICD-10-CM

## 2023-03-30 DIAGNOSIS — F32A Depression, unspecified: Secondary | ICD-10-CM | POA: Insufficient documentation

## 2023-03-30 DIAGNOSIS — F2081 Schizophreniform disorder: Secondary | ICD-10-CM | POA: Diagnosis not present

## 2023-03-30 DIAGNOSIS — F909 Attention-deficit hyperactivity disorder, unspecified type: Secondary | ICD-10-CM
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
## 2023-03-30 NOTE — Telephone Encounter (Signed)
[name] sent two text links to pt phone listed in epic with no response.Marland Kitchen [name] called pt phone with no response left HIPAA compliant VM. [name] waited until 1015 before disconnecting

## 2023-04-03 ENCOUNTER — Ambulatory Visit (INDEPENDENT_AMBULATORY_CARE_PROVIDER_SITE_OTHER): Payer: No Payment, Other | Admitting: Psychiatry

## 2023-04-03 ENCOUNTER — Encounter (HOSPITAL_COMMUNITY): Payer: Self-pay | Admitting: Psychiatry

## 2023-04-03 DIAGNOSIS — F32A Depression, unspecified: Secondary | ICD-10-CM

## 2023-04-03 DIAGNOSIS — F411 Generalized anxiety disorder: Secondary | ICD-10-CM | POA: Diagnosis not present

## 2023-04-03 DIAGNOSIS — F200 Paranoid schizophrenia: Secondary | ICD-10-CM | POA: Diagnosis not present

## 2023-04-03 MED ORDER — DULOXETINE HCL 40 MG PO CPEP
80.0000 mg | ORAL_CAPSULE | Freq: Two times a day (BID) | ORAL | 3 refills | Status: DC
Start: 2023-04-03 — End: 2023-04-12

## 2023-04-03 NOTE — Progress Notes (Signed)
Psychiatric Initial Adult Assessment   Patient Identification: William Romero MRN:  [id number] Date of Evaluation:  04/03/2023 Referral Source: [facility] Chief Complaint:  "I really like the Cymbalta but they Zyprexa made me foggy" Visit Diagnosis:    ICD-10-CM   1. Paranoid schizophrenia (HCC)  F20.0     2. GAD (generalized anxiety disorder)  F41.1 DULoxetine 40 MG CPEP    3. Mild depression  F32.A DULoxetine 40 MG CPEP      History of Present Illness: 42 year old male seen today for initial psychiatric evaluation.  He was referred to outpatient psychiatry by [facility] where he presented on 02/25/2023 through 03/06/2023 presenting with increasing auditory hallucinations over the last 3 to 4 months.  His medications were adjusted and he is currently managed on Zyprexa 20 mg nightly, hydroxyzine 50 mg 3 times daily, and Cymbalta 60 mg daily.  He notes that he discontinued Zyprexa and hydroxyzine.    Today he was well-groomed, pleasant, cooperative, and engaged in conversation.  Patient informed Clinical research associate that he likes Cymbalta but notes that Zyprexa made him feel foggy.  Patient continues to endorse auditory hallucinations.  He reports that his hallucinations are negative and persecutes him.  Provider recommended reducing Zyprexa or switching antipsychotics however patient was not agreeable.   Patient informed Clinical research associate that he continues to be anxious but notes that Cymbalta has effectively helped him.  He notes that he worries about something bad happening.  Today provider conducted GAD-7 and patient scored a 14.  Provider also conducted PHQ-9 the patient scored a 14.  He endorses adequate sleep and appetite.  Today he denies SI/HI/VH.  He does note that he is often distracted, has racing thoughts, and hallucinations.   Patient informed Clinical research associate that he struggles with ADHD.  He notes that he was given Adderall by his primary care doctor but notes that he was not taking it for ADHD.  He informed Clinical research associate that he  was followed by neuropsych in the past.  Provider requested these records.   Patient currently resides in an [place] home.  He denies marijuana use, alcohol use, or other illegal drug use since his discharge from the hospital.  Patient requested a benzodiazepine to help manage anxiety or Adderall to help manage his ADHD.  At this time provider informed patient that those medications would not be prescribed.  Provider recommended BuSpar for anxiety and depression however patient was not agreeable.  Patient informed writer that he found hydroxyzine and Zyprexa ineffective.  At this time he does not wish to restart these medications.  Cymbalta 60 mg increased to 80 mg daily to help manage anxiety depression.  Patient does endorse auditory hallucination but at this time which is thought to start another antipsychotic.  Patient endorses symptoms of ADHD.  He requested to restart Adderall.  He notes that he was receiving Adderall from his PCP but not for ADHD.  He notes that he was seen by neuropsych.  Provider requested these documentations.  He endorsed understanding and notes that he would get into the clinic.  Provider also informed patient that if Adderall were restarted a urine drug screen would be needed.  He endorsed understanding.  Associated Signs/Symptoms: Depression Symptoms:  depressed mood, anhedonia, psychomotor agitation, fatigue, feelings of worthlessness/guilt, difficulty concentrating, impaired memory, anxiety, (Hypo) Manic Symptoms:  Distractibility, Flight of Ideas, Hallucinations, Anxiety Symptoms:  Excessive Worry, Psychotic Symptoms:  Hallucinations: Auditory Paranoia, PTSD Symptoms: Had a traumatic exposure:  Notes that his father passed away in April 29, 2019  Past  Psychiatric History: MDD, ADHD, homelessness and schizophrenia   Previous Psychotropic Medications:  adderall, bupropion, Prozac, lithium, Zoloft Cymbalta, hydroxyzine,  Substance Abuse History in the last 12 months:   Yes.    Consequences of Substance Abuse: NA  Past Medical History:  Past Medical History:  Diagnosis Date   Schizophrenia [facility])     Past Surgical History:  Procedure Laterality Date   APPENDECTOMY      Family Psychiatric History: Denies  Family History: No family history on file.  Social History:   Social History   Socioeconomic History   Marital status: Single    Spouse name: Not on file   Number of children: Not on file   Years of education: Not on file   Highest education level: Not on file  Occupational History   Not on file  Tobacco Use   Smoking status: Former    Current packs/day: 0.50    Types: Cigarettes   Smokeless tobacco: Never  Vaping Use   Vaping status: Never Used  Substance and Sexual Activity   Alcohol use: Not Currently    Alcohol/week: 5.0 standard drinks of alcohol    Types: 5 Cans of beer per week   Drug use: No   Sexual activity: Not on file  Other Topics Concern   Not on file  Social History Narrative   Not on file   Social Determinants of Health   Financial Resource Strain: Not on file  Food Insecurity: No Food Insecurity (02/25/2023)   Hunger Vital Sign    Worried About Running Out of Food in the Last Year: Never true    Ran Out of Food in the Last Year: Never true  Recent Concern: Food Insecurity - Food Insecurity Present (02/24/2023)   Hunger Vital Sign    Worried About Running Out of Food in the Last Year: Often true    Ran Out of Food in the Last Year: Sometimes true  Transportation Needs: No Transportation Needs (02/25/2023)   PRAPARE - Administrator, Civil Service (Medical): No    Lack of Transportation (Non-Medical): No  Physical Activity: Not on file  Stress: Not on file  Social Connections: Not on file    Additional Social History: Patient resides in an [place] house. He is single and has no children. He works at [employer]. Patient notes that he smokes one pack of cigarette a day. Patient notes that it  has been less than a year since his last drink  Allergies:  No Known Allergies  Metabolic Disorder Labs: Lab Results  Component Value Date   HGBA1C 5.0 02/13/2023   MPG 96.8 02/13/2023   No results found for: "PROLACTIN" Lab Results  Component Value Date   CHOL 150 02/13/2023   TRIG 100 02/13/2023   HDL 53 02/13/2023   CHOLHDL 2.8 02/13/2023   VLDL 20 02/13/2023   LDLCALC 77 02/13/2023   Lab Results  Component Value Date   TSH 0.426 02/13/2023    Therapeutic Level Labs: No results found for: "LITHIUM" No results found for: "CBMZ" No results found for: "VALPROATE"  Current Medications: Current Outpatient Medications  Medication Sig Dispense Refill   DULoxetine 40 MG CPEP Take 2 capsules (80 mg total) by mouth 2 (two) times daily. 60 capsule 3   lisinopril (ZESTRIL) 5 MG tablet Take 1 tablet (5 mg total) by mouth at bedtime. 30 tablet 0   No current facility-administered medications for this visit.    Musculoskeletal: Strength & Muscle Tone: within  normal limits and Telehealt visit Gait & Station: normal, Telehealt visit Patient leans: N/A  Psychiatric Specialty Exam: Review of Systems  There were no vitals taken for this visit.There is no height or weight on file to calculate BMI.  General Appearance: Well Groomed  Eye Contact:  Good  Speech:  Clear and Coherent and Normal Rate  Volume:  Normal  Mood:  Anxious and Depressed  Affect:  Appropriate and Congruent  Thought Process:  Coherent, Goal Directed, and Linear  Orientation:  Full (Time, Place, and Person)  Thought Content:  WDL and Logical  Suicidal Thoughts:  No  Homicidal Thoughts:  No  Memory:  Immediate;   Good Recent;   Fair Remote;   Good  Judgement:  Good  Insight:  Good  Psychomotor Activity:  Normal  Concentration:  Concentration: Fair and Attention Span: Fair  Recall:  Good  Fund of Knowledge:Good  Language: Good  Akathisia:  No  Handed:  Right  AIMS (if indicated):  not done  Assets:   Communication Skills Desire for Improvement Financial Resources/Insurance Housing Leisure Time Physical Health Social Support  ADL's:  Intact  Cognition: WNL  Sleep:  Good   Screenings: AIMS    Flowsheet Row [facility]) [facility]  AIMS Total Score 0 0      AUDIT    Flowsheet Row [facility]) [facility]  Alcohol Use Disorder Identification Test Final Score (AUDIT) 0 2      GAD-7    Flowsheet Row Office Visit from 04/03/2023 in [facility] Counselor from 03/30/2023 in [facility] Office Visit from 09/26/2022 in [facility] [place]  Total GAD-7 Score 20 14 0      PHQ2-9    Flowsheet Row Office Visit from 04/03/2023 in [facility] Counselor from 03/30/2023 in [facility] Video Visit from 03/22/2023 in [facility] [place] Office Visit from 09/26/2022 in [facility] [place] Video Visit from 06/21/2022 in [facility] [place]  PHQ-2 Total Score 5 2 0 0 0  PHQ-9 Total Score 14 9 0 0 0      Flowsheet Row Office Visit from 04/03/2023 in [facility] Counselor from 03/30/2023 in [facility] Ad[facility] (Discharged[facility]  C-SSRS RISK CATEGORY Error: Q3, 4, or 5 should not be populated when Q2 is No No Risk Low Risk       Assessment and Plan: Patient endorses symptoms of anxiety, depression, and psychosis. Patient requested a benzodiazepine to help manage anxiety or  Adderall to help manage his ADHD.  At this time provider informed patient that those medications would not be prescribed.  Provider recommended BuSpar for anxiety and depression however patient was not agreeable.  Patient informed writer that he found hydroxyzine and Zyprexa ineffective.  At this time he does not wish to restart these medications.  Patient informed writer that he found hydroxyzine and Zyprexa ineffective.  At this time he does not wish to restart these medications.  Cymbalta 60 mg increased to 80 mg daily to help manage anxiety depression.  Patient does endorse auditory hallucination but at this time which  is thought to start another antipsychotic.  Patient endorses symptoms of ADHD.  He requested to restart Adderall.  He notes that he was receiving Adderall from his PCP but not for ADHD.  He notes that he was seen by neuropsych.  Provider requested these documentations.  He endorsed understanding and notes that he would get into the clinic.  Provider also informed patient that if Adderall were restarted a urine drug screen would be needed.  He endorsed understanding.  1. Paranoid schizophrenia (HCC)   2. GAD (generalized anxiety disorder)  Increased- DULoxetine 40 MG CPEP; Take 2 capsules (80 mg total) by mouth 2 (two) times daily.  Dispense: 60 capsule; Refill: 3  3. Mild depression  Increased- DULoxetine 40 MG CPEP; Take 2 capsules (80 mg total) by mouth 2 (two) times daily.  Dispense: 60 capsule; Refill: 3     Collaboration of Care: Other provider involved in patient's care AEB PCP  Patient/Guardian was advised Release of Information must be obtained prior to any record release in order to collaborate their care with an outside provider. Patient/Guardian was advised if they have not already done so to contact the registration department to sign all necessary forms in order for [place] to release information regarding their care.   Consent: Patient/Guardian gives verbal consent for  treatment and assignment of benefits for services provided during this visit. Patient/Guardian expressed understanding and agreed to proceed.   Follow-up in 3 months  Shanna Cisco, NP 7/29/20242:47 PM

## 2023-04-12 ENCOUNTER — Telehealth (HOSPITAL_COMMUNITY): Payer: Self-pay

## 2023-04-12 ENCOUNTER — Other Ambulatory Visit (HOSPITAL_COMMUNITY): Payer: Self-pay | Admitting: Psychiatry

## 2023-04-12 DIAGNOSIS — F32A Depression, unspecified: Secondary | ICD-10-CM

## 2023-04-12 DIAGNOSIS — F411 Generalized anxiety disorder: Secondary | ICD-10-CM

## 2023-04-12 MED ORDER — DULOXETINE HCL 40 MG PO CPEP
80.0000 mg | ORAL_CAPSULE | Freq: Two times a day (BID) | ORAL | 1 refills | Status: DC
Start: 2023-04-12 — End: 2023-04-13

## 2023-04-12 NOTE — Telephone Encounter (Signed)
Medication sent to preferred pharmacy

## 2023-04-12 NOTE — Telephone Encounter (Signed)
Patients pharmacy sent a fax, his insurance prefers 90 day supply for the Duloxetine 60 mg  and it is cheaper for the patient. Please review and advise, thank you

## 2023-04-13 ENCOUNTER — Ambulatory Visit (INDEPENDENT_AMBULATORY_CARE_PROVIDER_SITE_OTHER): Payer: No Payment, Other | Admitting: Student

## 2023-04-13 DIAGNOSIS — F411 Generalized anxiety disorder: Secondary | ICD-10-CM | POA: Diagnosis not present

## 2023-04-13 DIAGNOSIS — F32A Depression, unspecified: Secondary | ICD-10-CM | POA: Diagnosis not present

## 2023-04-13 MED ORDER — DULOXETINE HCL 40 MG PO CPEP
80.0000 mg | ORAL_CAPSULE | Freq: Every day | ORAL | 0 refills | Status: DC
Start: 2023-04-13 — End: 2023-10-03

## 2023-04-13 NOTE — Progress Notes (Signed)
BH MD Outpatient Progress Note  04/13/2023 9:17 AM William Romero  MRN:  [id number]  Assessment:  William Romero presents for follow-up evaluation in-person. Today, 04/13/23, patient reports presenting as a walk-in for the purpose of being prescribed Adderall. He reports that he was informed by William Romero that he would be able to get Adderall if he was able to have ROI from PCP proving he was on Adderall. I reviewed note by William Romero that indicated patient would need neuropsych testing or proof of neuropsych testing. He was also advised when he went to [facility] to discontinue adderall given his 2 admissions in June 2024 for psychosis. Unclear if psychosis is organic or substance induced but appears more likely to be substance induced from adderall and/or cannabis (given multiple ED visits for hallucinations this past year). In addition, there was concern for diversion given UDS negative for amphetamines past few [facility] visits despite him continuing to fill prescription. Advised patient to not take adderall as it puts him at risk of worsening paranoia and hallucinations. He verbalized understanding but was understandably frustrated. Offered alternative non-stimulant prescription options such as clonidine and atomoxetine but he was uninterested. He did not want any psychotropic adjustments and would be continuing on regiment of duloxetine. Patient has follow up with William Romero 05/24/23 for further medication management. Discussed getting him on medicaid but he states he will likely get insurance through work soon so will do that instead.   Called pharmacy to correct duloxetine dose as it was prescribed as 80 mg twice a day.   Identifying Information: William Romero is a 42 y.o. male with a history of MDD, ADHD, cannabis use disorder and substance induced psychosis who is an established patient with [facility] for medication management.   Plan:  # Hx of  ADHD Past medication trials: adderall (possibly caused paranoia and psychosis), ritalin,  Interventions: -- Neuropsych testing (patient refused referral as he wants to get insurance due to cost) -- Recommend non-stimulant options in future  # MDD Past medication trials:  Status of problem: early remission Interventions: -- continue duloxetine 80 mg daily for depression  # Cannabis Use Disorder Past medication trials:  Status of problem: initial remission Interventions: -- recommend continued cesstion  Return to care in 5 weeks with William Romero  Patient was given contact information for behavioral health clinic and was instructed to call 911 for emergencies.    Patient and plan of care will be discussed with the Attending MD, William Romero, who agrees with the above statement and plan.   Subjective:  Chief Complaint: Medication Management   Interval History:  Patient presents after seeing NP William Romero approximately 1 week ago.  He reports that she had told him if he could provide ROI by PCP that he could be put back on Adderall.  I reviewed the notes and discussed that this is not what was said and there was more that he needed neuropsych testing for ADHD.  He stated that he did have neuropsych testing when he was 10 but has no records of this.  I advised him that he would need to do it again and that even if he did, it does not seem like he would be a good candidate for Adderall because he has had significant history of psychosis and irritability that could be exacerbated with Adderall.  He verbalized understanding but reports frustration that he could not be placed back on Adderall.  I empathized with them but stated  that it would be unsafe and likely cause him more harm than benefit.  He asked if I be able to at least prescribe him 1 month so that he could "give it a try". I stated this would be extremely unsafe and would put him at risk of being hospitalized again which I would not  want for him.  He reports that he is currently only taking duloxetine and that has been beneficial for his depression.  He does report current anxiety but primarily states that it is frustration that he cannot even put back on Adderall.  He states that he will seek one of his prior psychiatrist prescribed him Adderall.  I again stated that he should not be on Adderall given his recent psychiatric admissions that have appeared to have been substance-induced.  He verbalized understanding.  He denies SI/HI/AVH.  I offered medications such as hydroxyzine or gabapentin for his anxiety but he did not feel those are beneficial.  He also denied feeling Wellbutrin helped him with his focus and depression so he did not want to consider being restarted on this.  I also offered nonstimulant based medication such as atomoxetine and clonidine but he did not want to be started on these medications.  He currently is residing in [place] house and is currently working and plans to get insurance through his work.  He does report satisfaction through his job and I encouraged him to continue with it as he appears significantly less depressed than when I first saw him when he was [facility] approximately 1 month ago.  All questions were addressed. Visit Diagnosis:    ICD-10-CM   1. GAD (generalized anxiety disorder)  F41.1 DULoxetine HCl 40 MG CPEP    2. Mild depression  F32.A DULoxetine HCl 40 MG CPEP      Past Psychiatric History:  Diagnoses: GAD, MDD, Substance Induced Psychosis Medication trials: several antipsychotics (self-discontinued), adderall, hydroxyzine, gabapentin Previous psychiatrist/therapist: Toy Romero Hospitalizations: several, recently 2 in 02/2023 Suicide attempts: denies SIB: denies Hx of violence towards others: denies Current access to guns: denies Hx of trauma/abuse: denies Substance use: cannabis  Past Medical History:  Past Medical History:  Diagnosis Date    Schizophrenia (HCC)     Past Surgical History:  Procedure Laterality Date   APPENDECTOMY      Family Psychiatric History: denies  Social History:  Academic/Vocational: currently works and resides in [employer] Social History   Socioeconomic History   Marital status: Single    Spouse name: Not on file   Number of children: Not on file   Years of education: Not on file   Highest education level: Not on file  Occupational History   Not on file  Tobacco Use   Smoking status: Former    Current packs/day: 0.50    Types: Cigarettes   Smokeless tobacco: Never  Vaping Use   Vaping status: Never Used  Substance and Sexual Activity   Alcohol use: Not Currently    Alcohol/week: 5.0 standard drinks of alcohol    Types: 5 Cans of beer per week   Drug use: No   Sexual activity: Not on file  Other Topics Concern   Not on file  Social History Narrative   Not on file   Social Determinants of Health   Financial Resource Strain: Not on file  Food Insecurity: No Food Insecurity (02/25/2023)   Hunger Vital Sign    Worried About Running Out of Food in the Last Year:  Never true    Ran Out of Food in the Last Year: Never true  Recent Concern: Food Insecurity - Food Insecurity Present (02/24/2023)   Hunger Vital Sign    Worried About Running Out of Food in the Last Year: Often true    Ran Out of Food in the Last Year: Sometimes true  Transportation Needs: No Transportation Needs (02/25/2023)   PRAPARE - Administrator, Civil Service (Medical): No    Lack of Transportation (Non-Medical): No  Physical Activity: Not on file  Stress: Not on file  Social Connections: Not on file    Allergies:  No Known Allergies  Current Medications: Current Outpatient Medications  Medication Sig Dispense Refill   DULoxetine HCl 40 MG CPEP Take 2 capsules (80 mg total) by mouth daily. 180 capsule 0   lisinopril (ZESTRIL) 5 MG tablet Take 1 tablet (5 mg total) by mouth at bedtime. 30  tablet 0   No current facility-administered medications for this visit.    ROS: Review of Systems   Objective:  Psychiatric Specialty Exam: There were no vitals taken for this visit.There is no height or weight on file to calculate BMI.  General Appearance: Casual  Eye Contact: Fair  Speech:  Clear and Coherent and Normal Rate  Volume:  Normal  Mood:  Euthymic  Affect:  Appropriate and Congruent  Thought Content: Logical   Suicidal Thoughts:  No  Homicidal Thoughts:  No  Thought Process:  Coherent, Goal Directed, and Linear  Orientation:  Full (Time, Place, and Person)    Memory: Remote;   Good  Judgment:  Fair  Insight:  Shallow  Concentration:  Attention Span: Fair  Recall: not formally assessed   Fund of Knowledge: Fair  Language: Fair  Psychomotor Activity:  Normal  Akathisia:  No  AIMS (if indicated): not done  Assets:  Communication Skills Desire for Improvement Housing Intimacy Leisure Time Physical Health Resilience Social Support Talents/Skills Transportation Vocational/Educational  ADL's:  Intact  Cognition: WNL  Sleep:  Good   PE: General: well-appearing; no acute distress  Pulm: no increased work of breathing on room air  Strength & Muscle Tone: within normal limits Neuro: no focal neurological deficits observed  Gait & Station: normal  Metabolic Disorder Labs: Lab Results  Component Value Date   HGBA1C 5.0 02/13/2023   MPG 96.8 02/13/2023   No results found for: "PROLACTIN" Lab Results  Component Value Date   CHOL 150 02/13/2023   TRIG 100 02/13/2023   HDL 53 02/13/2023   CHOLHDL 2.8 02/13/2023   VLDL 20 02/13/2023   LDLCALC 77 02/13/2023   Lab Results  Component Value Date   TSH 0.426 02/13/2023   TSH 1.73 10/18/2018    Therapeutic Level Labs: No results found for: "LITHIUM" No results found for: "VALPROATE" No results found for: "CBMZ"  Screenings: AIMS    Flowsheet Row [facility]) [facility]  AIMS Total Score 0 0      AUDIT    Flowsheet Row [facility]) [facility] ([facility]  Alcohol Use Disorder Identification Test Final Score (AUDIT) 0 2      GAD-7    Flowsheet Row Office Visit from 04/03/2023 in [facility] Counselor from 03/30/2023 in [facility] Office Visit  from 09/26/2022 in [facility] [place]  Total GAD-7 Score 20 14 0      PHQ2-9    Flowsheet Row Office Visit from 04/03/2023 in [facility] Counselor from 03/30/2023 in [facility] Video Visit from 03/22/2023 in [facility] [place] Office Visit from 09/26/2022 in [facility] [place] Video Visit from 06/21/2022 in [facility] Total Score 5 2 0 0 0  PHQ-9 Total Score 14 9 0 0 0      Flowsheet Row Office Visit from 04/03/2023 in [facility] Counselor from 03/30/2023 in [facility] (Discharged[facility]  C-SSRS RISK CATEGORY Error: Q3, 4, or 5 should not be populated when Q2 is No No Risk Low Risk       Collaboration of Care: Collaboration of Care:  Patient/Guardian was advised Release of Information must be obtained prior to any record release in order to collaborate their care with an outside provider. Patient/Guardian was advised if they have not already done so to contact the registration department to sign all necessary forms in order for [place] to release information regarding their care.   Consent:  Patient/Guardian gives verbal consent for treatment and assignment of benefits for services provided during this visit. Patient/Guardian expressed understanding and agreed to proceed.   Park Pope, MD 04/13/2023, 9:17 AM

## 2023-04-14 NOTE — Addendum Note (Signed)
Addended by: Theodoro Kos A on: 04/14/2023 03:50 PM   Modules accepted: Level of Service

## 2023-04-20 ENCOUNTER — Ambulatory Visit (INDEPENDENT_AMBULATORY_CARE_PROVIDER_SITE_OTHER): Payer: No Payment, Other | Admitting: Licensed Clinical Social Worker

## 2023-04-20 DIAGNOSIS — F32A Depression, unspecified: Secondary | ICD-10-CM

## 2023-04-20 DIAGNOSIS — F2081 Schizophreniform disorder: Secondary | ICD-10-CM

## 2023-04-20 DIAGNOSIS — F411 Generalized anxiety disorder: Secondary | ICD-10-CM
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
## 2023-05-19 ENCOUNTER — Ambulatory Visit (HOSPITAL_COMMUNITY): Payer: No Typology Code available for payment source | Admitting: Licensed Clinical Social Worker

## 2023-05-24 ENCOUNTER — Ambulatory Visit (HOSPITAL_COMMUNITY): Payer: No Typology Code available for payment source | Admitting: Student

## 2023-10-03 ENCOUNTER — Encounter (HOSPITAL_COMMUNITY): Payer: Self-pay | Admitting: Psychiatry

## 2023-10-03 ENCOUNTER — Inpatient Hospital Stay (HOSPITAL_COMMUNITY)
Admission: AD | Admit: 2023-10-03 | Discharge: 2023-10-12 | DRG: 885 | Disposition: A | Discharge status: Home / Self Care | Payer: No Typology Code available for payment source | Source: Intra-hospital | Attending: Psychiatry | Admitting: Psychiatry

## 2023-10-03 ENCOUNTER — Ambulatory Visit (HOSPITAL_COMMUNITY): Admission: EM | Admit: 2023-10-03 | Discharge: 2023-10-03 | Payer: No Payment, Other

## 2023-10-03 ENCOUNTER — Other Ambulatory Visit: Payer: Self-pay

## 2023-10-03 DIAGNOSIS — F17210 Nicotine dependence, cigarettes, uncomplicated: Secondary | ICD-10-CM | POA: Diagnosis present

## 2023-10-03 DIAGNOSIS — F259 Schizoaffective disorder, unspecified: Principal | ICD-10-CM | POA: Insufficient documentation

## 2023-10-03 DIAGNOSIS — F411 Generalized anxiety disorder: Secondary | ICD-10-CM | POA: Diagnosis present

## 2023-10-03 DIAGNOSIS — F1210 Cannabis abuse, uncomplicated: Secondary | ICD-10-CM | POA: Diagnosis present

## 2023-10-03 DIAGNOSIS — F200 Paranoid schizophrenia: Secondary | ICD-10-CM | POA: Diagnosis not present

## 2023-10-03 DIAGNOSIS — G2571 Drug induced akathisia: Secondary | ICD-10-CM | POA: Diagnosis present

## 2023-10-03 DIAGNOSIS — Z5902 Unsheltered homelessness: Secondary | ICD-10-CM | POA: Diagnosis not present

## 2023-10-03 DIAGNOSIS — I10 Essential (primary) hypertension: Secondary | ICD-10-CM | POA: Diagnosis present

## 2023-10-03 DIAGNOSIS — Z79899 Other long term (current) drug therapy: Secondary | ICD-10-CM

## 2023-10-03 DIAGNOSIS — R45851 Suicidal ideations: Secondary | ICD-10-CM | POA: Diagnosis present

## 2023-10-03 DIAGNOSIS — E876 Hypokalemia: Secondary | ICD-10-CM | POA: Diagnosis present

## 2023-10-03 DIAGNOSIS — F251 Schizoaffective disorder, depressive type: Secondary | ICD-10-CM | POA: Diagnosis not present

## 2023-10-03 DIAGNOSIS — F250 Schizoaffective disorder, bipolar type: Secondary | ICD-10-CM | POA: Diagnosis present

## 2023-10-03 HISTORY — DX: Paranoid schizophrenia: F20.0

## 2023-10-03 LAB — URINALYSIS, ROUTINE W REFLEX MICROSCOPIC
Bilirubin Urine: NEGATIVE
Glucose, UA: NEGATIVE mg/dL
Hgb urine dipstick: NEGATIVE
Ketones, ur: NEGATIVE mg/dL
Leukocytes,Ua: NEGATIVE
Nitrite: NEGATIVE
Protein, ur: NEGATIVE mg/dL
Specific Gravity, Urine: 1.025 (ref 1.005–1.030)
pH: 5 (ref 5.0–8.0)

## 2023-10-03 LAB — CBC WITH DIFFERENTIAL/PLATELET
Abs Immature Granulocytes: 0.01 10*3/uL (ref 0.00–0.07)
Basophils Absolute: 0.1 10*3/uL (ref 0.0–0.1)
Basophils Relative: 1 %
Eosinophils Absolute: 0.1 10*3/uL (ref 0.0–0.5)
Eosinophils Relative: 1 %
HCT: 41.7 % (ref 39.0–52.0)
Hemoglobin: 14.4 g/dL (ref 13.0–17.0)
Immature Granulocytes: 0 %
Lymphocytes Relative: 17 %
Lymphs Abs: 1 10*3/uL (ref 0.7–4.0)
MCH: 29.9 pg (ref 26.0–34.0)
MCHC: 34.5 g/dL (ref 30.0–36.0)
MCV: 86.7 fL (ref 80.0–100.0)
Monocytes Absolute: 0.6 10*3/uL (ref 0.1–1.0)
Monocytes Relative: 11 %
Neutro Abs: 4 10*3/uL (ref 1.7–7.7)
Neutrophils Relative %: 70 %
Platelets: 361 10*3/uL (ref 150–400)
RBC: 4.81 MIL/uL (ref 4.22–5.81)
RDW: 12.9 % (ref 11.5–15.5)
WBC: 5.8 10*3/uL (ref 4.0–10.5)
nRBC: 0 % (ref 0.0–0.2)

## 2023-10-03 LAB — COMPREHENSIVE METABOLIC PANEL
ALT: 20 U/L (ref 0–44)
AST: 21 U/L (ref 15–41)
Albumin: 3.6 g/dL (ref 3.5–5.0)
Alkaline Phosphatase: 83 U/L (ref 38–126)
Anion gap: 7 (ref 5–15)
BUN: 8 mg/dL (ref 6–20)
CO2: 27 mmol/L (ref 22–32)
Calcium: 9 mg/dL (ref 8.9–10.3)
Chloride: 105 mmol/L (ref 98–111)
Creatinine, Ser: 1.14 mg/dL (ref 0.61–1.24)
GFR, Estimated: >60 mL/min (ref >60)
Glucose, Bld: 94 mg/dL (ref 70–99)
Potassium: 3.3 mmol/L — ABNORMAL LOW (ref 3.5–5.1)
Sodium: 139 mmol/L (ref 135–145)
Total Bilirubin: 0.6 mg/dL (ref 0.0–1.2)
Total Protein: 7.6 g/dL (ref 6.5–8.1)

## 2023-10-03 LAB — POCT URINE DRUG SCREEN - MANUAL ENTRY (I-SCREEN)
POC Amphetamine UR: POSITIVE — AB
POC Buprenorphine (BUP): NOT DETECTED
POC Cocaine UR: NOT DETECTED
POC Marijuana UR: POSITIVE — AB
POC Methadone UR: NOT DETECTED
POC Methamphetamine UR: NOT DETECTED
POC Morphine: NOT DETECTED
POC Oxazepam (BZO): NOT DETECTED
POC Oxycodone UR: NOT DETECTED
POC Secobarbital (BAR): NOT DETECTED

## 2023-10-03 LAB — LIPID PANEL
Cholesterol: 157 mg/dL (ref 0–200)
HDL: 44 mg/dL (ref >40)
LDL Cholesterol: 98 mg/dL (ref 0–99)
Total CHOL/HDL Ratio: 3.6 {ratio}
Triglycerides: 76 mg/dL (ref <150)
VLDL: 15 mg/dL (ref 0–40)

## 2023-10-03 LAB — TSH: TSH: 0.641 u[IU]/mL (ref 0.350–4.500)

## 2023-10-03 LAB — HEMOGLOBIN A1C
Hgb A1c MFr Bld: 5.1 % (ref 4.8–5.6)
Mean Plasma Glucose: 99.67 mg/dL

## 2023-10-03 LAB — ETHANOL: Alcohol, Ethyl (B): <10 mg/dL (ref <10)

## 2023-10-03 LAB — MAGNESIUM: Magnesium: 2.1 mg/dL (ref 1.7–2.4)

## 2023-10-03 MED ORDER — HALOPERIDOL LACTATE 5 MG/ML IJ SOLN: 5.0000 mg | Freq: Three times a day (TID) | INTRAMUSCULAR | Status: DC | PRN

## 2023-10-03 MED ORDER — DIPHENHYDRAMINE HCL 50 MG/ML IJ SOLN: 50.0000 mg | Freq: Three times a day (TID) | INTRAMUSCULAR | Status: DC | PRN

## 2023-10-03 MED ORDER — ALUM & MAG HYDROXIDE-SIMETH 200-200-20 MG/5ML PO SUSP: 30.0000 mL | ORAL | Status: DC | PRN

## 2023-10-03 MED ORDER — TRAZODONE HCL 50 MG PO TABS
50.0000 mg | ORAL_TABLET | Freq: Every evening | ORAL | Status: DC | PRN
  Administered 2023-10-03 – 2023-10-09 (×7): 50 mg via ORAL
  Filled 2023-10-03 (×7): qty 1

## 2023-10-03 MED ORDER — DIPHENHYDRAMINE HCL 50 MG PO CAPS: 50.0000 mg | ORAL_CAPSULE | Freq: Three times a day (TID) | ORAL | Status: DC | PRN

## 2023-10-03 MED ORDER — HALOPERIDOL 5 MG PO TABS: 5.0000 mg | ORAL_TABLET | Freq: Three times a day (TID) | ORAL | Status: DC | PRN

## 2023-10-03 MED ORDER — RISPERIDONE 0.5 MG PO TABS: 0.5000 mg | ORAL_TABLET | Freq: Two times a day (BID) | ORAL | Status: DC

## 2023-10-03 MED ORDER — HYDROXYZINE HCL 50 MG PO TABS
50.0000 mg | ORAL_TABLET | Freq: Three times a day (TID) | ORAL | Status: DC | PRN
  Administered 2023-10-03 – 2023-10-12 (×20): 50 mg via ORAL
  Filled 2023-10-03 (×10): qty 1
  Filled 2023-10-03: qty 15
  Filled 2023-10-03 (×12): qty 1

## 2023-10-03 MED ORDER — LORAZEPAM 2 MG/ML IJ SOLN: 2.0000 mg | Freq: Three times a day (TID) | INTRAMUSCULAR | Status: DC | PRN

## 2023-10-03 MED ORDER — MAGNESIUM HYDROXIDE 400 MG/5ML PO SUSP: 30.0000 mL | Freq: Every day | ORAL | Status: DC | PRN

## 2023-10-03 MED ORDER — RISPERIDONE 0.5 MG PO TABS
0.5000 mg | ORAL_TABLET | Freq: Two times a day (BID) | ORAL | Status: DC
  Administered 2023-10-03: 0.5 mg via ORAL
  Filled 2023-10-03: qty 1

## 2023-10-03 MED ORDER — DIPHENHYDRAMINE HCL 25 MG PO CAPS
50.0000 mg | ORAL_CAPSULE | Freq: Three times a day (TID) | ORAL | Status: DC | PRN
  Administered 2023-10-04 – 2023-10-05 (×2): 50 mg via ORAL
  Filled 2023-10-03 (×2): qty 2

## 2023-10-03 MED ORDER — ACETAMINOPHEN 325 MG PO TABS: 650.0000 mg | ORAL_TABLET | Freq: Four times a day (QID) | ORAL | Status: DC | PRN

## 2023-10-03 MED ORDER — HALOPERIDOL LACTATE 5 MG/ML IJ SOLN: 10.0000 mg | Freq: Three times a day (TID) | INTRAMUSCULAR | Status: DC | PRN

## 2023-10-03 MED ORDER — RISPERIDONE 0.5 MG PO TABS
0.5000 mg | ORAL_TABLET | Freq: Two times a day (BID) | ORAL | Status: DC
  Administered 2023-10-03 – 2023-10-04 (×2): 0.5 mg via ORAL
  Filled 2023-10-03 (×8): qty 1

## 2023-10-03 MED ORDER — ACETAMINOPHEN 325 MG PO TABS
650.0000 mg | ORAL_TABLET | Freq: Four times a day (QID) | ORAL | Status: DC | PRN
  Administered 2023-10-09 – 2023-10-10 (×2): 650 mg via ORAL
  Filled 2023-10-03 (×2): qty 2

## 2023-10-03 MED ORDER — HALOPERIDOL 5 MG PO TABS
5.0000 mg | ORAL_TABLET | Freq: Three times a day (TID) | ORAL | Status: DC | PRN
  Administered 2023-10-04 – 2023-10-05 (×2): 5 mg via ORAL
  Filled 2023-10-03 (×3): qty 1

## 2023-10-03 MED ORDER — TRAZODONE HCL 50 MG PO TABS: 50.0000 mg | ORAL_TABLET | Freq: Every evening | ORAL | Status: DC | PRN

## 2023-10-03 MED ORDER — DIPHENHYDRAMINE HCL 50 MG/ML IJ SOLN
50.0000 mg | Freq: Three times a day (TID) | INTRAMUSCULAR | Status: DC | PRN
Start: 2023-10-03 — End: 2023-10-12

## 2023-10-03 NOTE — ED Notes (Signed)
Safe transport here to transport to [facility]. Pt a/o & ambulatory. Safety maintained.

## 2023-10-03 NOTE — ED Notes (Signed)
Patient A&O. Patient presents with paranoid ideation about family wanting to impose themselves into his life. Patent states he also hears voices saying bad and violent thing which caused him to lose his job yesterday r/t lack of focus. Patients speech is tangential, rapid and pressured. Patient endorses he is unable to distinguish if voices are in his head or if family is attempting to control him from afar. Patient denies SI, HI, VH. Patient does not currently appear to be responding to internal stimuli.

## 2023-10-03 NOTE — Tx Team (Signed)
Initial Treatment Plan 10/03/2023 6:19 PM William Romero [id number]    PATIENT STRESSORS: Health problems     PATIENT STRENGTHS: Ability for insight    PATIENT IDENTIFIED PROBLEMS: Command auditory hallucination  DISCHARGE CRITERIA:  Improved stabilization in mood, thinking, and/or behavior Motivation to continue treatment in a less acute level of care Reduction of life-threatening or endangering symptoms to within safe limits  PRELIMINARY DISCHARGE PLAN: Attend PHP/IOP Outpatient therapy Return to previous living arrangement  PATIENT/FAMILY INVOLVEMENT: This treatment plan has been presented to and reviewed with the patient, William Romero, The patient and family have been given the opportunity to ask questions and make suggestions.  Annitta Jersey, RN 10/03/2023, 6:19 PM

## 2023-10-03 NOTE — Plan of Care (Signed)
  Problem: Education: Goal: Knowledge of [facility] Education information/materials will improve Outcome: Progressing Goal: Emotional status will improve Outcome: Progressing Goal: Mental status will improve Outcome: Progressing Goal: Verbalization of understanding the information provided will improve Outcome: Progressing   Problem: Activity: Goal: Interest or engagement in activities will improve Outcome: Progressing Goal: Sleeping patterns will improve Outcome: Progressing   Problem: Coping: Goal: Ability to verbalize frustrations and anger appropriately will improve Outcome: Progressing Goal: Ability to demonstrate self-control will improve Outcome: Progressing   Problem: Physical Regulation: Goal: Ability to maintain clinical measurements within normal limits will improve Outcome: Progressing   Problem: Health Behavior/Discharge Planning: Goal: Identification of resources available to assist in meeting health care needs will improve Outcome: Progressing Goal: Compliance with treatment plan for underlying cause of condition will improve Outcome: Progressing   Problem: Physical Regulation: Goal: Ability to maintain clinical measurements within normal limits will improve Outcome: Progressing   Problem: Safety: Goal: Periods of time without injury will increase Outcome: Progressing
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
## 2023-10-03 NOTE — ED Notes (Signed)
Patient d/c to [facility] in stable condition with all belongings via [employer]. Patient denies SI, HI, VH. Patient does not appear to be responding to internal stimuli.
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
## 2023-10-03 NOTE — ED Provider Notes (Cosign Needed Addendum)
[facility] Urgent Care Continuous Assessment  Medical Screening Exam  Date: 10/03/23 Patient Name: William Romero MRN: [id number] Chief Complaint: "I can't stop these voices, they are distracting me"  Diagnoses:  Final diagnoses:  None    HPI: William Romero is a 43 yo male with past psychiatric hx significant for Schizophrenia, MDD, ADHD and possible alcohol use disorder who presents today to the [facility] after the worsening of his hallucinations has made it difficult for him to complete activities of daily living. 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 He lost his job yesterday. He has been living out of his car for some time, but he felt that yesterday's incident at work makes it difficult for him to pretend that the hallucinations (he does not call them hallucinations, he calls them "voices") are something he needs to deal with.  Patient also reports some strange patterns of behavior with regards to his family life.  He reports a pattern of daily imagining how his life would be different if he had a family to the point where he recognizes that it is a fantasy, but it sometimes almost seems real to him.  He likes to imagine what life would be like if he had made different choices and ended up in a different place.  Patient does not have other hobbies.  Patient also reports ideas of reference, he has had persistent beliefs that he has seen things that belong to him that has been "taken away by my family and ended up in other people's houses" pressed for an example he is unable to give one, but he seems to recognize that these are not actual thefts of physical property but he believes his life should be different in some way and that there are signals left by his family about his failures.  He reports no medication that has ever taken has made the hallucinations stopped or noticeably change, the only medication is ever helped him his Adderall.  Patient  reports that he has been using marijuana over the counter preparations last use yesterday reports a little alcohol history of last drink was a week ago reports that this is not a major thing for him.   Total Time spent with patient: 45 minutes  Musculoskeletal  Strength & Muscle Tone: within normal limits Gait & Station: normal Patient leans: N/A  Psychiatric Specialty Exam  Presentation General Appearance:  Appropriate for Environment; Casual; Fairly Groomed  Eye Contact: Fair  Speech: Clear and Coherent; Normal Rate  Speech Volume: Normal  Handedness: Right   Mood and Affect  Mood: Depressed; Anxious  Affect: Congruent   Thought Process  Thought Processes: Coherent; Goal Directed; Linear  Descriptions of Associations:Intact  Orientation:Full (Time, Place and Person)  Thought Content:Delusions; Paranoid Ideation (Paranoid delusions that his family are trying to manipulate him from afar. Believes that they are having nice stable lives as a way to attack him.)  Diagnosis of Schizophrenia or Schizoaffective disorder in past: Yes  Duration of Psychotic Symptoms: Greater than six months  Hallucinations:Hallucinations: Auditory Description of Auditory Hallucinations: Voices of family members not present who are saying scary things and encouraging violence. Feels that they are trying to insinuate themselves in his life.  Ideas of Reference:Paranoia  Suicidal Thoughts:Suicidal Thoughts: No  Homicidal Thoughts:Homicidal Thoughts: No   Sensorium  Memory: Immediate Fair; Recent Fair; Remote Good  Judgment: Good  Insight: Fair   Art therapist  Concentration: Fair  Attention Span:  Fair  Recall: [place] Major of Knowledge: Fair  Language: Good   Psychomotor Activity  Psychomotor Activity: Psychomotor Activity: Normal   Assets  Assets: Communication Skills; Desire for Improvement   Sleep  Sleep: Sleep: Fair   Nutritional  Assessment (For OBS and FBC admissions only) Has the patient had a weight loss or gain of 10 pounds or more in the last 3 months?: No Has the patient had a decrease in food intake/or appetite?: No Does the patient have dental problems?: No Does the patient have eating habits or behaviors that may be indicators of an eating disorder including binging or inducing vomiting?: No Has the patient recently lost weight without trying?: 0 Has the patient been eating poorly because of a decreased appetite?: 0 Malnutrition Screening Tool Score: 0    Physical Exam Vitals and nursing note reviewed.  Constitutional:      General: He is not in acute distress.    Appearance: Normal appearance. He is normal weight. He is not ill-appearing.  HENT:     Head: Normocephalic and atraumatic.  Pulmonary:     Effort: Pulmonary effort is normal.  Skin:    General: Skin is warm and dry.  Neurological:     Mental Status: He is alert and oriented to person, place, and time.  Psychiatric:        Attention and Perception: Attention normal. He perceives auditory hallucinations.        Mood and Affect: Mood is anxious, depressed and elated.        Speech: Speech is rapid and pressured and tangential.        Behavior: Behavior normal. Behavior is cooperative.        Thought Content: Thought content is paranoid and delusional. Thought content does not include homicidal or suicidal ideation. Thought content does not include homicidal or suicidal plan.        Cognition and Memory: Cognition and memory normal.        Judgment: Judgment normal.    Review of Systems  Constitutional:  Negative for chills, fever, malaise/fatigue and weight loss.  Respiratory:  Negative for cough.   Cardiovascular:  Negative for chest pain.  Gastrointestinal:  Negative for abdominal pain and vomiting.  Neurological:  Negative for headaches.  Psychiatric/Behavioral:  Positive for depression and hallucinations. Negative for substance  abuse and suicidal ideas. The patient is nervous/anxious. The patient does not have insomnia.     Blood pressure (!) 163/97, pulse 99, temperature 98.3 F (36.8 C), temperature source Oral, resp. rate 18, SpO2 98%. There is no height or weight on file to calculate BMI.   Per Dr. Weyman Rodney H&P from 6/11-6/19 [facility] Admission: "Patient reported that he was admitted for "AH", that has been present for 3-4 months. Stated that the voices are 3 different females that are agnostic. These voices are women of people he knows from the past. Denied command AH, rather they "antagonize" this thoughts. He declined to give further details despite re-framing the question multiple different ways. Stated that 2 of the voices are often of his paternal and maternal grandma. Mentioned that he is annoyed with these voices because it interferes with him getting a job. Stated that he feels like the AH are "a liability" because he is worried that the voices (his grandmas) will take the information he learns and give them to others. He believes that his grandmas are antagonizing his for unclear reasons. He mentioned a time last year where his grandma kissed him on  the forehead, he interpreted this as his grandma is comparing him to others because he doesn't have a partner.    Denied any new stressors, substances that could have triggered these AH. When asked about THC + UDS, patient stated that he knows of marijuana and denied using them. Denied having AH like this in the past. Reported that his current stressors are housing instability, where he was unhoused ~83yr ago. He declined to give further information to why. He has been living in his car up until about a few weeks ago, because his car broke down and stopped running. The past week, patient had been living on the street.    Patient reported being on multiple psychotropics in the past, and he perseverated that he doesn't feel like he is depressed. Only current med patient takes  is adderall, which he has been taking consistently for years. Did note having 1/2 of a dozen inpatient psych admission that was a "set up" and he was "provoked" and admitted under IVC between 2012-2019.    Reported AH, thought withdrawal, and paranoia per above.   He denied VH, thought insertion, mind control, powers, and idea of reference.    During current evaluation, patient stated that he last heard the [facility] was this morning. He declined to tell the content.  Reported that his sleep was good because he is not sleeping on the street. Mood is "good", appetite is appropriate. Denied active and passive SI/HI. Denied VH. "   Per Dr Will Bonnet H&P from his August 2024 admission:  Interval history:  Patient reports feeling more paranoid since he left [facility] which he attributes to his antipsychotic.  He states that this paranoia led to him feeling like he wanted to harm himself.  I asked what the association between the 2 were and he is simply stated "I wanted to hurt myself because I was paranoid". He states he is on too much medication and wanted to consolidate it to decrease the frequency and number of medications he was taking. When he discharged from [facility] to [facility], he stated he never went in because "they seemed busy". He states he had gone to [place] [facility] ED due to thinking he had a stroke due to feeling numbness in his left pinky and left shoulder.  He believes that this may have been due to severe anxiety as opposed to truly experiencing a stroke.  He left AMA due to thinking he was about to have a panic attack.  I discussed with him medication adjustments that may very beneficial and allow for him to decrease frequency of when he takes medication and he was agreeable to trying Cymbalta as he has never been on this antidepressant in the past to aid with his depression and anxiety. He endorses passive SI but denies HI/AVH.    Past Psychiatric History:  Diagnoses: MDD, ADHD and schizophrenia Inpatient treatment:  ~6 between 2012-2019 Suicide: Reported 1 attempted overdose in distant past. Homicide: Denied Medication history: adderall, bupropion, duloxetine, haloperidol, hydroxyzine (ineffective), lorazepam, olanzapine, ziprasidone, paliperidone, risperidone,  Medication compliance: Poor, except to adderall   Substance Abuse Hx: Alcohol: intermittent Nicotine:  reports that he has quit smoking. His smoking use included cigarettes. He smoked an average of .5 packs per day. He has never used smokeless tobacco. Marijuana: Yes (daily) IV drug use: Denied Stimulants: Possible stimulant misuse  Opiates: Denied Sedative/hypnotics: Denied Hallucinogens: Denied H/O withdrawals, blackouts: Denied H/O DT: Denied H/O Detox / Rehab: Denied DUI/DWI: Denied  Is the patient at  risk to self? Yes  Has the patient been a risk to self in the past 6 months? Yes .    Has the patient been a risk to self within the distant past? Yes   Is the patient a risk to others? No   Has the patient been a risk to others in the past 6 months? No   Has the patient been a risk to others within the distant past? No   Family History: Patient becomes anxious when discussing his family and says that he does not want to have them in any way involved in his care.  Social History:  Recent psychosocial stressors? Romantic relationship change or loss: NA Family relationship: Poor. Believes family is "plotting against him" asked if he has any specific examples, he said: "of the first cousins, there are 7, I am the only one without a marriage and family." When asked how that was a plot against him, he said "they did it intentionally to keep me isolated and alone and keep me from being happy. This is all part of their plan" Loss of family member or pet: na Financial pressures: Lost job on 1/27. Homeless  Effects of changes: Patient is/socially isolated, with few options for support.  Patient is extremely vulnerable due to this isolation  and his mental health condition.  When asked about who he views this as a possible source of support, he shakes his head and said "no one.  I am alone I do not trust anyone from my family and I do not think they would trust me either."  Patient is homeless, jobless, aimless.  Last Labs:  Admission on 10/03/2023  Component Date Value Ref Range Status   POC Amphetamine UR 10/03/2023 Positive (A)  NONE DETECTED (Cut Off Level 1000 ng/mL) Final   POC Secobarbital (BAR) 10/03/2023 None Detected  NONE DETECTED (Cut Off Level 300 ng/mL) Final   POC Buprenorphine (BUP) 10/03/2023 None Detected  NONE DETECTED (Cut Off Level 10 ng/mL) Final   POC Oxazepam (BZO) 10/03/2023 None Detected  NONE DETECTED (Cut Off Level 300 ng/mL) Final   POC Cocaine UR 10/03/2023 None Detected  NONE DETECTED (Cut Off Level 300 ng/mL) Final   POC Methamphetamine UR 10/03/2023 None Detected  NONE DETECTED (Cut Off Level 1000 ng/mL) Final   POC Morphine 10/03/2023 None Detected  NONE DETECTED (Cut Off Level 300 ng/mL) Final   POC Methadone UR 10/03/2023 None Detected  NONE DETECTED (Cut Off Level 300 ng/mL) Final   POC Oxycodone UR 10/03/2023 None Detected  NONE DETECTED (Cut Off Level 100 ng/mL) Final   POC Marijuana UR 10/03/2023 Positive (A)  NONE DETECTED (Cut Off Level 50 ng/mL) Final    Allergies: Patient has no known allergies.  Medications:  Facility Ordered Medications  Medication   acetaminophen (TYLENOL) tablet 650 mg   alum & mag hydroxide-simeth (MAALOX/MYLANTA) 200-200-20 MG/5ML suspension 30 mL   magnesium hydroxide (MILK OF MAGNESIA) suspension 30 mL   traZODone (DESYREL) tablet 50 mg   risperiDONE (RISPERDAL) tablet 0.5 mg   haloperidol (HALDOL) tablet 5 mg   And   diphenhydrAMINE (BENADRYL) capsule 50 mg   haloperidol lactate (HALDOL) injection 5 mg   And   diphenhydrAMINE (BENADRYL) injection 50 mg   And   LORazepam (ATIVAN) injection 2 mg   haloperidol lactate (HALDOL) injection 10 mg   And    diphenhydrAMINE (BENADRYL) injection 50 mg   And   LORazepam (ATIVAN) injection 2 mg   PTA Medications  Medication Sig   amphetamine-dextroamphetamine (ADDERALL) 20 MG tablet Take 20 mg by mouth 2 (two) times daily.      Medical Decision Making  Patient is his own guardian and is currently voluntary at this time.    Recommendations  Based on my evaluation the patient does not appear to have an emergency medical condition.  Starting patient on Risperidone 0.5 mg BID  Margaretmary Dys, MD 10/03/23  12:52 PM

## 2023-10-03 NOTE — ED Notes (Signed)
Report called to Onalee Hua, RN at [facility]. Safe Transport aware of need for transfer to [facility].

## 2023-10-03 NOTE — Progress Notes (Signed)
Pt has been accepted to [facility] on 10/03/2023 Bed assignment: 404-1  Pt meets inpatient criteria per: Tommy Rainwater MD   Attending Physician will be: Phineas Inches, MD   Report can be called to: Adult unit: [phone]  Pt can arrive after pending labs, EKG, UDS, Vol    Care Team Notified: [facility] Rona Ravens RN, Tommy Rainwater MD, Lorella Nimrod RN, Devinny Harrington NT  [place] Jermany Sundell [name]-A   10/03/2023 3:07 PM

## 2023-10-03 NOTE — [facility] Group Notes (Signed)
[facility] Group Notes:  (Nursing/MHT/Case Management/Adjunct)  Date:  10/03/2023  Time:  2000  Type of Therapy:   wrap up group  Participation Level:  Did Not Attend  Participation Quality:   did not attend  Affect:   did not attend  Cognitive:   did not attend  Insight:  None  Engagement in Group:   did not attend  Modes of Intervention:   did not attend  Summary of Progress/Problems:  William Romero 10/03/2023, 11:18 PM

## 2023-10-03 NOTE — Progress Notes (Signed)
   10/03/23 1746  Charting Type  Charting Type Admission  Safety Check Verification  Has the RN verified the 15 minute safety check completion? Yes  Neurological  Neuro (WDL) WDL  HEENT  HEENT (WDL) WDL  Respiratory  Respiratory (WDL) WDL  Cardiac  Cardiac (WDL) WDL  Vascular  Vascular (WDL) WDL  Integumentary  Integumentary (WDL) WDL  Staff Member Assisting with Skin Assessment on Admission Zaira M,RN  Braden Scale (Ages 8 and up)  Sensory Perceptions 4  Moisture 4  Activity 4  Mobility 4  Nutrition 3  Friction and Shear 3  Braden Scale Score 22  Musculoskeletal  Musculoskeletal (WDL) WDL  Gastrointestinal  Gastrointestinal (WDL) WDL  GU Assessment  Genitourinary (WDL) WDL  Neurological  Level of Consciousness Alert

## 2023-10-03 NOTE — Discharge Instructions (Addendum)
Transfer to [facility]

## 2023-10-04 ENCOUNTER — Encounter (HOSPITAL_COMMUNITY): Payer: Self-pay

## 2023-10-04 ENCOUNTER — Encounter (HOSPITAL_COMMUNITY): Payer: Self-pay | Admitting: Psychiatry

## 2023-10-04 DIAGNOSIS — F251 Schizoaffective disorder, depressive type: Secondary | ICD-10-CM

## 2023-10-04 DIAGNOSIS — F259 Schizoaffective disorder, unspecified: Principal | ICD-10-CM | POA: Insufficient documentation

## 2023-10-04 DIAGNOSIS — F250 Schizoaffective disorder, bipolar type: Principal | ICD-10-CM | POA: Insufficient documentation

## 2023-10-04 MED ORDER — FLUPHENAZINE HCL 2.5 MG PO TABS
2.5000 mg | ORAL_TABLET | Freq: Three times a day (TID) | ORAL | Status: DC
  Administered 2023-10-04 – 2023-10-05 (×3): 2.5 mg via ORAL
  Filled 2023-10-04 (×7): qty 1

## 2023-10-04 NOTE — Progress Notes (Addendum)
Patient presented with anxiety and agitation related to auditory hallucinations following return to unit from lunch. Pt observed pacing and responding to internal stimuli. Pt requests something to "quiet the voices so I can rest". PO Haldol and Benadryl administered per [facility] for agitation and auditory hallucinations. Pt returned to room following medication administration.

## 2023-10-04 NOTE — Group Note (Signed)
Recreation Therapy Group Note   Group Topic:Health and Wellness  Group Date: 10/04/2023 Start Time: 0930 End Time: 1000 Facilitators: Annslee Tercero-McCall, LRT,CTRS Location: 300 Hall Dayroom   Group Topic: Wellness  Goal Area(s) Addresses:  Patient will define components of whole wellness. Patient will verbalize benefit of whole wellness.  Intervention: Music  Activity: Exercise. Patients took turns leading the group in the exercises of their choosing. The group completed three rounds of stretching and exercise. Patients were encouraged to get water, take breaks and not to over extend themselves during the course of the activity. Emphasis was placed on patients paying attention to their bodies and what exercises they are able to complete.   Education: Wellness, Building control surveyor.   Education Outcome: Acknowledges education/In group clarification offered/Needs additional education.   Affect/Mood: N/A   Participation Level: Did not attend    Clinical Observations/Individualized Feedback:      Plan: Continue to engage patient in RT group sessions 2-3x/week.   Dujuan Stankowski-McCall, LRT,CTRS 10/04/2023 12:53 PM

## 2023-10-04 NOTE — Progress Notes (Addendum)
Pt denied SI/HI/VH this morning. Pt endorses AH reporting "I hear voices saying mean words". Pt observed responding to internal stimuli throughout the day. Pt presents with an anxious affect. Pt has been cooperative throughout the shift. Pt given scheduled medications as prescribed. Q15 min checks verified for safety. Patient verbally contracts for safety. Patient compliant with medications and treatment plan. Pt is safe on the unit.   10/04/23 0917  Psych Admission Type (Psych Patients Only)  Admission Status Voluntary  Psychosocial Assessment  Patient Complaints Anxiety;Depression  Eye Contact Fair  Facial Expression Anxious;Animated  Affect Anxious  Speech Press photographer  Appearance/Hygiene Unremarkable  Behavior Characteristics Cooperative;Anxious  Mood Anxious  Thought Process  Coherency WDL  Content WDL  Delusions None reported or observed  Perception Hallucinations  Hallucination Auditory  Judgment Impaired  Confusion None  Danger to Self  Current suicidal ideation? Denies  Description of Suicide Plan No plan  Agreement Not to Harm Self Yes  Description of Agreement Verbal  Danger to Others  Danger to Others None reported or observed

## 2023-10-04 NOTE — Group Note (Signed)
Date:  10/04/2023 Time:  10:27 PM  Group Topic/Focus:  Wrap-Up Group:   The focus of this group is to help patients review their daily goal of treatment and discuss progress on daily workbooks.    Additional Comments:  Pt was encouraged, but opted out of attending wrap up group this evening.   Chrisandra Netters 10/04/2023, 10:27 PM

## 2023-10-04 NOTE — Plan of Care (Signed)
Problem: Education: Goal: Knowledge of [place] [facility] Education information/materials will improve Outcome: Progressing Goal: Emotional status will improve Outcome: Progressing   Problem: Coping: Goal: Ability to verbalize frustrations and anger appropriately will improve Outcome: Progressing

## 2023-10-04 NOTE — Group Note (Signed)
Date:  10/04/2023 Time:  11:03 AM  Group Topic/Focus:  Goals Group:   The focus of this group is to help patients establish daily goals to achieve during treatment and discuss how the patient can incorporate goal setting into their daily lives to aide in recovery. Orientation:   The focus of this group is to educate the patient on the purpose and policies of crisis stabilization and provide a format to answer questions about their admission.  The group details unit policies and expectations of patients while admitted.    Participation Level:  Did Not Attend  Participation Quality:   n/a  Affect:   n/a  Cognitive:   n/a  Insight: None  Engagement in Group:   n/a  Modes of Intervention:   n/a  Additional Comments:   Pt did not attend.   Edmund Hilda Juri Dinning 10/04/2023, 11:03 AM

## 2023-10-04 NOTE — Progress Notes (Signed)
1st attempt - PSA  CSW attempted to complete PSA, and patient completed 40%.  He apologized, refused to continue, and asked to return after lunch.  When CSW returned after lunch, patient reported feeling nauseous and requested to return in an hour.  William Romero, LCSWA 10/04/23

## 2023-10-04 NOTE — [facility] Suicide Risk Assessment (Signed)
Suicide Risk Assessment  Admission Assessment    [facility] Admission Suicide Risk Assessment   Nursing information obtained from:  Patient Demographic factors:  Male, Caucasian Current Mental Status:  NA Loss Factors:  NA Historical Factors:  Impulsivity Risk Reduction Factors:  Positive therapeutic relationship  Total Time spent with patient: 45 minutes Principal Problem: Schizoaffective disorder (HCC) Diagnosis:  Principal Problem:   Schizoaffective disorder (HCC)   Subjective Data:   William Romero is a 43 y.o. male  with a past psychiatric history of schizophrenia, reported ADHD, GAD, and episodes of depression. Patient initially arrived to [facility] on 1/28 for brief SI without plan, worsening auditory hallucinations and paranoia leading to job loss, and admitted to [facility] on 1/28 for impaired functioning and stabilization of acute on chronic psychiatric conditions. PMHx is noncontributory.   HPI: Patient presented to [facility] voluntarily by himself 1/28, reporting brief suicidal ideation and worsening voices.  Was begun on Risperdal 0.5 mg twice daily, given 1 dose of this.  Required no agitation PRNs over that time.  Vitals were remarkable for blood pressure of 163/97 on admission, was within normal limits by discharge.  No hypertension medications given. Patient remained voluntary through transfer.  Lab work notable for: Positive for amphetamine and THC, hypokalemia to 3.3.  Mag within normal limits.  ETOH-.  UA/CBC/CMP otherwise unremarkable.  Lipid panel WNL.  Hemoglobin A1c 5.1.  On arrival to [facility], per nursing report, patient has difficulty with family relationships.  Has been living out of his car.  Rated anxiety 5/10, rated depression 0.  Endorses ongoing auditory hallucinations.  Slept 7.75 hours.   On interview, patient says that "the voices in my head are becoming worse" and presented to [facility] yesterday to seek help.  Patient says that the voices began to worsen when he was at  [facility], and felt that he needed to come in after losing his job.  Patient is actively responding to internal stimuli throughout interview, very paranoid, exhibits some thought blocking and disorganized speech.  Patient says he experienced voices at baseline ever since his father passed away 2 years ago.  These voices consist of people in his real life (including 2 of his grandmothers and a former Radio broadcast assistant). That these are constant and increasingly disturbing.  Patient says that the voices are projected thoughts/intent from real people in his life/members of his family, and that they increasingly want to hurt him.  Patient also endorses tactile hallucinations of someone "maybe a witch" touching him "through" his clothing. He asked Clinical research associate if this was possible.  Patient affirms that this feeling of touch happening during the interview.  Patient is afraid that his family/the voices in his head are "going to commit murder." Patient later in interview elaborates that he believes that these family members are part of a "larger conspiracy" and want to murder him, specifically.  Believes that his grandmother has been killing multiple people. Believes they are conspiring to keep him from being married and have a happy life, as they are married and he is not patient notes that 1 voice in his head, "[name]", has specific sexual perversions that disturb him.  Patient has been sleeping in his car for some time.  He denies visual hallucinations.  Patient affirms that he has sometimes felt the need to protect himself physically from harm.  However, patient denies access to firearms at this time.  Is ambivalent as to whether or not he has schizophrenia.  Denies current suicidal or homicidal ideation.  Of note, patient denies *ever* feeling suicidal ideation -- per conversation with Dr. Weston Settle 1/28 at [facility], patient at that time reported one attempt via overdose in the distant past.   Patient says that no medications have been useful  in controlling the voices.  Patient was prescribed Zyprexa 20 mg nightly as well as citalopram 60 mg daily, both of which he discontinued within a month after being discharged from [facility] 03/06/2023 under similar circumstances. Patient says that he has not been hospitalized in the interim.  Patient has no psychiatric medications other than Adderall, which he has been taking regularly.  Patient evasive when discussing history of Adderall prescription, says that he has had 1 continuous prescription for the past 5 years.  Patient has an unclear ADHD diagnosis -- was unable to undergo neuropsychiatric testing.  Per PDMP review, has only recently begun to resume his Adderall from [facility] [place], first fill 07/04/2023, latest fill 09/22/2023.  Patient had a outpatient psychiatric meeting with [facility] 04/2023, the overall purpose of which appeared to be receiving an Adderall prescription.   Patient denied presence of any collateral that could be called for him.  Continued Clinical Symptoms:  Alcohol Use Disorder Identification Test Final Score (AUDIT): 2 The "Alcohol Use Disorders Identification Test", Guidelines for Use in Primary Care, Second Edition.  World Science writer [facility]). Score between 0-7:  no or low risk or alcohol related problems. Score between 8-15:  moderate risk of alcohol related problems. Score between 16-19:  high risk of alcohol related problems. Score 20 or above:  warrants further diagnostic evaluation for alcohol dependence and treatment.   CLINICAL FACTORS:   Depression:   Anhedonia Delusional Hopelessness Insomnia Severe Alcohol/Substance Abuse/Dependencies Schizophrenia:   Depressive state Paranoid or undifferentiated type More than one psychiatric diagnosis Currently Psychotic Unstable or Poor Therapeutic Relationship   Psychiatric Specialty Exam:   Presentation    General Appearance:  Appropriate for Environment; Casual   Eye Contact:  Fair   Speech:   Clear and Coherent   Speech Volume:  Normal   Handedness:  Right     Mood and Affect    Mood:  Anxious; Depressed   Affect:  Congruent     Thinking     Thought Processes:  Disorganized   Descriptions of Associations:  Intact   Orientation:  Full (Time, Place and Person)   Thought Content:  Delusions; Paranoid Ideation; Illogical   History of Schizophrenia/Schizoaffective disorder:  Yes     Duration of Psychotic Symptoms: "Months"   Hallucinations:  Auditory; Tactile Voices of family members and friends present, saying that they want to murder.  Noncommand.   Ideas of Reference:  Paranoia; Delusions   Suicidal Thoughts:  No With Intent; Without Plan Without Plan   Homicidal Thoughts:  No Without Plan     Sensorium      Memory:  Immediate Fair   Judgment:  Fair   Insight:  Fair     Art therapist      Concentration:  Fair   Attention Span:  Fair   Recall:  Good   Fund of Knowledge:  Fair   Language:  Good     Musculoskeletal:   Strength & muscle tone: within normal limits Gait & station: normal Patient leans: N/A   Psychomotor Activity:  Normal       Assets:  Desire for Improvement; Communication Skills       Sleep:  Fair 7.75       Physical Exam  Constitutional:      General: He is not in acute distress.    Appearance: He is not ill-appearing.  Pulmonary:     Effort: Pulmonary effort is normal. No respiratory distress.  Neurological:     Mental Status: He is alert.      Review of Systems  All other systems reviewed and are negative.   Blood pressure 124/85, pulse 82, temperature 97.6 F (36.4 C), temperature source Oral, resp. rate 12, height 6' (1.829 m), weight 95.7 kg, SpO2 98%. Body mass index is 28.62 kg/m.   COGNITIVE FEATURES THAT CONTRIBUTE TO RISK:  Closed-mindedness, Loss of executive function, and Polarized thinking    SUICIDE RISK:  Moderate: Currently denies suicidal  ideation both now and in the past.  However, patient recently endorsed presence of brief, passive SI without plan before presenting to [facility] to another physician (Dr. Weston Settle) yesterday, 1/28.  Patient also endorsed 1 previous suicidal attempt via OD in the remote past to that provider.  These inconsistencies in reporting make accurate risk projection more difficult, despite currently denials of SI. Currently, there are no identifiable plans, no associated intent, mild dysphoria and related symptoms, reasonable self-control (both objective and subjective assessment), some other risk factors, and identifiable protective factors, including available and accessible social support.  PLAN OF CARE: See H&P for assessment and plan.   I certify that inpatient services furnished can reasonably be expected to improve the patient's condition.   Tomie China, MD 10/04/2023, 12:15 PM

## 2023-10-04 NOTE — Progress Notes (Addendum)
   10/03/23 2100  Psych Admission Type (Psych Patients Only)  Admission Status Voluntary  Psychosocial Assessment  Patient Complaints Other (Comment);Hopelessness ("I'm feeling sleepy from meds")  Eye Contact Darting  Facial Expression Blank  Affect Constricted  Speech Logical/coherent;Other (Comment) (minimal)  Interaction Minimal  Motor Activity Fidgety  Appearance/Hygiene Unremarkable  Behavior Characteristics Cooperative;Calm  Mood Euthymic  Thought Process  Coherency WDL  Content WDL  Delusions None reported or observed  Perception Hallucinations  Hallucination Auditory (Endorsed ongoing voices that are "violent and say bad words.  They are uncooperative and argumentative.")  Judgment Limited  Confusion None  Danger to Self  Current suicidal ideation? Denies  Agreement Not to Harm Self Yes  Description of Agreement Verbal  Danger to Others  Danger to Others None reported or observed

## 2023-10-04 NOTE — BH IP Treatment Plan (Signed)
Interdisciplinary Treatment and Diagnostic Plan Update  10/04/2023 Time of Session: 1049 William Romero MRN: [id number]  Principal Diagnosis: Schizoaffective disorder (HCC)  Secondary Diagnoses: Principal Problem:   Schizoaffective disorder (HCC)   Current Medications:  Current Facility-Administered Medications  Medication Dose Route Frequency Provider Last Rate Last Admin   acetaminophen (TYLENOL) tablet 650 mg  650 mg Oral Q6H PRN Margaretmary Dys, MD       alum & mag hydroxide-simeth (MAALOX/MYLANTA) 200-200-20 MG/5ML suspension 30 mL  30 mL Oral Q4H PRN Margaretmary Dys, MD       haloperidol (HALDOL) tablet 5 mg  5 mg Oral TID PRN Margaretmary Dys, MD   5 mg at 10/04/23 1319   And   diphenhydrAMINE (BENADRYL) capsule 50 mg  50 mg Oral TID PRN Margaretmary Dys, MD   50 mg at 10/04/23 1319   haloperidol lactate (HALDOL) injection 5 mg  5 mg Intramuscular TID PRN Margaretmary Dys, MD       And   diphenhydrAMINE (BENADRYL) injection 50 mg  50 mg Intramuscular TID PRN Margaretmary Dys, MD       And   LORazepam (ATIVAN) injection 2 mg  2 mg Intramuscular TID PRN Margaretmary Dys, MD       haloperidol lactate (HALDOL) injection 10 mg  10 mg Intramuscular TID PRN Margaretmary Dys, MD       And   diphenhydrAMINE (BENADRYL) injection 50 mg  50 mg Intramuscular TID PRN Margaretmary Dys, MD       And   LORazepam (ATIVAN) injection 2 mg  2 mg Intramuscular TID PRN Margaretmary Dys, MD       fluPHENAZine (PROLIXIN) tablet 2.5 mg  2.5 mg Oral Q8H Massengill, Nathan, MD       hydrOXYzine (ATARAX) tablet 50 mg  50 mg Oral TID PRN Margaretmary Dys, MD   50 mg at 10/04/23 0715   magnesium hydroxide (MILK OF MAGNESIA) suspension 30 mL  30 mL Oral Daily PRN Margaretmary Dys, MD       traZODone (DESYREL) tablet 50 mg  50  mg Oral QHS PRN Margaretmary Dys, MD   50 mg at 10/03/23 2102   PTA Medications: Medications Prior to Admission  Medication Sig Dispense Refill Last Dose/Taking   amphetamine-dextroamphetamine (ADDERALL) 20 MG tablet Take 20 mg by mouth 2 (two) times daily.      risperiDONE (RISPERDAL) 0.5 MG tablet Take 1 tablet (0.5 mg total) by mouth 2 (two) times daily.       Patient Stressors: Health problems    Patient Strengths: Ability for insight   Treatment Modalities: Medication Management, Group therapy, Case management,  1 to 1 session with clinician, Psychoeducation, Recreational therapy.   Physician Treatment Plan for Primary Diagnosis: Schizoaffective disorder (HCC) Long Term Goal(s):     Short Term Goals: Ability to identify changes in lifestyle to reduce recurrence of condition will improve Ability to verbalize feelings will improve Ability to disclose and discuss suicidal ideas Ability to demonstrate self-control will improve Ability to identify and develop effective coping behaviors will improve  Medication Management: Evaluate patient's response, side effects, and tolerance of medication regimen.  Therapeutic Interventions: 1 to 1 sessions, Unit Group sessions and Medication administration.  Evaluation of Outcomes: Not Progressing  Physician Treatment Plan for Secondary Diagnosis: Principal Problem:   Schizoaffective disorder (HCC)  Long Term Goal(s):  Short Term Goals: Ability to identify changes in lifestyle to reduce recurrence of condition will improve Ability to verbalize feelings will improve Ability to disclose and discuss suicidal ideas Ability to demonstrate self-control will improve Ability to identify and develop effective coping behaviors will improve     Medication Management: Evaluate patient's response, side effects, and tolerance of medication regimen.  Therapeutic Interventions: 1 to 1 sessions, Unit Group sessions and Medication  administration.  Evaluation of Outcomes: Not Progressing   RN Treatment Plan for Primary Diagnosis: Schizoaffective disorder (HCC) Long Term Goal(s): Knowledge of disease and therapeutic regimen to maintain health will improve  Short Term Goals: Ability to verbalize frustration and anger appropriately will improve, Ability to demonstrate self-control, Ability to participate in decision making will improve, Ability to verbalize feelings will improve, Ability to identify and develop effective coping behaviors will improve, and Compliance with prescribed medications will improve  Medication Management: RN will administer medications as ordered by provider, will assess and evaluate patient's response and provide education to patient for prescribed medication. RN will report any adverse and/or side effects to prescribing provider.  Therapeutic Interventions: 1 on 1 counseling sessions, Psychoeducation, Medication administration, Evaluate responses to treatment, Monitor vital signs and CBGs as ordered, Perform/monitor CIWA, COWS, AIMS and Fall Risk screenings as ordered, Perform wound care treatments as ordered.  Evaluation of Outcomes: Not Progressing   [name] Treatment Plan for Primary Diagnosis: Schizoaffective disorder (HCC) Long Term Goal(s): Safe transition to appropriate next level of care at discharge, Engage patient in therapeutic group addressing interpersonal concerns.  Short Term Goals: Engage patient in aftercare planning with referrals and resources, Increase ability to appropriately verbalize feelings, Increase emotional regulation, Identify triggers associated with mental health/substance abuse issues, and Increase skills for wellness and recovery  Therapeutic Interventions: Assess for all discharge needs, 1 to 1 time with Social worker, Explore available resources and support systems, Assess for adequacy in community support network, Educate family and significant other(s) on suicide  prevention, Complete Psychosocial Assessment, Interpersonal group therapy.  Evaluation of Outcomes: Not Progressing   Progress in Treatment: Attending groups: No. Participating in groups: No. Taking medication as prescribed: Yes. Toleration medication: Yes. Family/Significant other contact made: No, will contact:  PSA completion pending, CSW will continue to follow for receiving consent for collateral contact Patient understands diagnosis: Yes. Discussing patient identified problems/goals with staff: Yes. Medical problems stabilized or resolved: Yes. Denies suicidal/homicidal ideation: Yes. Issues/concerns per patient self-inventory: No. Other: None  New problem(s) identified: No, Describe:  None  New Short Term/Long Term Goal(s): medication stabilization, elimination of SI thoughts, development of comprehensive mental wellness plan.   Patient Goals: "I've got some very destructive voices and I want your help with that"  Discharge Plan or Barriers: Patient recently admitted. CSW will continue to follow and assess for appropriate referrals and possible discharge planning.    Reason for Continuation of Hospitalization: Anxiety Medication stabilization Other; describe psychosis  Estimated Length of Stay: 5-7 Days  Last 3 [place] Suicide Severity Risk Score: Flowsheet Row [facility]Current[facility] Most recent reading at 10/03/2023  5:36 PM ED from 10/03/2023 in [facility] Most recent reading at 10/03/2023  2:15 PM Office Visit from 04/03/2023 in [facility] Most recent reading at 04/03/2023  2:21 PM  C-SSRS RISK CATEGORY No Risk Error: Q3, 4, or 5 should not be populated when Q2 is No Error: Q3, 4, or 5 should not be populated  when Q2 is No       Last PHQ 2/9 Scores:    04/20/2023    4:08 PM 04/03/2023    2:21 PM 03/30/2023   10:35 AM  Depression screen PHQ 2/9   Decreased Interest 1 3 1   Down, Depressed, Hopeless 0 2 1  PHQ - 2 Score 1 5 2   Altered sleeping 1 0 0  Tired, decreased energy 0 3 1  Change in appetite 0 3 0  Feeling bad or failure about yourself  0 0 0  Trouble concentrating 3 3 3   Moving slowly or fidgety/restless 3 0 3  Suicidal thoughts 0 0 0  PHQ-9 Score 8 14 9   Difficult doing work/chores Not difficult at all Somewhat difficult     Scribe for Treatment Team: Jacinta Shoe, [name] 10/04/2023 3:11 PM

## 2023-10-04 NOTE — Group Note (Signed)
Date:  10/04/2023 Time:  4:17 PM  Group Topic/Focus:  Dimensions of Wellness:   The focus of this group is to introduce the topic of wellness and discuss the role each dimension of wellness plays in total health.    Participation Level:  Did Not Attend  Participation Quality:   n/a  Affect:   n/a  Cognitive:   n/a  Insight: None  Engagement in Group:   n/a  Modes of Intervention:   n/a  Additional Comments:   Pt did not attend.  Edmund Hilda Sofia Vanmeter 10/04/2023, 4:17 PM
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
## 2023-10-04 NOTE — Plan of Care (Signed)
Problem: Education: Goal: Knowledge of [facility] Education information/materials will improve Outcome: Progressing Goal: Verbalization of understanding the information provided will improve Outcome: Progressing

## 2023-10-05 DIAGNOSIS — F251 Schizoaffective disorder, depressive type: Secondary | ICD-10-CM | POA: Diagnosis not present

## 2023-10-05 LAB — BASIC METABOLIC PANEL
Anion gap: 9 (ref 5–15)
BUN: 11 mg/dL (ref 6–20)
CO2: 27 mmol/L (ref 22–32)
Calcium: 8.7 mg/dL — ABNORMAL LOW (ref 8.9–10.3)
Chloride: 103 mmol/L (ref 98–111)
Creatinine, Ser: 0.99 mg/dL (ref 0.61–1.24)
GFR, Estimated: >60 mL/min (ref >60)
Glucose, Bld: 82 mg/dL (ref 70–99)
Potassium: 4.2 mmol/L (ref 3.5–5.1)
Sodium: 139 mmol/L (ref 135–145)

## 2023-10-05 MED ORDER — FLUPHENAZINE HCL 5 MG/ML PO CONC
5.0000 mg | Freq: Three times a day (TID) | ORAL | Status: DC
  Administered 2023-10-05 – 2023-10-07 (×6): 5 mg via ORAL
  Filled 2023-10-05 (×10): qty 1

## 2023-10-05 MED ORDER — BENZTROPINE MESYLATE 0.5 MG PO TABS
0.5000 mg | ORAL_TABLET | Freq: Every day | ORAL | Status: DC
  Administered 2023-10-05 – 2023-10-12 (×8): 0.5 mg via ORAL
  Filled 2023-10-05 (×11): qty 1

## 2023-10-05 NOTE — Progress Notes (Signed)

## 2023-10-05 NOTE — [facility] Group Notes (Signed)
Patient did not attend the Emotional Wellness group.

## 2023-10-05 NOTE — Group Note (Signed)
[name] Group Therapy Note   Group Date: 10/05/2023 Start Time: 1100 End Time: 1200   Type of Therapy:  Group Therapy  Topic:  Understanding Your Path to Change  Participation:  did not attend  Objective:  The goal is to help participants understand the stages of change, identify where they currently are in the process, and provide actionable next steps to continue moving forward in their journey of change.  Goals: Learn about the six stages of change:  Precontemplation, Contemplation, Preparation, Action, Maintenance, and Relapse Reflect on Current Change Efforts:  Recognize which stage participants are in regarding to a personal change. Plan Next Steps for Moving Forward:  Create an action plan based on their current stage of change.  Class Summary:  In this session, we explored the Stages of Change as a framework to understand the process of change.  We discussed how each stage helps individuals recognize where they are in their personal journey and used the Stages of Change Worksheet for self-reflection.  Participants answered questions to better understand their current stage, challenges, and progress. We also emphasized the importance of moving forward, even if setbacks (Relapse) occur, and created actionable steps to help participants continue progressing. By the end of the session, participants gained a clearer understanding of their path to change and left with a clear plan for next steps.  Therapeutic Modalities:  Elements of CBT (cognitive restructuring, problem solving)  Element of DBT (mindfulness, distress tolerance)   Verdun Rackley O Kelten Enochs, LCSWA 10/05/2023  6:40 PM

## 2023-10-05 NOTE — [facility] Group Notes (Signed)
[facility] Group Notes:  (Nursing/MHT/Case Management/Adjunct)  Date:  10/05/2023  Time:  8:46 PM  Type of Therapy:   Wrap-up group  Participation Level:  Did Not Attend  Participation Quality:    Affect:    Cognitive:    Insight:    Engagement in Group:    Modes of Intervention:    Summary of Progress/Problems: Refused to attend group  Noah Delaine 10/05/2023, 8:46 PM

## 2023-10-05 NOTE — [facility] Counselor (Signed)
Adult Comprehensive Assessment  Patient ID: William Romero, male   DOB: March 06, 1981, 43 y.o.   MRN: [id number]   2nd attempt on PSA pt stated "I really cannot right now. I don't feel good. Too many voices in my head." Pt requested for this writer to come back tomorrow.     Kathi Der. 10/05/2023

## 2023-10-05 NOTE — Plan of Care (Signed)
Problem: Education: Goal: Emotional status will improve Outcome: Progressing Goal: Verbalization of understanding the information provided will improve Outcome: Progressing   Problem: Activity: Goal: Interest or engagement in activities will improve Outcome: Progressing   Problem: Coping: Goal: Ability to verbalize frustrations and anger appropriately will improve Outcome: Progressing   Problem: Health Behavior/Discharge Planning: Goal: Compliance with treatment plan for underlying cause of condition will improve Outcome: Progressing   Problem: Safety: Goal: Periods of time without injury will increase Outcome: Progressing

## 2023-10-05 NOTE — Plan of Care (Signed)
Problem: Education: Goal: Knowledge of [facility] Education information/materials will improve Outcome: Progressing Goal: Emotional status will improve Outcome: Progressing Goal: Mental status will improve Outcome: Progressing Goal: Verbalization of understanding the information provided will improve Outcome: Progressing   Problem: Activity: Goal: Interest or engagement in activities will improve Outcome: Progressing Goal: Sleeping patterns will improve Outcome: Progressing

## 2023-10-05 NOTE — Progress Notes (Signed)
[facility] MD Progress Note  10/05/2023 7:58 AM William Romero  MRN:  [id number]  Principal Problem: Schizoaffective disorder (HCC) Diagnosis: Principal Problem:   Schizoaffective disorder (HCC)   Reason for Admission:  William Romero is a 43 y.o. male  with a past psychiatric history of schizophrenia, reported ADHD, GAD, and episodes of depression. Patient initially arrived to [facility] on 1/28 for brief SI without plan, worsening auditory hallucinations and paranoia leading to job loss, and admitted to [facility] on 1/28 for impaired functioning and stabilization of acute on chronic psychiatric conditions. PMHx is noncontributory.    Yesterday, the psychiatry team made following recommendations:   Stop Risperdal 0.5 mL Rams twice daily Start Prolixin 2.5 mg every 8 hours  Pertinent information discussed during bed progression: Slept 9.5 hours.  Reached out to staff, saying the voices were "giving him a panic attack."  Received PRN PO Haldol 5 mg x1, PO Benadryl 50 mg x1.  Interval events: Patient presented with anxiety/agitation related to auditory hallucinations, observed pacing in RTIS, and asked for some medications to help with this.  Given PO Haldol and Benadryl.  No acute events.  Slightly hypertensive to 128/94.  Vitals otherwise within normal limits.  Awaiting BMP this morning in setting of previous hypokalemia.  No medication refusals.  PRNs: PO Haldol 5 mg x1, PO Benadryl 50 mg x1, hydroxyzine 50 mg x3, trazodone 50 mg x1.  On interview, patient is lying in bed, tired appearing but attentive to conversation and interactive.  Patient said that he was "having trouble sorting things out", continues to hear auditory hallucinations that are "not going in the correct place."  Says that medication as far as not help with the voices.  When asked about incident yesterday requiring PRNs, patient said that these medicines also did not help.  Patient continues to endorse tactile  hallucinations that presented as "being touched" possibly by a witch.  Patient was unable to discuss his mood, as he is actively disorganized.  Sleeping well.  Patient said that he has "no appetite" and similar to how he was before hospitalization.  Denied somatic symptoms medication side effects.  Denied SI, HI and visual hallucinations.  Past Psychiatric History:   Current psychiatrist: Previously seen by Dr. Park Pope 04/13/2023, did not follow up with  Current therapist: none. Previous reported psychiatric diagnoses: MDD, ?ADHD, schizophrenia, GAD Current psychiatric medications: none. Psychiatric medication history/compliance: Per chart review, poor with the exception of Adderall.  Full med trials include: adderall, bupropion, duloxetine, haloperidol, hydroxyzine (ineffective), lorazepam, olanzapine, ziprasidone, paliperidone, risperidone,  Psychiatric hospitalization(s): Approximately 6 stays over the past from 2012-2019.  Most recently, patient had 2 separate stays at [facility] in 2024, 6/11 - 6/19 and 6/22 - 7/1. Psychotherapy history: None Neuromodulation history: none. History of suicide (obtained from HPI): Reported 1 attempted OD in distant past, presently denied History of homicide or aggression (obtained in HPI): none.  Family Psychiatric History:  Denied.   Social History:   Living situation:  Lives in car. Education: Grade 12 Occupational history: Lost job 1/27 Marital status: Single Children: none. Legal: denied. Military: none.  Past Medical History:  Past Medical History:  Diagnosis Date   Paranoid schizophrenia (HCC) 10/03/2023   Schizophrenia (HCC)    Family History: History reviewed. No pertinent family history.  Current Medications: Current Facility-Administered Medications  Medication Dose Route Frequency Provider Last Rate Last Admin   acetaminophen (TYLENOL) tablet 650 mg  650 mg Oral Q6H PRN Margaretmary Dys, MD  alum & mag  hydroxide-simeth (MAALOX/MYLANTA) 200-200-20 MG/5ML suspension 30 mL  30 mL Oral Q4H PRN Margaretmary Dys, MD       haloperidol (HALDOL) tablet 5 mg  5 mg Oral TID PRN Margaretmary Dys, MD   5 mg at 10/05/23 3664   And   diphenhydrAMINE (BENADRYL) capsule 50 mg  50 mg Oral TID PRN Margaretmary Dys, MD   50 mg at 10/05/23 4034   haloperidol lactate (HALDOL) injection 5 mg  5 mg Intramuscular TID PRN Margaretmary Dys, MD       And   diphenhydrAMINE (BENADRYL) injection 50 mg  50 mg Intramuscular TID PRN Margaretmary Dys, MD       And   LORazepam (ATIVAN) injection 2 mg  2 mg Intramuscular TID PRN Margaretmary Dys, MD       haloperidol lactate (HALDOL) injection 10 mg  10 mg Intramuscular TID PRN Margaretmary Dys, MD       And   diphenhydrAMINE (BENADRYL) injection 50 mg  50 mg Intramuscular TID PRN Margaretmary Dys, MD       And   LORazepam (ATIVAN) injection 2 mg  2 mg Intramuscular TID PRN Margaretmary Dys, MD       fluPHENAZine (PROLIXIN) tablet 2.5 mg  2.5 mg Oral Q8H Massengill, Harrold Donath, MD   2.5 mg at 10/05/23 7425   hydrOXYzine (ATARAX) tablet 50 mg  50 mg Oral TID PRN Margaretmary Dys, MD   50 mg at 10/05/23 9563   magnesium hydroxide (MILK OF MAGNESIA) suspension 30 mL  30 mL Oral Daily PRN Margaretmary Dys, MD       traZODone (DESYREL) tablet 50 mg  50 mg Oral QHS PRN Margaretmary Dys, MD   50 mg at 10/04/23 2118    Lab Results:  Results for orders placed or performed during the hospital encounter of 10/03/23 (from the past 48 hours)  POCT Urine Drug Screen - (I-Screen)     Status: Abnormal   Collection Time: 10/03/23 11:59 AM  Result Value Ref Range   POC Amphetamine UR Positive (A) NONE DETECTED (Cut Off Level 1000 ng/mL)   POC Secobarbital (BAR) None Detected NONE DETECTED (Cut Off Level 300 ng/mL)    POC Buprenorphine (BUP) None Detected NONE DETECTED (Cut Off Level 10 ng/mL)   POC Oxazepam (BZO) None Detected NONE DETECTED (Cut Off Level 300 ng/mL)   POC Cocaine UR None Detected NONE DETECTED (Cut Off Level 300 ng/mL)   POC Methamphetamine UR None Detected NONE DETECTED (Cut Off Level 1000 ng/mL)   POC Morphine None Detected NONE DETECTED (Cut Off Level 300 ng/mL)   POC Methadone UR None Detected NONE DETECTED (Cut Off Level 300 ng/mL)   POC Oxycodone UR None Detected NONE DETECTED (Cut Off Level 100 ng/mL)   POC Marijuana UR Positive (A) NONE DETECTED (Cut Off Level 50 ng/mL)  Urinalysis, Routine w reflex microscopic -Urine, Clean Catch     Status: None   Collection Time: 10/03/23  1:07 PM  Result Value Ref Range   Color, Urine YELLOW YELLOW   APPearance CLEAR CLEAR   Specific Gravity, Urine 1.025 1.005 - 1.030   pH 5.0 5.0 - 8.0   Glucose, UA NEGATIVE NEGATIVE mg/dL   Hgb urine dipstick NEGATIVE NEGATIVE   Bilirubin Urine NEGATIVE NEGATIVE   Ketones, ur NEGATIVE NEGATIVE mg/dL   Protein, ur NEGATIVE NEGATIVE mg/dL  Nitrite NEGATIVE NEGATIVE   Leukocytes,Ua NEGATIVE NEGATIVE    Comment: Performed at [facility], [address] [place], [place] [address]  CBC with Differential/Platelet     Status: None   Collection Time: 10/03/23  1:08 PM  Result Value Ref Range   WBC 5.8 4.0 - 10.5 K/uL   RBC 4.81 4.22 - 5.81 MIL/uL   Hemoglobin 14.4 13.0 - 17.0 g/dL   HCT 91.4 78.2 - 95.6 %   MCV 86.7 80.0 - 100.0 fL   MCH 29.9 26.0 - 34.0 pg   MCHC 34.5 30.0 - 36.0 g/dL   RDW 21.3 08.6 - 57.8 %   Platelets 361 150 - 400 K/uL   nRBC 0.0 0.0 - 0.2 %   Neutrophils Relative % 70 %   Neutro Abs 4.0 1.7 - 7.7 K/uL   Lymphocytes Relative 17 %   Lymphs Abs 1.0 0.7 - 4.0 K/uL   Monocytes Relative 11 %   Monocytes Absolute 0.6 0.1 - 1.0 K/uL   Eosinophils Relative 1 %   Eosinophils Absolute 0.1 0.0 - 0.5 K/uL   Basophils Relative 1 %   Basophils Absolute 0.1 0.0 - 0.1 K/uL    Immature Granulocytes 0 %   Abs Immature Granulocytes 0.01 0.00 - 0.07 K/uL    Comment: Performed at [facility], [address] [place], [place] [address]  Comprehensive metabolic panel     Status: Abnormal   Collection Time: 10/03/23  1:08 PM  Result Value Ref Range   Sodium 139 135 - 145 mmol/L   Potassium 3.3 (L) 3.5 - 5.1 mmol/L   Chloride 105 98 - 111 mmol/L   CO2 27 22 - 32 mmol/L   Glucose, Bld 94 70 - 99 mg/dL    Comment: Glucose reference range applies only to samples taken after fasting for at least 8 hours.   BUN 8 6 - 20 mg/dL   Creatinine, Ser 9.52 0.61 - 1.24 mg/dL   Calcium 9.0 8.9 - 84.1 mg/dL   Total Protein 7.6 6.5 - 8.1 g/dL   Albumin 3.6 3.5 - 5.0 g/dL   AST 21 15 - 41 U/L   ALT 20 0 - 44 U/L   Alkaline Phosphatase 83 38 - 126 U/L   Total Bilirubin 0.6 0.0 - 1.2 mg/dL   GFR, Estimated >32 >44 mL/min    Comment: (NOTE) Calculated using the CKD-EPI Creatinine Equation (2021)    Anion gap 7 5 - 15    Comment: Performed at [facility], [address] [place], [place] [address]  Hemoglobin A1c     Status: None   Collection Time: 10/03/23  1:08 PM  Result Value Ref Range   Hgb A1c MFr Bld 5.1 4.8 - 5.6 %    Comment: (NOTE) Pre diabetes:          5.7%-6.4%  Diabetes:              >6.4%  Glycemic control for   <7.0% adults with diabetes    Mean Plasma Glucose 99.67 mg/dL    Comment: Performed at [facility], [address] [place], [place] [address]  Magnesium     Status: None   Collection Time: 10/03/23  1:08 PM  Result Value Ref Range   Magnesium 2.1 1.7 - 2.4 mg/dL    Comment: Performed at [facility], [address], [place], [place] [address]  Ethanol     Status: None  Collection Time: 10/03/23  1:08 PM  Result Value Ref Range   Alcohol, Ethyl (B) <10 <10 mg/dL    Comment: (NOTE) Lowest detectable limit for serum alcohol is 10 mg/dL.  For medical purposes only. Performed at [facility], [address] [place], [place] [address]   Lipid panel     Status: None   Collection Time: 10/03/23  1:09 PM  Result Value Ref Range   Cholesterol 157 0 - 200 mg/dL   Triglycerides 76 <604 mg/dL   HDL 44 >54 mg/dL   Total CHOL/HDL Ratio 3.6 RATIO   VLDL 15 0 - 40 mg/dL   LDL Cholesterol 98 0 - 99 mg/dL    Comment:        Total Cholesterol/HDL:CHD Risk Coronary Heart Disease Risk Table                     Men   Women  1/2 Average Risk   3.4   3.3  Average Risk       5.0   4.4  2 X Average Risk   9.6   7.1  3 X Average Risk  23.4   11.0        Use the calculated Patient Ratio above and the CHD Risk Table to determine the patient's CHD Risk.        ATP III CLASSIFICATION (LDL):  <100     mg/dL   Optimal  098-119  mg/dL   Near or Above                    Optimal  130-159  mg/dL   Borderline  147-829  mg/dL   High  >562     mg/dL   Very High Performed at [facility], [address] [place], [place] [address]   TSH     Status: None   Collection Time: 10/03/23  1:09 PM  Result Value Ref Range   TSH 0.641 0.350 - 4.500 uIU/mL    Comment: Performed by a 3rd Generation assay with a functional sensitivity of <=0.01 uIU/mL. Performed at [facility], [address] [place], [place] [address]     Blood Alcohol level:  Lab Results  Component Value Date   ETH <10 10/03/2023   ETH <10 02/24/2023    Metabolic Labs: Lab Results  Component Value Date   HGBA1C 5.1 10/03/2023   MPG 99.67 10/03/2023   MPG 96.8 02/13/2023   No results found for: "PROLACTIN" Lab Results  Component Value Date   CHOL 157 10/03/2023   TRIG 76 10/03/2023   HDL 44 10/03/2023   CHOLHDL 3.6 10/03/2023   VLDL 15 10/03/2023   LDLCALC 98 10/03/2023   LDLCALC 77 02/13/2023    Physical Findings: AIMS: No  CIWA:    COWS:     Psychiatric Specialty Exam:  Presentation  General Appearance: Appropriate for Environment; Casual  Eye Contact:Fair  Speech:Clear and Coherent  Speech  Volume:Normal  Handedness:Right   Mood and Affect  Mood:Anxious; Depressed  Affect:Congruent   Thought Process  Thought Processes:Disorganized  Descriptions of Associations:Intact  Orientation:Full (Time, Place and Person)  Thought Content:Delusions; Paranoid Ideation; Illogical  History of Schizophrenia/Schizoaffective disorder:Yes  Duration of Psychotic Symptoms:Greater than six months  Hallucinations:Hallucinations: Auditory; Tactile Description of Auditory Hallucinations: Voices of family members and friends present, saying that they want to murder.  Noncommand.  Ideas of Reference:Paranoia; Delusions  Suicidal Thoughts:Suicidal Thoughts: No  Homicidal Thoughts:Homicidal  Thoughts: No   Sensorium  Memory:Immediate Fair  Judgment:Fair  Insight:Fair   Executive Functions  Concentration:Fair  Attention Span:Fair  Recall:Good  Fund of Knowledge:Fair  Language:Good   Psychomotor Activity  Psychomotor Activity:Psychomotor Activity: Normal   Assets  Assets:Desire for Improvement; Communication Skills   Sleep  Sleep:Sleep: Fair Number of Hours of Sleep: 7.75    Physical Exam: Physical Exam Constitutional:      General: He is not in acute distress.    Appearance: He is not ill-appearing.     Comments: Malodorous  Pulmonary:     Effort: Pulmonary effort is normal. No respiratory distress.  Neurological:     Mental Status: He is alert.    Review of Systems  Constitutional:  Negative for chills and fever.  Cardiovascular:  Negative for chest pain.  Gastrointestinal:  Negative for nausea and vomiting.   Blood pressure (!) 128/94, pulse 85, temperature 97.6 F (36.4 C), temperature source Oral, resp. rate 12, height 6' (1.829 m), weight 95.7 kg, SpO2 99%. Body mass index is 28.62 kg/m.  Treatment Plan Summary: Daily contact with patient to assess and evaluate symptoms and progress in treatment and Medication  management   ASSESSMENT:  Diagnoses / Active Problems: Schizoaffective disorder, depressive type, in depressive episode Cannabis use disorder   PLAN:   Safety and Monitoring: -  VOLUNTARY  admission to inpatient psychiatric unit for safety, stabilization and treatment. - Daily contact with patient to assess and evaluate symptoms and progress in treatment - Patient's case to be discussed in multi-disciplinary team meeting -  Observation Level : q15 minute checks -  Vital signs:  q12 hours -  Precautions: suicide, elopement, and assault   2. Psychiatric Diagnoses and Treatment:     #Schizoaffective disorder, depressive type, in depressive episode #Cannabis use disorder - Increase Prolixin 2.5 mg every 8 hours ? Prolixin 5 mg every 8 hours (liquid formulation).  Plan continues to be LAI before discharge. - Continue PRN Vistaril 50 mg 3 times daily for anxiety. - The risks/benefits/side-effects/alternatives to this medication were discussed in detail with the patient and time was given for questions. The patient consents to medication trial.  - Metabolic profile and EKG monitoring obtained while on an atypical antipsychotic  BMI: 28.62 TSH: 0.641 Lipid panel: LDL 98 WNL HbgA1c: 5.1 QTc: 462 - Encouraged patient to participate in unit milieu and in scheduled group therapies  - Short Term Goals: Ability to identify changes in lifestyle to reduce recurrence of condition will improve, Ability to verbalize feelings will improve, Ability to disclose and discuss suicidal ideas, Ability to demonstrate self-control will improve, and Ability to identify and develop effective coping behaviors will improve - Long Term Goals: Improvement in symptoms so as ready for discharge   Other PRNS: Atarax 50 mg 3 times daily, trazodone 50 mg nightly, Tylenol 650 mg every 6 hours, agitation   Other labs reviewed on admission:                3. Medical Issues Being Addressed:   #Hypokalemia - K 3.3 -  Repeat BMP 1/30, awaiting   4. Discharge Planning:    - Estimated discharge date: 5-6 days - Social work and case management to assist with discharge planning and identification of hospital follow-up needs prior to discharge. - Discharge concerns: Need to establish a safety plan; medication compliance and effectiveness. - Discharge goals: Return home with outpatient referrals for mental health follow-up including medication management/psychotherapy.   I certify that inpatient services furnished  can reasonably be expected to improve the patient's condition.     Luiz Iron, MD PGY-1, Psychiatry Residency   1/30/20257:58 AM

## 2023-10-05 NOTE — Progress Notes (Signed)
   10/05/23 0900  Psych Admission Type (Psych Patients Only)  Admission Status Voluntary  Psychosocial Assessment  Patient Complaints Other (Comment);Anxiety;Depression (AH "voices")  Eye Contact Fair  Facial Expression Anxious  Affect Anxious  Speech Logical/coherent  Interaction Assertive  Motor Activity Other (Comment) (WNL)  Appearance/Hygiene Unremarkable  Behavior Characteristics Cooperative;Anxious  Mood Anxious  Thought Process  Coherency WDL  Content WDL  Delusions None reported or observed  Perception Hallucinations  Hallucination Auditory  Judgment Impaired  Confusion None  Danger to Self  Current suicidal ideation? Denies  Agreement Not to Harm Self Yes  Description of Agreement verbal  Danger to Others  Danger to Others None reported or observed

## 2023-10-06 DIAGNOSIS — F251 Schizoaffective disorder, depressive type: Secondary | ICD-10-CM | POA: Diagnosis not present

## 2023-10-06 MED ORDER — CLONIDINE HCL 0.1 MG PO TABS: 0.1000 mg | ORAL_TABLET | ORAL | Status: DC | PRN

## 2023-10-06 MED ORDER — AMLODIPINE BESYLATE 5 MG PO TABS
5.0000 mg | ORAL_TABLET | Freq: Every day | ORAL | Status: DC
  Administered 2023-10-06 – 2023-10-12 (×7): 5 mg via ORAL
  Filled 2023-10-06 (×9): qty 1

## 2023-10-06 MED ORDER — FLUOXETINE HCL 20 MG PO CAPS
20.0000 mg | ORAL_CAPSULE | Freq: Every day | ORAL | Status: DC
Start: 2023-10-06 — End: 2023-10-07
  Administered 2023-10-06 – 2023-10-07 (×2): 20 mg via ORAL
  Filled 2023-10-06 (×4): qty 1

## 2023-10-06 NOTE — [facility] Group Notes (Signed)
[facility] Group Notes:  (Nursing/MHT/Case Management/Adjunct)  Date:  10/06/2023  Time:  8:13 PM  Type of Therapy:   AA Grroup  Participation Level:  Did Not Attend  Participation Quality:    Affect:    Cognitive:    Insight:    Engagement in Group:    Modes of Intervention:    Summary of Progress/Problems: Pt refused to attend group.  William Romero 10/06/2023, 8:13 PM
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
## 2023-10-06 NOTE — Group Note (Signed)
Date:  10/06/2023 Time:  1:58 PM  Group Topic/Focus:  Goals Group:   The focus of this group is to help patients establish daily goals to achieve during treatment and discuss how the patient can incorporate goal setting into their daily lives to aide in recovery. Orientation:   The focus of this group is to educate the patient on the purpose and policies of crisis stabilization and provide a format to answer questions about their admission.  The group details unit policies and expectations of patients while admitted.    Participation Level:  Did Not Attend  Participation Quality:   n/a  Affect:   n/a  Cognitive:   n/a  Insight: None  Engagement in Group:   n/a  Modes of Intervention:   n/a  Additional Comments:   Pt did not attend.   Edmund Hilda Shavon Ashmore 10/06/2023, 1:58 PM

## 2023-10-06 NOTE — [facility] Group Notes (Signed)
Spiritual care group facilitated by Chaplain Katy Denessa Cavan, BCC  Group focused on topic of strength. Group members reflected on what thoughts and feelings emerge when they hear this topic. They then engaged in facilitated dialog around how strength is present in their lives. This dialog focused on representing what strength had been to them in their lives (images and patterns given) and what they saw as helpful in their life now (what they needed / wanted).  Activity drew on narrative framework.  Patient Progress: Did not attend.  

## 2023-10-06 NOTE — Progress Notes (Signed)
[facility] MD Progress Note  10/06/2023 12:44 PM William Romero  MRN:  [id number]  Subjective:    William Romero is a 43 y.o. male  with a past psychiatric history of schizophrenia, reported ADHD, GAD, and episodes of depression. Patient initially arrived to [facility] on 1/28 for brief SI without plan, worsening auditory hallucinations and paranoia leading to job loss, and admitted to [facility]untary on 1/28 for impaired functioning and stabilization of acute on chronic psychiatric conditions. PMHx is noncontributory.   On examination today, patient reports those hallucinations continue, unchanged.  Reports tolerating the Prolixin well without any side effects.  He is brighter this morning, more interactive and his concentration is better.  He reports feeling some depressed, which he reported to the resident a few days ago, but is denied to me consistently.  He reports he would like to be restarted on antidepressant medication.  Otherwise he reports his sleep is okay.  Appetite is okay.  Denies any SI or HI.  Reports some paranoia, but is unsure why he feels on edge and hypervigilant around others.  Per nursing staff he is irritable and reports wanting to internal stimuli, sleeping 7.75 hours last night.  Patient is agreeable to starting antidepressant medication.  He previously taking Cymbalta, but his blood pressure is high so we will start and SSRI medication instead.  Principal Problem: Schizoaffective disorder (HCC) Diagnosis: Principal Problem:   Schizoaffective disorder (HCC)  Total Time spent with patient: 20 minutes  Past Psychiatric History:   Current psychiatrist: Previously seen by Dr. Park Pope 04/13/2023, did not follow up with  Current therapist: none. Previous reported psychiatric diagnoses: MDD, ?ADHD, schizophrenia, GAD Current psychiatric medications: none. Psychiatric medication history/compliance: Per chart review, poor with the exception of Adderall.  Full med trials  include: adderall, bupropion, duloxetine, haloperidol, hydroxyzine (ineffective), lorazepam, olanzapine, ziprasidone, paliperidone, risperidone,  Psychiatric hospitalization(s): Approximately 6 stays over the past from 2012-2019.  Most recently, patient had 2 separate stays at [facility] in 2024, 6/11 - 6/19 and 6/22 - 7/1. Psychotherapy history: None Neuromodulation history: none. History of suicide (obtained from HPI): Reported 1 attempted OD in distant past, presently denied History of homicide or aggression (obtained in HPI): none.    Past Medical History:  Past Medical History:  Diagnosis Date   Paranoid schizophrenia (HCC) 10/03/2023   Schizophrenia (HCC)     Past Surgical History:  Procedure Laterality Date   APPENDECTOMY     Family History: History reviewed. No pertinent family history. Family Psychiatric  History: See H&P Social History:  Social History   Substance and Sexual Activity  Alcohol Use Not Currently   Alcohol/week: 5.0 standard drinks of alcohol   Types: 5 Cans of beer per week     Social History   Substance and Sexual Activity  Drug Use No    Social History   Socioeconomic History   Marital status: Single    Spouse name: Not on file   Number of children: Not on file   Years of education: Not on file   Highest education level: Not on file  Occupational History   Not on file  Tobacco Use   Smoking status: Former    Current packs/day: 0.50    Types: Cigarettes   Smokeless tobacco: Never  Vaping Use   Vaping status: Never Used  Substance and Sexual Activity   Alcohol use: Not Currently    Alcohol/week: 5.0 standard drinks of alcohol    Types: 5 Cans of beer per week  Drug use: No   Sexual activity: Not on file  Other Topics Concern   Not on file  Social History Narrative   Not on file   Social Drivers of Health   Financial Resource Strain: Not on file  Food Insecurity: No Food Insecurity (10/03/2023)   Hunger Vital Sign    Worried About  Running Out of Food in the Last Year: Never true    Ran Out of Food in the Last Year: Never true  Transportation Needs: No Transportation Needs (10/03/2023)   PRAPARE - Administrator, Civil Service (Medical): No    Lack of Transportation (Non-Medical): No  Physical Activity: Not on file  Stress: Not on file  Social Connections: Not on file   Additional Social History:                           Current Medications: Current Facility-Administered Medications  Medication Dose Route Frequency Provider Last Rate Last Admin   acetaminophen (TYLENOL) tablet 650 mg  650 mg Oral Q6H PRN Margaretmary Dys, MD       alum & mag hydroxide-simeth (MAALOX/MYLANTA) 200-200-20 MG/5ML suspension 30 mL  30 mL Oral Q4H PRN Margaretmary Dys, MD       amLODipine (NORVASC) tablet 5 mg  5 mg Oral Daily Vinaya Sancho, MD       benztropine (COGENTIN) tablet 0.5 mg  0.5 mg Oral Daily Marlow Hendrie, MD   0.5 mg at 10/06/23 0930   cloNIDine (CATAPRES) tablet 0.1 mg  0.1 mg Oral Q4H PRN Kelcy Baeten, Harrold Donath, MD       haloperidol (HALDOL) tablet 5 mg  5 mg Oral TID PRN Margaretmary Dys, MD   5 mg at 10/05/23 4098   And   diphenhydrAMINE (BENADRYL) capsule 50 mg  50 mg Oral TID PRN Margaretmary Dys, MD   50 mg at 10/05/23 1191   haloperidol lactate (HALDOL) injection 5 mg  5 mg Intramuscular TID PRN Margaretmary Dys, MD       And   diphenhydrAMINE (BENADRYL) injection 50 mg  50 mg Intramuscular TID PRN Margaretmary Dys, MD       And   LORazepam (ATIVAN) injection 2 mg  2 mg Intramuscular TID PRN Margaretmary Dys, MD       haloperidol lactate (HALDOL) injection 10 mg  10 mg Intramuscular TID PRN Margaretmary Dys, MD       And   diphenhydrAMINE (BENADRYL) injection 50 mg  50 mg Intramuscular TID PRN Margaretmary Dys, MD       And   LORazepam (ATIVAN)  injection 2 mg  2 mg Intramuscular TID PRN Margaretmary Dys, MD       FLUoxetine (PROZAC) capsule 20 mg  20 mg Oral Daily Mitsue Peery, MD       fluPHENAZine (PROLIXIN) 5 MG/ML solution 5 mg  5 mg Oral Q8H Tomie China, MD   5 mg at 10/06/23 4782   hydrOXYzine (ATARAX) tablet 50 mg  50 mg Oral TID PRN Margaretmary Dys, MD   50 mg at 10/06/23 0606   magnesium hydroxide (MILK OF MAGNESIA) suspension 30 mL  30 mL Oral Daily PRN Margaretmary Dys, MD       traZODone (DESYREL) tablet 50 mg  50 mg Oral QHS PRN Margaretmary Dys, MD   50 mg at  10/05/23 2132    Lab Results:  Results for orders placed or performed during the hospital encounter of 10/03/23 (from the past 48 hours)  Basic metabolic panel     Status: Abnormal   Collection Time: 10/05/23  6:23 PM  Result Value Ref Range   Sodium 139 135 - 145 mmol/L   Potassium 4.2 3.5 - 5.1 mmol/L   Chloride 103 98 - 111 mmol/L   CO2 27 22 - 32 mmol/L   Glucose, Bld 82 70 - 99 mg/dL    Comment: Glucose reference range applies only to samples taken after fasting for at least 8 hours.   BUN 11 6 - 20 mg/dL   Creatinine, Ser 1.61 0.61 - 1.24 mg/dL   Calcium 8.7 (L) 8.9 - 10.3 mg/dL   GFR, Estimated >09 >60 mL/min    Comment: (NOTE) Calculated using the CKD-EPI Creatinine Equation (2021)    Anion gap 9 5 - 15    Comment: Performed at [facility], [address] [place], [place] [address]    Blood Alcohol level:  Lab Results  Component Value Date   ETH <10 10/03/2023   ETH <10 02/24/2023    Metabolic Disorder Labs: Lab Results  Component Value Date   HGBA1C 5.1 10/03/2023   MPG 99.67 10/03/2023   MPG 96.8 02/13/2023   No results found for: "PROLACTIN" Lab Results  Component Value Date   CHOL 157 10/03/2023   TRIG 76 10/03/2023   HDL 44 10/03/2023   CHOLHDL 3.6 10/03/2023   VLDL 15 10/03/2023   LDLCALC 98 10/03/2023   LDLCALC 77 02/13/2023     Physical Findings: AIMS:  , ,  ,  ,    CIWA:    COWS:     Musculoskeletal: Strength & Muscle Tone: Lying in bed Gait & Station: Laying in bed Patient leans: Laying in bed  Psychiatric Specialty Exam:  Presentation  General Appearance:  Disheveled (odorous)  Eye Contact: Fleeting  Speech: Slow  Speech Volume: Decreased  Handedness: Right   Mood and Affect  Mood: Anxious  Affect: Congruent; Constricted   Thought Process  Thought Processes: Linear  Descriptions of Associations:Intact  Orientation:Full (Time, Place and Person)  Thought Content:Paranoid Ideation  History of Schizophrenia/Schizoaffective disorder:Yes  Duration of Psychotic Symptoms:Greater than six months  Hallucinations:Hallucinations: Auditory  Ideas of Reference:Paranoia  Suicidal Thoughts:Suicidal Thoughts: No  Homicidal Thoughts:Homicidal Thoughts: No   Sensorium  Memory: Immediate Fair; Recent Fair; Remote Fair  Judgment: Fair  Insight: Fair   Art therapist  Concentration: Poor  Attention Span: Poor  Recall: Good  Fund of Knowledge: Fair  Language: Good   Psychomotor Activity  Psychomotor Activity: Psychomotor Activity: Normal   Assets  Assets: Desire for Improvement; Communication Skills   Sleep  Sleep: Sleep: Fair Number of Hours of Sleep: 9.5    Physical Exam: Physical Exam Vitals reviewed.  Constitutional:      General: He is not in acute distress.    Appearance: He is normal weight. He is not toxic-appearing.  Pulmonary:     Effort: Pulmonary effort is normal. No respiratory distress.  Neurological:     Mental Status: He is alert.     Motor: No weakness.     Gait: Gait normal.  Psychiatric:        Behavior: Behavior normal.        Judgment: Judgment normal.    Review of Systems  Constitutional:  Negative for chills and fever.  Cardiovascular:  Negative for chest  pain and palpitations.  Neurological:  Negative  for dizziness, tingling, tremors and headaches.  Psychiatric/Behavioral:  Positive for depression, hallucinations and substance abuse. Negative for memory loss and suicidal ideas. The patient is nervous/anxious. The patient does not have insomnia.   All other systems reviewed and are negative.  Blood pressure (!) 130/99, pulse 75, temperature 98.1 F (36.7 C), temperature source Oral, resp. rate 16, height 6' (1.829 m), weight 95.7 kg, SpO2 98%. Body mass index is 28.62 kg/m.   Treatment Plan Summary: Daily contact with patient to assess and evaluate symptoms and progress in treatment and Medication management     ASSESSMENT:   Diagnoses / Active Problems: Schizoaffective disorder, depressive type, in depressive episode Cannabis use disorder   PLAN:   Safety and Monitoring: -  VOLUNTARY  admission to inpatient psychiatric unit for safety, stabilization and treatment. - Daily contact with patient to assess and evaluate symptoms and progress in treatment - Patient's case to be discussed in multi-disciplinary team meeting -  Observation Level : q15 minute checks -  Vital signs:  q12 hours -  Precautions: suicide, elopement, and assault   2. Psychiatric Diagnoses and Treatment:     #Schizoaffective disorder, depressive type, in depressive episode #Cannabis use disorder - Prolixin 5 mg every 8 hours (liquid formulation).  Plan continues to be LAI before discharge. --Start Prozac 20 mg once daily for depressive symptoms.  He had previously responded well to Cymbalta, but due to high blood pressure, we will opt for an SSRI.  - Continue PRN Vistaril 50 mg 3 times daily for anxiety.  - The risks/benefits/side-effects/alternatives to this medication were discussed in detail with the patient and time was given for questions. The patient consents to medication trial.  - Metabolic profile and EKG monitoring obtained while on an atypical antipsychotic  BMI: 28.62 TSH: 0.641 Lipid panel:  LDL 98 WNL HbgA1c: 5.1 QTc: 462 - Encouraged patient to participate in unit milieu and in scheduled group therapies  - Short Term Goals: Ability to identify changes in lifestyle to reduce recurrence of condition will improve, Ability to verbalize feelings will improve, Ability to disclose and discuss suicidal ideas, Ability to demonstrate self-control will improve, and Ability to identify and develop effective coping behaviors will improve - Long Term Goals: Improvement in symptoms so as ready for discharge   Other PRNS: Atarax 50 mg 3 times daily, trazodone 50 mg nightly, Tylenol 650 mg every 6 hours, agitation   Other labs reviewed on admission:                3. Medical Issues Being Addressed:   #Hypokalemia resolved on repeat lab monitoring 1/30  High blood pressure-start amlodipine 5 mg once daily -Start clonidine 0.1 mg as needed for elevated blood pressure with parameters   4. Discharge Planning:    - Estimated discharge date: 5-6 more days  - Social work and case management to assist with discharge planning and identification of hospital follow-up needs prior to discharge. - Discharge concerns: Need to establish a safety plan; medication compliance and effectiveness. - Discharge goals: Return home with outpatient referrals for mental health follow-up including medication management/psychotherapy.   Phineas Inches, MD 10/06/2023, 12:44 PM  Total Time Spent in Direct Patient Care:  I personally spent 35 minutes on the unit in direct patient care. The direct patient care time included face-to-face time with the patient, reviewing the patient's chart, communicating with other professionals, and coordinating care. Greater than 50% of this time was spent  in counseling or coordinating care with the patient regarding goals of hospitalization, psycho-education, and discharge planning needs.   Phineas Inches, MD Psychiatrist '

## 2023-10-06 NOTE — [facility] Suicide Risk Assessment (Signed)
[facility] INPATIENT:  Family/Significant Other Suicide Prevention Education  Suicide Prevention Education:  Patient Refusal for Family/Significant Other Suicide Prevention Education: The patient William Romero has refused to provide written consent for family/significant other to be provided Family/Significant Other Suicide Prevention Education during admission and/or prior to discharge.  Physician notified.  Kathi Der 10/06/2023, 11:19 AM

## 2023-10-06 NOTE — Plan of Care (Signed)
  Problem: Education: Goal: Verbalization of understanding the information provided will improve Outcome: Progressing   Problem: Coping: Goal: Ability to demonstrate self-control will improve Outcome: Progressing   Problem: Safety: Goal: Periods of time without injury will increase Outcome: Progressing   

## 2023-10-06 NOTE — Progress Notes (Signed)
   10/06/23 0750  Psych Admission Type (Psych Patients Only)  Admission Status Voluntary  Psychosocial Assessment  Patient Complaints Anxiety;Depression  Eye Contact Fair  Facial Expression Flat  Affect Preoccupied  Speech Logical/coherent  Interaction Assertive  Motor Activity Fidgety  Appearance/Hygiene Unremarkable  Behavior Characteristics Cooperative;Calm  Mood Anxious  Thought Process  Coherency WDL  Content Preoccupation  Delusions None reported or observed  Perception Hallucinations  Hallucination Auditory  Judgment Impaired  Confusion None  Danger to Self  Current suicidal ideation? Denies  Agreement Not to Harm Self Yes  Description of Agreement Verbal  Danger to Others  Danger to Others None reported or observed

## 2023-10-06 NOTE — Group Note (Signed)
Recreation Therapy Group Note   Group Topic:Problem Solving  Group Date: 10/06/2023 Start Time: 0930 End Time: 1015 Facilitators: Lanyiah Brix-McCall, LRT,CTRS Location: [address] Dayroom   Group Topic: Problem Solving  Goal Area(s) Addresses:  Patient will effectively work in a team with other group members. Patient will verbalize importance of using appropriate problem solving techniques.  Patient will identify positive change associated with effective problem solving skills.   Intervention: Worksheets, Music  Activity: Patients were given two sheets of brain teasers. Patients were to work through each puzzle to come up with the answer. Patients had the option of working together or individually.    Education: Problem solving, Use of skills post discharge  Education Outcome: Acknowledges understanding/In group clarification offered/Needs additional education.    Affect/Mood: N/A   Participation Level: Did not attend    Clinical Observations/Individualized Feedback:    Plan: Continue to engage patient in RT group sessions 2-3x/week.   Ed Rayson-McCall, LRT,CTRS 10/06/2023 12:12 PM

## 2023-10-06 NOTE — Plan of Care (Signed)
  Problem: Education: Goal: Verbalization of understanding the information provided will improve Outcome: Progressing   Problem: Activity: Goal: Sleeping patterns will improve Outcome: Progressing   Problem: Coping: Goal: Ability to verbalize frustrations and anger appropriately will improve Outcome: Progressing Goal: Ability to demonstrate self-control will improve Outcome: Progressing   

## 2023-10-06 NOTE — Progress Notes (Signed)
   10/05/23 2300  Psych Admission Type (Psych Patients Only)  Admission Status Voluntary  Psychosocial Assessment  Patient Complaints Other (Comment) (Pt reported feeling tired as and anxious when he woke up.  He stated, "I'm trying to catch up on my rest."  Reported AH as ongoing and stated in awkward manner, "voices were probably earlier today just have the opportunity to say around others.")  Eye Contact Fair  Facial Expression Flat  Affect Preoccupied  Speech Logical/coherent  Interaction Assertive  Appearance/Hygiene Unremarkable  Behavior Characteristics Cooperative;Calm  Mood Preoccupied;Anxious (Pt reportedanxiety and requested medication for it.  Affect did not present as anxious.  Pt was encouraged to clean food trash and he immediately took care of it.  Declined to go to group and stated, "I'm suspicious of others.  The voices are worse.")  Thought Process  Coherency WDL  Content Preoccupation (Odd statement about voices)  Delusions None reported or observed  Perception Hallucinations  Hallucination Auditory ("They are disruptive and say bad words.")  Judgment Impaired  Confusion None  Danger to Self  Current suicidal ideation? Denies  Agreement Not to Harm Self Yes  Description of Agreement Verbal  Danger to Others  Danger to Others None reported or observed

## 2023-10-07 MED ORDER — FLUPHENAZINE HCL 5 MG/ML PO CONC
5.0000 mg | Freq: Once | ORAL | Status: AC
Start: 2023-10-07 — End: 2023-10-07
  Administered 2023-10-07: 5 mg via ORAL
  Filled 2023-10-07: qty 1

## 2023-10-07 MED ORDER — PROPRANOLOL HCL 10 MG PO TABS
10.0000 mg | ORAL_TABLET | Freq: Three times a day (TID) | ORAL | Status: DC
  Administered 2023-10-07 – 2023-10-08 (×3): 10 mg via ORAL
  Filled 2023-10-07 (×8): qty 1

## 2023-10-07 MED ORDER — FLUPHENAZINE HCL 5 MG/ML PO CONC
10.0000 mg | Freq: Two times a day (BID) | ORAL | Status: DC
  Administered 2023-10-07 – 2023-10-12 (×10): 10 mg via ORAL
  Filled 2023-10-07 (×14): qty 2

## 2023-10-07 NOTE — Progress Notes (Signed)
   10/06/23 2300  Psych Admission Type (Psych Patients Only)  Admission Status Voluntary  Psychosocial Assessment  Patient Complaints Anxiety;Depression  Eye Contact Fair  Facial Expression Flat  Affect Preoccupied  Speech Logical/coherent  Interaction Assertive  Motor Activity Fidgety  Appearance/Hygiene Unremarkable  Behavior Characteristics Cooperative;Calm  Mood Anxious  Thought Process  Coherency WDL  Content Preoccupation  Delusions None reported or observed  Perception Hallucinations  Hallucination Auditory  Judgment Impaired  Confusion None  Danger to Self  Current suicidal ideation? Denies  Agreement Not to Harm Self Yes  Description of Agreement Verbal  Danger to Others  Danger to Others None reported or observed

## 2023-10-07 NOTE — Progress Notes (Signed)
72 hour request for discharge signed by pt today 10/07/23 at 0925am

## 2023-10-07 NOTE — Plan of Care (Signed)
   Problem: Education: Goal: Knowledge of [facility] Education information/materials will improve Outcome: Progressing   Problem: Activity: Goal: Interest or engagement in activities will improve Outcome: Progressing   Problem: Coping: Goal: Ability to verbalize frustrations and anger appropriately will improve Outcome: Progressing

## 2023-10-07 NOTE — [facility] Group Notes (Signed)
[facility] Group Notes:  (Nursing)  Date:  10/07/2023  Time:  1400  Type of Therapy:  Psychoeducational Skills  Participation Level:  Did Not Attend  Shela Nevin 10/07/2023, 4:24 PM

## 2023-10-07 NOTE — Group Note (Signed)
Date:  10/07/2023 Time:  9:58 AM  Group Topic/Focus:  Goals Group:   The focus of this group is to help patients establish daily goals to achieve during treatment and discuss how the patient can incorporate goal setting into their daily lives to aide in recovery. Orientation:   The focus of this group is to educate the patient on the purpose and policies of crisis stabilization and provide a format to answer questions about their admission.  The group details unit policies and expectations of patients while admitted.    Participation Level:  Did Not Attend  Margaret Pyle 10/07/2023, 9:58 AM

## 2023-10-07 NOTE — Group Note (Signed)
Date:  10/07/2023 Time:  1:14 PM  Group Topic/Focus:  Emotional Education:   The focus of this group is to discuss what feelings/emotions are, and how they are experienced.    Participation Level:  Did Not Attend  Margaret Pyle 10/07/2023, 1:14 PM

## 2023-10-07 NOTE — Progress Notes (Signed)
   10/07/23 0747  Psych Admission Type (Psych Patients Only)  Admission Status Voluntary  Psychosocial Assessment  Patient Complaints Anxiety  Eye Contact Fair  Facial Expression Flat  Affect Flat  Speech Logical/coherent  Interaction Assertive  Motor Activity Other (Comment) (WDL)  Appearance/Hygiene Unremarkable  Behavior Characteristics Cooperative  Mood Anxious  Thought Process  Coherency WDL  Content WDL  Delusions None reported or observed  Perception Hallucinations  Hallucination Auditory  Judgment Impaired  Confusion None  Danger to Self  Current suicidal ideation? Denies  Agreement Not to Harm Self Yes  Description of Agreement verbal  Danger to Others  Danger to Others None reported or observed

## 2023-10-07 NOTE — Progress Notes (Signed)
[facility] MD Progress Note  10/07/2023 1:36 PM William Romero  MRN:  [id number]  Subjective:    William Romero is a 43 y.o. male  with a past psychiatric history of schizophrenia, reported ADHD, GAD, and episodes of depression. Patient initially arrived to [facility] on 1/28 for brief SI without plan, worsening auditory hallucinations and paranoia leading to job loss, and admitted to [facility] on 1/28 for impaired functioning and stabilization of acute on chronic psychiatric conditions. PMHx is noncontributory.   RN report: Prior to assessment today patient signed a 72-hour form endorsing feeling irritable and upset.  PRNs: Hydroxyzine 3 times, trazodone.  Patient also endorsed feeling very anxious.  On assessment today patient reports that he is feeling very irritable, anxious and restless.  Patient reports that these feelings started in the last 24 hours since taking the Prozac.  Patient reports that he feels like it is hard to control himself and this is what led to him signing a 72-hour.  Patient reports that he is feeling worse currently.  Patient reports that he feels like his thoughts are racing as well.  Patient denies SI and HI but endorses that he continues to have auditory hallucinations but denies CAH.  Patient also denies VH.  Patient reports that he is paranoid and endorses the belief that his family is plotting against him.  Patient reports that he sees patterns in his family's behavior and reports that because his uncle's name Fredrik Cove and another person in his family is named Harriett Sine, this means at the local mayor named Ethlyn Daniels is probably also implicated in out to get him.  Patient reports he is very worried that nobody will believe him.  Patient endorsed that although he has been taking all of the available prescribed hydroxyzine for 3 days for anxiety, he feels worse today.  Discussed with patient increasing his fluphenazine to address continued hallucinations and paranoia as well  as delusions and anxiety.  We will also discontinue Prozac as it appears to have worsened anxious feeling and increased racing thoughts.  We will also start propranolol to help address physical anxious feelings and possible akathisia endorsed by Prozac.  We will readdress 72-hour form tomorrow, when patient is hopefully feeling less anxious and irritable secondary to medication adjustments.  Patient endorsed signing form due to feelings he woke up with this morning being a bit more impulsive.  Principal Problem: Schizoaffective disorder (HCC) Diagnosis: Principal Problem:   Schizoaffective disorder (HCC)  Total Time spent with patient: 20 minutes  Past Psychiatric History:   Current psychiatrist: Previously seen by Dr. Park Pope 04/13/2023, did not follow up with  Current therapist: none. Previous reported psychiatric diagnoses: MDD, ?ADHD, schizophrenia, GAD Current psychiatric medications: none. Psychiatric medication history/compliance: Per chart review, poor with the exception of Adderall.  Full med trials include: adderall, bupropion, duloxetine, haloperidol, hydroxyzine (ineffective), lorazepam, olanzapine, ziprasidone, paliperidone, risperidone,  Psychiatric hospitalization(s): Approximately 6 stays over the past from 2012-2019.  Most recently, patient had 2 separate stays at [facility] in 2024, 6/11 - 6/19 and 6/22 - 7/1. Psychotherapy history: None Neuromodulation history: none. History of suicide (obtained from HPI): Reported 1 attempted OD in distant past, presently denied History of homicide or aggression (obtained in HPI): none.    Past Medical History:  Past Medical History:  Diagnosis Date   Paranoid schizophrenia (HCC) 10/03/2023   Schizophrenia [facility])     Past Surgical History:  Procedure Laterality Date   APPENDECTOMY     Family History: History  reviewed. No pertinent family history. Family Psychiatric  History: See H&P Social History:  Social History   Substance and  Sexual Activity  Alcohol Use Not Currently   Alcohol/week: 5.0 standard drinks of alcohol   Types: 5 Cans of beer per week     Social History   Substance and Sexual Activity  Drug Use No    Social History   Socioeconomic History   Marital status: Single    Spouse name: Not on file   Number of children: Not on file   Years of education: Not on file   Highest education level: Not on file  Occupational History   Not on file  Tobacco Use   Smoking status: Former    Current packs/day: 0.50    Types: Cigarettes   Smokeless tobacco: Never  Vaping Use   Vaping status: Never Used  Substance and Sexual Activity   Alcohol use: Not Currently    Alcohol/week: 5.0 standard drinks of alcohol    Types: 5 Cans of beer per week   Drug use: No   Sexual activity: Not on file  Other Topics Concern   Not on file  Social History Narrative   Not on file   Social Drivers of Health   Financial Resource Strain: Not on file  Food Insecurity: No Food Insecurity (10/03/2023)   Hunger Vital Sign    Worried About Running Out of Food in the Last Year: Never true    Ran Out of Food in the Last Year: Never true  Transportation Needs: No Transportation Needs (10/03/2023)   PRAPARE - Administrator, Civil Service (Medical): No    Lack of Transportation (Non-Medical): No  Physical Activity: Not on file  Stress: Not on file  Social Connections: Not on file   Additional Social History:                           Current Medications: Current Facility-Administered Medications  Medication Dose Route Frequency Provider Last Rate Last Admin   acetaminophen (TYLENOL) tablet 650 mg  650 mg Oral Q6H PRN Margaretmary Dys, MD       alum & mag hydroxide-simeth (MAALOX/MYLANTA) 200-200-20 MG/5ML suspension 30 mL  30 mL Oral Q4H PRN Margaretmary Dys, MD       amLODipine (NORVASC) tablet 5 mg  5 mg Oral Daily Massengill, Nathan, MD   5 mg at 10/07/23 0747    benztropine (COGENTIN) tablet 0.5 mg  0.5 mg Oral Daily Massengill, Harrold Donath, MD   0.5 mg at 10/07/23 0740   cloNIDine (CATAPRES) tablet 0.1 mg  0.1 mg Oral Q4H PRN Massengill, Harrold Donath, MD       haloperidol (HALDOL) tablet 5 mg  5 mg Oral TID PRN Margaretmary Dys, MD   5 mg at 10/05/23 5784   And   diphenhydrAMINE (BENADRYL) capsule 50 mg  50 mg Oral TID PRN Margaretmary Dys, MD   50 mg at 10/05/23 6962   haloperidol lactate (HALDOL) injection 5 mg  5 mg Intramuscular TID PRN Margaretmary Dys, MD       And   diphenhydrAMINE (BENADRYL) injection 50 mg  50 mg Intramuscular TID PRN Margaretmary Dys, MD       And   LORazepam (ATIVAN) injection 2 mg  2 mg Intramuscular TID PRN Margaretmary Dys, MD       haloperidol lactate (HALDOL) injection 10  mg  10 mg Intramuscular TID PRN Margaretmary Dys, MD       And   diphenhydrAMINE (BENADRYL) injection 50 mg  50 mg Intramuscular TID PRN Margaretmary Dys, MD       And   LORazepam (ATIVAN) injection 2 mg  2 mg Intramuscular TID PRN Margaretmary Dys, MD       fluPHENAZine (PROLIXIN) 5 MG/ML solution 10 mg  10 mg Oral BID Bobbye Morton, MD       fluPHENAZine (PROLIXIN) 5 MG/ML solution 5 mg  5 mg Oral Once Bobbye Morton, MD       hydrOXYzine (ATARAX) tablet 50 mg  50 mg Oral TID PRN Margaretmary Dys, MD   50 mg at 10/07/23 0740   magnesium hydroxide (MILK OF MAGNESIA) suspension 30 mL  30 mL Oral Daily PRN Margaretmary Dys, MD       propranolol (INDERAL) tablet 10 mg  10 mg Oral TID Bobbye Morton, MD   10 mg at 10/07/23 0454   traZODone (DESYREL) tablet 50 mg  50 mg Oral QHS PRN Margaretmary Dys, MD   50 mg at 10/06/23 2119    Lab Results:  Results for orders placed or performed during the hospital encounter of 10/03/23 (from the past 48 hours)  Basic metabolic panel     Status: Abnormal    Collection Time: 10/05/23  6:23 PM  Result Value Ref Range   Sodium 139 135 - 145 mmol/L   Potassium 4.2 3.5 - 5.1 mmol/L   Chloride 103 98 - 111 mmol/L   CO2 27 22 - 32 mmol/L   Glucose, Bld 82 70 - 99 mg/dL    Comment: Glucose reference range applies only to samples taken after fasting for at least 8 hours.   BUN 11 6 - 20 mg/dL   Creatinine, Ser 0.98 0.61 - 1.24 mg/dL   Calcium 8.7 (L) 8.9 - 10.3 mg/dL   GFR, Estimated >11 >91 mL/min    Comment: (NOTE) Calculated using the CKD-EPI Creatinine Equation (2021)    Anion gap 9 5 - 15    Comment: Performed at [facility], [address] [place], [place] [address]    Blood Alcohol level:  Lab Results  Component Value Date   ETH <10 10/03/2023   ETH <10 02/24/2023    Metabolic Disorder Labs: Lab Results  Component Value Date   HGBA1C 5.1 10/03/2023   MPG 99.67 10/03/2023   MPG 96.8 02/13/2023   No results found for: "PROLACTIN" Lab Results  Component Value Date   CHOL 157 10/03/2023   TRIG 76 10/03/2023   HDL 44 10/03/2023   CHOLHDL 3.6 10/03/2023   VLDL 15 10/03/2023   LDLCALC 98 10/03/2023   LDLCALC 77 02/13/2023    Physical Findings: AIMS:  , ,  ,  ,    CIWA:    COWS:     Musculoskeletal: Strength & Muscle Tone: Lying in bed Gait & Station: Laying in bed Patient leans: Laying in bed  Psychiatric Specialty Exam:  Presentation  General Appearance:  Casual  Eye Contact: Good (A bit intense)  Speech: Pressured  Speech Volume: Normal  Handedness: Right   Mood and Affect  Mood: Anxious; Irritable  Affect: Constricted   Thought Process  Thought Processes: Disorganized  Descriptions of Associations:-- (apophenia)  Orientation:Full (Time, Place and Person)  Thought Content:Illogical; Paranoid Ideation; Delusions  History of Schizophrenia/Schizoaffective disorder:Yes  Duration  of Psychotic Symptoms:Greater than six months  Hallucinations:Hallucinations:  Auditory Description of Auditory Hallucinations: "saying negativei things"  Ideas of Reference:Paranoia; Delusions  Suicidal Thoughts:Suicidal Thoughts: No  Homicidal Thoughts:Homicidal Thoughts: No   Sensorium  Memory: Immediate Fair; Recent Fair  Judgment: Poor  Insight: Shallow   Executive Functions  Concentration: Poor  Attention Span: Poor  Recall: Fair  Fund of Knowledge: Fair  Language: Fair   Psychomotor Activity  Psychomotor Activity: Psychomotor Activity: Increased; Restlessness   Assets  Assets: Desire for Improvement; Resilience   Sleep  Sleep: Sleep: Fair Number of Hours of Sleep: 8.5    Physical Exam: Physical Exam Vitals reviewed.  Constitutional:      General: He is not in acute distress.    Appearance: He is normal weight. He is not toxic-appearing.  Pulmonary:     Effort: Pulmonary effort is normal. No respiratory distress.  Neurological:     Mental Status: He is alert.     Motor: No weakness.     Gait: Gait normal.  Psychiatric:        Behavior: Behavior normal.        Judgment: Judgment normal.    Review of Systems  Constitutional:  Negative for chills and fever.  Cardiovascular:  Negative for chest pain and palpitations.  Neurological:  Negative for dizziness, tingling, tremors and headaches.  Psychiatric/Behavioral:  Positive for depression, hallucinations and substance abuse. Negative for memory loss and suicidal ideas. The patient is nervous/anxious. The patient does not have insomnia.   All other systems reviewed and are negative.  Blood pressure (!) 137/91, pulse 76, temperature 98.2 F (36.8 C), temperature source Oral, resp. rate 16, height 6' (1.829 m), weight 95.7 kg, SpO2 99%. Body mass index is 28.62 kg/m.   Treatment Plan Summary: Daily contact with patient to assess and evaluate symptoms and progress in treatment and Medication management     ASSESSMENT:   Diagnoses / Active  Problems: Schizoaffective disorder, depressive type, in depressive episode Cannabis use disorder   PLAN:   Safety and Monitoring: -  VOLUNTARY  admission to inpatient psychiatric unit for safety, stabilization and treatment. Signed a 72-hour on  @ [phone]. - Daily contact with patient to assess and evaluate symptoms and progress in treatment - Patient's case to be discussed in multi-disciplinary team meeting -  Observation Level : q15 minute checks -  Vital signs:  q12 hours -  Precautions: suicide, elopement, and assault   2. Psychiatric Diagnoses and Treatment:     #Schizoaffective disorder, depressive type, in depressive episode #Cannabis use disorder - Increase Prolixin to 10 mg twice daily, continue to plan for LAI prior to discharge --Discontinue Prozac due to patient endorsing increased restlessness, irritability and impulsivity - Start propranolol IR 10 mg 3 times daily for restlessness and increased anxiety - Continue PRN Vistaril 50 mg 3 times daily for anxiety.  - The risks/benefits/side-effects/alternatives to this medication were discussed in detail with the patient and time was given for questions. The patient consents to medication trial.  - Metabolic profile and EKG monitoring obtained while on an atypical antipsychotic  BMI: 28.62 TSH: 0.641 Lipid panel: LDL 98 WNL HbgA1c: 5.1 QTc: 462 - Encouraged patient to participate in unit milieu and in scheduled group therapies  - Short Term Goals: Ability to identify changes in lifestyle to reduce recurrence of condition will improve, Ability to verbalize feelings will improve, Ability to disclose and discuss suicidal ideas, Ability to demonstrate self-control will improve, and Ability to identify and develop  effective coping behaviors will improve - Long Term Goals: Improvement in symptoms so as ready for discharge   Other PRNS: Atarax 50 mg 3 times daily, trazodone 50 mg nightly, Tylenol 650 mg every 6 hours, agitation    Other labs reviewed on admission:                3. Medical Issues Being Addressed:   #Hypokalemia resolved on repeat lab monitoring 1/30  High blood pressure-start amlodipine 5 mg once daily -Continue clonidine 0.1 mg as needed for elevated blood pressure with parameters   4. Discharge Planning:    - Estimated discharge date: 5-6 more days  - Social work and case management to assist with discharge planning and identification of hospital follow-up needs prior to discharge. - Discharge concerns: Need to establish a safety plan; medication compliance and effectiveness. - Discharge goals: Return home with outpatient referrals for mental health follow-up including medication management/psychotherapy.   Bobbye Morton, MD 10/07/2023, 1:36 PM  Total Time Spent in Direct Patient Care:  I personally spent 20 minutes on the unit in direct patient care. The direct patient care time included face-to-face time with the patient, reviewing the patient's chart, communicating with other professionals, and coordinating care. Greater than 50% of this time was spent in counseling or coordinating care with the patient regarding goals of hospitalization, psycho-education, and discharge planning needs.   Eliseo Gum, MD Psychiatrist '

## 2023-10-07 NOTE — [facility] Group Notes (Signed)
[facility] Group Notes:  (Nursing/MHT/Case Management/Adjunct)  Date:  10/07/2023  Time:  9:47 PM  Type of Therapy:   Wrap-up group  Participation Level:  Did Not Attend  Participation Quality:    Affect:    Cognitive:    Insight:    Engagement in Group:    Modes of Intervention:    Summary of Progress/Problems: Pt refused to attend group. Writer provided him with a worksheet on coping skills.   William Romero 10/07/2023, 9:47 PM

## 2023-10-07 NOTE — Plan of Care (Signed)
   Problem: Education: Goal: Knowledge of [place] Education information/materials will improve Outcome: Progressing Goal: Emotional status will improve Outcome: Progressing Goal: Mental status will improve Outcome: Progressing Goal: Verbalization of understanding the information provided will improve Outcome: Progressing   Problem: Activity: Goal: Interest or engagement in activities will improve Outcome: Progressing Goal: Sleeping patterns will improve Outcome: Progressing   Problem: Coping: Goal: Ability to verbalize frustrations and anger appropriately will improve Outcome: Progressing Goal: Ability to demonstrate self-control will improve Outcome: Progressing   Problem: Health Behavior/Discharge Planning: Goal: Identification of resources available to assist in meeting health care needs will improve Outcome: Progressing Goal: Compliance with treatment plan for underlying cause of condition will improve Outcome: Progressing   Problem: Physical Regulation: Goal: Ability to maintain clinical measurements within normal limits will improve Outcome: Progressing   Problem: Safety: Goal: Periods of time without injury will increase Outcome: Progressing

## 2023-10-07 NOTE — Progress Notes (Signed)
   10/07/23 2000  Psych Admission Type (Psych Patients Only)  Admission Status Voluntary/72 hour document signed  Date 72 hour document signed  10/07/23  Time 72 hour document signed  0925  Psychosocial Assessment  Patient Complaints Anxiety  Eye Contact Fair  Facial Expression Animated  Affect Anxious  Speech Logical/coherent  Interaction Assertive  Motor Activity Slow  Appearance/Hygiene Unremarkable  Behavior Characteristics Cooperative  Mood Anxious  Thought Process  Coherency WDL  Content WDL  Delusions None reported or observed  Perception Hallucinations  Hallucination Auditory  Judgment Poor  Confusion None  Danger to Self  Current suicidal ideation? Denies  Description of Suicide Plan no plan  Agreement Not to Harm Self Yes  Description of Agreement verbal  Danger to Others  Danger to Others None reported or observed

## 2023-10-08 MED ORDER — DIVALPROEX SODIUM ER 500 MG PO TB24
500.0000 mg | ORAL_TABLET | Freq: Two times a day (BID) | ORAL | Status: DC
  Administered 2023-10-08 – 2023-10-09 (×3): 500 mg via ORAL
  Filled 2023-10-08 (×9): qty 1

## 2023-10-08 MED ORDER — LORAZEPAM 1 MG PO TABS
1.0000 mg | ORAL_TABLET | Freq: Two times a day (BID) | ORAL | Status: AC
Start: 2023-10-08 — End: 2023-10-08
  Administered 2023-10-08 (×2): 1 mg via ORAL
  Filled 2023-10-08 (×2): qty 1

## 2023-10-08 NOTE — [facility] Group Notes (Signed)
[facility] Group Notes:  (Nursing)  Date:  10/08/2023  Time:  1400  Type of Therapy:  Music Therapy  Participation Level:  Did Not Attend  Shela Nevin 10/08/2023, 5:00 PM

## 2023-10-08 NOTE — Progress Notes (Signed)
72 hour request for discharge rescinded. Morrie Sheldon, MD notified.

## 2023-10-08 NOTE — Progress Notes (Signed)
[facility] MD Progress Note  10/08/2023 2:54 PM William Romero  MRN:  [id number]  Subjective:    William Romero is a 43 y.o. male  with a past psychiatric history of schizophrenia, reported ADHD, GAD, and episodes of depression. Patient initially arrived to [facility] on 1/28 for brief SI without plan, worsening auditory hallucinations and paranoia leading to job loss, and admitted to [facility] on 1/28 for impaired functioning and stabilization of acute on chronic psychiatric conditions. PMHx is noncontributory.   RN report: Patient still endorsing a lot of anxiety.  PRNs: All hydroxyzine available and trazodone.  On assessment today patient reports that he did not sleep well last night.  Patient reports he feels restless.  Patient reports he tried to stay in his room yesterday, however every single time he tried to close his eyes to fall asleep he could not.  Patient reports he would get up to walk around to try and tire himself out but was unsuccessful.  Patient reports his thoughts still for like they are racing and he is still very anxious.  Patient reports that despite starting propranolol he has not really felt much improvement in terms of his anxiety and restlessness feeling.  Patient reports he still feels like he has a lot of energy that he is trying to keep inside of himself.  Patient reports he is feeling irritable but does not want to lash out at others and is keeping to himself.  Patient reports that he feels like he has so much energy to spend but has nowhere to spend it.  Patient denies SI and HI as well as VH but continues to endorse AH.  Patient reports the voices continue to say negative things to him and he feels like it is one male voice pretending to be others all having a conversation in his brain.  Patient reports that he also continues to feel paranoid that someone is out to get him specifically from his family.  Patient reports he is also starting to wonder if the male voice  is a Ambulance person in his brain.  Discussed with patient discontinuing propranolol and providing 2 doses of Ativan.  Also discussed starting Depakote.  Patient was unsure if he had been on this in the past.  Also discussed with patient the implications of his 72-hour form and how he appears to be not ready for discharge in the next 24 to 48 hours.  Patient endorsed he was willing to rescind the 72-hour as he did not want to be involuntarily committed if he was still not ready for discharge.  Patient endorsed that he was in agreement that he likely would not be ready in the next 1 to 2 days based of how he was feeling and wants to make sure that he is doing well before his discharge.   Principal Problem: Schizoaffective disorder, bipolar type (HCC) Diagnosis: Principal Problem:   Schizoaffective disorder, bipolar type (HCC)  Total Time spent with patient: 20 minutes  Past Psychiatric History:   Current psychiatrist: Previously seen by Dr. Park Pope 04/13/2023, did not follow up with  Current therapist: none. Previous reported psychiatric diagnoses: MDD, ?ADHD, schizophrenia, GAD Current psychiatric medications: none. Psychiatric medication history/compliance: Per chart review, poor with the exception of Adderall.  Full med trials include: adderall, bupropion, duloxetine, haloperidol, hydroxyzine (ineffective), lorazepam, olanzapine, ziprasidone, paliperidone, risperidone,  Psychiatric hospitalization(s): Approximately 6 stays over the past from 2012-2019.  Most recently, patient had 2 separate stays at [facility] in 2024, 6/11 -  6/19 and 6/22 - 7/1. Psychotherapy history: None Neuromodulation history: none. History of suicide (obtained from HPI): Reported 1 attempted OD in distant past, presently denied History of homicide or aggression (obtained in HPI): none.    Past Medical History:  Past Medical History:  Diagnosis Date   Paranoid schizophrenia (HCC) 10/03/2023   Schizophrenia (HCC)     Past  Surgical History:  Procedure Laterality Date   APPENDECTOMY     Family History: History reviewed. No pertinent family history. Family Psychiatric  History: See H&P Social History:  Social History   Substance and Sexual Activity  Alcohol Use Not Currently   Alcohol/week: 5.0 standard drinks of alcohol   Types: 5 Cans of beer per week     Social History   Substance and Sexual Activity  Drug Use No    Social History   Socioeconomic History   Marital status: Single    Spouse name: Not on file   Number of children: Not on file   Years of education: Not on file   Highest education level: Not on file  Occupational History   Not on file  Tobacco Use   Smoking status: Former    Current packs/day: 0.50    Types: Cigarettes   Smokeless tobacco: Never  Vaping Use   Vaping status: Never Used  Substance and Sexual Activity   Alcohol use: Not Currently    Alcohol/week: 5.0 standard drinks of alcohol    Types: 5 Cans of beer per week   Drug use: No   Sexual activity: Not on file  Other Topics Concern   Not on file  Social History Narrative   Not on file   Social Drivers of Health   Financial Resource Strain: Not on file  Food Insecurity: No Food Insecurity (10/03/2023)   Hunger Vital Sign    Worried About Running Out of Food in the Last Year: Never true    Ran Out of Food in the Last Year: Never true  Transportation Needs: No Transportation Needs (10/03/2023)   PRAPARE - Administrator, Civil Service (Medical): No    Lack of Transportation (Non-Medical): No  Physical Activity: Not on file  Stress: Not on file  Social Connections: Not on file   Additional Social History:                           Current Medications: Current Facility-Administered Medications  Medication Dose Route Frequency Provider Last Rate Last Admin   acetaminophen (TYLENOL) tablet 650 mg  650 mg Oral Q6H PRN Margaretmary Dys, MD       alum & mag  hydroxide-simeth (MAALOX/MYLANTA) 200-200-20 MG/5ML suspension 30 mL  30 mL Oral Q4H PRN Margaretmary Dys, MD       amLODipine (NORVASC) tablet 5 mg  5 mg Oral Daily Massengill, Nathan, MD   5 mg at 10/08/23 0757   benztropine (COGENTIN) tablet 0.5 mg  0.5 mg Oral Daily Massengill, Harrold Donath, MD   0.5 mg at 10/08/23 0757   cloNIDine (CATAPRES) tablet 0.1 mg  0.1 mg Oral Q4H PRN Massengill, Harrold Donath, MD       haloperidol (HALDOL) tablet 5 mg  5 mg Oral TID PRN Margaretmary Dys, MD   5 mg at 10/05/23 1610   And   diphenhydrAMINE (BENADRYL) capsule 50 mg  50 mg Oral TID PRN Margaretmary Dys, MD   50 mg at 10/05/23 [phone]  haloperidol lactate (HALDOL) injection 5 mg  5 mg Intramuscular TID PRN Margaretmary Dys, MD       And   diphenhydrAMINE (BENADRYL) injection 50 mg  50 mg Intramuscular TID PRN Margaretmary Dys, MD       And   LORazepam (ATIVAN) injection 2 mg  2 mg Intramuscular TID PRN Margaretmary Dys, MD       haloperidol lactate (HALDOL) injection 10 mg  10 mg Intramuscular TID PRN Margaretmary Dys, MD       And   diphenhydrAMINE (BENADRYL) injection 50 mg  50 mg Intramuscular TID PRN Margaretmary Dys, MD       And   LORazepam (ATIVAN) injection 2 mg  2 mg Intramuscular TID PRN Margaretmary Dys, MD       divalproex (DEPAKOTE ER) 24 hr tablet 500 mg  500 mg Oral BID Eliseo Gum B, MD   500 mg at 10/08/23 0945   fluPHENAZine (PROLIXIN) 5 MG/ML solution 10 mg  10 mg Oral BID Eliseo Gum B, MD   10 mg at 10/08/23 0757   hydrOXYzine (ATARAX) tablet 50 mg  50 mg Oral TID PRN Margaretmary Dys, MD   50 mg at 10/08/23 0617   LORazepam (ATIVAN) tablet 1 mg  1 mg Oral BID Eliseo Gum B, MD   1 mg at 10/08/23 0945   magnesium hydroxide (MILK OF MAGNESIA) suspension 30 mL  30 mL Oral Daily PRN Margaretmary Dys, MD       traZODone (DESYREL)  tablet 50 mg  50 mg Oral QHS PRN Margaretmary Dys, MD   50 mg at 10/07/23 2126    Lab Results:  No results found for this or any previous visit (from the past 48 hours).   Blood Alcohol level:  Lab Results  Component Value Date   ETH <10 10/03/2023   ETH <10 02/24/2023    Metabolic Disorder Labs: Lab Results  Component Value Date   HGBA1C 5.1 10/03/2023   MPG 99.67 10/03/2023   MPG 96.8 02/13/2023   No results found for: "PROLACTIN" Lab Results  Component Value Date   CHOL 157 10/03/2023   TRIG 76 10/03/2023   HDL 44 10/03/2023   CHOLHDL 3.6 10/03/2023   VLDL 15 10/03/2023   LDLCALC 98 10/03/2023   LDLCALC 77 02/13/2023    Physical Findings: AIMS:  , ,  ,  ,    CIWA:    COWS:     Musculoskeletal: Strength & Muscle Tone: Lying in bed Gait & Station: Laying in bed Patient leans: Laying in bed  Psychiatric Specialty Exam:  Presentation  General Appearance:  Casual  Eye Contact: Fair  Speech: Pressured  Speech Volume: Normal  Handedness: Right   Mood and Affect  Mood: Anxious  Affect: Constricted   Thought Process  Thought Processes: Goal Directed  Descriptions of Associations:Intact  Orientation:Full (Time, Place and Person)  Thought Content:Logical  History of Schizophrenia/Schizoaffective disorder:Yes  Duration of Psychotic Symptoms:Greater than six months  Hallucinations:Hallucinations: Auditory Description of Auditory Hallucinations: "saying negativei things"  Ideas of Reference:Paranoia  Suicidal Thoughts:Suicidal Thoughts: No  Homicidal Thoughts:Homicidal Thoughts: No   Sensorium  Memory: Immediate Good; Recent Good  Judgment: Fair  Insight: Shallow   Executive Functions  Concentration: Poor  Attention Span: Poor  Recall: Poor  Fund of Knowledge: Fair  Language: Fair   Psychomotor Activity  Psychomotor Activity: Psychomotor Activity: Normal   Assets  Assets: Desire for  Improvement; Resilience; Communication Skills   Sleep  Sleep: Sleep: Fair Number of Hours of Sleep: 5.75    Physical Exam: Physical Exam Vitals reviewed.  Constitutional:      General: He is not in acute distress.    Appearance: He is normal weight. He is not toxic-appearing.  Pulmonary:     Effort: Pulmonary effort is normal. No respiratory distress.  Neurological:     Mental Status: He is alert.     Motor: No weakness.     Gait: Gait normal.  Psychiatric:        Behavior: Behavior normal.        Judgment: Judgment normal.    Review of Systems  Constitutional:  Negative for chills and fever.  Cardiovascular:  Negative for chest pain and palpitations.  Neurological:  Negative for dizziness, tingling, tremors and headaches.  Psychiatric/Behavioral:  Positive for depression, hallucinations and substance abuse. Negative for memory loss and suicidal ideas. The patient is nervous/anxious. The patient does not have insomnia.   All other systems reviewed and are negative.  Blood pressure (!) 122/95, pulse 77, temperature 98.4 F (36.9 C), temperature source Oral, resp. rate 16, height 6' (1.829 m), weight 95.7 kg, SpO2 98%. Body mass index is 28.62 kg/m.   Treatment Plan Summary: Daily contact with patient to assess and evaluate symptoms and progress in treatment and Medication management   Based on patient presentation and upon further chart review it appears that patient has a history of bipolar disorder and manic episodes.  Patient was previously prescribed Depakote in 2014 for bipolar disorder.  We will be formally changing patient's diagnoses to schizoaffective disorder, bipolar type.  Patient continues to endorse feeling very restless but is trying to maintain composure on the unit, but it is obvious during assessment the patient is struggling with decision making and complex thinking due to his racing thoughts.  We will do 2 doses of Ativan as well as start Depakote for mood  stabilization.  ASSESSMENT:   Diagnoses / Active Problems: Schizoaffective disorder, bipolar type Cannabis use disorder   PLAN:   Safety and Monitoring: -  VOLUNTARY  admission to inpatient psychiatric unit for safety, stabilization and treatment. Rescinded 72-hour form at 1:02 PM on 10/08/2023 - Daily contact with patient to assess and evaluate symptoms and progress in treatment - Patient's case to be discussed in multi-disciplinary team meeting -  Observation Level : q15 minute checks -  Vital signs:  q12 hours -  Precautions: suicide, elopement, and assault   2. Psychiatric Diagnoses and Treatment:     #Schizoaffective disorder, type #Cannabis use disorder - Continue Prolixin  10 mg twice daily, continue to plan for LAI prior to discharge --Discontinue Prozac due to patient endorsing manic episode behaviors - Discontinue propranolol IR 10 mg 3 times daily - Start Ativan 1 mg twice daily for 2 doses - Start Depakote 500 mg twice daily for mood stabilization - Continue PRN Vistaril 50 mg 3 times daily for anxiety.  - The risks/benefits/side-effects/alternatives to this medication were discussed in detail with the patient and time was given for questions. The patient consents to medication trial.  - Metabolic profile and EKG monitoring obtained while on an atypical antipsychotic  BMI: 28.62 TSH: 0.641 Lipid panel: LDL 98 WNL HbgA1c: 5.1 QTc: 462 - Encouraged patient to participate in unit milieu and in scheduled group therapies  - Short Term Goals: Ability to identify changes in lifestyle to reduce recurrence of condition will improve,  Ability to verbalize feelings will improve, Ability to disclose and discuss suicidal ideas, Ability to demonstrate self-control will improve, and Ability to identify and develop effective coping behaviors will improve - Long Term Goals: Improvement in symptoms so as ready for discharge   Other PRNS: Atarax 50 mg 3 times daily, trazodone 50 mg  nightly, Tylenol 650 mg every 6 hours, agitation   Other labs reviewed on admission:                3. Medical Issues Being Addressed:   #Hypokalemia resolved on repeat lab monitoring 1/30  High blood pressure-start amlodipine 5 mg once daily -Continue clonidine 0.1 mg as needed for elevated blood pressure with parameters   4. Discharge Planning:    - Estimated discharge date: 5-6 more days  - Social work and case management to assist with discharge planning and identification of hospital follow-up needs prior to discharge. - Discharge concerns: Need to establish a safety plan; medication compliance and effectiveness. - Discharge goals: Return home with outpatient referrals for mental health follow-up including medication management/psychotherapy.   Bobbye Morton, MD 10/08/2023, 2:54 PM  Total Time Spent in Direct Patient Care:  I personally spent 20 minutes on the unit in direct patient care. The direct patient care time included face-to-face time with the patient, reviewing the patient's chart, communicating with other professionals, and coordinating care. Greater than 50% of this time was spent in counseling or coordinating care with the patient regarding goals of hospitalization, psycho-education, and discharge planning needs.   Eliseo Gum, MD Psychiatrist '

## 2023-10-08 NOTE — Group Note (Signed)
Date:  10/08/2023 Time:  8:55 PM  Group Topic/Focus:  Wrap-Up Group:   The focus of this group is to help patients review their daily goal of treatment and discuss progress on daily workbooks.    Participation Level:  Did Not Attend  Participation Quality:   n/a  Affect:   n/a  Cognitive:   n/a  Insight: None  Engagement in Group:   n/a  Modes of Intervention:   n/a  Additional Comments:  Patient did not attend wrap up group.   Kennieth Francois 10/08/2023, 8:55 PM

## 2023-10-08 NOTE — [facility] Group Notes (Signed)
Pt did not attend goals group. 

## 2023-10-08 NOTE — Progress Notes (Signed)
   10/08/23 0757  Psych Admission Type (Psych Patients Only)  Admission Status Voluntary/72 hour document signed  Date 72 hour document signed  10/07/23  Time 72 hour document signed  0925  Psychosocial Assessment  Patient Complaints Anxiety  Eye Contact Fair  Facial Expression Anxious  Affect Anxious  Speech Logical/coherent  Interaction Assertive  Motor Activity Other (Comment) (wdl)  Appearance/Hygiene Unremarkable  Behavior Characteristics Cooperative  Mood Anxious  Thought Process  Coherency WDL  Content WDL  Delusions None reported or observed  Perception WDL  Hallucination None reported or observed  Judgment Poor  Confusion None  Danger to Self  Current suicidal ideation? Denies  Agreement Not to Harm Self Yes  Description of Agreement verbal  Danger to Others  Danger to Others None reported or observed

## 2023-10-08 NOTE — Group Note (Signed)
[facility] [name] Group Therapy Note    Group Date: 10/08/2023 Start Time: 1000 End Time: 1120  Type of Therapy and Topic:  Group Therapy:  Change/ Overcoming Obstacles  Participation Level:  [facility] PARTICIPATION LEVEL: Did Not Attend  Mood:  Description of Group:   In this group patients will be encouraged to explore what they see as obstacles to their own wellness and recovery. They will be guided to discuss their thoughts, feelings, and behaviors related to these obstacles. The group will process together ways to cope with barriers, with attention given to specific choices patients can make. Each patient will be challenged to identify changes they are motivated to make in order to overcome their obstacles. This group will be process-oriented, with patients participating in exploration of their own experiences as well as giving and receiving support and challenge from other group members.  Therapeutic Goals: 1. Patient will identify personal and current obstacles as they relate to admission. 2. Patient will identify barriers that currently interfere with their wellness or overcoming obstacles.  3. Patient will identify feelings, thought process and behaviors related to these barriers. 4. Patient will identify two changes they are willing to make to overcome these obstacles:    Summary of Patient Progress   Pt was invited but did not attend   Therapeutic Modalities:   Cognitive Behavioral Therapy Solution Focused Therapy Motivational Interviewing Relapse Prevention Therapy   Steffanie Dunn, [name]

## 2023-10-08 NOTE — Progress Notes (Signed)
   10/08/23 2229  Psych Admission Type (Psych Patients Only)  Admission Status Voluntary/72 hour document signed  Date 72 hour document signed  10/07/23  Time 72 hour document signed  0925  Psychosocial Assessment  Patient Complaints Anxiety  Eye Contact Fair  Facial Expression Animated  Affect Anxious  Speech Logical/coherent  Interaction Assertive  Motor Activity Slow  Appearance/Hygiene Unremarkable  Behavior Characteristics Cooperative;Appropriate to situation  Mood Anxious  Thought Process  Coherency WDL  Content WDL  Delusions None reported or observed  Perception Hallucinations  Hallucination Auditory  Judgment Poor  Confusion None  Danger to Self  Current suicidal ideation? Denies  Description of Suicide Plan no plan  Agreement Not to Harm Self Yes  Description of Agreement verbal  Danger to Others  Danger to Others None reported or observed

## 2023-10-09 ENCOUNTER — Encounter (HOSPITAL_COMMUNITY): Payer: Self-pay

## 2023-10-09 DIAGNOSIS — F251 Schizoaffective disorder, depressive type: Secondary | ICD-10-CM

## 2023-10-09 MED ORDER — DIVALPROEX SODIUM ER 500 MG PO TB24
1000.0000 mg | ORAL_TABLET | Freq: Every day | ORAL | Status: DC
Start: 2023-10-10 — End: 2023-10-12
  Administered 2023-10-10 – 2023-10-11 (×2): 1000 mg via ORAL
  Filled 2023-10-09 (×4): qty 2

## 2023-10-09 MED ORDER — PROPRANOLOL HCL 10 MG PO TABS
10.0000 mg | ORAL_TABLET | Freq: Two times a day (BID) | ORAL | Status: DC
Start: 2023-10-09 — End: 2023-10-11
  Administered 2023-10-09 – 2023-10-11 (×5): 10 mg via ORAL
  Filled 2023-10-09 (×8): qty 1

## 2023-10-09 MED ORDER — DIVALPROEX SODIUM ER 500 MG PO TB24
500.0000 mg | ORAL_TABLET | Freq: Two times a day (BID) | ORAL | Status: AC
  Administered 2023-10-09: 500 mg via ORAL
  Filled 2023-10-09: qty 1

## 2023-10-09 NOTE — Progress Notes (Signed)
[facility] MD Progress Note  10/09/2023 2:38 PM William Romero  MRN:  [id number]  Subjective:    William Romero is a 43 y.o. male  with a past psychiatric history of schizophrenia, reported ADHD, GAD, and episodes of depression. Patient initially arrived to [facility] on 1/28 for brief SI without plan, worsening auditory hallucinations and paranoia leading to job loss, and admitted to [facility] on 1/28 for impaired functioning and stabilization of acute on chronic psychiatric conditions. PMHx is noncontributory.   On exam today, patient reports that his auditory hallucinations are less loud, less frequent, and overall improved over the last 1 to 2 days.  He reports feeling restless, needing and pacing crawling out of his skin.  We discussed was a side effect of antipsychotic medication called akathisia.  We discussed restarting propranolol for this.  He reports liking the new medication, Depakote for mood stability, and we discussed this will require valproic acid level lab on Thursday morning, the patient is agreeable to this.  Otherwise he reports that his mood is not depressed.  Reports that anxiety is elevated due to feeling the need to move in pace around.  Reports that sleep is okay.  Concentration is better.  Appetite is better.  Denies any SI or HI.  Denies any paranoia today.  No obvious delusions on my exam today.  Per nursing he rescinded the 72-hour request for discharge yesterday.  We discussed discharge at the earliest will be Thursday, after his valproic acid level.  We also discussed that the Prolixin comes in LAI, and the patient is agreeable to this.  We discussed giving the LAI, once the akathisia is more under control.    Principal Problem: Schizoaffective disorder, bipolar type (HCC) Diagnosis: Principal Problem:   Schizoaffective disorder, bipolar type (HCC) Active Problems:   Schizoaffective disorder, depressive type (HCC)  Total Time spent with patient: 20 minutes  Past  Psychiatric History:   Current psychiatrist: Previously seen by Dr. Park Pope 04/13/2023, did not follow up with  Current therapist: none. Previous reported psychiatric diagnoses: MDD, ?ADHD, schizophrenia, GAD Current psychiatric medications: none. Psychiatric medication history/compliance: Per chart review, poor with the exception of Adderall.  Full med trials include: adderall, bupropion, duloxetine, haloperidol, hydroxyzine (ineffective), lorazepam, olanzapine, ziprasidone, paliperidone, risperidone,  Psychiatric hospitalization(s): Approximately 6 stays over the past from 2012-2019.  Most recently, patient had 2 separate stays at [facility] in 2024, 6/11 - 6/19 and 6/22 - 7/1. Psychotherapy history: None Neuromodulation history: none. History of suicide (obtained from HPI): Reported 1 attempted OD in distant past, presently denied History of homicide or aggression (obtained in HPI): none.    Past Medical History:  Past Medical History:  Diagnosis Date   Paranoid schizophrenia (HCC) 10/03/2023   Schizophrenia (HCC)     Past Surgical History:  Procedure Laterality Date   APPENDECTOMY     Family History: History reviewed. No pertinent family history. Family Psychiatric  History: See H&P Social History:  Social History   Substance and Sexual Activity  Alcohol Use Not Currently   Alcohol/week: 5.0 standard drinks of alcohol   Types: 5 Cans of beer per week     Social History   Substance and Sexual Activity  Drug Use No    Social History   Socioeconomic History   Marital status: Single    Spouse name: Not on file   Number of children: Not on file   Years of education: Not on file   Highest education level: Not on file  Occupational History   Not on file  Tobacco Use   Smoking status: Former    Current packs/day: 0.50    Types: Cigarettes   Smokeless tobacco: Never  Vaping Use   Vaping status: Never Used  Substance and Sexual Activity   Alcohol use: Not Currently     Alcohol/week: 5.0 standard drinks of alcohol    Types: 5 Cans of beer per week   Drug use: No   Sexual activity: Not on file  Other Topics Concern   Not on file  Social History Narrative   Not on file   Social Drivers of Health   Financial Resource Strain: Not on file  Food Insecurity: No Food Insecurity (10/03/2023)   Hunger Vital Sign    Worried About Running Out of Food in the Last Year: Never true    Ran Out of Food in the Last Year: Never true  Transportation Needs: No Transportation Needs (10/03/2023)   PRAPARE - Administrator, Civil Service (Medical): No    Lack of Transportation (Non-Medical): No  Physical Activity: Not on file  Stress: Not on file  Social Connections: Not on file   Additional Social History:                           Current Medications: Current Facility-Administered Medications  Medication Dose Route Frequency Provider Last Rate Last Admin   acetaminophen (TYLENOL) tablet 650 mg  650 mg Oral Q6H PRN Margaretmary Dys, MD   650 mg at 10/09/23 4098   alum & mag hydroxide-simeth (MAALOX/MYLANTA) 200-200-20 MG/5ML suspension 30 mL  30 mL Oral Q4H PRN Margaretmary Dys, MD       amLODipine (NORVASC) tablet 5 mg  5 mg Oral Daily Sedrick Tober, MD   5 mg at 10/09/23 0754   benztropine (COGENTIN) tablet 0.5 mg  0.5 mg Oral Daily Donyea Beverlin, Harrold Donath, MD   0.5 mg at 10/09/23 0754   cloNIDine (CATAPRES) tablet 0.1 mg  0.1 mg Oral Q4H PRN Rifky Lapre, Harrold Donath, MD       haloperidol (HALDOL) tablet 5 mg  5 mg Oral TID PRN Margaretmary Dys, MD   5 mg at 10/05/23 1191   And   diphenhydrAMINE (BENADRYL) capsule 50 mg  50 mg Oral TID PRN Margaretmary Dys, MD   50 mg at 10/05/23 4782   haloperidol lactate (HALDOL) injection 5 mg  5 mg Intramuscular TID PRN Margaretmary Dys, MD       And   diphenhydrAMINE (BENADRYL) injection 50 mg  50 mg Intramuscular TID PRN Margaretmary Dys, MD       And   LORazepam (ATIVAN) injection 2 mg  2 mg Intramuscular TID PRN Margaretmary Dys, MD       haloperidol lactate (HALDOL) injection 10 mg  10 mg Intramuscular TID PRN Margaretmary Dys, MD       And   diphenhydrAMINE (BENADRYL) injection 50 mg  50 mg Intramuscular TID PRN Margaretmary Dys, MD       And   LORazepam (ATIVAN) injection 2 mg  2 mg Intramuscular TID PRN Margaretmary Dys, MD       divalproex (DEPAKOTE ER) 24 hr tablet 500 mg  500 mg Oral BID Eliseo Gum B, MD   500 mg at 10/09/23 0754   fluPHENAZine (PROLIXIN) 5 MG/ML solution 10 mg  10 mg  Oral BID Eliseo Gum B, MD   10 mg at 10/09/23 1191   hydrOXYzine (ATARAX) tablet 50 mg  50 mg Oral TID PRN Margaretmary Dys, MD   50 mg at 10/09/23 4782   magnesium hydroxide (MILK OF MAGNESIA) suspension 30 mL  30 mL Oral Daily PRN Margaretmary Dys, MD       propranolol (INDERAL) tablet 10 mg  10 mg Oral Q12H Kadeja Granada, Harrold Donath, MD   10 mg at 10/09/23 1205   traZODone (DESYREL) tablet 50 mg  50 mg Oral QHS PRN Margaretmary Dys, MD   50 mg at 10/08/23 2111    Lab Results:  No results found for this or any previous visit (from the past 48 hours).   Blood Alcohol level:  Lab Results  Component Value Date   ETH <10 10/03/2023   ETH <10 02/24/2023    Metabolic Disorder Labs: Lab Results  Component Value Date   HGBA1C 5.1 10/03/2023   MPG 99.67 10/03/2023   MPG 96.8 02/13/2023   No results found for: "PROLACTIN" Lab Results  Component Value Date   CHOL 157 10/03/2023   TRIG 76 10/03/2023   HDL 44 10/03/2023   CHOLHDL 3.6 10/03/2023   VLDL 15 10/03/2023   LDLCALC 98 10/03/2023   LDLCALC 77 02/13/2023    Physical Findings: AIMS:  , ,  ,  ,    CIWA:    COWS:     Musculoskeletal: Strength & Muscle Tone: WNL Gait & Station: William Romero Patient leans: None  Psychiatric Specialty  Exam:  Presentation  General Appearance:  Casual  Eye Contact: Fair  Speech: Normal  Speech Volume: Normal  Handedness: Right   Mood and Affect  Mood: Anxious  Affect: Constricted   Thought Process  Thought Processes: Goal Directed  Descriptions of Associations:Intact  Orientation:Full (Time, Place and Person)  Thought Content:Logical  History of Schizophrenia/Schizoaffective disorder:Yes  Duration of Psychotic Symptoms:Greater than six months  Hallucinations:Hallucinations: Auditory  Ideas of Reference: Denies paranoia  Suicidal Thoughts:Suicidal Thoughts: No  Homicidal Thoughts:Homicidal Thoughts: No   Sensorium  Memory: Immediate Good; Recent Good  Judgment: Fair  Insight: Shallow   Executive Functions  Concentration: Poor  Attention Span: Poor  Recall: Poor  Fund of Knowledge: Fair  Language: Fair   Psychomotor Activity  Psychomotor Activity: Psychomotor Activity: Akathisia   Assets  Assets: Desire for Improvement; Resilience; Communication Skills   Sleep  Sleep: Sleep: Fair Number of Hours of Sleep: 5.75    Physical Exam: Physical Exam Vitals reviewed.  Constitutional:      General: He is not in acute distress.    Appearance: He is normal weight. He is not toxic-appearing.  Pulmonary:     Effort: Pulmonary effort is normal. No respiratory distress.  Neurological:     Mental Status: He is alert.     Motor: No weakness.     Gait: Gait normal.  Psychiatric:        Behavior: Behavior normal.        Judgment: Judgment normal.    Review of Systems  Constitutional:  Negative for chills and fever.  Cardiovascular:  Negative for chest pain and palpitations.  Neurological:  Negative for dizziness, tingling, tremors and headaches.  Psychiatric/Behavioral:  Positive for depression, hallucinations and substance abuse. Negative for memory loss and suicidal ideas. The patient is nervous/anxious. The patient does  not have insomnia.   All other systems reviewed and are negative.  Blood pressure 124/88, pulse 82, temperature  97.8 F (36.6 C), temperature source Oral, resp. rate 16, height 6' (1.829 m), weight 95.7 kg, SpO2 97%. Body mass index is 28.62 kg/m.   Treatment Plan Summary: Daily contact with patient to assess and evaluate symptoms and progress in treatment and Medication management    ASSESSMENT:   Diagnoses / Active Problems: Schizoaffective disorder, bipolar type Cannabis use disorder Akathisia   PLAN:   Safety and Monitoring: -  VOLUNTARY  admission to inpatient psychiatric unit for safety, stabilization and treatment. Rescinded 72-hour form at 1:02 PM on 10/08/2023 - Daily contact with patient to assess and evaluate symptoms and progress in treatment - Patient's case to be discussed in multi-disciplinary team meeting -  Observation Level : q15 minute checks -  Vital signs:  q12 hours -  Precautions: suicide, elopement, and assault   2. Psychiatric Diagnoses and Treatment:     #Schizoaffective disorder, type #Cannabis use disorder - Continue Prolixin  10 mg twice daily, continue to plan for LAI prior to discharge.  Monitor akathisia -- Previously discontinued Prozac  - Restart propranolol IR 10 mg 3 times daily for akathisia -Consolidate Depakote from ER 500 mg bid -> to ER 1000 mg at bedtime - for mood stabilization.  Depakote level ordered for Thursday morning.  - Continue PRN Vistaril 50 mg 3 times daily for anxiety.  - The risks/benefits/side-effects/alternatives to this medication were discussed in detail with the patient and time was given for questions. The patient consents to medication trial.  - Metabolic profile and EKG monitoring obtained while on an atypical antipsychotic  BMI: 28.62 TSH: 0.641 Lipid panel: LDL 98 WNL HbgA1c: 5.1 QTc: 462 - Encouraged patient to participate in unit milieu and in scheduled group therapies  - Short Term Goals: Ability to  identify changes in lifestyle to reduce recurrence of condition will improve, Ability to verbalize feelings will improve, Ability to disclose and discuss suicidal ideas, Ability to demonstrate self-control will improve, and Ability to identify and develop effective coping behaviors will improve - Long Term Goals: Improvement in symptoms so as ready for discharge   Other PRNS: Atarax 50 mg 3 times daily, trazodone 50 mg nightly, Tylenol 650 mg every 6 hours, agitation   Other labs reviewed on admission:                3. Medical Issues Being Addressed:   #Hypokalemia resolved on repeat lab monitoring 1/30  High blood pressure-start amlodipine 5 mg once daily -Continue clonidine 0.1 mg as needed for elevated blood pressure with parameters  4. Discharge Planning:    - Estimated discharge date: 4-5 more days  - Social work and case management to assist with discharge planning and identification of hospital follow-up needs prior to discharge. - Discharge concerns: Need to establish a safety plan; medication compliance and effectiveness. - Discharge goals: Return home with outpatient referrals for mental health follow-up including medication management/psychotherapy.   Phineas Inches, MD 10/09/2023, 2:38 PM  Total Time Spent in Direct Patient Care:  I personally spent 35 minutes on the unit in direct patient care. The direct patient care time included face-to-face time with the patient, reviewing the patient's chart, communicating with other professionals, and coordinating care. Greater than 50% of this time was spent in counseling or coordinating care with the patient regarding goals of hospitalization, psycho-education, and discharge planning needs.   Phineas Inches, MD Psychiatrist

## 2023-10-09 NOTE — Group Note (Signed)
Recreation Therapy Group Note   Group Topic:Stress Management  Group Date: 10/09/2023 Start Time: 0935 End Time: 1000 Facilitators: Maisey Deandrade-McCall, LRT,CTRS Location: 300 Hall Dayroom   Group Topic: Stress Management  Goal Area(s) Addresses:  Patient will identify positive stress management techniques. Patient will identify benefits of using stress management post d/c.  Intervention: Insight Timer App  Activity : Meditation. LRT engaged with patients about meditation and the benefits of it. LRT then played a meditation that focused on getting prepared for the day and being able to speak positive affirmations to themselves when negative thoughts start to creep in. Patients were encouraged to relax, get in a comfortable position, focus on their breathing and be attentive to the meditation.   Education:  Stress Management, Discharge Planning.   Education Outcome: Acknowledges Education   Affect/Mood: N/A   Participation Level: Did not attend    Clinical Observations/Individualized Feedback:      Plan: Continue to engage patient in RT group sessions 2-3x/week.   Doreatha Offer-McCall, LRT,CTRS 10/09/2023 11:54 AM

## 2023-10-09 NOTE — Progress Notes (Signed)
   10/09/23 0900  Psych Admission Type (Psych Patients Only)  Admission Status Voluntary  Psychosocial Assessment  Patient Complaints Anxiety  Eye Contact Fair  Facial Expression Animated  Affect Anxious  Speech Logical/coherent  Interaction Assertive  Motor Activity Other (Comment) (WDL)  Appearance/Hygiene Unremarkable  Behavior Characteristics Cooperative;Anxious  Mood Anxious  Thought Process  Coherency WDL  Content WDL  Delusions None reported or observed  Perception WDL  Hallucination None reported or observed  Judgment Poor  Confusion None  Danger to Self  Current suicidal ideation? Denies  Agreement Not to Harm Self Yes  Description of Agreement verbal  Danger to Others  Danger to Others None reported or observed

## 2023-10-09 NOTE — BH IP Treatment Plan (Signed)
Interdisciplinary Treatment and Diagnostic Plan Update  10/09/2023 Time of Session: 1035 - UPDATE William Romero MRN: [id number]  Principal Diagnosis: Schizoaffective disorder, bipolar type (HCC)  Secondary Diagnoses: Principal Problem:   Schizoaffective disorder, bipolar type (HCC)   Current Medications:  Current Facility-Administered Medications  Medication Dose Route Frequency Provider Last Rate Last Admin   acetaminophen (TYLENOL) tablet 650 mg  650 mg Oral Q6H PRN Margaretmary Dys, MD   650 mg at 10/09/23 7829   alum & mag hydroxide-simeth (MAALOX/MYLANTA) 200-200-20 MG/5ML suspension 30 mL  30 mL Oral Q4H PRN Margaretmary Dys, MD       amLODipine (NORVASC) tablet 5 mg  5 mg Oral Daily Massengill, Nathan, MD   5 mg at 10/09/23 0754   benztropine (COGENTIN) tablet 0.5 mg  0.5 mg Oral Daily Massengill, Harrold Donath, MD   0.5 mg at 10/09/23 5621   cloNIDine (CATAPRES) tablet 0.1 mg  0.1 mg Oral Q4H PRN Massengill, Harrold Donath, MD       haloperidol (HALDOL) tablet 5 mg  5 mg Oral TID PRN Margaretmary Dys, MD   5 mg at 10/05/23 3086   And   diphenhydrAMINE (BENADRYL) capsule 50 mg  50 mg Oral TID PRN Margaretmary Dys, MD   50 mg at 10/05/23 5784   haloperidol lactate (HALDOL) injection 5 mg  5 mg Intramuscular TID PRN Margaretmary Dys, MD       And   diphenhydrAMINE (BENADRYL) injection 50 mg  50 mg Intramuscular TID PRN Margaretmary Dys, MD       And   LORazepam (ATIVAN) injection 2 mg  2 mg Intramuscular TID PRN Margaretmary Dys, MD       haloperidol lactate (HALDOL) injection 10 mg  10 mg Intramuscular TID PRN Margaretmary Dys, MD       And   diphenhydrAMINE (BENADRYL) injection 50 mg  50 mg Intramuscular TID PRN Margaretmary Dys, MD       And   LORazepam (ATIVAN) injection 2 mg  2 mg Intramuscular TID PRN Margaretmary Dys, MD        divalproex (DEPAKOTE ER) 24 hr tablet 500 mg  500 mg Oral BID Eliseo Gum B, MD   500 mg at 10/09/23 0754   fluPHENAZine (PROLIXIN) 5 MG/ML solution 10 mg  10 mg Oral BID Eliseo Gum B, MD   10 mg at 10/09/23 6962   hydrOXYzine (ATARAX) tablet 50 mg  50 mg Oral TID PRN Margaretmary Dys, MD   50 mg at 10/09/23 9528   magnesium hydroxide (MILK OF MAGNESIA) suspension 30 mL  30 mL Oral Daily PRN Margaretmary Dys, MD       traZODone (DESYREL) tablet 50 mg  50 mg Oral QHS PRN Margaretmary Dys, MD   50 mg at 10/08/23 2111   PTA Medications: Medications Prior to Admission  Medication Sig Dispense Refill Last Dose/Taking   amphetamine-dextroamphetamine (ADDERALL) 20 MG tablet Take 20 mg by mouth 2 (two) times daily.      risperiDONE (RISPERDAL) 0.5 MG tablet Take 1 tablet (0.5 mg total) by mouth 2 (two) times daily.       Patient Stressors: Health problems    Patient Strengths: Ability for insight   Treatment Modalities: Medication Management, Group therapy, Case management,  1 to 1 session with clinician, Psychoeducation, Recreational therapy.   Physician Treatment Plan for Primary Diagnosis: Schizoaffective disorder,  bipolar type (HCC) Long Term Goal(s):     Short Term Goals: Ability to identify changes in lifestyle to reduce recurrence of condition will improve Ability to verbalize feelings will improve Ability to disclose and discuss suicidal ideas Ability to demonstrate self-control will improve Ability to identify and develop effective coping behaviors will improve  Medication Management: Evaluate patient's response, side effects, and tolerance of medication regimen.  Therapeutic Interventions: 1 to 1 sessions, Unit Group sessions and Medication administration.  Evaluation of Outcomes: Progressing  Physician Treatment Plan for Secondary Diagnosis: Principal Problem:   Schizoaffective disorder, bipolar type (HCC)  Long Term Goal(s):      Short Term Goals: Ability to identify changes in lifestyle to reduce recurrence of condition will improve Ability to verbalize feelings will improve Ability to disclose and discuss suicidal ideas Ability to demonstrate self-control will improve Ability to identify and develop effective coping behaviors will improve     Medication Management: Evaluate patient's response, side effects, and tolerance of medication regimen.  Therapeutic Interventions: 1 to 1 sessions, Unit Group sessions and Medication administration.  Evaluation of Outcomes: Progressing   RN Treatment Plan for Primary Diagnosis: Schizoaffective disorder, bipolar type (HCC) Long Term Goal(s): Knowledge of disease and therapeutic regimen to maintain health will improve  Short Term Goals: Ability to remain free from injury will improve, Ability to participate in decision making will improve, and Ability to verbalize feelings will improve  Medication Management: RN will administer medications as ordered by provider, will assess and evaluate patient's response and provide education to patient for prescribed medication. RN will report any adverse and/or side effects to prescribing provider.  Therapeutic Interventions: 1 on 1 counseling sessions, Psychoeducation, Medication administration, Evaluate responses to treatment, Monitor vital signs and CBGs as ordered, Perform/monitor CIWA, COWS, AIMS and Fall Risk screenings as ordered, Perform wound care treatments as ordered.  Evaluation of Outcomes: Progressing   [name] Treatment Plan for Primary Diagnosis: Schizoaffective disorder, bipolar type (HCC) Long Term Goal(s): Safe transition to appropriate next level of care at discharge, Engage patient in therapeutic group addressing interpersonal concerns.  Short Term Goals: Engage patient in aftercare planning with referrals and resources, Increase ability to appropriately verbalize feelings, and Increase emotional  regulation  Therapeutic Interventions: Assess for all discharge needs, 1 to 1 time with Social worker, Explore available resources and support systems, Assess for adequacy in community support network, Educate family and significant other(s) on suicide prevention, Complete Psychosocial Assessment, Interpersonal group therapy.  Evaluation of Outcomes: Progressing   Progress in Treatment: Attending groups: No. Participating in groups: No. Taking medication as prescribed: Yes. Toleration medication: Yes. Family/Significant other contact made: No, patient declined consents. Patient understands diagnosis: Yes. Discussing patient identified problems/goals with staff: Yes. Medical problems stabilized or resolved: No. Denies suicidal/homicidal ideation: Yes. Issues/concerns per patient self-inventory: No. Other: None  New problem(s) identified: No, Describe:  None   New Short Term/Long Term Goal(s): medication stabilization, elimination of SI thoughts, development of comprehensive mental wellness plan.    Patient Goals: "I've got some very destructive voices and I want your help with that"   Discharge Plan or Barriers: Patient recently admitted. CSW will continue to follow and assess for appropriate referrals and possible discharge planning.      Reason for Continuation of Hospitalization: Anxiety Medication stabilization Other; describe psychosis   Estimated Length of Stay: 3-5 Days  Last 3 [place] Suicide Severity Risk Score: Flowsheet Row [facility] Most recent reading  at 10/03/2023  5:36 PM ED from 10/03/2023 in [facility] Most recent reading at 10/03/2023  2:15 PM Office Visit from 04/03/2023 in [facility] Most recent reading at 04/03/2023  2:21 PM  C-SSRS RISK CATEGORY No Risk Error: Q3, 4, or 5 should not be populated when Q2 is No Error: Q3, 4, or 5  should not be populated when Q2 is No       Last PHQ 2/9 Scores:    04/20/2023    4:08 PM 04/03/2023    2:21 PM 03/30/2023   10:35 AM  Depression screen PHQ 2/9  Decreased Interest 1 3 1   Down, Depressed, Hopeless 0 2 1  PHQ - 2 Score 1 5 2   Altered sleeping 1 0 0  Tired, decreased energy 0 3 1  Change in appetite 0 3 0  Feeling bad or failure about yourself  0 0 0  Trouble concentrating 3 3 3   Moving slowly or fidgety/restless 3 0 3  Suicidal thoughts 0 0 0  PHQ-9 Score 8 14 9   Difficult doing work/chores Not difficult at all Somewhat difficult     Scribe for Treatment Team: Jacinta Shoe, [name] 10/09/2023 10:35 AM

## 2023-10-09 NOTE — Group Note (Signed)
Date:  10/09/2023 Time:  9:17 PM  Group Topic/Focus:  Wrap-Up Group:   The focus of this group is to help patients review their daily goal of treatment and discuss progress on daily workbooks.    Participation Level:  Did Not Attend  Participation Quality:   n/a  Affect:   n/a  Cognitive:   n/a  Insight: None  Engagement in Group:   n/a  Modes of Intervention:   n/a  Additional Comments:  Patient did not attend wrap up group.   Kennieth Francois 10/09/2023, 9:17 PM

## 2023-10-09 NOTE — [facility] Group Notes (Signed)

## 2023-10-09 NOTE — Plan of Care (Signed)
   Problem: Education: Goal: Knowledge of [facility] Education information/materials will improve Outcome: Progressing   Problem: Activity: Goal: Interest or engagement in activities will improve Outcome: Progressing   Problem: Coping: Goal: Ability to verbalize frustrations and anger appropriately will improve Outcome: Progressing

## 2023-10-10 DIAGNOSIS — F250 Schizoaffective disorder, bipolar type: Secondary | ICD-10-CM | POA: Diagnosis not present

## 2023-10-10 MED ORDER — TRAZODONE HCL 100 MG PO TABS
100.0000 mg | ORAL_TABLET | Freq: Every day | ORAL | Status: DC
  Administered 2023-10-10 – 2023-10-11 (×2): 100 mg via ORAL
  Filled 2023-10-10 (×4): qty 1

## 2023-10-10 NOTE — Progress Notes (Signed)
   10/10/23 0006  Psych Admission Type (Psych Patients Only)  Admission Status Voluntary  Date 72 hour document signed  10/07/23  Time 72 hour document signed  0925  Psychosocial Assessment  Eye Contact Fair  Facial Expression Animated  Affect Anxious  Speech Logical/coherent  Interaction Assertive  Motor Activity Slow  Appearance/Hygiene Unremarkable  Behavior Characteristics Cooperative;Appropriate to situation  Mood Anxious  Thought Process  Coherency WDL  Content WDL  Delusions None reported or observed  Perception Hallucinations  Hallucination Auditory  Judgment Poor  Confusion None  Danger to Self  Current suicidal ideation? Denies  Description of Suicide Plan none  Agreement Not to Harm Self Yes  Description of Agreement verbal  Danger to Others  Danger to Others None reported or observed

## 2023-10-10 NOTE — Group Note (Signed)
 Date:  10/10/2023 Time:  9:10 PM  Group Topic/Focus:  Wrap-Up Group:   The focus of this group is to help patients review their daily goal of treatment and discuss progress on daily workbooks.    Participation Level:  Active  Participation Quality:  Appropriate and Attentive  Affect:  Appropriate  Cognitive:  Alert and Appropriate  Insight: Appropriate and Good  Engagement in Group:  Engaged  Modes of Intervention:  Discussion and Education  Additional Comments:  Pt attended and participated in wrap up group this evening and rated their day a 6/10. Pt stated that they went to groups, rested and completed their goal, which was to make progress.   Rosella DELENA Pouch 10/10/2023, 9:10 PM

## 2023-10-10 NOTE — Group Note (Signed)
 [name] Group Therapy Note   Group Date: 10/09/2023 Start Time: 1300 End Time: 1400   Participation:  did not attend  Type of Therapy:  Group Therapy   Topic:  Stronger Together: Improving Interpersonal Skills for Better Relationships  Objective: To enhance interpersonal skills by improving communication, setting healthy boundaries, and fostering positive relationships.  Goals: Identify healthy and unhealthy relationship qualities. Practice active listening and assertive communication. Develop an action plan for setting boundaries and improving relationships.  Summary: In this class, participants explored key elements of building strong, healthy relationships. They learned about different communication styles, practiced active listening, and discussed the importance of setting boundaries. The group created actionable steps to improve their interpersonal skills and relationships.  Therapeutic Modalities Used: Elements of Cognitive Behavioral Therapy (reframing and thought restructuring, problem solving) Elements of DBT (mindfulness)   William Romero Shareta Fishbaugh, LCSWA 10/10/2023  6:28 PM

## 2023-10-10 NOTE — Progress Notes (Signed)
   10/10/23 0810  Psych Admission Type (Psych Patients Only)  Admission Status Voluntary  Psychosocial Assessment  Patient Complaints Anxiety  Eye Contact Fair  Facial Expression Animated  Affect Anxious  Speech Logical/coherent  Interaction Assertive  Motor Activity Slow  Appearance/Hygiene Unremarkable  Behavior Characteristics Cooperative;Appropriate to situation  Mood Anxious  Thought Process  Coherency WDL  Content WDL  Delusions None reported or observed  Perception WDL  Hallucination Auditory  Judgment Poor  Confusion None  Danger to Self  Current suicidal ideation? Denies  Agreement Not to Harm Self Yes  Description of Agreement Verbal  Danger to Others  Danger to Others None reported or observed

## 2023-10-10 NOTE — Group Note (Signed)
 Recreation Therapy Group Note   Group Topic:Animal Assisted Therapy   Group Date: 10/10/2023 Start Time: 9049 End Time: 1030 Facilitators: Camp Gopal-McCall, LRT,CTRS Location: [address] Dayroom   Animal-Assisted Activity (AAA) Program Checklist/Progress Notes Patient Eligibility Criteria Checklist & Daily Group note for Rec Tx Intervention  AAA/T Program Assumption of Risk Form signed by Patient/ or Parent Legal Guardian Yes  Patient is free of allergies or severe asthma Yes  Patient reports no fear of animals Yes  Patient reports no history of cruelty to animals Yes  Patient understands his/her participation is voluntary Yes  Patient washes hands before animal contact Yes  Patient washes hands after animal contact Yes  Education: Hand Washing, Appropriate Animal Interaction   Education Outcome: Acknowledges education.    Affect/Mood: Flat   Participation Level: Minimal   Participation Quality: Independent   Behavior: Cooperative   Speech/Thought Process: Relevant   Insight: Fair   Judgement: Fair    Modes of Intervention: Teaching Laboratory Technician   Patient Response to Interventions:  Receptive   Education Outcome:  In group clarification offered    Clinical Observations/Individualized Feedback: Patient attended session and interacted appropriately with therapy dog and peers. Patient left group and came back about two times before leaving and not returning.    Plan: Continue to engage patient in RT group sessions 2-3x/week.   Sevan Mcbroom-McCall, LRT,CTRS 10/10/2023 2:00 PM

## 2023-10-10 NOTE — Plan of Care (Signed)
  Problem: Activity: Goal: Interest or engagement in activities will improve Outcome: Not Progressing Goal: Sleeping patterns will improve Outcome: Progressing   Problem: Coping: Goal: Ability to verbalize frustrations and anger appropriately will improve Outcome: Progressing Goal: Ability to demonstrate self-control will improve Outcome: Progressing

## 2023-10-10 NOTE — Plan of Care (Signed)
   Problem: Education: Goal: Knowledge of [facility] Education information/materials will improve Outcome: Progressing   Problem: Activity: Goal: Interest or engagement in activities will improve Outcome: Progressing   Problem: Coping: Goal: Ability to verbalize frustrations and anger appropriately will improve Outcome: Progressing

## 2023-10-10 NOTE — Progress Notes (Signed)
Pt woke up this morning complaining of hearing voices that are saying bad words to him. Pt would not elaborate further. Vistaril 50 mg given for anxiety, will continue to monitor.

## 2023-10-10 NOTE — Progress Notes (Signed)
 [facility] MD Progress Note  10/10/2023 11:45 AM William Romero  MRN:  [id number]  Subjective:    William Romero is a 43 y.o. male  with a past psychiatric history of schizophrenia, reported ADHD, GAD, and episodes of depression. Patient initially arrived to [facility] on 1/28 for brief SI without plan, worsening auditory hallucinations and paranoia leading to job loss, and admitted to [facility] on 1/28 for impaired functioning and stabilization of acute on chronic psychiatric conditions. PMHx is noncontributory.    Per chart review patient is compliant with scheduled medications including Norvasc  5 mg daily, Cogentin  0.5 mg daily, Depakote  1000 mg at bedtime, Prolixin  10 mg twice daily, Inderal  10 mg twice daily and trazodone  50 mg at bedtime as needed for sleep using it nightly, Atarax  as needed for anxiety using average once to twice daily since admission.  On exam today, patient reports admission triggered by noncompliance with medications 2 to 4 weeks after discharge from here, he reports voices and mood improved significantly on medicine but he stopped the medications for unclear reasons.  He also reports increased use of marijuana since discharge.  He reports since admitted this time voices are improving but remains to be ongoing on and off occasionally disruptive he denies visual hallucinations and denies commanding hallucinations to harm self or others.  He denies SI or HI.  He reports improved sleep but remains interrupted, discussed titrating trazodone  from 50 to 100 mg at night.  He describes anxiety is improved with current medication regimen, will follow and consider titrating propranolol  to 3 times daily tomorrow if needed.  I discussed with patient need to attend groups, discussed locking his room during group time to encourage participation, will follow.  He reports he recently had a part-time job but he lost it secondary to worsening voices and he plans to restart applying for jobs  again after discharge.   Principal Problem: Schizoaffective disorder, bipolar type (HCC) Diagnosis: Principal Problem:   Schizoaffective disorder, bipolar type (HCC) Active Problems:   Schizoaffective disorder, depressive type (HCC)  Total Time spent with patient: 20 minutes  Past Psychiatric History:   Current psychiatrist: Previously seen by Dr. Prentice Espy 04/13/2023, did not follow up with  Current therapist: none. Previous reported psychiatric diagnoses: MDD, ?ADHD, schizophrenia, GAD Current psychiatric medications: none. Psychiatric medication history/compliance: Per chart review, poor with the exception of Adderall.  Full med trials include: adderall, bupropion, duloxetine , haloperidol , hydroxyzine  (ineffective), lorazepam , olanzapine , ziprasidone , paliperidone , risperidone ,  Psychiatric hospitalization(s): Approximately 6 stays over the past from 2012-2019.  Most recently, patient had 2 separate stays at [facility] in 2024, 6/11 - 6/19 and 6/22 - 7/1. Psychotherapy history: None Neuromodulation history: none. History of suicide (obtained from HPI): Reported 1 attempted OD in distant past, presently denied History of homicide or aggression (obtained in HPI): none.    Past Medical History:  Past Medical History:  Diagnosis Date   Paranoid schizophrenia (HCC) 10/03/2023   Schizophrenia (HCC)     Past Surgical History:  Procedure Laterality Date   APPENDECTOMY     Family History: History reviewed. No pertinent family history. Family Psychiatric  History: See H&P Social History:  Social History   Substance and Sexual Activity  Alcohol Use Not Currently   Alcohol/week: 5.0 standard drinks of alcohol   Types: 5 Cans of beer per week     Social History   Substance and Sexual Activity  Drug Use No    Social History   Socioeconomic History   Marital  status: Single    Spouse name: Not on file   Number of children: Not on file   Years of education: Not on file   Highest  education level: Not on file  Occupational History   Not on file  Tobacco Use   Smoking status: Former    Current packs/day: 0.50    Types: Cigarettes   Smokeless tobacco: Never  Vaping Use   Vaping status: Never Used  Substance and Sexual Activity   Alcohol use: Not Currently    Alcohol/week: 5.0 standard drinks of alcohol    Types: 5 Cans of beer per week   Drug use: No   Sexual activity: Not on file  Other Topics Concern   Not on file  Social History Narrative   Not on file   Social Drivers of Health   Financial Resource Strain: Not on file  Food Insecurity: No Food Insecurity (10/03/2023)   Hunger Vital Sign    Worried About Running Out of Food in the Last Year: Never true    Ran Out of Food in the Last Year: Never true  Transportation Needs: No Transportation Needs (10/03/2023)   PRAPARE - Administrator, Civil Service (Medical): No    Lack of Transportation (Non-Medical): No  Physical Activity: Not on file  Stress: Not on file  Social Connections: Not on file   Additional Social History:                           Current Medications: Current Facility-Administered Medications  Medication Dose Route Frequency Provider Last Rate Last Admin   acetaminophen  (TYLENOL ) tablet 650 mg  650 mg Oral Q6H PRN Delsie Lynwood Morene Lavone, MD   650 mg at 10/09/23 9386   alum & mag hydroxide-simeth (MAALOX/MYLANTA) 200-200-20 MG/5ML suspension 30 mL  30 mL Oral Q4H PRN Delsie Lynwood Morene Lavone, MD       amLODipine  (NORVASC ) tablet 5 mg  5 mg Oral Daily Massengill, Nathan, MD   5 mg at 10/10/23 9196   benztropine  (COGENTIN ) tablet 0.5 mg  0.5 mg Oral Daily Massengill, Nathan, MD   0.5 mg at 10/10/23 9196   cloNIDine  (CATAPRES ) tablet 0.1 mg  0.1 mg Oral Q4H PRN Massengill, Rankin, MD       haloperidol  (HALDOL ) tablet 5 mg  5 mg Oral TID PRN Delsie Lynwood Morene Lavone, MD   5 mg at 10/05/23 9380   And   diphenhydrAMINE  (BENADRYL ) capsule 50 mg   50 mg Oral TID PRN Delsie Lynwood Morene Lavone, MD   50 mg at 10/05/23 9380   haloperidol  lactate (HALDOL ) injection 5 mg  5 mg Intramuscular TID PRN Delsie Lynwood Morene Lavone, MD       And   diphenhydrAMINE  (BENADRYL ) injection 50 mg  50 mg Intramuscular TID PRN Delsie Lynwood Morene Lavone, MD       And   LORazepam  (ATIVAN ) injection 2 mg  2 mg Intramuscular TID PRN Delsie Lynwood Morene Lavone, MD       haloperidol  lactate (HALDOL ) injection 10 mg  10 mg Intramuscular TID PRN Delsie Lynwood Morene Lavone, MD       And   diphenhydrAMINE  (BENADRYL ) injection 50 mg  50 mg Intramuscular TID PRN Delsie Lynwood Morene Lavone, MD       And   LORazepam  (ATIVAN ) injection 2 mg  2 mg Intramuscular TID PRN Delsie Lynwood Morene Lavone, MD       divalproex  (  DEPAKOTE  ER) 24 hr tablet 1,000 mg  1,000 mg Oral QHS Massengill, Nathan, MD       fluPHENAZine  (PROLIXIN ) 5 MG/ML solution 10 mg  10 mg Oral BID McQuilla, Jai B, MD   10 mg at 10/10/23 9196   hydrOXYzine  (ATARAX ) tablet 50 mg  50 mg Oral TID PRN Delsie Lynwood Morene Lavone, MD   50 mg at 10/10/23 0524   magnesium  hydroxide (MILK OF MAGNESIA) suspension 30 mL  30 mL Oral Daily PRN Delsie Lynwood Morene Lavone, MD       propranolol  (INDERAL ) tablet 10 mg  10 mg Oral Q12H Massengill, Rankin, MD   10 mg at 10/10/23 9196   traZODone  (DESYREL ) tablet 100 mg  100 mg Oral QHS Carmie Lanpher, MD        Lab Results:  No results found for this or any previous visit (from the past 48 hours).   Blood Alcohol level:  Lab Results  Component Value Date   ETH <10 10/03/2023   ETH <10 02/24/2023    Metabolic Disorder Labs: Lab Results  Component Value Date   HGBA1C 5.1 10/03/2023   MPG 99.67 10/03/2023   MPG 96.8 02/13/2023   No results found for: PROLACTIN Lab Results  Component Value Date   CHOL 157 10/03/2023   TRIG 76 10/03/2023   HDL 44 10/03/2023   CHOLHDL 3.6 10/03/2023   VLDL 15 10/03/2023   LDLCALC 98  10/03/2023   LDLCALC 77 02/13/2023    Physical Findings: AIMS:  , ,  ,  ,    CIWA:    COWS:     Musculoskeletal: Strength & Muscle Tone: WNL Gait & Station: Leonel Patient leans: None  Psychiatric Specialty Exam:  Presentation  General Appearance:  Casual  Eye Contact: Fair  Speech: Normal  Speech Volume: Normal  Handedness: Right   Mood and Affect  Mood: Anxious  Affect: Constricted   Thought Process  Thought Processes: Goal Directed  Descriptions of Associations:Intact  Orientation:Full (Time, Place and Person)  Thought Content:Logical  History of Schizophrenia/Schizoaffective disorder:Yes  Duration of Psychotic Symptoms:Greater than six months  Hallucinations:No data recorded  Ideas of Reference: Denies paranoia  Suicidal Thoughts:No data recorded  Homicidal Thoughts:No data recorded   Sensorium  Memory: Immediate Good; Recent Good  Judgment: Fair  Insight: Shallow   Executive Functions  Concentration: Poor  Attention Span: Poor  Recall: Poor  Fund of Knowledge: Fair  Language: Fair   Psychomotor Activity  Psychomotor Activity: Psychomotor Activity: Akathisia   Assets  Assets: Desire for Improvement; Resilience; Communication Skills   Sleep  Sleep: No data recorded    Physical Exam: Physical Exam Vitals reviewed.  Constitutional:      General: He is not in acute distress.    Appearance: He is normal weight. He is not toxic-appearing.  Pulmonary:     Effort: Pulmonary effort is normal. No respiratory distress.  Neurological:     Mental Status: He is alert.     Motor: No weakness.     Gait: Gait normal.  Psychiatric:        Behavior: Behavior normal.        Judgment: Judgment normal.    Review of Systems  Constitutional:  Negative for chills and fever.  Cardiovascular:  Negative for chest pain and palpitations.  Neurological:  Negative for dizziness, tingling, tremors and headaches.   Psychiatric/Behavioral:  Positive for depression, hallucinations and substance abuse. Negative for memory loss and suicidal ideas. The patient is nervous/anxious. The  patient does not have insomnia.   All other systems reviewed and are negative.  Blood pressure 113/87, pulse 72, temperature (!) 97.5 F (36.4 C), temperature source Oral, resp. rate 18, height 6' (1.829 m), weight 95.7 kg, SpO2 98%. Body mass index is 28.62 kg/m.   Treatment Plan Summary: Daily contact with patient to assess and evaluate symptoms and progress in treatment and Medication management    ASSESSMENT:   Diagnoses / Active Problems: Schizoaffective disorder, bipolar type Cannabis use disorder Akathisia   PLAN:   Safety and Monitoring: -  VOLUNTARY  admission to inpatient psychiatric unit for safety, stabilization and treatment. Rescinded 72-hour form at 1:02 PM on 10/08/2023 - Daily contact with patient to assess and evaluate symptoms and progress in treatment - Patient's case to be discussed in multi-disciplinary team meeting -  Observation Level : q15 minute checks -  Vital signs:  q12 hours -  Precautions: suicide, elopement, and assault   2. Psychiatric Diagnoses and Treatment:     #Schizoaffective disorder, type #Cannabis use disorder - Continue Prolixin   10 mg twice daily, continue to plan for LAI prior to discharge.  Monitor akathisia -- Previously discontinued Prozac   - Restart propranolol  IR 10 mg 3 times daily for akathisia -Consolidate Depakote  from ER 500 mg bid -> to ER 1000 mg at bedtime - for mood stabilization.  Depakote  level ordered for Thursday morning.  Change trazodone  from 50 mg at bedtime as needed to 100 mg at bedtime scheduled to help with sleep. - Continue PRN Vistaril  50 mg 3 times daily for anxiety.  - The risks/benefits/side-effects/alternatives to this medication were discussed in detail with the patient and time was given for questions. The patient consents to  medication trial.  - Metabolic profile and EKG monitoring obtained while on an atypical antipsychotic  BMI: 28.62 TSH: 0.641 Lipid panel: LDL 98 WNL HbgA1c: 5.1 QTc: 462 - Encouraged patient to participate in unit milieu and in scheduled group therapies  - Short Term Goals: Ability to identify changes in lifestyle to reduce recurrence of condition will improve, Ability to verbalize feelings will improve, Ability to disclose and discuss suicidal ideas, Ability to demonstrate self-control will improve, and Ability to identify and develop effective coping behaviors will improve - Long Term Goals: Improvement in symptoms so as ready for discharge   Other PRNS: Atarax  50 mg 3 times daily, Tylenol  650 mg every 6 hours, agitation   Other labs reviewed on admission:                3. Medical Issues Being Addressed:   #Hypokalemia resolved on repeat lab monitoring 1/30  High blood pressure-start amlodipine  5 mg once daily -Continue clonidine  0.1 mg as needed for elevated blood pressure with parameters  4. Discharge Planning:    - Estimated discharge date: 4-5 more days  - Social work and case management to assist with discharge planning and identification of hospital follow-up needs prior to discharge. - Discharge concerns: Need to establish a safety plan; medication compliance and effectiveness. - Discharge goals: Return home with outpatient referrals for mental health follow-up including medication management/psychotherapy.   Iasiah Ozment Evelena, MD 10/10/2023, 11:45 AM  Total Time Spent in Direct Patient Care:  I personally spent 35 minutes on the unit in direct patient care. The direct patient care time included face-to-face time with the patient, reviewing the patient's chart, communicating with other professionals, and coordinating care. Greater than 50% of this time was spent in counseling or coordinating care with the  patient regarding goals of hospitalization, psycho-education, and  discharge planning needs.   Kyanne Rials Evelena, MD Psychiatrist

## 2023-10-11 DIAGNOSIS — F250 Schizoaffective disorder, bipolar type: Secondary | ICD-10-CM | POA: Diagnosis not present

## 2023-10-11 MED ORDER — NICOTINE POLACRILEX 2 MG MT GUM: 2.0000 mg | CHEWING_GUM | OROMUCOSAL | Status: DC | PRN

## 2023-10-11 MED ORDER — PROPRANOLOL HCL 10 MG PO TABS
10.0000 mg | ORAL_TABLET | Freq: Three times a day (TID) | ORAL | Status: DC
  Administered 2023-10-11 – 2023-10-12 (×3): 10 mg via ORAL
  Filled 2023-10-11 (×7): qty 1

## 2023-10-11 NOTE — Progress Notes (Signed)
 [facility] MD Progress Note  10/11/2023 10:15 AM William Romero  MRN:  [id number]  Subjective:    William Romero is a 43 y.o. male  with a past psychiatric history of schizophrenia, reported ADHD, GAD, and episodes of depression. Patient initially arrived to [facility] on 1/28 for brief SI without plan, worsening auditory hallucinations and paranoia leading to job loss, and admitted to [facility] on 1/28 for impaired functioning and stabilization of acute on chronic psychiatric conditions. PMHx is noncontributory.    Per chart review patient is compliant with scheduled medications including Norvasc  5 mg daily, Cogentin  0.5 mg daily, Depakote  1000 mg at bedtime, Prolixin  10 mg twice daily, Inderal  10 mg twice daily and trazodone  100 mg at bedtime scheduled, Atarax  as needed for anxiety using average once to twice daily since admission.  On exam today, patient presents noting he feels much improved over the past 2 days and request discharge today.  With further discussion he continues to report improved depression but continues to feel on and off anxious mainly related about wanting to be discharged and start looking for part-time job, he presents future oriented and goal oriented to start applying for jobs soon after discharge given he lives in his car for the past 2 months with a goal to start getting a motel room and search for an apartment.  He reports compliance with medication in the hospital and reports it is helping him with mood and also reports improvement in regard to auditory hallucinations which he scales today 3 out of 1010 being the worst much improved since admission describes them as minimal and he is able to distract himself and control the voices he denies any command hallucinations to harm self or others.  He denies SI HI or VH.  He denies side effect to current medication regimen and agrees to comply after discharge, he reports improved sleep since increase of trazodone  last night.   He attended groups yesterday with minimal encouragement reported and was reported to be interactive and participating during group time.  Discussed with patient plan to titrate propranolol  today to 3 times daily to help further with anxiety and discharge him tomorrow morning if continues to do well, patient agrees with the plan.    Principal Problem: Schizoaffective disorder, bipolar type (HCC) Diagnosis: Principal Problem:   Schizoaffective disorder, bipolar type (HCC) Active Problems:   Schizoaffective disorder, depressive type (HCC)  Total Time spent with patient: 20 minutes  Past Psychiatric History:   Current psychiatrist: Previously seen by Dr. Prentice Espy 04/13/2023, did not follow up with  Current therapist: none. Previous reported psychiatric diagnoses: MDD, ?ADHD, schizophrenia, GAD Current psychiatric medications: none. Psychiatric medication history/compliance: Per chart review, poor with the exception of Adderall.  Full med trials include: adderall, bupropion, duloxetine , haloperidol , hydroxyzine  (ineffective), lorazepam , olanzapine , ziprasidone , paliperidone , risperidone ,  Psychiatric hospitalization(s): Approximately 6 stays over the past from 2012-2019.  Most recently, patient had 2 separate stays at [facility] in 2024, 6/11 - 6/19 and 6/22 - 7/1. Psychotherapy history: None Neuromodulation history: none. History of suicide (obtained from HPI): Reported 1 attempted OD in distant past, presently denied History of homicide or aggression (obtained in HPI): none.    Past Medical History:  Past Medical History:  Diagnosis Date   Paranoid schizophrenia (HCC) 10/03/2023   Schizophrenia (HCC)     Past Surgical History:  Procedure Laterality Date   APPENDECTOMY     Family History: History reviewed. No pertinent family history. Family Psychiatric  History: See H&P  Social History:  Social History   Substance and Sexual Activity  Alcohol Use Not Currently   Alcohol/week: 5.0  standard drinks of alcohol   Types: 5 Cans of beer per week     Social History   Substance and Sexual Activity  Drug Use No    Social History   Socioeconomic History   Marital status: Single    Spouse name: Not on file   Number of children: Not on file   Years of education: Not on file   Highest education level: Not on file  Occupational History   Not on file  Tobacco Use   Smoking status: Former    Current packs/day: 0.50    Types: Cigarettes   Smokeless tobacco: Never  Vaping Use   Vaping status: Never Used  Substance and Sexual Activity   Alcohol use: Not Currently    Alcohol/week: 5.0 standard drinks of alcohol    Types: 5 Cans of beer per week   Drug use: No   Sexual activity: Not on file  Other Topics Concern   Not on file  Social History Narrative   Not on file   Social Drivers of Health   Financial Resource Strain: Not on file  Food Insecurity: No Food Insecurity (10/03/2023)   Hunger Vital Sign    Worried About Running Out of Food in the Last Year: Never true    Ran Out of Food in the Last Year: Never true  Transportation Needs: No Transportation Needs (10/03/2023)   PRAPARE - Administrator, Civil Service (Medical): No    Lack of Transportation (Non-Medical): No  Physical Activity: Not on file  Stress: Not on file  Social Connections: Not on file   Additional Social History:                           Current Medications: Current Facility-Administered Medications  Medication Dose Route Frequency Provider Last Rate Last Admin   acetaminophen  (TYLENOL ) tablet 650 mg  650 mg Oral Q6H PRN Delsie Lynwood Morene Lavone, MD   650 mg at 10/10/23 1421   alum & mag hydroxide-simeth (MAALOX/MYLANTA) 200-200-20 MG/5ML suspension 30 mL  30 mL Oral Q4H PRN Delsie Lynwood Morene Lavone, MD       amLODipine  (NORVASC ) tablet 5 mg  5 mg Oral Daily Massengill, Nathan, MD   5 mg at 10/11/23 9262   benztropine  (COGENTIN ) tablet 0.5 mg  0.5 mg  Oral Daily Massengill, Rankin, MD   0.5 mg at 10/11/23 9262   cloNIDine  (CATAPRES ) tablet 0.1 mg  0.1 mg Oral Q4H PRN Massengill, Rankin, MD       haloperidol  (HALDOL ) tablet 5 mg  5 mg Oral TID PRN Delsie Lynwood Morene Lavone, MD   5 mg at 10/05/23 9380   And   diphenhydrAMINE  (BENADRYL ) capsule 50 mg  50 mg Oral TID PRN Delsie Lynwood Morene Lavone, MD   50 mg at 10/05/23 9380   haloperidol  lactate (HALDOL ) injection 5 mg  5 mg Intramuscular TID PRN Delsie Lynwood Morene Lavone, MD       And   diphenhydrAMINE  (BENADRYL ) injection 50 mg  50 mg Intramuscular TID PRN Delsie Lynwood Morene Lavone, MD       And   LORazepam  (ATIVAN ) injection 2 mg  2 mg Intramuscular TID PRN Delsie Lynwood Morene Lavone, MD       haloperidol  lactate (HALDOL ) injection 10 mg  10 mg Intramuscular TID PRN Delsie,  Lynwood Morene Deems, MD       And   diphenhydrAMINE  (BENADRYL ) injection 50 mg  50 mg Intramuscular TID PRN Delsie Lynwood Morene Deems, MD       And   LORazepam  (ATIVAN ) injection 2 mg  2 mg Intramuscular TID PRN Delsie Lynwood Morene Deems, MD       divalproex  (DEPAKOTE  ER) 24 hr tablet 1,000 mg  1,000 mg Oral QHS Massengill, Rankin, MD   1,000 mg at 10/10/23 2109   fluPHENAZine  (PROLIXIN ) 5 MG/ML solution 10 mg  10 mg Oral BID McQuilla, Jai B, MD   10 mg at 10/10/23 2108   hydrOXYzine  (ATARAX ) tablet 50 mg  50 mg Oral TID PRN Delsie Lynwood Morene Deems, MD   50 mg at 10/11/23 0741   magnesium  hydroxide (MILK OF MAGNESIA) suspension 30 mL  30 mL Oral Daily PRN Delsie Lynwood Morene Deems, MD       propranolol  (INDERAL ) tablet 10 mg  10 mg Oral TID Evelena Figures, MD       traZODone  (DESYREL ) tablet 100 mg  100 mg Oral QHS Lakera Viall, MD   100 mg at 10/10/23 2109    Lab Results:  No results found for this or any previous visit (from the past 48 hours).   Blood Alcohol level:  Lab Results  Component Value Date   ETH <10 10/03/2023   ETH <10 02/24/2023     Metabolic Disorder Labs: Lab Results  Component Value Date   HGBA1C 5.1 10/03/2023   MPG 99.67 10/03/2023   MPG 96.8 02/13/2023   No results found for: PROLACTIN Lab Results  Component Value Date   CHOL 157 10/03/2023   TRIG 76 10/03/2023   HDL 44 10/03/2023   CHOLHDL 3.6 10/03/2023   VLDL 15 10/03/2023   LDLCALC 98 10/03/2023   LDLCALC 77 02/13/2023    Physical Findings: AIMS:  , ,  ,  ,    CIWA:    COWS:     Musculoskeletal: Strength & Muscle Tone: WNL Gait & Station: WNL Patient leans: None  Psychiatric Specialty Exam:  Presentation  General Appearance:  Casual  Eye Contact: Fair  Speech: Normal  Speech Volume: Normal  Handedness: Right   Mood and Affect  Mood: Improved, less anxious, no dysphoric mood noted  Affect: Constricted Restricted affect at baseline  Thought Process  Thought Processes: Goal Directed  Descriptions of Associations:Intact  Orientation:Full (Time, Place and Person)  Thought Content:Logical Linear and organized History of Schizophrenia/Schizoaffective disorder:Yes  Duration of Psychotic Symptoms:Greater than six months  Hallucinations:No data recorded  Ideas of Reference: Denies paranoia  Suicidal Thoughts:No data recorded  Homicidal Thoughts:No data recorded   Sensorium  Memory: Immediate Good; Recent Good  Judgment: Fair  Insight: Improved yet limited   Executive Functions  Concentration: Improved yet limited  Attention Span: Improved yet limited  Recall: Improved yet limited  Fund of Knowledge: Fair  Language: Fair   Lexicographer Activity: Psychomotor Activity: Akathisia   Assets  Assets: Desire for Improvement; Resilience; Communication Skills   Sleep  Sleep: No data recorded    Physical Exam: Physical Exam Vitals reviewed.  Constitutional:      General: He is not in acute distress.    Appearance: He is normal weight. He is not  toxic-appearing.  Pulmonary:     Effort: Pulmonary effort is normal. No respiratory distress.  Neurological:     Mental Status: He is alert.     Motor: No weakness.  Gait: Gait normal.  Psychiatric:        Behavior: Behavior normal.        Judgment: Judgment normal.    Review of Systems  Constitutional:  Negative for chills and fever.  Cardiovascular:  Negative for chest pain and palpitations.  Neurological:  Negative for dizziness, tingling, tremors and headaches.  Psychiatric/Behavioral:  Positive for depression, hallucinations and substance abuse. Negative for memory loss and suicidal ideas. The patient is nervous/anxious. The patient does not have insomnia.   All other systems reviewed and are negative.  Blood pressure (!) 114/96, pulse 76, temperature 98.3 F (36.8 C), temperature source Oral, resp. rate 20, height 6' (1.829 m), weight 95.7 kg, SpO2 100%. Body mass index is 28.62 kg/m.   Treatment Plan Summary: Daily contact with patient to assess and evaluate symptoms and progress in treatment and Medication management    ASSESSMENT:   Diagnoses / Active Problems: Schizoaffective disorder, bipolar type Cannabis use disorder Akathisia   PLAN:   Safety and Monitoring: -  VOLUNTARY  admission to inpatient psychiatric unit for safety, stabilization and treatment. Rescinded 72-hour form at 1:02 PM on 10/08/2023 - Daily contact with patient to assess and evaluate symptoms and progress in treatment - Patient's case to be discussed in multi-disciplinary team meeting -  Observation Level : q15 minute checks -  Vital signs:  q12 hours -  Precautions: suicide, elopement, and assault   2. Psychiatric Diagnoses and Treatment:     #Schizoaffective disorder, type #Cannabis use disorder - Continue Prolixin   10 mg twice daily, continue to plan for LAI prior to discharge.  Monitor akathisia -- Previously discontinued Prozac   - Titrate propranolol  from 10 mg twice daily to 10  mg 3 times daily to help with anxiety -Consolidate Depakote  from ER 500 mg bid -> to ER 1000 mg at bedtime - for mood stabilization.  Depakote  level ordered for Thursday morning.  Continue trazodone  100 mg at bedtime scheduled to help with sleep. - Continue PRN Vistaril  50 mg 3 times daily for anxiety.  - The risks/benefits/side-effects/alternatives to this medication were discussed in detail with the patient and time was given for questions. The patient consents to medication trial.  - Metabolic profile and EKG monitoring obtained while on an atypical antipsychotic  BMI: 28.62 TSH: 0.641 Lipid panel: LDL 98 WNL HbgA1c: 5.1 QTc: 462 - Encouraged patient to participate in unit milieu and in scheduled group therapies  - Short Term Goals: Ability to identify changes in lifestyle to reduce recurrence of condition will improve, Ability to verbalize feelings will improve, Ability to disclose and discuss suicidal ideas, Ability to demonstrate self-control will improve, and Ability to identify and develop effective coping behaviors will improve - Long Term Goals: Improvement in symptoms so as ready for discharge   Other PRNS: Atarax  50 mg 3 times daily, Tylenol  650 mg every 6 hours, agitation   Other labs reviewed on admission:                3. Medical Issues Being Addressed:   #Hypokalemia resolved on repeat lab monitoring 1/30  High blood pressure-start amlodipine  5 mg once daily -Continue clonidine  0.1 mg as needed for elevated blood pressure with parameters  4. Discharge Planning:    - Estimated discharge date: 4-5 more days  - Social work and case management to assist with discharge planning and identification of hospital follow-up needs prior to discharge. - Discharge concerns: Need to establish a safety plan; medication compliance and effectiveness. - Discharge  goals: Return home with outpatient referrals for mental health follow-up including medication  management/psychotherapy.   Lizandra Zakrzewski Evelena, MD 10/11/2023, 10:15 AM  Total Time Spent in Direct Patient Care:  I personally spent 35 minutes on the unit in direct patient care. The direct patient care time included face-to-face time with the patient, reviewing the patient's chart, communicating with other professionals, and coordinating care. Greater than 50% of this time was spent in counseling or coordinating care with the patient regarding goals of hospitalization, psycho-education, and discharge planning needs.   Rehema Muffley Evelena, MD Psychiatrist

## 2023-10-11 NOTE — Progress Notes (Signed)
   10/11/23 1400  Psych Admission Type (Psych Patients Only)  Admission Status Voluntary  Psychosocial Assessment  Patient Complaints None  Eye Contact Fair  Facial Expression Animated  Affect Anxious  Speech Logical/coherent  Interaction Assertive  Motor Activity Slow  Appearance/Hygiene Unremarkable  Behavior Characteristics Cooperative  Mood Pleasant  Thought Process  Content Paranoia  Delusions None reported or observed  Perception Hallucinations  Hallucination Auditory;Visual  Judgment Limited  Confusion None  Danger to Self  Current suicidal ideation? Denies  Agreement Not to Harm Self Yes  Danger to Others  Danger to Others None reported or observed

## 2023-10-11 NOTE — Group Note (Signed)
 Recreation Therapy Group Note   Group Topic:Team Building  Group Date: 10/11/2023 Start Time: 9062 End Time: 0959 Facilitators: Hamsa Laurich-McCall, LRT,CTRS Location: [address] Dayroom   Group Topic: Communication, Team Building, Problem Solving  Goal Area(s) Addresses:  Patient will effectively work with peer towards shared goal.  Patient will identify skills used to make activity successful.  Patient will identify how skills used during activity can be applied to reach post d/c goals.   Intervention: STEM Activity- Glass Blower/designer  Activity: Tallest [employer]. In teams of 5-6, patients were given 11 craft pipe cleaners. Using the materials provided, patients were instructed to compete again the opposing team(s) to build the tallest free-standing structure from floor level. The activity was timed; difficulty increased by clinical research associate as production designer, theatre/television/film continued.  Systematically resources were removed with additional directions for example, placing one arm behind their back, working in silence, and shape stipulations. LRT facilitated post-activity discussion reviewing team processes and necessary communication skills involved in completion. Patients were encouraged to reflect how the skills utilized, or not utilized, in this activity can be incorporated to positively impact support systems post discharge.  Education: Pharmacist, Community, Scientist, Physiological, Discharge Planning   Education Outcome: Acknowledges education/In group clarification offered/Needs additional education.    Affect/Mood: Appropriate   Participation Level: Engaged   Participation Quality: Independent   Behavior: Appropriate   Speech/Thought Process: Focused   Insight: Good   Judgement: Good   Modes of Intervention: STEM Activity   Patient Response to Interventions:  Engaged   Education Outcome:  In group clarification offered    Clinical Observations/Individualized Feedback: Pt was focused and  engaged. Pt appeared to take the lead in the construction of the tower. Pt instructed peers of where he needed assistance while also listening to the suggestions of peers. Pt was appropriate and able to give full attention to the activity.      Plan: Continue to engage patient in RT group sessions 2-3x/week.   Mohammad Granade-McCall, LRT,CTRS 10/11/2023 1:14 PM

## 2023-10-11 NOTE — Group Note (Signed)
 Date:  10/11/2023 Time:  5:21 PM  Group Topic/Focus:  Self Care:   The focus of this group is to help patients understand the importance of self-care in order to improve or restore emotional, physical, spiritual, interpersonal, and financial health.    Participation Level:  Did Not Attend    Additional Comment Carlyon CHRISTELLA Blunt 10/11/2023, 5:21 PM

## 2023-10-11 NOTE — Plan of Care (Signed)
   Problem: Education: Goal: Knowledge of [facility] Education information/materials will improve Outcome: Progressing   Problem: Activity: Goal: Interest or engagement in activities will improve Outcome: Progressing   Problem: Coping: Goal: Ability to verbalize frustrations and anger appropriately will improve Outcome: Progressing

## 2023-10-11 NOTE — Group Note (Signed)
 Date:  10/11/2023 Time:  9:01 AM  Group Topic/Focus:  Goals Group:   The focus of this group is to help patients establish daily goals to achieve during treatment and discuss how the patient can incorporate goal setting into their daily lives to aide in recovery. Orientation:   The focus of this group is to educate the patient on the purpose and policies of crisis stabilization and provide a format to answer questions about their admission.  The group details unit policies and expectations of patients while admitted.    Participation Level:  Did Not Attend    Carlyon CHRISTELLA Blunt 10/11/2023, 9:01 AM

## 2023-10-11 NOTE — Progress Notes (Signed)
   10/11/23 0211  Psych Admission Type (Psych Patients Only)  Admission Status Voluntary  Psychosocial Assessment  Patient Complaints None  Eye Contact Fair  Facial Expression Animated  Affect Anxious  Speech Logical/coherent  Interaction Assertive  Motor Activity Slow  Appearance/Hygiene Unremarkable  Behavior Characteristics Cooperative;Appropriate to situation  Mood Pleasant  Thought Process  Coherency WDL  Content WDL  Delusions None reported or observed  Perception Hallucinations  Hallucination Auditory  Judgment Poor  Confusion None  Danger to Self  Current suicidal ideation? Denies  Description of Suicide Plan none  Agreement Not to Harm Self Yes  Description of Agreement verbal  Danger to Others  Danger to Others None reported or observed

## 2023-10-11 NOTE — [facility] Group Notes (Signed)
 Spirituality group facilitated by Elia Rockie Sofia, BCC.  Group Description: Group focused on topic of community. Patients participated in facilitated discussion around topic, connecting with one another around experiences and definitions for community. Group members engaged with visual explorer photos, reflecting on what community looks like for them today. Group engaged in discussion around how their definitions of community are present today in hospital.  Modalities: Psycho-social ed, Adlerian, Narrative, MI  Patient Progress: William Romero attended group.  Though verbal participation was minimal, he demonstrated engagement in the group conversation.

## 2023-10-11 NOTE — Plan of Care (Signed)

## 2023-10-11 NOTE — [facility] Group Notes (Signed)
 [facility] Group Notes:  (Nursing/MHT/Case Management/Adjunct)  Date:  10/11/2023  Time:  10:59 PM  Type of Therapy:  Psychoeducational Skills  Participation Level:  Did Not Attend  Participation Quality:  Resistant  Affect:  Resistant  Cognitive:  Lacking  Insight:  None  Engagement in Group:  None  Modes of Intervention:  Education  Summary of Progress/Problems: The patient did not attend group this evening.   Gerry Blanchfield S 10/11/2023, 10:59 PM

## 2023-10-12 DIAGNOSIS — F250 Schizoaffective disorder, bipolar type: Secondary | ICD-10-CM | POA: Diagnosis not present

## 2023-10-12 MED ORDER — NICOTINE POLACRILEX 2 MG MT GUM: 2.0000 mg | CHEWING_GUM | OROMUCOSAL | 0 refills | Status: DC | PRN

## 2023-10-12 MED ORDER — PROPRANOLOL HCL 10 MG PO TABS: 10.0000 mg | ORAL_TABLET | Freq: Three times a day (TID) | ORAL | 0 refills | Status: DC

## 2023-10-12 MED ORDER — FLUPHENAZINE HCL 10 MG PO TABS: 10.0000 mg | ORAL_TABLET | Freq: Two times a day (BID) | ORAL | 0 refills | Status: DC

## 2023-10-12 MED ORDER — AMLODIPINE BESYLATE 5 MG PO TABS: 5.0000 mg | ORAL_TABLET | Freq: Every day | ORAL | 0 refills | Status: DC

## 2023-10-12 MED ORDER — DIVALPROEX SODIUM ER 500 MG PO TB24: 1000.0000 mg | ORAL_TABLET | Freq: Every day | ORAL | 0 refills | Status: DC

## 2023-10-12 MED ORDER — HYDROXYZINE HCL 50 MG PO TABS: 50.0000 mg | ORAL_TABLET | Freq: Three times a day (TID) | ORAL | 0 refills | Status: DC | PRN

## 2023-10-12 MED ORDER — TRAZODONE HCL 100 MG PO TABS: 100.0000 mg | ORAL_TABLET | Freq: Every day | ORAL | 0 refills | Status: DC

## 2023-10-12 MED ORDER — FLUPHENAZINE HCL 5 MG PO TABS
10.0000 mg | ORAL_TABLET | Freq: Two times a day (BID) | ORAL | Status: DC
  Filled 2023-10-12 (×3): qty 40

## 2023-10-12 MED ORDER — BENZTROPINE MESYLATE 0.5 MG PO TABS: 0.5000 mg | ORAL_TABLET | Freq: Every day | ORAL | 0 refills | Status: DC

## 2023-10-12 NOTE — Progress Notes (Signed)
  [facility] Adult Case Management Discharge Plan :  Will you be returning to the same living situation after discharge:  Yes,  Pt will return to his car where he is currently living. Pt plans to get motel room after discharge.  At discharge, do you have transportation home?: Yes,  CSW arranged Safe Transport back to [facility] where pt car is for 1130AM Do you have the ability to pay for your medications: No. Pt does not have active health insurance - samples provided at discharge, pt reports having cash to get meds at pharmacy with script as well   Release of information consent forms completed and in the chart;  Patient's signature needed at discharge.  Patient to Follow up at:  Follow-up Information     [facility] Go on 10/18/2023.   Specialty: Professional Counselor Why: Please go to this provider on 10/18/23 at 9:00 am for an assessment for therapy services.  You may also go during walk in hours for new patients: Monday through Friday from 9 am to 1 pm. Contact information: [address]  [address] [place] [address] [phone]         [facility] Go on 10/17/2023.   Specialty: Behavioral Health Why: Please go to this provider for medication management services on 10/17/23 at 7:00 am. For fastest service, please go Monday through Friday, arrive by 7:00 am for same day service. Contact information: [address] [facility] [place]  [address] [phone]                Next level of care provider has access to [facility] Link:no  Safety Planning and Suicide Prevention discussed: No. Pt declined consents.     Has patient been referred to the Quitline?: Patient does not use tobacco/nicotine  products per pt report  Patient has been referred for addiction treatment: No known substance use disorder.  Jenkins LULLA Primer, LCSWA 10/12/2023, 9:11 AM

## 2023-10-12 NOTE — Transportation (Signed)
 10/12/2023  William Romero DOB: Nov 18, 1980 MRN: [id number]   RIDER WAIVER AND RELEASE OF LIABILITY  For the purposes of helping with transportation needs, [facility] partners with outside transportation providers (taxi companies, [place], catering manager.) to give [facility] patients or other approved people the choice of on-demand rides Public Librarian) to our buildings for non-emergency visits.  By using [employer], I, the person signing this document, on behalf of myself and/or any legal minors (in my care using the [employer]), agree:  Science Writer given to me are supplied by independent, outside transportation providers who do not work for, or have any affiliation with, [employer]. [place] is not a transportation company. [facility] has no control over the quality or safety of the rides I get using [employer]. [facility] has no control over whether any outside ride will happen on time or not. [facility] gives no guarantee on the reliability, quality, safety, or availability on any rides, or that no mistakes will happen. I know and accept that traveling by vehicle (car, truck, SVU, fleeta, bus, taxi, etc.) has risks of serious injuries such as disability, being paralyzed, and death. I know and agree the risk of using [employer] is mine alone, and not [employer]. [employer] are provided as is and as are available. The transportation providers are in charge for all inspections and care of the vehicles used to provide these rides. I agree not to take legal action against [place] [facility], its agents, employees, officers, directors, representatives, insurers, attorneys, assigns, successors, subsidiaries, and affiliates at any time for any reasons related directly or indirectly to using [employer]. I also agree not to take legal action against [place] or its affiliates for any injury, death, or damage to property caused by or related to using  [employer]. I have read this Waiver and Release of Liability, and I understand the terms used in it and their legal meaning. This Waiver is freely and voluntarily given with the understanding that my right (or any legal minors) to legal action against [place] [facility] relating to [employer] is knowingly given up to use these services.   I attest that I read the Ride Waiver and Release of Liability to William Romero, gave Mr. Sturgeon the opportunity to ask questions and answered the questions asked (if any). I affirm that William Romero then provided consent for assistance with transportation.

## 2023-10-12 NOTE — BHH Suicide Risk Assessment (Signed)
 [facility] Discharge Suicide Risk Assessment   Principal Problem: Schizoaffective disorder, bipolar type [facility]) Discharge Diagnoses: Principal Problem:   Schizoaffective disorder, bipolar type (HCC) Active Problems:   Schizoaffective disorder, depressive type (HCC)   Total Time spent with patient: 45 minutes  Reason for admission: My voices are becoming worse   William Romero is a 43 y.o. male  with a past psychiatric history of schizophrenia, reported ADHD, GAD, and episodes of depression. Patient initially arrived to [facility] on 1/28 for brief SI without plan, worsening auditory hallucinations and paranoia leading to job loss, and admitted to [facility] on 1/28 for impaired functioning and stabilization of acute on chronic psychiatric conditions. PMHx is noncontributory.  PTA Medications:  Medications Prior to Admission  Medication Sig Dispense Refill Last Dose/Taking   amphetamine -dextroamphetamine  (ADDERALL) 20 MG tablet Take 20 mg by mouth 2 (two) times daily.         risperiDONE  (RISPERDAL ) 0.5 MG tablet Take 1 tablet (0.5 mg total) by mouth 2 (two) times daily.          Hospital Course:   During the patient's hospitalization, patient had extensive initial psychiatric evaluation, and follow-up psychiatric evaluations every day.  Psychiatric diagnoses provided upon initial assessment: Schizoaffective disorder, depressive type, in depressive episode Cannabis use disorder  Patient's psychiatric medications were adjusted on admission: - Stop Risperdal  0.5 mg twice daily. - Start Geodon  40 mg twice daily with meals.  Patient was previously discharged from [facility] 02/2023 on Zyprexa , which patient discontinued shortly afterwards.  Patient is therefore a good candidate for LAI.  Will run test claim for Geodon  LAI.  During the hospitalization, other adjustments were made to the patient's psychiatric medication regimen: Patient was switched from Geodon  to Prolixin , was adjusted to 10 mg  twice daily, Depakote  was added and titrated up to 1000 mg at bedtime for mood stabilization, Cogentin  was added 0.5 mg daily for EPS prophylaxis, Norvasc  was continued 5 mg daily for hypertension, Inderal  was added 10 mg 3 times daily for anxiety and akathisia, trazodone  was added 100 mg at bedtime scheduled for sleep.  Patient's care was discussed during the interdisciplinary team meeting every day during the hospitalization.  The patient denied having side effects to prescribed psychiatric medication except for having symptoms consistent with akathisia first 2 days on Prolixin  improved significantly after starting propranolol ..  Gradually, patient started adjusting to milieu. The patient was evaluated each day by a clinical provider to ascertain response to treatment. Improvement was noted by the patient's report of decreasing symptoms, improved sleep and appetite, affect, medication tolerance, behavior, and participation in unit programming.  Patient was asked each day to complete a self inventory noting mood, mental status, pain, new symptoms, anxiety and concerns.    Symptoms were reported as significantly decreased or resolved completely by discharge.  Gradually during hospital stay patient reported significant improvement in regard to the voices but he continues to report auditory hallucinations which he described to be baseline, occasionally they are disruptive but he was able to distract himself from the voices and continue with his daily activities.  He did deny any commanding voices to harm self or others.  He reported some anxiety during the last 2 days of hospital stay but it was mainly related to being eager to leave the hospital and look for a part-time job. On day of discharge, patient was evaluated on 10/12/2023 the patient reports that their mood is stable. The patient denied having suicidal thoughts for more than 48 hours  prior to discharge.  Patient denies having homicidal thoughts.   Patient denies having auditory hallucinations.  Patient denies any visual hallucinations or other symptoms of psychosis. The patient was motivated to continue taking medication with a goal of continued improvement in mental health.   The patient reports their target psychiatric symptoms of auditory hallucinations, SI responded well to the psychiatric medications, and the patient reports overall benefit other psychiatric hospitalization. Supportive psychotherapy was provided to the patient. The patient also participated in regular group therapy while hospitalized. Coping skills, problem solving as well as relaxation therapies were also part of the unit programming.  Labs were reviewed with the patient, and abnormal results were discussed with the patient.  The patient is able to verbalize their individual safety plan to this provider.  Behavioral Events: None  Restraints: None  Groups: Attended and participated  Medications Changes: As above  Sleep  Fair, improved during hospital stay  Musculoskeletal: Strength & Muscle Tone: within normal limits Gait & Station: normal Patient leans: N/A  Psychiatric Specialty Exam  General Appearance: appears at stated age, fairly dressed and groomed  Behavior: pleasant and cooperative  Psychomotor Activity:No psychomotor agitation or retardation noted   Eye Contact: good Speech: normal amount, tone, volume and latency   Mood: euthymic Affect: congruent, pleasant and interactive  Thought Process: linear, goal directed, no circumstantial or tangential thought process noted, no racing thoughts or flight of ideas Descriptions of Associations: intact Thought Content: Hallucinations: denies AH, VH , does not appear responding to stimuli Delusions: No paranoia or other delusions noted Suicidal Thoughts: denies SI, intention, plan  Homicidal Thoughts: denies HI, intention, plan   Alertness/Orientation: alert and fully oriented  Insight:  fair, improved Judgment: fair, improved  Memory: intact  Executive Functions  Concentration: intact  Attention Span: Fair Recall: intact Fund of Knowledge: fair   Art Therapist  Concentration: intact Attention Span: Fair Recall: intact Fund of Knowledge: fair   Assets  Assets: Desire for Improvement; Resilience; Communication Skills   Physical Exam: Physical Exam ROS Blood pressure 108/89, pulse 79, temperature 98.4 F (36.9 C), temperature source Oral, resp. rate 16, height 6' (1.829 m), weight 95.7 kg, SpO2 97%. Body mass index is 28.62 kg/m.  Mental Status Per Nursing Assessment::   On Admission:  NA  Demographic Factors:  Male, Caucasian, Low socioeconomic status, and Living alone  Loss Factors: NA  Historical Factors: Prior suicide attempts  Risk Reduction Factors:   NA  Continued Clinical Symptoms: Symptoms improved significantly during hospital stay Schizophrenia:   Command hallucinatons Paranoid or undifferentiated type  Cognitive Features That Contribute To Risk:  Polarized thinking    Suicide Risk:  Minimal: No identifiable suicidal ideation.  Patients presenting with no risk factors but with morbid ruminations; may be classified as minimal risk based on the severity of the depressive symptoms   Follow-up Information     [facility] Go on 10/18/2023.   Specialty: Professional Counselor Why: Please go to this provider on 10/18/23 at 9:00 am for an assessment for therapy services.  You may also go during walk in hours for new patients: Monday through Friday from 9 am to 1 pm. Contact information: [address]  [address] [place] [address] [phone]         [facility]. Go on 10/17/2023.   Specialty: Behavioral Health Why: Please go to this provider for medication management services on 10/17/23 at 7:00 am. For fastest service, please go Monday through Friday, arrive  by 7:00 am  for same day service. Contact information: [address] [place]  [id number] [phone]                Plan Of Care/Follow-up recommendations:   Discharge recommendations:    Activity: as tolerated  Diet: heart healthy  # It is recommended to the patient to continue psychiatric medications as prescribed, after discharge from the hospital.     # It is recommended to the patient to follow up with your outpatient psychiatric provider and PCP.   # It was discussed with the patient, the impact of alcohol, drugs, tobacco have been there overall psychiatric and medical wellbeing, and total abstinence from substance use was recommended the patient.ed.   # Prescriptions provided or sent directly to preferred pharmacy at discharge. Patient agreeable to plan. Given opportunity to ask questions. Appears to feel comfortable with discharge.    # In the event of worsening symptoms, the patient is instructed to call the crisis hotline, 911 and or go to the nearest ED for appropriate evaluation and treatment of symptoms. To follow-up with primary care provider for other medical issues, concerns and or health care needs   # Patient was discharged home with a plan to follow up as noted above.  -Follow-up with outpatient primary care doctor and other specialists -for management of chronic medical disease, including: Patient was recommended to follow-up with primary care provider regarding long-term management for hypertension, patient agrees.   Patient agrees with D/C instructions and plan.  The patient received suicide prevention pamphlet:  Yes Belongings returned:  Clothing and Valuables  Total Time Spent in Direct Patient Care:  I personally spent 5 minutes on the unit in direct patient care. The direct patient care time included face-to-face time with the patient, reviewing the patient's chart, communicating with other professionals, and coordinating care. Greater than 50% of  this time was spent in counseling or coordinating care with the patient regarding goals of hospitalization, psycho-education, and discharge planning needs.   Altha Sweitzer Evelena, MD 10/12/2023, 9:25 AM

## 2023-10-12 NOTE — Progress Notes (Signed)
 Patient verbalizes readiness for discharge. All patient belongings returned to patient. Discharge instructions read and discussed with patient (appointments, medications, resources). Patient expressed gratitude for care provided. Patient discharged to lobby at 1130 where safe transport was waiting.

## 2023-10-12 NOTE — Progress Notes (Signed)
   10/11/23 2212  Psych Admission Type (Psych Patients Only)  Admission Status Voluntary  Psychosocial Assessment  Patient Complaints None  Eye Contact Fair  Facial Expression Animated  Affect Anxious  Speech Logical/coherent  Interaction Assertive  Motor Activity Slow  Appearance/Hygiene Unremarkable  Behavior Characteristics Cooperative;Appropriate to situation  Mood Pleasant  Thought Process  Coherency WDL  Content WDL  Delusions None reported or observed  Perception Hallucinations  Hallucination Auditory  Judgment Limited  Confusion None  Danger to Self  Current suicidal ideation? Denies  Agreement Not to Harm Self Yes  Description of Agreement verbal  Danger to Others  Danger to Others None reported or observed

## 2023-10-12 NOTE — Discharge Summary (Signed)
 Physician Discharge Summary Note  Patient:  William Romero is an 43 y.o., male MRN:  [id number] DOB:  20-Feb-1981 Patient phone:  [phone] (home)  Patient address:   [address] [place] [facility] [place] [address],  Total Time spent with patient: 45 minutes  Date of Admission:  10/03/2023 Date of Discharge: 10/12/2023  Reason for Admission:  My voices are becoming worse   William Romero is a 43 y.o. male  with a past psychiatric history of schizophrenia, reported ADHD, GAD, and episodes of depression. Patient initially arrived to [facility] on 1/28 for brief SI without plan, worsening auditory hallucinations and paranoia leading to job loss, and admitted to [facility] on 1/28 for impaired functioning and stabilization of acute on chronic psychiatric conditions. PMHx is noncontributory.  Principal Problem: Schizoaffective disorder, bipolar type [facility]) Discharge Diagnoses: Principal Problem:   Schizoaffective disorder, bipolar type [facility]) Active Problems:   Schizoaffective disorder, depressive type [facility])   Past Psychiatric History:  Current psychiatrist: Previously seen by Dr. Prentice Espy 04/13/2023, did not follow up with  Current therapist: none. Previous reported psychiatric diagnoses: MDD, ?ADHD, schizophrenia, GAD Current psychiatric medications: none. Psychiatric medication history/compliance: Per chart review, poor with the exception of Adderall.  Full med trials include: adderall, bupropion, duloxetine , haloperidol , hydroxyzine  (ineffective), lorazepam , olanzapine , ziprasidone , paliperidone , risperidone ,  Psychiatric hospitalization(s): Approximately 6 stays over the past from 2012-2019.  Most recently, patient had 2 separate stays at [facility] in 2024, 6/11 - 6/19 and 6/22 - 7/1. Psychotherapy history: None Neuromodulation history: none. History of suicide (obtained from HPI): Reported 1 attempted OD in distant past, presently denied History of homicide or aggression (obtained in  HPI): none.  Past Medical History:  Past Medical History:  Diagnosis Date   Paranoid schizophrenia (HCC) 10/03/2023   Schizophrenia (HCC)     Past Surgical History:  Procedure Laterality Date   APPENDECTOMY      Family History: History reviewed. No pertinent family history. Family Psychiatric  History:  Denies family psychiatric history.  Social History:  Social History   Substance and Sexual Activity  Alcohol Use Not Currently   Alcohol/week: 5.0 standard drinks of alcohol   Types: 5 Cans of beer per week     Social History   Substance and Sexual Activity  Drug Use No    Social History   Socioeconomic History   Marital status: Single    Spouse name: Not on file   Number of children: Not on file   Years of education: Not on file   Highest education level: Not on file  Occupational History   Not on file  Tobacco Use   Smoking status: Former    Current packs/day: 0.50    Types: Cigarettes   Smokeless tobacco: Never  Vaping Use   Vaping status: Never Used  Substance and Sexual Activity   Alcohol use: Not Currently    Alcohol/week: 5.0 standard drinks of alcohol    Types: 5 Cans of beer per week   Drug use: No   Sexual activity: Not on file  Other Topics Concern   Not on file  Social History Narrative   Not on file   Social Drivers of Health   Financial Resource Strain: Not on file  Food Insecurity: No Food Insecurity (10/03/2023)   Hunger Vital Sign    Worried About Running Out of Food in the Last Year: Never true    Ran Out of Food in the Last Year: Never true  Transportation Needs: No Transportation Needs (10/03/2023)  PRAPARE - Administrator, Civil Service (Medical): No    Lack of Transportation (Non-Medical): No  Physical Activity: Not on file  Stress: Not on file  Social Connections: Not on file   Living situation:  Lives in car. Education: Grade 12 Occupational history: Lost job 1/27 Marital status: Single Children:  none. Legal: denied. Military: none.   Access to firearms: denied.  Substance Use History:  Alcohol: Intermittent, reported last drink 1 week ago.  Patient has had alcohol use disorder in the past,  Tobacco: Former Cannabis: Daily use, UDS positive for THC.  Patient says that he takes marijuana for sleep -- is interested in abstaining in the future, believes that if discharged on appropriate sleep medication, he will be able to discontinue use. IV drug use: Denied Prescription drug use: Adderall, last prescription filled 1/17. Other illicit drugs: Patient urine positive for amphetamines, has been prescribed Adderall 20 mg twice daily.  Of note, patient Adderall use likely contributed to previous presentations for paranoia/psychosis in the past -- per outpatient progress note with Dr. Lynnette, it appears that patient was very insistent on being put back on Adderall. Rehab history: Has previously lived at [facility] Course:   During the patient's hospitalization, patient had extensive initial psychiatric evaluation, and follow-up psychiatric evaluations every day.   Psychiatric diagnoses provided upon initial assessment: Schizoaffective disorder, depressive type, in depressive episode Cannabis use disorder   Patient's psychiatric medications were adjusted on admission: - Stop Risperdal  0.5 mg twice daily. - Start Geodon  40 mg twice daily with meals.  Patient was previously discharged from [facility] 02/2023 on Zyprexa , which patient discontinued shortly afterwards.  Patient is therefore a good candidate for LAI.  Will run test claim for Geodon  LAI.   During the hospitalization, other adjustments were made to the patient's psychiatric medication regimen: Patient was switched from Geodon  to Prolixin , was adjusted to 10 mg twice daily, Depakote  was added and titrated up to 1000 mg at bedtime for mood stabilization, Cogentin  was added 0.5 mg daily for EPS prophylaxis, Norvasc  was continued 5 mg  daily for hypertension, Inderal  was added 10 mg 3 times daily for anxiety and akathisia, trazodone  was added 100 mg at bedtime scheduled for sleep.   Patient's care was discussed during the interdisciplinary team meeting every day during the hospitalization.   The patient denied having side effects to prescribed psychiatric medication except for having symptoms consistent with akathisia first 2 days on Prolixin  improved significantly after starting propranolol ..   Gradually, patient started adjusting to milieu. The patient was evaluated each day by a clinical provider to ascertain response to treatment. Improvement was noted by the patient's report of decreasing symptoms, improved sleep and appetite, affect, medication tolerance, behavior, and participation in unit programming.  Patient was asked each day to complete a self inventory noting mood, mental status, pain, new symptoms, anxiety and concerns.     Symptoms were reported as significantly decreased or resolved completely by discharge.  Gradually during hospital stay patient reported significant improvement in regard to the voices but he continues to report auditory hallucinations which he described to be baseline, occasionally they are disruptive but he was able to distract himself from the voices and continue with his daily activities.  He did deny any commanding voices to harm self or others.  He reported some anxiety during the last 2 days of hospital stay but it was mainly related to being eager to leave the hospital and look  for a part-time job. On day of discharge, patient was evaluated on 10/12/2023 the patient reports that their mood is stable. The patient denied having suicidal thoughts for more than 48 hours prior to discharge.  Patient denies having homicidal thoughts.  Patient denies having auditory hallucinations.  Patient denies any visual hallucinations or other symptoms of psychosis. The patient was motivated to continue taking  medication with a goal of continued improvement in mental health.    The patient reports their target psychiatric symptoms of auditory hallucinations, SI responded well to the psychiatric medications, and the patient reports overall benefit other psychiatric hospitalization. Supportive psychotherapy was provided to the patient. The patient also participated in regular group therapy while hospitalized. Coping skills, problem solving as well as relaxation therapies were also part of the unit programming.   Labs were reviewed with the patient, and abnormal results were discussed with the patient.   The patient is able to verbalize their individual safety plan to this provider.   Behavioral Events: None   Restraints: None   Groups: Attended and participated   Medications Changes: As above   Sleep  Fair, improved during hospital stay  Physical Findings: AIMS: Facial and Oral Movements Muscles of Facial Expression: None Lips and Perioral Area: None Jaw: None Tongue: None,Extremity Movements Upper (arms, wrists, hands, fingers): None Lower (legs, knees, ankles, toes): None, Trunk Movements Neck, shoulders, hips: None, Global Judgements Severity of abnormal movements overall : None Incapacitation due to abnormal movements: None Patient's awareness of abnormal movements: No Awareness, Dental Status Current problems with teeth and/or dentures?: No Does patient usually wear dentures?: No Edentia?: No  CIWA:    COWS:     Musculoskeletal: Strength & Muscle Tone: within normal limits Gait & Station: normal Patient leans: N/A   Psychiatric Specialty Exam:  General Appearance: appears at stated age, fairly dressed and groomed  Behavior: pleasant and cooperative  Psychomotor Activity:No psychomotor agitation or retardation noted   Eye Contact: good Speech: normal amount, tone, volume and latency   Mood: euthymic Affect: congruent, pleasant and interactive  Thought Process:  linear, goal directed, no circumstantial or tangential thought process noted, no racing thoughts or flight of ideas Descriptions of Associations: intact Thought Content: Hallucinations: denies AH, VH , does not appear responding to stimuli Delusions: No paranoia or other delusions noted Suicidal Thoughts: denies SI, intention, plan  Homicidal Thoughts: denies HI, intention, plan   Alertness/Orientation: alert and fully oriented  Insight: fair, improved Judgment: fair, improved  Memory: intact  Executive Functions  Concentration: intact  Attention Span: Fair Recall: intact Fund of Knowledge: fair   Assets  Assets: Desire for Improvement; Resilience; Communication Skills    Physical Exam:  Physical Exam Vitals and nursing note reviewed.  Constitutional:      Appearance: Normal appearance.  HENT:     Head: Normocephalic and atraumatic.     Nose: Nose normal.  Eyes:     Extraocular Movements: Extraocular movements intact.  Pulmonary:     Effort: Pulmonary effort is normal.  Musculoskeletal:        General: Normal range of motion.     Cervical back: Normal range of motion.  Neurological:     General: No focal deficit present.     Mental Status: He is alert and oriented to person, place, and time. Mental status is at baseline.  Psychiatric:        Mood and Affect: Mood normal.        Behavior: Behavior normal.  Thought Content: Thought content normal.        Judgment: Judgment normal.    Review of Systems  All other systems reviewed and are negative.  Blood pressure 108/89, pulse 79, temperature 98.4 F (36.9 C), temperature source Oral, resp. rate 16, height 6' (1.829 m), weight 95.7 kg, SpO2 97%. Body mass index is 28.62 kg/m.   Social History   Tobacco Use  Smoking Status Former   Current packs/day: 0.50   Types: Cigarettes  Smokeless Tobacco Never   Tobacco Cessation:  A prescription for an FDA-approved tobacco cessation medication provided at  discharge   Blood Alcohol level:  Lab Results  Component Value Date   ETH <10 10/03/2023   ETH <10 02/24/2023    Metabolic Disorder Labs:  Lab Results  Component Value Date   HGBA1C 5.1 10/03/2023   MPG 99.67 10/03/2023   MPG 96.8 02/13/2023   No results found for: PROLACTIN Lab Results  Component Value Date   CHOL 157 10/03/2023   TRIG 76 10/03/2023   HDL 44 10/03/2023   CHOLHDL 3.6 10/03/2023   VLDL 15 10/03/2023   LDLCALC 98 10/03/2023   LDLCALC 77 02/13/2023    See Psychiatric Specialty Exam and Suicide Risk Assessment completed by Attending Physician prior to discharge.  Discharge destination:  Other:  Patient is homeless, lives in his car  Is patient on multiple antipsychotic therapies at discharge:  No   Has Patient had three or more failed trials of antipsychotic monotherapy by history:  No  Recommended Plan for Multiple Antipsychotic Therapies: NA  Discharge Instructions     Diet - low sodium heart healthy   Complete by: As directed    Increase activity slowly   Complete by: As directed       Allergies as of 10/12/2023   No Known Allergies      Medication List     STOP taking these medications    amphetamine -dextroamphetamine  20 MG tablet Commonly known as: ADDERALL   risperiDONE  0.5 MG tablet Commonly known as: RISPERDAL        TAKE these medications      Indication  amLODipine  5 MG tablet Commonly known as: NORVASC  Take 1 tablet (5 mg total) by mouth daily. Start taking on: October 13, 2023  Indication: High Blood Pressure   benztropine  0.5 MG tablet Commonly known as: COGENTIN  Take 1 tablet (0.5 mg total) by mouth daily.  Indication: Extrapyramidal Reaction caused by Medications   divalproex  500 MG 24 hr tablet Commonly known as: DEPAKOTE  ER Take 2 tablets (1,000 mg total) by mouth at bedtime.  Indication: MIXED BIPOLAR AFFECTIVE DISORDER   fluPHENAZine  10 MG tablet Commonly known as: PROLIXIN  Take 1 tablet (10 mg  total) by mouth 2 (two) times daily.  Indication: Psychosis   hydrOXYzine  50 MG tablet Commonly known as: ATARAX  Take 1 tablet (50 mg total) by mouth 3 (three) times daily as needed for anxiety.  Indication: Feeling Anxious   nicotine  polacrilex 2 MG gum Commonly known as: NICORETTE  Take 1 each (2 mg total) by mouth as needed for smoking cessation.  Indication: Nicotine  Addiction   propranolol  10 MG tablet Commonly known as: INDERAL  Take 1 tablet (10 mg total) by mouth 3 (three) times daily.  Indication: Feeling Anxious   traZODone  100 MG tablet Commonly known as: DESYREL  Take 1 tablet (100 mg total) by mouth at bedtime.  Indication: Trouble Sleeping        Follow-up Information     [facility] Go on 10/18/2023.   Specialty: Professional Counselor Why: Please go to this provider on 10/18/23 at 9:00 am for an assessment for therapy services.  You may also go during walk in hours for new patients: Monday through Friday from 9 am to 1 pm. Contact information: [address]  [address] [place] [address] [phone]         [facility]. Go on 10/17/2023.   Specialty: Behavioral Health Why: Please go to this provider for medication management services on 10/17/23 at 7:00 am. For fastest service, please go Monday through Friday, arrive by 7:00 am for same day service. Contact information: [address] [place]  [phone]                Discharge recommendations:    Activity: as tolerated  Diet: heart healthy  # It is recommended to the patient to continue psychiatric medications as prescribed, after discharge from the hospital.     # It is recommended to the patient to follow up with your outpatient psychiatric provider and PCP.   # It was discussed with the patient, the impact of alcohol, drugs, tobacco have been there overall psychiatric and medical wellbeing, and total abstinence  from substance use was recommended the patient.ed.   # Prescriptions provided or sent directly to preferred pharmacy at discharge. Patient agreeable to plan. Given opportunity to ask questions. Appears to feel comfortable with discharge.    # In the event of worsening symptoms, the patient is instructed to call the crisis hotline, 911 and or go to the nearest ED for appropriate evaluation and treatment of symptoms. To follow-up with primary care provider for other medical issues, concerns and or health care needs   # Patient was discharged home with a plan to follow up as noted above.  -Follow-up with outpatient primary care doctor and other specialists -for management of chronic medical disease, including: Patient was recommended to follow-up with primary care provider regarding long-term management for hypertension, patient agrees.   Patient agrees with D/C instructions and plan.   The patient received suicide prevention pamphlet:  Yes Belongings returned:  Clothing and Valuables  Total Time Spent in Direct Patient Care:  I personally spent 45 minutes on the unit in direct patient care. The direct patient care time included face-to-face time with the patient, reviewing the patient's chart, communicating with other professionals, and coordinating care. Greater than 50% of this time was spent in counseling or coordinating care with the patient regarding goals of hospitalization, psycho-education, and discharge planning needs.    SignedBETHA Levada Pillar, MD 10/12/2023, 9:33 AM

## 2023-11-05 ENCOUNTER — Ambulatory Visit (HOSPITAL_COMMUNITY)
Admission: EM | Admit: 2023-11-05 | Discharge: 2023-11-05 | Disposition: A | Discharge status: Home / Self Care | Attending: Psychiatry | Admitting: Psychiatry

## 2023-11-05 DIAGNOSIS — Z5902 Unsheltered homelessness: Secondary | ICD-10-CM | POA: Insufficient documentation

## 2023-11-05 DIAGNOSIS — R440 Auditory hallucinations: Secondary | ICD-10-CM

## 2023-11-05 DIAGNOSIS — F209 Schizophrenia, unspecified: Secondary | ICD-10-CM | POA: Insufficient documentation

## 2023-11-05 DIAGNOSIS — Z560 Unemployment, unspecified: Secondary | ICD-10-CM | POA: Insufficient documentation
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
## 2023-11-05 NOTE — ED Provider Notes (Signed)
 Behavioral Health Urgent Care Medical Screening Exam  Patient Name: William Romero MRN: [id number] Date of Evaluation: 11/05/23 Chief Complaint:  Auditory hallucinations Diagnosis:  Final diagnoses:  Auditory hallucinations  Schizophrenia, unspecified type (HCC)    History of Present illness: William Romero is a 43 y.o. male with a psychiatric history of schizophrenia and GAD. William Romero 43 y.o., male patient with presented to [facility] as a voluntary walk in unaccompanied  with complaints of auditory hallucinations.  [name], is seen face to face by this provider, consulted with Dr. Woodroe Mode; and chart reviewed on 11/05/23.  On evaluation William Romero reports auditory hallucinations that say "sexual things in a childs voice ".  The patient states "the voices are snapping my waist band and touching me inappropriately. My quality of life is suffering".  Patient denies SI, HI, and visual hallucinations. Patient states that " I do not want to go to the hospital. I have been two times and it doesn't help". Patient also states that medications do not help his condition thus endorsing non compliance after leaving the hospital. Patient reports a recent inpatient admission to [facility] with discharge on 10/12/23. Patient was scheduled for medication management and psychotherapy appointments on 10/17/23 and 10/18/23. The patient states that he forgot about his appointments. States that "I just checked out of my responsibilities when my phone was turned off". Patient states he is homeless at this time living in his car and he is currently unemployed.  Patient also denies current or past use of any substances, although chart states past abuse of cannabis.    During evaluation William Romero is sitting in no acute distress.  He is alert & oriented x 4, calm, cooperative and attentive for this assessment.  His mood is mildly anxious with congruent affect.  He has normal speech, and  behavior.  Objectively there is evidence of delusional thinking. Patient does not appear to be responding to internal or external stimuli although continuing to endorse auditory hallucinations.  Patient is able to converse coherently, goal directed thoughts.  He also denies suicidal/self-harm/homicidal ideation.   Patient answered question appropriately.    Flowsheet Row ED from 11/05/2023 in [facility] Most recent reading at 11/05/2023 11:19 AM Admission (Discharged) from 10/03/2023 in [facility] Most recent reading at 10/03/2023  5:36 PM ED from 10/03/2023 in [facility] Most recent reading at 10/03/2023  2:15 PM  C-SSRS RISK CATEGORY No Risk No Risk Error: Q3, 4, or 5 should not be populated when Q2 is No       Psychiatric Specialty Exam  Presentation  General Appearance:Appropriate for Environment; Casual  Eye Contact:Good  Speech:Clear and Coherent  Speech Volume:Normal  Handedness:Right   Mood and Affect  Mood: Anxious  Affect: Appropriate   Thought Process  Thought Processes: Goal Directed  Descriptions of Associations:Intact  Orientation:Full (Time, Place and Person)  Thought Content:Logical  Diagnosis of Schizophrenia or Schizoaffective disorder in past: Yes  Duration of Psychotic Symptoms: Greater than six months  Hallucinations:Auditory; Tactile " a childs voice saying sexual things"  Ideas of Reference:Delusions (states waist band on pants is being moved)  Suicidal Thoughts:No With Intent; Without Plan Without Plan  Homicidal Thoughts:No Without Plan   Sensorium  Memory: Immediate Fair; Recent Fair  Judgment: Fair  Insight: Shallow   Executive Functions  Concentration: Poor  Attention Span: Poor  Recall: Poor  Fund of Knowledge: Fair  Language:  Fair   Psychomotor Activity  Psychomotor Activity: Restlessness   Assets   Assets: Desire for Improvement; Transportation   Sleep  Sleep: Fair  Number of hours:  5   Physical Exam: Physical Exam HENT:     Head: Normocephalic.     Nose: Nose normal.     Mouth/Throat:     Pharynx: Oropharynx is clear.  Eyes:     Extraocular Movements: Extraocular movements intact.  Cardiovascular:     Rate and Rhythm: Normal rate.  Pulmonary:     Effort: Pulmonary effort is normal.  Musculoskeletal:        General: Swelling present.     Cervical back: Normal range of motion.     Right knee: Swelling present.  Skin:    General: Skin is dry.  Neurological:     Mental Status: He is alert.    Review of Systems  Constitutional: Negative.   HENT: Negative.    Eyes: Negative.   Respiratory: Negative.    Cardiovascular: Negative.   Musculoskeletal:  Positive for joint pain.       Pain and mild edema in right knee   Skin: Negative.   Neurological: Negative.   Psychiatric/Behavioral:  Positive for hallucinations.    Blood pressure 120/78, pulse 78, temperature 97.9 F (36.6 C), temperature source Oral, resp. rate 17, SpO2 100%. There is no height or weight on file to calculate BMI.  Musculoskeletal: Strength & Muscle Tone: within normal limits Gait & Station: normal Patient leans: N/A   [facility] MSE Discharge Disposition for Follow up and Recommendations: Based on my evaluation the patient does not appear to have an emergency medical condition and can be discharged with resources and follow up care in outpatient services for Medication Management and Group Therapy.   De Burrs, NP 11/05/2023, 1:05 PM

## 2023-11-05 NOTE — ED Notes (Addendum)
 Patient Is discharging at this time. Printed AVS reviewed with patient along with follow up appointments and resources. Patient denies SI, HI, and A/V/H. Valuables/belongings returned to patient. No s/s of current distress.

## 2023-11-05 NOTE — Discharge Instructions (Signed)
 You are encouraged to follow up with [facility] for outpatient treatment.  Walk in/ Open Access Hours: Monday - Friday 8AM - 11AM (To see provider and therapist) Friday - 1PM - 4PM (To see therapist only)  [facility] [address] [place], [place] [phone] are encouraged to follow up with [facility] for outpatient treatment.  Walk in/ Open Access Hours: Monday - Friday 8AM - 11AM (To see provider and therapist) Friday - 1PM - 4PM (To see therapist only)  [facility] [address] [place], [place] [phone]

## 2023-11-07 ENCOUNTER — Ambulatory Visit (INDEPENDENT_AMBULATORY_CARE_PROVIDER_SITE_OTHER): Admitting: Physician Assistant

## 2023-11-07 ENCOUNTER — Encounter (HOSPITAL_COMMUNITY): Payer: Self-pay | Admitting: Physician Assistant

## 2023-11-07 VITALS — BP 125/99 | HR 73 | Ht 74.0 in | Wt 211.0 lb

## 2023-11-07 DIAGNOSIS — F411 Generalized anxiety disorder: Secondary | ICD-10-CM | POA: Diagnosis not present

## 2023-11-07 DIAGNOSIS — F250 Schizoaffective disorder, bipolar type: Secondary | ICD-10-CM | POA: Diagnosis not present

## 2023-11-07 NOTE — Progress Notes (Signed)
 Psychiatric Initial Adult Assessment   Patient Identification: William Romero MRN:  [id number] Date of Evaluation:  11/07/2023 Referral Source: Walk-in/referred by [facility] Chief Complaint:   Chief Complaint  Patient presents with   Follow-up   Medication Management   Visit Diagnosis:    ICD-10-CM   1. GAD (generalized anxiety disorder)  F41.1     2. Schizoaffective disorder, bipolar type (HCC)  F25.0       History of Present Illness:    William Hevia. Delduca "[name]" is a 43 year old male with a past psychiatric history significant for schizoaffective disorder (bipolar type) who presents to [facility] to establish psychiatric care for medication management.  Per chart review, patient was recently seen at [facility] due to auditory hallucinations.  Patient was also recently discharged from [facility] on 10/12/2023.  Patient presents to the encounter stating that he was placed on medication that does not seem to be working.  He reports that he does not have any more medication at this time and has been without medication for a couple of days.  When he was taking his medications regularly, patient reports that they did not take care of his problems.  Patient continues to endorse having auditory hallucinations.  He reports that his auditory hallucinations are characterized by voices of immorality.  He describes his auditory hallucinations as a black lesbian male that does not speak with boundaries.  He believes that his voices started 5 years ago when he was homeless due to lack of social interaction.  Patient denies visual hallucinations.  Patient denies overt depressive symptoms at this time.  He does endorse anxiety due to frustrations over his auditory hallucinations.  Patient does not remember the past medications he was on but states that when he was  assessed at [facility], he was recommended being placed on Clozaril or a long-acting injectable for his issues.  Per chart review, patient initially arrived to [facility] on 10/03/2023 for a brief suicidal ideations without plan, worsening auditory hallucinations, and paranoia leading to job loss.  Patient was subsequently admitted to [facility] voluntarily on 10/03/2023 for stabilization of acute on chronic psychiatric conditions.  Patient was discharged on 10/12/2023 on the following psychiatric medications:  Benztropine 0.5 mg daily Depakote 2 tablets (1000 mg total) at bedtime Fluphenazine (Prolixin) 10 mg 2 times daily Hydroxyzine 50 mg 3 times daily as needed Propranolol 10 mg 3 times daily Trazodone 100 mg at bedtime  Patient has a past history of multiple hospitalizations due to mental health with his last hospitalization occurring on 10/03/2023.  Patient denies a past history of suicide attempt.  A GAD-7 screen was performed with the patient scoring a 21.  Patient is alert and oriented x 4, calm, cooperative, and fully engaged in conversation during the encounter.  He endorses good mood.  Patient exhibits anxious mood with congruent affect.  Patient denies suicidal or homicidal ideations.  He endorses auditory hallucinations stating that whenever he says something there is a counter to everything that he does.  Patient denies visual hallucinations at this time.  Patient denies paranoia or delusional thoughts.  He endorses poor sleep and receives on average 3 to 4 hours of sleep per night.  Patient endorses good appetite and eats on average 3 meals per day.  Patient denies alcohol consumption or tobacco use.  Patient endorses illicit drug use in the  form of marijuana.  Associated Signs/Symptoms: Depression Symptoms:  insomnia, psychomotor agitation, difficulty concentrating, impaired memory, recurrent thoughts of death, anxiety, disturbed  sleep, (Hypo) Manic Symptoms:  Distractibility, Impulsivity, Anxiety Symptoms:  Excessive Worry, Psychotic Symptoms:  Hallucinations: Auditory Paranoia, PTSD Symptoms: Negative  Past Psychiatric History:  Patient has a past psychiatric diagnosis significant for schizoaffective disorder (bipolar type)  Patient has a past history of hospitalization due to mental health - Patient was most recently hospitalized at [facility] on 10/03/2023  Patient denies a past history of suicide attempt  Patient denies a past history of homicide attempt  Previous Psychotropic Medications: Yes , patient was placed on the following psychiatric medications following discharge from [facility]:  Benztropine 0.5 mg daily Depakote 2 tablets (1000 mg total) at bedtime Fluphenazine (Prolixin) 10 mg 2 times daily Hydroxyzine 50 mg 3 times daily as needed Propranolol 10 mg 3 times daily Trazodone 100 mg at bedtime  Psychiatric medication history/compliance: Per chart review, poor with the exception of Adderall. Full med trials include: adderall, bupropion, duloxetine, haloperidol, hydroxyzine (ineffective), lorazepam, olanzapine, ziprasidone, paliperidone, risperidone  Substance Abuse History in the last 12 months:  Yes.  Patient's most recent drug screen was positive for marijuana and amphetamines  Consequences of Substance Abuse: Patient denies Patient's most recent drug screen was positive for marijuana and amphetamines  Past Medical History:  Past Medical History:  Diagnosis Date   Paranoid schizophrenia (HCC) 10/03/2023   Schizophrenia (HCC)     Past Surgical History:  Procedure Laterality Date   APPENDECTOMY      Family Psychiatric History:  Patient reports that his grandmother is mentally ill  Family history of suicide attempt: Patient denies Family history of homicide attempt: Patient is unsure Family history of substance abuse: Patient denies  Family History: History  reviewed. No pertinent family history.  Social History:   Social History   Socioeconomic History   Marital status: Single    Spouse name: Not on file   Number of children: Not on file   Years of education: Not on file   Highest education level: Not on file  Occupational History   Not on file  Tobacco Use   Smoking status: Former    Current packs/day: 0.50    Types: Cigarettes   Smokeless tobacco: Never  Vaping Use   Vaping status: Never Used  Substance and Sexual Activity   Alcohol use: Not Currently    Alcohol/week: 5.0 standard drinks of alcohol    Types: 5 Cans of beer per week   Drug use: No   Sexual activity: Not on file  Other Topics Concern   Not on file  Social History Narrative   Not on file   Social Drivers of Health   Financial Resource Strain: Not on file  Food Insecurity: No Food Insecurity (10/03/2023)   Hunger Vital Sign    Worried About Running Out of Food in the Last Year: Never true    Ran Out of Food in the Last Year: Never true  Transportation Needs: No Transportation Needs (10/03/2023)   PRAPARE - Administrator, Civil Service (Medical): No    Lack of Transportation (Non-Medical): No  Physical Activity: Not on file  Stress: Not on file  Social Connections: Not on file    Additional Social History:  Patient denies social support.  Patient denies having children of his own.  Patient denies housing and is currently living in his car.  Patient is currently unemployed but  looking for work.  Patient denies a past history of military experience.  Patient endorses a past history of jail time stating that he was last in jail 10 years ago.  Patient has completed some college courses and has his associates degree.  Patient denies access to weapons.  Allergies:  No Known Allergies  Metabolic Disorder Labs: Lab Results  Component Value Date   HGBA1C 5.1 10/03/2023   MPG 99.67 10/03/2023   MPG 96.8 02/13/2023   No results found for:  "PROLACTIN" Lab Results  Component Value Date   CHOL 157 10/03/2023   TRIG 76 10/03/2023   HDL 44 10/03/2023   CHOLHDL 3.6 10/03/2023   VLDL 15 10/03/2023   LDLCALC 98 10/03/2023   LDLCALC 77 02/13/2023   Lab Results  Component Value Date   TSH 0.641 10/03/2023    Therapeutic Level Labs: No results found for: "LITHIUM" No results found for: "CBMZ" No results found for: "VALPROATE"  Current Medications: Current Outpatient Medications  Medication Sig Dispense Refill   amLODipine (NORVASC) 5 MG tablet Take 1 tablet (5 mg total) by mouth daily. 30 tablet 0   benztropine (COGENTIN) 0.5 MG tablet Take 1 tablet (0.5 mg total) by mouth daily. 30 tablet 0   divalproex (DEPAKOTE ER) 500 MG 24 hr tablet Take 2 tablets (1,000 mg total) by mouth at bedtime. 60 tablet 0   fluPHENAZine (PROLIXIN) 10 MG tablet Take 1 tablet (10 mg total) by mouth 2 (two) times daily. 60 tablet 0   hydrOXYzine (ATARAX) 50 MG tablet Take 1 tablet (50 mg total) by mouth 3 (three) times daily as needed for anxiety. 60 tablet 0   nicotine polacrilex (NICORETTE) 2 MG gum Take 1 each (2 mg total) by mouth as needed for smoking cessation. 100 tablet 0   propranolol (INDERAL) 10 MG tablet Take 1 tablet (10 mg total) by mouth 3 (three) times daily. 90 tablet 0   traZODone (DESYREL) 100 MG tablet Take 1 tablet (100 mg total) by mouth at bedtime. 30 tablet 0   No current facility-administered medications for this visit.    Musculoskeletal: Strength & Muscle Tone: within normal limits Gait & Station: normal Patient leans: N/A  Psychiatric Specialty Exam: Review of Systems  Psychiatric/Behavioral:  Positive for hallucinations (Auditory) and sleep disturbance. Negative for decreased concentration, dysphoric mood, self-injury and suicidal ideas. The patient is nervous/anxious. The patient is not hyperactive.     Blood pressure (!) 125/99, pulse 73, height 6\' 2"  (1.88 m), weight 211 lb (95.7 kg), SpO2 100%.Body mass  index is 27.09 kg/m.  General Appearance: Casual  Eye Contact:  Good  Speech:  Clear and Coherent and Normal Rate  Volume:  Normal  Mood:  Anxious  Affect:  Congruent  Thought Process:  Coherent, Goal Directed, and Descriptions of Associations: Intact  Orientation:  Full (Time, Place, and Person)  Thought Content:  Hallucinations: Auditory  Suicidal Thoughts:  No  Homicidal Thoughts:  No  Memory:  Immediate;   Good Recent;   Good Remote;   Good  Judgement:  Good  Insight:  Fair  Psychomotor Activity:  Normal  Concentration:  Concentration: Good and Attention Span: Good  Recall:  Good  Fund of Knowledge:Good  Language: Good  Akathisia:  No  Handed:  Right  AIMS (if indicated):  not done  Assets:  Communication Skills Desire for Improvement Transportation  ADL's:  Intact  Cognition: WNL  Sleep:  Fair   Screenings: AIMS    Flowsheet Row [facility] Ad[facility] (Discharged) [facility] Ad[facility] (Discharged) [facility]  AIMS Total Score 0 0 0      AUDIT    Flowsheet Row Ad[facility] ([facility]) [facility] Admission (Discharged) [facility] Admission (Discharged) [facility]  Alcohol Use Disorder Identification Test Final Score (AUDIT) 2 0 2      GAD-7    Flowsheet Row Office Visit from 11/07/2023 in [facility] Counselor from 04/20/2023 in [facility] Office Visit from 04/03/2023 in [facility] Counselor from 03/30/2023 in [facility] Office Visit from 09/26/2022 in [facility] [place]  Total GAD-7 Score 21 13  20 14  0      PHQ2-9    Flowsheet Row Office Visit from 11/07/2023 in [facility] Counselor from 04/20/2023 in [facility] Office Visit from 04/03/2023 in [facility] Counselor from 03/30/2023 in [facility] Video Visit from 03/22/2023 in [facility] [place]  PHQ-2 Total Score 1 1 5 2  0  PHQ-9 Total Score -- 8 14 9  0      Flowsheet Row Office Visit from 11/07/2023 in [facility] ED from 11/05/2023 in [facility] Admission (Discharged) [facility]  C-SSRS RISK CATEGORY No Risk No Risk No Risk       Assessment and Plan:   William Lansdowne. Santibanez "[name]" is a 43 year old male with a past psychiatric history significant for schizoaffective disorder (bipolar type) who presents to [facility] to establish psychiatric care for medication management. Per chart review, patient was recently seen at [facility] due to auditory hallucinations.  Patient was also recently discharged from [facility] on 10/12/2023.  Patient presents to the encounter struggling with auditory hallucinations.  He reports that he has been struggling with auditory hallucinations since being discharged from the hospital.  Patient was previously hospitalized at [facility] on 10/03/2023 and was discharged on 10/12/2023 on the following psychiatric medications:  Benztropine 0.5 mg daily Depakote 2 tablets (1000 mg total) at bedtime Fluphenazine (Prolixin) 10 mg 2 times daily Hydroxyzine 50 mg 3 times daily as needed Propranolol 10 mg 3 times daily Trazodone 100 mg at bedtime  Patient reports that he has since ran out of his medications and has been without them for a couple of days.  Patient reports that even when  he was taking his medications, he felt that they did not take care of his problems.  Patient is interested in being placed on Clozaril stating that a provider suggested to him when he was assessed at [facility].  Although patient has been on several psychiatric medications in the past, it is unclear if the patient was compliant with his medications.  Provider to discuss with patient the option of rechallenging fluphenazine for the management of his auditory hallucinations.  Provider to inform patient that he should be on the medication for at least 4 to 6 weeks to determine its efficacy before trying another medication such as Clozaril.  Patient was discharged from  this facility denying suicidal or homicidal ideations.  Although patient endorses some auditory hallucinations, he denies being a danger to himself.  Collaboration of Care: Psychiatrist AEB patient being followed by a mental health provider at this facility  Patient/Guardian was advised Release of Information must be obtained prior to any record release in order to collaborate their care with an outside provider. Patient/Guardian was advised if they have not already done so to contact the registration department to sign all necessary forms in order for [place] to release information regarding their care.   Consent: Patient/Guardian gives verbal consent for treatment and assignment of benefits for services provided during this visit. Patient/Guardian expressed understanding and agreed to proceed.   1. GAD (generalized anxiety disorder) (Primary)  2. Schizoaffective disorder, bipolar type [facility]) Patient is interested in Clozaril trial  Patient has been on several psychiatric medications in the past; however, compliance history with medication is unknown. - Psychiatric medication history/compliance: Per chart review, poor with the exception of Adderall. Full med trials include: adderall, bupropion, duloxetine,  haloperidol, hydroxyzine (ineffective), lorazepam, olanzapine, ziprasidone, paliperidone, risperidone  Provider to consider rechallenging Prolixin (fluphenazine) due to patient prematurely running out of the medication shortly after being discharged from the hospital.  Patient to follow up in 6 weeks Provider spent a total of 35 minutes with the patient/reviewing patient's chart  Meta Hatchet, PA 3/4/20256:55 PM

## 2023-11-09 ENCOUNTER — Telehealth (HOSPITAL_COMMUNITY): Payer: Self-pay | Admitting: *Deleted

## 2023-11-09 NOTE — Telephone Encounter (Signed)
 Patient called asking for a return call to discuss his medication. Did not give details just that he wanted to ask some questions after meeting with him yesterday.

## 2023-11-13 ENCOUNTER — Telehealth (HOSPITAL_COMMUNITY): Payer: Self-pay | Admitting: Physician Assistant

## 2023-11-13 NOTE — Telephone Encounter (Signed)
 Pt came in this morning as a walk in stated provider was to give him a call regarding meds. Please give patient a call ASAP. Thanks!

## 2023-11-14 ENCOUNTER — Ambulatory Visit (INDEPENDENT_AMBULATORY_CARE_PROVIDER_SITE_OTHER): Admitting: Physician Assistant

## 2023-11-14 ENCOUNTER — Encounter (HOSPITAL_COMMUNITY): Payer: Self-pay | Admitting: Physician Assistant

## 2023-11-14 VITALS — BP 140/102 | HR 84 | Temp 98.5°F | Ht 74.0 in | Wt 207.8 lb

## 2023-11-14 DIAGNOSIS — F411 Generalized anxiety disorder: Secondary | ICD-10-CM

## 2023-11-14 DIAGNOSIS — F250 Schizoaffective disorder, bipolar type: Secondary | ICD-10-CM | POA: Diagnosis not present

## 2023-11-14 DIAGNOSIS — F909 Attention-deficit hyperactivity disorder, unspecified type: Secondary | ICD-10-CM | POA: Diagnosis not present

## 2023-11-14 MED ORDER — FLUPHENAZINE HCL 10 MG PO TABS: 10.0000 mg | ORAL_TABLET | Freq: Two times a day (BID) | ORAL | 1 refills | Status: DC

## 2023-11-14 MED ORDER — DIVALPROEX SODIUM ER 500 MG PO TB24: 1000.0000 mg | ORAL_TABLET | Freq: Every day | ORAL | 1 refills | Status: DC

## 2023-11-14 MED ORDER — PROPRANOLOL HCL 10 MG PO TABS: 10.0000 mg | ORAL_TABLET | Freq: Three times a day (TID) | ORAL | 1 refills | Status: DC

## 2023-11-14 MED ORDER — HYDROXYZINE HCL 50 MG PO TABS: 50.0000 mg | ORAL_TABLET | Freq: Three times a day (TID) | ORAL | 1 refills | Status: DC | PRN

## 2023-11-14 MED ORDER — TRAZODONE HCL 100 MG PO TABS: 100.0000 mg | ORAL_TABLET | Freq: Every day | ORAL | 1 refills | Status: DC

## 2023-11-14 MED ORDER — BENZTROPINE MESYLATE 0.5 MG PO TABS: 0.5000 mg | ORAL_TABLET | Freq: Every day | ORAL | 1 refills | Status: DC

## 2023-11-14 NOTE — Progress Notes (Unsigned)
 BH MD/PA/NP OP Progress Note  11/14/2023 8:56 PM William Romero  MRN:  [id number]  Chief Complaint:  Chief Complaint  Patient presents with   Follow-up   Medication Management   HPI: ***  William Romero. [employer]"  Visit Diagnosis:    ICD-10-CM   1. Schizoaffective disorder, bipolar type (HCC)  F25.0 traZODone (DESYREL) 100 MG tablet    fluPHENAZine (PROLIXIN) 10 MG tablet    divalproex (DEPAKOTE ER) 500 MG 24 hr tablet    benztropine (COGENTIN) 0.5 MG tablet    2. GAD (generalized anxiety disorder)  F41.1 hydrOXYzine (ATARAX) 50 MG tablet    propranolol (INDERAL) 10 MG tablet    3. Attention deficit hyperactivity disorder (ADHD), unspecified ADHD type  F90.9       Past Psychiatric History:  Patient has a past psychiatric diagnosis significant for schizoaffective disorder (bipolar type)   Patient has a past history of hospitalization due to mental health - Patient was most recently hospitalized at [facility] on 10/03/2023   Patient denies a past history of suicide attempt   Patient denies a past history of homicide attempt  Past Medical History:  Past Medical History:  Diagnosis Date   Paranoid schizophrenia (HCC) 10/03/2023   Schizophrenia (HCC)     Past Surgical History:  Procedure Laterality Date   APPENDECTOMY      Family Psychiatric History:  Patient reports that his grandmother is mentally ill   Family history of suicide attempt: Patient denies Family history of homicide attempt: Patient is unsure Family history of substance abuse: Patient denies  Family History: History reviewed. No pertinent family history.  Social History:  Social History   Socioeconomic History   Marital status: Single    Spouse name: Not on file   Number of children: Not on file   Years of education: Not on file   Highest education level: Not on file  Occupational History   Not on file  Tobacco Use   Smoking status: Former    Current packs/day:  0.50    Types: Cigarettes   Smokeless tobacco: Never  Vaping Use   Vaping status: Never Used  Substance and Sexual Activity   Alcohol use: Not Currently    Alcohol/week: 5.0 standard drinks of alcohol    Types: 5 Cans of beer per week   Drug use: No   Sexual activity: Not on file  Other Topics Concern   Not on file  Social History Narrative   Not on file   Social Drivers of Health   Financial Resource Strain: Not on file  Food Insecurity: No Food Insecurity (10/03/2023)   Hunger Vital Sign    Worried About Running Out of Food in the Last Year: Never true    Ran Out of Food in the Last Year: Never true  Transportation Needs: No Transportation Needs (10/03/2023)   PRAPARE - Administrator, Civil Service (Medical): No    Lack of Transportation (Non-Medical): No  Physical Activity: Not on file  Stress: Not on file  Social Connections: Not on file    Allergies: No Known Allergies  Metabolic Disorder Labs: Lab Results  Component Value Date   HGBA1C 5.1 10/03/2023   MPG 99.67 10/03/2023   MPG 96.8 02/13/2023   No results found for: "PROLACTIN" Lab Results  Component Value Date   CHOL 157 10/03/2023   TRIG 76 10/03/2023   HDL 44 10/03/2023   CHOLHDL 3.6 10/03/2023   VLDL 15 10/03/2023  LDLCALC 98 10/03/2023   LDLCALC 77 02/13/2023   Lab Results  Component Value Date   TSH 0.641 10/03/2023   TSH 0.426 02/13/2023    Therapeutic Level Labs: No results found for: "LITHIUM" No results found for: "VALPROATE" No results found for: "CBMZ"  Current Medications: Current Outpatient Medications  Medication Sig Dispense Refill   amLODipine (NORVASC) 5 MG tablet Take 1 tablet (5 mg total) by mouth daily. 30 tablet 0   benztropine (COGENTIN) 0.5 MG tablet Take 1 tablet (0.5 mg total) by mouth daily. 30 tablet 1   divalproex (DEPAKOTE ER) 500 MG 24 hr tablet Take 2 tablets (1,000 mg total) by mouth at bedtime. 60 tablet 1   fluPHENAZine (PROLIXIN) 10 MG tablet  Take 1 tablet (10 mg total) by mouth 2 (two) times daily. 60 tablet 1   hydrOXYzine (ATARAX) 50 MG tablet Take 1 tablet (50 mg total) by mouth 3 (three) times daily as needed for anxiety. 75 tablet 1   nicotine polacrilex (NICORETTE) 2 MG gum Take 1 each (2 mg total) by mouth as needed for smoking cessation. 100 tablet 0   propranolol (INDERAL) 10 MG tablet Take 1 tablet (10 mg total) by mouth 3 (three) times daily. 90 tablet 1   traZODone (DESYREL) 100 MG tablet Take 1 tablet (100 mg total) by mouth at bedtime. 30 tablet 1   No current facility-administered medications for this visit.     Musculoskeletal: Strength & Muscle Tone: within normal limits Gait & Station: normal Patient leans: N/A  Psychiatric Specialty Exam: Review of Systems  Psychiatric/Behavioral:  Positive for hallucinations (Auditory) and sleep disturbance. Negative for decreased concentration, dysphoric mood, self-injury and suicidal ideas. The patient is nervous/anxious. The patient is not hyperactive.     Blood pressure (!) 140/102, pulse 84, temperature 98.5 F (36.9 C), temperature source Oral, height 6\' 2"  (1.88 m), weight 207 lb 12.8 oz (94.3 kg), SpO2 100%.Body mass index is 26.68 kg/m.  General Appearance: Casual  Eye Contact:  Good  Speech:  Clear and Coherent and Normal Rate  Volume:  Normal  Mood:  Anxious  Affect:  Congruent  Thought Process:  Coherent, Goal Directed, and Descriptions of Associations: Intact  Orientation:  Full (Time, Place, and Person)  Thought Content: Hallucinations: Auditory   Suicidal Thoughts:  No  Homicidal Thoughts:  No  Memory:  Immediate;   Good Recent;   Good Remote;   Good  Judgement:  Fair  Insight:  Fair  Psychomotor Activity:  Normal  Concentration:  Concentration: Good and Attention Span: Good  Recall:  Fair  Fund of Knowledge: Good  Language: Good  Akathisia:  No  Handed:  Right  AIMS (if indicated): not done  Assets:  Communication Skills Desire for  Improvement Transportation  ADL's:  Intact  Cognition: WNL  Sleep:  Fair   Screenings: AIMS    Flowsheet Row [facility] Ad[facility] ([facility]) [facility]  AIMS Total Score 0 0 0      AUDIT    Flowsheet Row [facility]) [facility] (Dis[facility] Ad[facility] ([facility]  Alcohol Use Disorder Identification Test Final Score (AUDIT) 2 0 2      GAD-7    Flowsheet  Row Clinical Support from 11/14/2023 in [facility] Office Visit from 11/07/2023 in [facility] Counselor from 04/20/2023 in [facility] Office Visit from 04/03/2023 in [facility] Counselor from 03/30/2023 in [facility]  Total GAD-7 Score 17 21 13 20 14       [phone]    Flowsheet Row Clinical Support from 11/14/2023 in [facility] Office Visit from 11/07/2023 in [facility] Counselor from 04/20/2023 in [facility] Office Visit from 04/03/2023 in [facility] Counselor from 03/30/2023 in [facility]  PHQ-2 Total Score 0 1 1 5 2   PHQ-9 Total Score -- -- 8 14 9       Flowsheet Row Clinical Support from 11/14/2023 in [facility] Office Visit from 11/07/2023 in [facility] ED from 11/05/2023 in [facility]  C-SSRS RISK CATEGORY No Risk No Risk No Risk         Assessment and Plan: ***  ***  Collaboration of Care: Collaboration of Care: Medication Management AEB provider managing patient's psychiatric medications and Psychiatrist AEB patient being followed by mental health provider at this facility  Patient/Guardian was advised Release of Information must be obtained prior to any record release in order to collaborate their care with an outside provider. Patient/Guardian was advised if they have not already done so to contact the registration department to sign all necessary forms in order for [place] to release information regarding their care.   Consent: Patient/Guardian gives verbal consent for treatment and assignment of benefits for services provided during this visit. Patient/Guardian expressed understanding and agreed to proceed.   1. Schizoaffective disorder, bipolar type (HCC) (Primary)  - traZODone (DESYREL) 100 MG tablet; Take 1 tablet (100 mg total) by mouth at bedtime.  Dispense: 30 tablet; Refill: 1 - fluPHENAZine (PROLIXIN) 10 MG tablet; Take 1 tablet (10 mg total) by mouth 2 (two) times daily.  Dispense: 60 tablet; Refill: 1 - divalproex (DEPAKOTE ER) 500 MG 24 hr tablet; Take 2 tablets (1,000 mg total) by mouth at bedtime.  Dispense: 60 tablet; Refill: 1 - benztropine (COGENTIN) 0.5 MG tablet; Take 1 tablet (0.5 mg total) by mouth daily.  Dispense: 30 tablet; Refill: 1  2. GAD (generalized anxiety disorder)  - hydrOXYzine (ATARAX) 50 MG tablet; Take 1 tablet (50 mg total) by mouth 3 (three) times daily as needed for anxiety.  Dispense: 75 tablet; Refill: 1 - propranolol (INDERAL) 10 MG tablet; Take 1 tablet (10 mg total) by mouth 3 (three) times daily.  Dispense: 90 tablet; Refill: 1  3. Attention deficit hyperactivity disorder (ADHD), unspecified ADHD type  Patient to follow up on 12/19/2023 Provider spent a total of 21 minutes with the patient/reviewing patient's chart  Meta Hatchet, PA 11/14/2023, 8:56 PM

## 2023-11-22 NOTE — Telephone Encounter (Signed)
 Message acknowledged and reviewed. Provider spoke with patient regarding medication management during his last appointment. Patient has a follow up appointment scheduled for 12/19/2023 at 10:30 AM.

## 2023-12-19 ENCOUNTER — Encounter (HOSPITAL_COMMUNITY): Payer: MEDICAID | Admitting: Physician Assistant

## 2024-01-11 ENCOUNTER — Other Ambulatory Visit: Payer: Self-pay

## 2024-01-11 ENCOUNTER — Emergency Department (HOSPITAL_COMMUNITY)
Admission: EM | Admit: 2024-01-11 | Discharge: 2024-01-13 | Disposition: A | Discharge status: Home / Self Care | Payer: Self-pay | Attending: Emergency Medicine | Admitting: Emergency Medicine

## 2024-01-11 DIAGNOSIS — F209 Schizophrenia, unspecified: Secondary | ICD-10-CM | POA: Insufficient documentation

## 2024-01-11 DIAGNOSIS — R440 Auditory hallucinations: Secondary | ICD-10-CM

## 2024-01-11 LAB — COMPREHENSIVE METABOLIC PANEL WITH GFR
ALT: 17 U/L (ref 0–44)
AST: 21 U/L (ref 15–41)
Albumin: 3.5 g/dL (ref 3.5–5.0)
Alkaline Phosphatase: 59 U/L (ref 38–126)
Anion gap: 7 (ref 5–15)
BUN: 10 mg/dL (ref 6–20)
CO2: 23 mmol/L (ref 22–32)
Calcium: 8.5 mg/dL — ABNORMAL LOW (ref 8.9–10.3)
Chloride: 110 mmol/L (ref 98–111)
Creatinine, Ser: 0.82 mg/dL (ref 0.61–1.24)
GFR, Estimated: >60 mL/min (ref >60)
Glucose, Bld: 102 mg/dL — ABNORMAL HIGH (ref 70–99)
Potassium: 4.1 mmol/L (ref 3.5–5.1)
Sodium: 140 mmol/L (ref 135–145)
Total Bilirubin: 0.5 mg/dL (ref 0.0–1.2)
Total Protein: 6.3 g/dL — ABNORMAL LOW (ref 6.5–8.1)

## 2024-01-11 LAB — CBC
HCT: 43.6 % (ref 39.0–52.0)
Hemoglobin: 13.6 g/dL (ref 13.0–17.0)
MCH: 29.4 pg (ref 26.0–34.0)
MCHC: 31.2 g/dL (ref 30.0–36.0)
MCV: 94.4 fL (ref 80.0–100.0)
Platelets: 330 10*3/uL (ref 150–400)
RBC: 4.62 MIL/uL (ref 4.22–5.81)
RDW: 14 % (ref 11.5–15.5)
WBC: 8.2 10*3/uL (ref 4.0–10.5)
nRBC: 0 % (ref 0.0–0.2)

## 2024-01-11 LAB — RAPID URINE DRUG SCREEN, HOSP PERFORMED
Amphetamines: POSITIVE — AB
Barbiturates: NOT DETECTED
Benzodiazepines: NOT DETECTED
Cocaine: NOT DETECTED
Opiates: NOT DETECTED
Tetrahydrocannabinol: POSITIVE — AB

## 2024-01-11 LAB — ETHANOL: Alcohol, Ethyl (B): <15 mg/dL (ref <15)

## 2024-01-11 MED ORDER — DIPHENHYDRAMINE HCL 50 MG/ML IJ SOLN: 25.0000 mg | Freq: Four times a day (QID) | INTRAMUSCULAR | Status: DC | PRN

## 2024-01-11 MED ORDER — HALOPERIDOL LACTATE 5 MG/ML IJ SOLN
5.0000 mg | Freq: Three times a day (TID) | INTRAMUSCULAR | Status: DC | PRN
Start: 2024-01-11 — End: 2024-01-13

## 2024-01-11 MED ORDER — HALOPERIDOL 5 MG PO TABS
5.0000 mg | ORAL_TABLET | Freq: Three times a day (TID) | ORAL | Status: DC | PRN
  Filled 2024-01-11 (×2): qty 1

## 2024-01-11 MED ORDER — LORAZEPAM 1 MG PO TABS
1.0000 mg | ORAL_TABLET | Freq: Three times a day (TID) | ORAL | Status: DC | PRN
  Administered 2024-01-12 – 2024-01-13 (×3): 1 mg via ORAL
  Filled 2024-01-11 (×4): qty 1

## 2024-01-11 MED ORDER — OLANZAPINE 5 MG PO TBDP
5.0000 mg | ORAL_TABLET | Freq: Once | ORAL | Status: AC
  Administered 2024-01-12: 5 mg via ORAL
  Filled 2024-01-11 (×2): qty 1

## 2024-01-11 MED ORDER — OLANZAPINE 10 MG PO TBDP
10.0000 mg | ORAL_TABLET | Freq: Every day | ORAL | Status: DC
  Administered 2024-01-11 – 2024-01-12 (×2): 10 mg via ORAL
  Filled 2024-01-11 (×2): qty 1

## 2024-01-11 MED ORDER — OLANZAPINE 5 MG PO TBDP
5.0000 mg | ORAL_TABLET | Freq: Once | ORAL | Status: AC
  Administered 2024-01-11: 5 mg via ORAL
  Filled 2024-01-11: qty 1

## 2024-01-11 MED ORDER — DIPHENHYDRAMINE HCL 25 MG PO CAPS: 25.0000 mg | ORAL_CAPSULE | Freq: Three times a day (TID) | ORAL | Status: DC | PRN

## 2024-01-11 NOTE — ED Triage Notes (Signed)
 Pt reports auditory hallucination over the past 6 months. Sts they have worsened today. Voices are telling him bad words. Endorses ETOH.

## 2024-01-11 NOTE — ED Notes (Signed)
 Patient refused to speak with psych states he wants to see a doctor, Murlean Armour offered meds and other sources pt. still refused, ED MD made aware

## 2024-01-11 NOTE — ED Notes (Signed)
 Patient to 40.  Patient ambulated to room. Patient cooperative    Patient oriented to room.

## 2024-01-11 NOTE — ED Notes (Signed)
Patient is resting

## 2024-01-11 NOTE — Consult Note (Signed)
 Provider attempted to speak with patient for evaluation and plan of care.  Patient refused to speak with Provider stating "I came her for hearing voices from male from my past"  Patient then asked Provider if she is a doctor and if not to leave and bring a doctor.  Patient stopped talking to provider.  UDS is positive for Cannabis and Amphetamine .

## 2024-01-11 NOTE — ED Notes (Signed)
 Pts belongings removed (1 bag) and placed in [place] D cabinet. Pt wear blue paper scrubs. Pt wanded by security.

## 2024-01-11 NOTE — BH Assessment (Signed)
 Patient was deferred to [facility] for a telepsych assessment. The assigned care coordinator will provide updates regarding the scheduling of the assessment. [facility] coordinator can be reached at [phone] for further information on the timing of the telepsych evaluation.

## 2024-01-11 NOTE — ED Provider Notes (Signed)
  [facility]-EMERGENCY DEPT [facility] Emergency Department Provider Note MRN:  [id number]  Arrival date & time: 01/11/24     Chief Complaint   Hallucinations   History of Present Illness   William Romero is a 43 y.o. year-old male presents to the ED with chief complaint of hallucinations.  He states that he is hearing multiple voices that are "telling him bad words."  He denies SI or HI.  He states that he is medicated for the symptoms, but his medications have not been helping.  He denies any recent illnesses.  Denies any other complaints.  History provided by patient.   Review of Systems  Pertinent positive and negative review of systems noted in HPI.    Physical Exam   Vitals:   01/11/24 0302  BP: (!) 142/99  Pulse: 72  Resp: 19  Temp: 97.7 F (36.5 C)  SpO2: 100%    CONSTITUTIONAL:  non toxic-appearing, NAD NEURO:  Alert and oriented x 3, CN 3-12 grossly intact EYES:  eyes equal and reactive ENT/NECK:  Supple, no stridor  CARDIO:  normal rate, regular rhythm, appears well-perfused  PULM:  No respiratory distress, CTAB GI/GU:  non-distended,  MSK/SPINE:  No gross deformities, no edema, moves all extremities  SKIN:  no rash, atraumatic   *Additional and/or pertinent findings included in MDM below  Diagnostic and Interventional Summary    EKG Interpretation Date/Time:    Ventricular Rate:    PR Interval:    QRS Duration:    QT Interval:    QTC Calculation:   R Axis:      Text Interpretation:         Labs Reviewed  COMPREHENSIVE METABOLIC PANEL WITH GFR - Abnormal; Notable for the following components:      Result Value   Glucose, Bld 102 (*)    Calcium 8.5 (*)    Total Protein 6.3 (*)    All other components within normal limits  RAPID URINE DRUG SCREEN, HOSP PERFORMED - Abnormal; Notable for the following components:   Amphetamines POSITIVE (*)    Tetrahydrocannabinol POSITIVE (*)    All other components within normal limits  ETHANOL  CBC     No orders to display    Medications - No data to display   Procedures  /  Critical Care Procedures  ED Course and Medical Decision Making  I have reviewed the triage vital signs, the nursing notes, and pertinent available records from the EMR.  Social Determinants Affecting Complexity of Care: Patient has no clinically significant social determinants affecting this chief complaint..   ED Course:    Medical Decision Making Amount and/or Complexity of Data Reviewed Labs: ordered.         Consultants: TTS consult pending.   Treatment and Plan: Dispo pending TTS consult.    Final Clinical Impressions(s) / ED Diagnoses     ICD-10-CM   1. Auditory hallucination  R44.0       ED Discharge Orders     None         Discharge Instructions Discussed with and Provided to Patient:   Discharge Instructions   None      Sherel Dikes, PA-C 01/11/24 1308    Palumbo, April, MD 01/11/24 [phone]

## 2024-01-11 NOTE — ED Provider Notes (Addendum)
 William Romero, assumed care for this patient.  In brief 43 year old male here today for hearing voices.  Patient with history of schizophrenia.  Tells me that he is hearing voices which he believes are witchcraft telling him to commit violent acts such as rape and murder.  On my assessment the patient, he is exhibiting psychosis.  Have ordered an IVC on the patient.  Patient has intermittently declined medications and speaking with a specific psychiatry nurse practitioner due to that individual has been in the same demographics as what the patient believes the voices in his head.  I have discussed this with the psychiatry team, they are working on the video call for the patient.   Afton Horse T, DO 01/11/24 1021    Afton Horse T, DO 01/11/24 [phone]

## 2024-01-11 NOTE — ED Notes (Signed)
 Lunch tray given.

## 2024-01-12 ENCOUNTER — Encounter (HOSPITAL_COMMUNITY): Payer: Self-pay | Admitting: Psychiatric/Mental Health

## 2024-01-12 NOTE — ED Notes (Signed)
 Pt is sleeping in NAD. Video monitoring continues.

## 2024-01-12 NOTE — BH Assessment (Addendum)
@   06:61 Faith with [facility] said that they would turn over patient to in house provider.  @ 06:27 [facility] provider Katlin Hunt, NP did attempt to see patient but there was no answer on the monitor after multiple attempts to contact patient.   @ 05:54 RN had documented that patient was lethargic and patient would not be able to participate in assessment.   05/09:[facility] scheduled to see patient at 6:15am 05/08: deferred to [facility]

## 2024-01-12 NOTE — ED Notes (Signed)
Pt up to use bathroom at this time

## 2024-01-12 NOTE — ED Provider Notes (Signed)
 Emergency Medicine Observation Re-evaluation Note  William Romero is a 43 y.o. male, seen on rounds today.  Pt initially presented to the ED for complaints of Hallucinations Currently, the patient is resting comfortably.  Physical Exam  BP (!) 142/90 (BP Location: Right Arm)   Pulse 70   Temp 97.9 F (36.6 C) (Oral)   Resp 17   Ht 6\' 2"  (1.88 m)   Wt 93.9 kg   SpO2 99%   BMI 26.58 kg/m  Physical Exam Vitals and nursing note reviewed.  Constitutional:      General: He is not in acute distress.    Appearance: He is well-developed.  HENT:     Head: Normocephalic and atraumatic.  Eyes:     Conjunctiva/sclera: Conjunctivae normal.  Cardiovascular:     Rate and Rhythm: Normal rate and regular rhythm.     Heart sounds: No murmur heard. Pulmonary:     Effort: Pulmonary effort is normal. No respiratory distress.  Musculoskeletal:        General: No swelling.     Cervical back: Neck supple.  Skin:    General: Skin is warm and dry.  Neurological:     Mental Status: He is alert.  Psychiatric:        Mood and Affect: Mood normal.      ED Course / MDM  EKG:   I have reviewed the labs performed to date as well as medications administered while in observation.  Recent changes in the last 24 hours include attempted psych eval but patient too lethargic.Aaron Aas  Plan  Current plan is for continued psych evaluation.    Karlyn Overman, MD 01/12/24 [phone]

## 2024-01-12 NOTE — ED Notes (Signed)
 Telepsych monitor in room, patient reports that he is very lethargic and cannot stay awake for assessment,

## 2024-01-12 NOTE — ED Notes (Signed)
 Meal tray given

## 2024-01-13 ENCOUNTER — Inpatient Hospital Stay (HOSPITAL_COMMUNITY)
Admission: AD | Admit: 2024-01-13 | Discharge: 2024-01-23 | DRG: 885 | Disposition: A | Discharge status: Home / Self Care | Payer: MEDICAID | Source: Intra-hospital | Attending: Psychiatry | Admitting: Psychiatry

## 2024-01-13 DIAGNOSIS — F250 Schizoaffective disorder, bipolar type: Principal | ICD-10-CM | POA: Diagnosis present

## 2024-01-13 DIAGNOSIS — F17210 Nicotine dependence, cigarettes, uncomplicated: Secondary | ICD-10-CM | POA: Diagnosis present

## 2024-01-13 DIAGNOSIS — F209 Schizophrenia, unspecified: Principal | ICD-10-CM | POA: Diagnosis present

## 2024-01-13 DIAGNOSIS — F1590 Other stimulant use, unspecified, uncomplicated: Secondary | ICD-10-CM | POA: Diagnosis present

## 2024-01-13 DIAGNOSIS — F909 Attention-deficit hyperactivity disorder, unspecified type: Secondary | ICD-10-CM | POA: Diagnosis present

## 2024-01-13 DIAGNOSIS — F12259 Cannabis dependence with psychotic disorder, unspecified: Secondary | ICD-10-CM | POA: Insufficient documentation

## 2024-01-13 DIAGNOSIS — Z91148 Patient's other noncompliance with medication regimen for other reason: Secondary | ICD-10-CM

## 2024-01-13 DIAGNOSIS — F411 Generalized anxiety disorder: Secondary | ICD-10-CM | POA: Diagnosis present

## 2024-01-13 DIAGNOSIS — Z79899 Other long term (current) drug therapy: Secondary | ICD-10-CM

## 2024-01-13 DIAGNOSIS — F1520 Other stimulant dependence, uncomplicated: Secondary | ICD-10-CM | POA: Insufficient documentation

## 2024-01-13 DIAGNOSIS — F12159 Cannabis abuse with psychotic disorder, unspecified: Secondary | ICD-10-CM | POA: Diagnosis present

## 2024-01-13 DIAGNOSIS — I10 Essential (primary) hypertension: Secondary | ICD-10-CM | POA: Diagnosis present

## 2024-01-13 MED ORDER — HALOPERIDOL LACTATE 5 MG/ML IJ SOLN: 10.0000 mg | Freq: Three times a day (TID) | INTRAMUSCULAR | Status: DC | PRN

## 2024-01-13 MED ORDER — OLANZAPINE 5 MG PO TBDP: 5.0000 mg | ORAL_TABLET | Freq: Three times a day (TID) | ORAL | Status: DC | PRN

## 2024-01-13 MED ORDER — MIRTAZAPINE 15 MG PO TBDP
15.0000 mg | ORAL_TABLET | Freq: Every day | ORAL | Status: DC
  Administered 2024-01-13: 15 mg via ORAL
  Filled 2024-01-13: qty 1

## 2024-01-13 MED ORDER — HALOPERIDOL LACTATE 5 MG/ML IJ SOLN
5.0000 mg | Freq: Three times a day (TID) | INTRAMUSCULAR | Status: DC | PRN
  Administered 2024-01-17: 5 mg via INTRAMUSCULAR
  Filled 2024-01-13 (×2): qty 1

## 2024-01-13 MED ORDER — ACETAMINOPHEN 325 MG PO TABS
650.0000 mg | ORAL_TABLET | Freq: Four times a day (QID) | ORAL | Status: DC | PRN
  Administered 2024-01-18: 650 mg via ORAL
  Filled 2024-01-13: qty 2

## 2024-01-13 MED ORDER — MIRTAZAPINE 15 MG PO TBDP
15.0000 mg | ORAL_TABLET | Freq: Every day | ORAL | Status: DC
  Administered 2024-01-14 – 2024-01-22 (×9): 15 mg via ORAL
  Filled 2024-01-13 (×11): qty 1

## 2024-01-13 MED ORDER — DIPHENHYDRAMINE HCL 50 MG/ML IJ SOLN
50.0000 mg | Freq: Three times a day (TID) | INTRAMUSCULAR | Status: DC | PRN
  Administered 2024-01-17: 50 mg via INTRAMUSCULAR

## 2024-01-13 MED ORDER — DIPHENHYDRAMINE HCL 50 MG/ML IJ SOLN
50.0000 mg | Freq: Three times a day (TID) | INTRAMUSCULAR | Status: DC | PRN
  Filled 2024-01-13 (×2): qty 1

## 2024-01-13 MED ORDER — HALOPERIDOL 5 MG PO TABS
5.0000 mg | ORAL_TABLET | Freq: Three times a day (TID) | ORAL | Status: DC | PRN
  Administered 2024-01-13 – 2024-01-21 (×9): 5 mg via ORAL
  Filled 2024-01-13 (×10): qty 1

## 2024-01-13 MED ORDER — DIPHENHYDRAMINE HCL 25 MG PO CAPS
50.0000 mg | ORAL_CAPSULE | Freq: Three times a day (TID) | ORAL | Status: DC | PRN
  Administered 2024-01-13 – 2024-01-21 (×8): 50 mg via ORAL
  Filled 2024-01-13 (×9): qty 2

## 2024-01-13 MED ORDER — OLANZAPINE 5 MG PO TBDP
15.0000 mg | ORAL_TABLET | Freq: Every day | ORAL | Status: DC
  Administered 2024-01-13 (×2): 15 mg via ORAL
  Filled 2024-01-13 (×2): qty 1

## 2024-01-13 MED ORDER — LORAZEPAM 1 MG PO TABS
1.0000 mg | ORAL_TABLET | Freq: Three times a day (TID) | ORAL | Status: DC | PRN
  Administered 2024-01-14: 1 mg via ORAL
  Filled 2024-01-13: qty 1

## 2024-01-13 MED ORDER — DIVALPROEX SODIUM ER 500 MG PO TB24
500.0000 mg | ORAL_TABLET | Freq: Every day | ORAL | Status: DC
  Administered 2024-01-14 – 2024-01-19 (×6): 500 mg via ORAL
  Filled 2024-01-13 (×10): qty 1

## 2024-01-13 MED ORDER — LORAZEPAM 2 MG/ML IJ SOLN
2.0000 mg | Freq: Three times a day (TID) | INTRAMUSCULAR | Status: DC | PRN
  Filled 2024-01-13 (×2): qty 1

## 2024-01-13 MED ORDER — LORAZEPAM 2 MG/ML IJ SOLN
2.0000 mg | Freq: Three times a day (TID) | INTRAMUSCULAR | Status: DC | PRN
  Administered 2024-01-17: 2 mg via INTRAMUSCULAR

## 2024-01-13 MED ORDER — DIVALPROEX SODIUM ER 500 MG PO TB24
500.0000 mg | ORAL_TABLET | Freq: Every day | ORAL | Status: DC
  Administered 2024-01-13: 500 mg via ORAL
  Filled 2024-01-13: qty 1

## 2024-01-13 MED ORDER — OLANZAPINE 15 MG PO TBDP
15.0000 mg | ORAL_TABLET | Freq: Every day | ORAL | Status: DC
  Filled 2024-01-13 (×2): qty 1

## 2024-01-13 MED ORDER — ALUM & MAG HYDROXIDE-SIMETH 200-200-20 MG/5ML PO SUSP: 30.0000 mL | ORAL | Status: DC | PRN

## 2024-01-13 NOTE — ED Notes (Addendum)
 Pt was anxious this morning. Gave ativan  around 1300, but he really calmed down when I told him that had a room for him@ [facility] Called report, but they want second shift to take report since pt will arrive for second shift. VS in normal ranges. Pt in room sleeping. Food and drink intake has been adequate.  bed is 501-1. it will be available for pt to transfer tonight at 2000. DX Schizophrenia. Attending MD Zouev. IVC paperwork has been reviewed. RN TO RN report to [phone].

## 2024-01-13 NOTE — Progress Notes (Signed)
 Patient has been denied by [facility] due to no appropriate beds available. Patient meets BH inpatient criteria per Arvell Latin, NP. Patient has been faxed out to the following facilities:   [facility] HealthPending - Request [facility], [place] [phone]--BMWUX-LKGMWNUU [facility] - Request [address], [place] [phone]--PIRJJ-OACZYSAY [facility] - Request Sent--[address] [place], [place] [phone]--CBJSE-GBTDVV Health-Behavioral Health Patient PlacementPending - Request [facility], [facility] [phone]--[facility] HealthPending - Request Sent--[address] [place] [phone]--JIRCV-ELF Ellan Gunner - Request Sent--[address], [place] [phone]--[facility]-AdultPending - Request Sent--[address] [place] [phone]--ERXVQ-MGQQP [facility] HealthPending - Request [address], [place] [phone]--NLZJQ-BHALP [facility] - Request Sent--[address] [place] [phone]--QIWLN-LGXQJJHE [facility] - Request [address] [place] [phone]--YIFOY-DXAJOIN [facility] HealthPending - Request [address], [place] [phone]--YTKPT-WSFKCLE [facility] - Request [address] [place] [phone]--LDJTT-SVXB [facility] - Request Sent--[address] [place] [phone]--YBWLS-LHTDSKA [facility] - Request Sent--[address] [place]illia Cogan [facility]  [phone]--[facility] HealthcarePending - Request [address] [place] [phone]--  Phares Brasher, MSW, [name]-A  10:04 AM 01/13/2024

## 2024-01-13 NOTE — ED Provider Notes (Signed)
 Emergency Medicine Observation Re-evaluation Note  William Romero is a 43 y.o. male, seen on rounds today.  Pt initially presented to the ED for complaints of Hallucinations Currently, the patient is resting  Physical Exam  BP (!) 137/95 (BP Location: Left Arm)   Pulse 95   Temp 97.7 F (36.5 C) (Oral)   Resp 18   Ht 6\' 2"  (1.88 m)   Wt 93.9 kg   SpO2 100%   BMI 26.58 kg/m  Physical Exam General: NAD Cardiac: RR Lungs: non labored Psych: Calm  ED Course / MDM  EKG:   I have reviewed the labs performed to date as well as medications administered while in observation.  Recent changes in the last 24 hours include evaluated by psych.  Plan  Current plan is for inpatient psych.     Rolinda Climes, DO 01/13/24 [phone]

## 2024-01-13 NOTE — Consult Note (Signed)
 [facility] Psychiatric Consult Initial  Patient Name: .William Romero  MRN: [id number]  DOB: 09-20-1980  Consult Order details:  Orders (From admission, onward)     Start     Ordered   01/11/24 0412  CONSULT TO CALL ACT TEAM       Ordering Provider: Sherel Dikes, PA-C  Provider:  (Not yet assigned)  Question:  Reason for Consult?  Answer:  Psych consult   01/11/24 0412             Mode of Visit: Tele-visit Virtual Statement:TELE PSYCHIATRY ATTESTATION & CONSENT As the provider for this telehealth consult, I attest that I verified the patient's identity using two separate identifiers, introduced myself to the patient, provided my credentials, disclosed my location, and performed this encounter via a HIPAA-compliant, real-time, face-to-face, two-way, interactive audio and video platform and with the full consent and agreement of the patient (or guardian as applicable.) Patient physical location: [facility]. Telehealth provider physical location: home office in state of [place] .   Video start time: 08:15 Video end time: 08:35    Psychiatry Consult Evaluation  Service Date: Jan 13, 2024 LOS:  LOS: 0 days  Chief Complaint "I'm hearing voices in my head"  Primary Psychiatric Diagnoses  Schizophrenia  Assessment  William Romero is a 43 y.o. male admitted: Presented to the EDfor 01/11/2024  2:51 AM presents to the emergency department with the complaint of auditory hallucinations. He carries the psychiatric diagnoses of ADHD, schizophrenia, schizoaffective disorder bipolar type, depression, GAD and substance abuse and has a past medical history of HBP.   His current presentation of auditory hallucinations is most consistent with decompensated schizophrenia. He meets criteria for inpatient psychiatric hospitalization based on acute psychosis.  Current outpatient psychotropic medications include none and historically he has had a poor response to these  medications. He was not compliant with medications prior to admission as evidenced by patient report he has not taken them at discharge. On initial examination, patient is cooperative with assessment and appears anxious. Please see plan below for detailed recommendations.   Diagnoses:  Active Hospital problems: Principal Problem:   Schizophrenia (HCC)    Plan   ## Psychiatric Medication Recommendations:  --Olanzapine  15mg  PO Q HS --Depakote  ER 500mg  PO Q Day --Mirtazipine 15mg  PO Q HS    ## Medical Decision Making Capacity: Not specifically addressed in this encounter  ## Further Work-up:  -- most recent EKG on 10/10/23 had QtC of 474 -- Pertinent labwork reviewed earlier this admission includes: CBC, CMP, alcohol and UDS   ## Disposition:-- We recommend inpatient psychiatric hospitalization after medical hospitalization. Patient has been involuntarily committed on 12/12/2023.   ## Behavioral / Environmental: -Utilize compassion and acknowledge the patient's experiences while setting clear and realistic expectations for care.    ## Safety and Observation Level:  - Based on my clinical evaluation, I estimate the patient to be at low risk of self harm in the current setting. - At this time, we recommend  routine. This decision is based on my review of the chart including patient's history and current presentation, interview of the patient, mental status examination, and consideration of suicide risk including evaluating suicidal ideation, plan, intent, suicidal or self-harm behaviors, risk factors, and protective factors. This judgment is based on our ability to directly address suicide risk, implement suicide prevention strategies, and develop a safety plan while the patient is in the clinical setting. Please contact our team if there is a concern  that risk level has changed.  CSSR Risk Category:C-SSRS RISK CATEGORY: No Risk  Suicide Risk Assessment: Patient has following modifiable risk  factors for suicide: medication noncompliance, which we are addressing by recommending inpatient psychiatric hospitalization. Patient has following non-modifiable or demographic risk factors for suicide: male gender and psychiatric hospitalization Patient has the following protective factors against suicide: Access to outpatient mental health care  Thank you for this consult request. Recommendations have been communicated to the primary team.  We will continue to follow at this time.   Jeraline Moment, NP       History of Present Illness  Relevant Aspects of Hospital ED Course:  Admitted on 01/11/2024 presents to the emergency department with the complaint of auditory hallucinations. He carries the psychiatric diagnoses of ADHD, schizophrenia, schizoaffective disorder bipolar type, depression, GAD and substance abuse and has a past medical history of HBP.   Patient Report:  [facility] Romero, is seen face to face by this provider, consulted with Dr. Deborah Falling; and chart reviewed on 01/13/24.  On evaluation [facility] reports "I'm hearing voices in my head."  Patient says the voices are gathering information all around him and that they say "bad words" which patient states cause him to be sick.  Patient says he has stopped his medications each time he has been discharged because they don't stop the voices.  Patient states "I haven't had good treatment."    He reports that he takes adderall 20mg  Q day but that he does not have a current prescription.  In spite of a UDS positive for amphetamines; patient denies any current use of amphetamines.  Patient reports a willingness to go inpatient for treatment, but that "those places make me anxious and it takes 2 weeks to adjust when I leave."  Patient asks for "heavy duty sedatives" for his anxiety.     During evaluation [facility] is seated on his bed in no acute distress.  He is alert & oriented x 4, calm, cooperative and attentive for this  assessment.  His mood is anxious with congruent affect.  He has normal speech, and behavior.  Objectively there is evidence of psychosis/mania and delusional thinking. Pt does not appear to be responding to internal or external stimuli.  Patient is able to converse coherently, goal directed thoughts, no distractibility, or pre-occupation.  He denies suicidal/self-harm/homicidal ideation and endorses psychosis and paranoia.  Patient answered questions appropriately.    Psych ROS:  Depression: endorses Anxiety:  endorses Mania (lifetime and current): endorses Psychosis: (lifetime and current): endorses   Review of Systems  Psychiatric/Behavioral:  Positive for hallucinations and substance abuse.   All other systems reviewed and are negative.    Psychiatric and Social History  Psychiatric History:  Information collected from patient and chart review  Prev Dx/Sx: ADHD, schizophrenia, schizoaffective disorder bipolar type, depression, GAD and substance abuse a Current Psych Provider: denies Home Meds (current): none Previous Med Trials: adderall, depakote , prolixin , trazodone , bupropion, duloxetine , haloperidol , hydroxyzine  (ineffective), lorazepam , olanzapine , ziprasidone , paliperidone , risperidone   Therapy: denies  Prior Psych Hospitalization: yes  Prior Self Harm: denies Prior Violence: denies  Family Psych History: denies Family Hx suicide: denies  Social History:  Developmental Hx: WNL Educational Hx: Associates Degree - Administrator Occupational Hx: Unemployed 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 rate 18, height 6\' 2"  (1.88 m), weight 93.9 kg, SpO2 97%. Body mass index is 26.58 kg/m.  Physical Exam Vitals and nursing note reviewed.  Eyes:     Pupils: Pupils are equal, round, and reactive to light.  Pulmonary:     Effort: Pulmonary effort is normal.  Skin:    General: Skin is dry.  Neurological:     Mental Status: He is alert and oriented to person, place, and time.  Psychiatric:        Attention and Perception: He perceives auditory hallucinations.        Mood and Affect: Mood is anxious.        Speech: Speech normal.        Behavior: Behavior is cooperative.        Thought Content: Thought content is delusional.        Judgment: Judgment is impulsive.     Mental Status Exam: General Appearance: Disheveled  Orientation:  Full (Time, Place, and Person)  Memory:  Immediate;   Fair Recent;   Fair Remote;   Fair  Concentration:  Concentration: Fair  Recall:  Fair  Attention  Fair  Eye Contact:  Good  Speech:  Clear and Coherent  Language:  Fair  Volume:  Normal  Mood: anxious  Affect:  Congruent  Thought Process:  Goal Directed  Thought Content:  Hallucinations: Auditory  Suicidal Thoughts:  No  Homicidal Thoughts:  No  Judgement:  Impaired  Insight:  Lacking  Psychomotor Activity:  Restlessness  Akathisia:  No  Fund of Knowledge:  Fair      Assets:  Communication Skills Desire for Improvement Leisure Time  Cognition:  WNL  ADL's:  Intact  AIMS (if indicated):        Other History   These have been pulled in through the EMR, reviewed, and updated if appropriate.  Family History:  The patient's family history is not on file.  Medical History: Past Medical History:  Diagnosis Date   Paranoid schizophrenia (HCC) 10/03/2023    Schizophrenia (HCC)     Surgical History: Past Surgical History:  Procedure Laterality Date   APPENDECTOMY       Medications:   Current Facility-Administered Medications:    diphenhydrAMINE  (BENADRYL ) capsule 25 mg, 25 mg, Oral, Q8H PRN **OR** diphenhydrAMINE  (BENADRYL ) injection 25 mg, 25 mg, Intramuscular, Q6H PRN, Onuoha, Josephine C, NP   haloperidol  (HALDOL ) tablet 5 mg, 5 mg, Oral, Q8H PRN **OR** haloperidol  lactate (HALDOL ) injection 5 mg, 5 mg, Intramuscular, Q8H PRN, Onuoha, Josephine C, NP   LORazepam  (ATIVAN ) tablet 1 mg, 1 mg, Oral, Q8H PRN, Onuoha, Josephine C, NP, 1 mg at 01/13/24 0516   OLANZapine  zydis (ZYPREXA ) disintegrating tablet 10 mg, 10 mg, Oral, QHS, Onuoha, Josephine C, NP, 10 mg at 01/12/24 2140  Current Outpatient Medications:    amphetamine -dextroamphetamine  (ADDERALL) 20 MG tablet, Take 20 mg by mouth 2 (two) times daily., Disp: , Rfl:    amLODipine  (NORVASC ) 5 MG tablet, Take 1 tablet (5 mg total) by mouth daily. (Patient not taking: Reported on 01/12/2024), Disp: 30 tablet, Rfl: 0  benztropine  (COGENTIN ) 0.5 MG tablet, Take 1 tablet (0.5 mg total) by mouth daily. (Patient not taking: Reported on 01/12/2024), Disp: 30 tablet, Rfl: 1   divalproex  (DEPAKOTE  ER) 500 MG 24 hr tablet, Take 2 tablets (1,000 mg total) by mouth at bedtime. (Patient not taking: Reported on 01/12/2024), Disp: 60 tablet, Rfl: 1   fluPHENAZine  (PROLIXIN ) 10 MG tablet, Take 1 tablet (10 mg total) by mouth 2 (two) times daily. (Patient not taking: Reported on 01/12/2024), Disp: 60 tablet, Rfl: 1   hydrOXYzine  (ATARAX ) 50 MG tablet, Take 1 tablet (50 mg total) by mouth 3 (three) times daily as needed for anxiety. (Patient not taking: Reported on 01/12/2024), Disp: 75 tablet, Rfl: 1   nicotine  polacrilex (NICORETTE ) 2 MG gum, Take 1 each (2 mg total) by mouth as needed for smoking cessation. (Patient not taking: Reported on 01/12/2024), Disp: 100 tablet, Rfl: 0   propranolol  (INDERAL ) 10 MG tablet,  Take 1 tablet (10 mg total) by mouth 3 (three) times daily. (Patient not taking: Reported on 01/12/2024), Disp: 90 tablet, Rfl: 1   traZODone  (DESYREL ) 100 MG tablet, Take 1 tablet (100 mg total) by mouth at bedtime. (Patient not taking: Reported on 01/12/2024), Disp: 30 tablet, Rfl: 1  Allergies: No Known Allergies  Abrie Egloff A Tresia Revolorio, NP

## 2024-01-13 NOTE — BH Assessment (Addendum)
 Per [employer], pt is sleeping. Clinician asked Rose, Paramedic if she could let her know when the pt is alert and able to engage then she will complete his assessment.    Rosi Converse, MS, [facility], [facility] Triage Specialist [phone]

## 2024-01-13 NOTE — BH Assessment (Signed)
 Clinician messaged Reeta Canon, Paramedic: "Hey. It's Trey with TTS. Is the pt able to engage in the assessment, if so is the pt medically cleared?   Clinician awaiting response.   Rosi Converse, MS, [facility] Triage Specialist [phone]

## 2024-01-13 NOTE — BH Assessment (Signed)
 Clinician checked in with [facility], Paramedic pt is still sleeping. Clinician asked, Paramedic to keep TTS informed when the pt is alert and able to engage.    Rosi Converse, MS, [facility], [facility] Triage Specialist [phone]

## 2024-01-13 NOTE — Progress Notes (Signed)
 Patient A/O x 2, ambulatory without assistance. He is experiencing increased psychosis/confusion, mild agitation and states that the he hears voices constantly telling him bad words. PO agitation set given to patient. No adverse reactions noted or reported. Will continue to monitor patient.

## 2024-01-13 NOTE — ED Notes (Signed)
 Pt ambulated to RN desk stating he was feeling anxious and requested PRN meds for such. Attempted to give PO haldol  initially but pt refused and stated he wanted the ativan  instead. PRNs given as noted.

## 2024-01-14 ENCOUNTER — Other Ambulatory Visit: Payer: Self-pay

## 2024-01-14 ENCOUNTER — Encounter (HOSPITAL_COMMUNITY): Payer: Self-pay | Admitting: Nurse Practitioner

## 2024-01-14 DIAGNOSIS — F1520 Other stimulant dependence, uncomplicated: Secondary | ICD-10-CM | POA: Insufficient documentation

## 2024-01-14 DIAGNOSIS — F250 Schizoaffective disorder, bipolar type: Principal | ICD-10-CM

## 2024-01-14 DIAGNOSIS — F12259 Cannabis dependence with psychotic disorder, unspecified: Secondary | ICD-10-CM | POA: Insufficient documentation

## 2024-01-14 MED ORDER — RISPERIDONE 1 MG PO TABS
1.0000 mg | ORAL_TABLET | Freq: Two times a day (BID) | ORAL | Status: DC
  Administered 2024-01-14 – 2024-01-15 (×2): 1 mg via ORAL
  Filled 2024-01-14 (×5): qty 1

## 2024-01-14 MED ORDER — DIVALPROEX SODIUM ER 500 MG PO TB24
500.0000 mg | ORAL_TABLET | Freq: Every day | ORAL | Status: DC
  Administered 2024-01-14 – 2024-01-18 (×5): 500 mg via ORAL
  Filled 2024-01-14 (×6): qty 1

## 2024-01-14 MED ORDER — BENZTROPINE MESYLATE 1 MG PO TABS
1.0000 mg | ORAL_TABLET | Freq: Two times a day (BID) | ORAL | Status: DC
  Administered 2024-01-14 – 2024-01-23 (×18): 1 mg via ORAL
  Filled 2024-01-14 (×21): qty 1

## 2024-01-14 MED ORDER — HYDROXYZINE HCL 25 MG PO TABS
25.0000 mg | ORAL_TABLET | Freq: Three times a day (TID) | ORAL | Status: DC | PRN
  Administered 2024-01-14 – 2024-01-23 (×10): 25 mg via ORAL
  Filled 2024-01-14 (×12): qty 1

## 2024-01-14 MED ORDER — OLANZAPINE 10 MG PO TBDP
10.0000 mg | ORAL_TABLET | Freq: Every day | ORAL | Status: DC
  Administered 2024-01-14: 10 mg via ORAL
  Filled 2024-01-14 (×3): qty 1

## 2024-01-14 NOTE — H&P (Addendum)
 Psychiatric Admission Assessment Adult  Patient Identification: William Romero MRN:  [id number] Date of Evaluation:  01/14/2024 Chief Complaint:  "my grandmother is a rapist inserting these voices" Principal Diagnosis: Schizophrenia, unspecified (HCC) Diagnosis:  Principal Problem:   Schizophrenia, unspecified (HCC)   History of Present Illness:  43 y.o. male with a past psychiatric history of schizophrenia, reported ADHD, GAD, and episodes of depression who prsented to ed yesterday with acute worsening psychosis. UDS + amphetamines and THC.   Per psychiatric evaluation in ED yesterday: " Patient says the voices are gathering information all around him and that they say "bad words" which patient states cause him to be sick.  Patient says he has stopped his medications each time he has been discharged because they don't stop the voices.  Patient states "I haven't had good treatment."  He reports that he takes adderall 20mg  Q day but that he does not have a current prescription.  In spite of a UDS positive for amphetamines; patient denies any current use of amphetamines.  Patient reports a willingness to go inpatient for treatment, but that "those places make me anxious and it takes 2 weeks to adjust when I leave."  Patient asks for "heavy duty sedatives" for his anxiety.   During evaluation William Romero is seated on his bed in no acute distress.  He is alert & oriented x 4, calm, cooperative and attentive for this assessment.  His mood is anxious with congruent affect.  He has normal speech, and behavior.  Objectively there is evidence of psychosis/mania and delusional thinking. Pt does not appear to be responding to internal or external stimuli.  Patient is able to converse coherently, goal directed thoughts, no distractibility, or pre-occupation.  He denies suicidal/self-harm/homicidal ideation and endorses psychosis and paranoia.  Patient answered questions appropriately.  "  Patient  seen at bedside and is irritable and floridly psychotic and with paranoid persecutory delusions about his family. States his "grandmother is capable of lust and rape." States he is hearing voices including his grandmothers voice and they are "saying bad things." States his family have been inserting these voices into his head for years and "it's voodoo doc you know of voodoo?" Patient asking for a "sedative" agreeable to Depakote  and Risperdal . Patient poor historian and states not medications have helped him in the past and  that it's "voodoo you need to understand voodoo." Patient irritable and states he does not want to talk anymore. Patient will not elaborate on how much Adderall he has been using stating he is not using it. Patient denies SI or HI today but notes he feels hopeless "because the voices won't go away." Denies anhedonia. Reports poor concentration and high anxiety stating he is afraid to go to sleep at night because of the voices. Patient overall is poor historian at this time and refuses to answer when asked about manic episodes.   Patient gave me permission to speak to his mother for collateral William Romero) and I called and left a voicemail as she    Associated Signs/Symptoms: Depression Symptoms:  patient denies Duration of Depression Symptoms: Greater than two weeks  (Hypo) Manic Symptoms:  patient denies Anxiety Symptoms:  Excessive Worry, Psychotic Symptoms:  Delusions, Hallucinations: Auditory Ideas of Reference, Paranoia, PTSD Symptoms: Negative Total Time spent with patient: 45 minutes  Past Psychiatric Hx: Current psychiatrist: Previously seen by Dr. Augusta Romero 04/13/2023, poor followup Current therapist: none. Previous reported psychiatric diagnoses:  ?ADHD, schizoaffective disorder bipolar type, GAD Current  psychiatric medications: none. Psychiatric medication history/compliance: Per chart review, poor with the exception of Adderall.  Full med trials include:  adderall, bupropion, duloxetine , haloperidol , hydroxyzine  (ineffective), lorazepam , olanzapine , ziprasidone , paliperidone , risperidone ,  Psychiatric hospitalization(s): Approximately 6 stays over the past from 2012-2019.  Most recently, patient had 3 separate stays at [facility] in 2024, 6/11 - 6/19 and 6/22 - 7/1, and on 10/2023 Psychotherapy history: None Neuromodulation history: none. History of suicide (obtained from HPI): Reported 1 attempted OD in distant past, presently denied History of homicide or aggression (obtained in HPI): none.  Substance Abuse Hx: Alcohol: Intermittent and reports drinking socailly, BAL was negativePatient has had alcohol use disorder in the past per chart documenation Tobacco: Former Cannabis: Daily use, UDS positive for THC.  Patient says that he takes marijuana for sleep  IV drug use: Denied Prescription drug use: Adderall, last prescription filled 1/17. Other illicit drugs: Patient urine positive for amphetamines, has been prescribed Adderall 20 mg twice daily.  Of note, patient Adderall use likely contributed to previous presentations for paranoia/psychosis in the past -- per outpatient progress note with Dr. Leia Romero, it appears that patient was very insistent on being put back on Adderall. Rehab history: Has previously lived at [facility]  Past Medical History: Medical Diagnoses:HTN  Family History: Psych:Denies family psychiatric history     Social History:   Is the patient at risk to self? Yes.    Has the patient been a risk to self in the past 6 months? Yes.    Has the patient been a risk to self within the distant past? Yes.    Is the patient a risk to others? No.  Has the patient been a risk to others in the past 6 months? No.  Has the patient been a risk to others within the distant past? No.    Alcohol Screening:  1. How often do you have a drink containing alcohol?: Never 2. How many drinks containing alcohol do you have on a typical day when  you are drinking?: 1 or 2 3. How often do you have six or more drinks on one occasion?: Never AUDIT-C Score: 0 4. How often during the last year have you found that you were not able to stop drinking once you had started?: Never 5. How often during the last year have you failed to do what was normally expected from you because of drinking?: Never 6. How often during the last year have you needed a first drink in the morning to get yourself going after a heavy drinking session?: Never 7. How often during the last year have you had a feeling of guilt of remorse after drinking?: Never 8. How often during the last year have you been unable to remember what happened the night before because you had been drinking?: Never 9. Have you or someone else been injured as a result of your drinking?: No 10. Has a relative or friend or a doctor or another health worker been concerned about your drinking or suggested you cut down?: No Alcohol Use Disorder Identification Test Final Score (AUDIT): 0 Substance Abuse History in the last 12 months:  No. Consequences of Substance Abuse: Negative Previous Psychotropic Medications: Yes  Psychological Evaluations: Yes  Past Medical History:  Past Medical History:  Diagnosis Date   Paranoid schizophrenia (HCC) 10/03/2023   Schizophrenia (HCC)     Past Surgical History:  Procedure Laterality Date   APPENDECTOMY     Family History: History reviewed. No pertinent family history.  Family Psychiatric  History: see HPI Tobacco Screening:   Social History:  Social History   Substance and Sexual Activity  Alcohol Use Not Currently   Alcohol/week: 5.0 standard drinks of alcohol   Types: 5 Cans of beer per week     Social History   Substance and Sexual Activity  Drug Use No    Additional Social History:                           Allergies:  No Known Allergies Lab Results:  No results found for this or any previous visit (from the past 48  hours).  Blood Alcohol level:  Lab Results  Component Value Date   [facility] <15 01/11/2024   ETH <10 10/03/2023    Metabolic Disorder Labs:  Lab Results  Component Value Date   HGBA1C 5.1 10/03/2023   MPG 99.67 10/03/2023   MPG 96.8 02/13/2023   No results found for: "PROLACTIN" Lab Results  Component Value Date   CHOL 157 10/03/2023   TRIG 76 10/03/2023   HDL 44 10/03/2023   CHOLHDL 3.6 10/03/2023   VLDL 15 10/03/2023   LDLCALC 98 10/03/2023   LDLCALC 77 02/13/2023    Current Medications: Current Facility-Administered Medications  Medication Dose Route Frequency Provider Last Rate Last Admin   acetaminophen  (TYLENOL ) tablet 650 mg  650 mg Oral Q6H PRN Onuoha, Josephine C, NP       alum & mag hydroxide-simeth (MAALOX/MYLANTA) 200-200-20 MG/5ML suspension 30 mL  30 mL Oral Q4H PRN Michel Agreste, Josephine C, NP       haloperidol  (HALDOL ) tablet 5 mg  5 mg Oral TID PRN Onuoha, Josephine C, NP   5 mg at 01/14/24 1610   And   diphenhydrAMINE  (BENADRYL ) capsule 50 mg  50 mg Oral TID PRN Onuoha, Josephine C, NP   50 mg at 01/14/24 0631   haloperidol  lactate (HALDOL ) injection 5 mg  5 mg Intramuscular TID PRN Onuoha, Josephine C, NP       And   diphenhydrAMINE  (BENADRYL ) injection 50 mg  50 mg Intramuscular TID PRN Onuoha, Josephine C, NP       And   LORazepam  (ATIVAN ) injection 2 mg  2 mg Intramuscular TID PRN Onuoha, Josephine C, NP       haloperidol  lactate (HALDOL ) injection 10 mg  10 mg Intramuscular TID PRN Onuoha, Josephine C, NP       And   diphenhydrAMINE  (BENADRYL ) injection 50 mg  50 mg Intramuscular TID PRN Onuoha, Josephine C, NP       And   LORazepam  (ATIVAN ) injection 2 mg  2 mg Intramuscular TID PRN Onuoha, Josephine C, NP       divalproex  (DEPAKOTE  ER) 24 hr tablet 500 mg  500 mg Oral Daily Onuoha, Josephine C, NP   500 mg at 01/14/24 0941   hydrOXYzine  (ATARAX ) tablet 25 mg  25 mg Oral TID PRN Bobbitt, Shalon E, NP   25 mg at 01/14/24 1314   LORazepam  (ATIVAN ) tablet 1  mg  1 mg Oral Q8H PRN Onuoha, Josephine C, NP   1 mg at 01/14/24 0942   mirtazapine (REMERON SOL-TAB) disintegrating tablet 15 mg  15 mg Oral QHS Onuoha, Josephine C, NP       OLANZapine  zydis (ZYPREXA ) disintegrating tablet 15 mg  15 mg Oral QHS Onuoha, Josephine C, NP       PTA Medications: Medications Prior to Admission  Medication Sig Dispense Refill Last  Dose/Taking   amLODipine  (NORVASC ) 5 MG tablet Take 1 tablet (5 mg total) by mouth daily. (Patient not taking: Reported on 01/12/2024) 30 tablet 0    amphetamine -dextroamphetamine  (ADDERALL) 20 MG tablet Take 20 mg by mouth 2 (two) times daily.      benztropine  (COGENTIN ) 0.5 MG tablet Take 1 tablet (0.5 mg total) by mouth daily. (Patient not taking: Reported on 01/12/2024) 30 tablet 1    divalproex  (DEPAKOTE  ER) 500 MG 24 hr tablet Take 2 tablets (1,000 mg total) by mouth at bedtime. (Patient not taking: Reported on 01/12/2024) 60 tablet 1    fluPHENAZine  (PROLIXIN ) 10 MG tablet Take 1 tablet (10 mg total) by mouth 2 (two) times daily. (Patient not taking: Reported on 01/12/2024) 60 tablet 1    hydrOXYzine  (ATARAX ) 50 MG tablet Take 1 tablet (50 mg total) by mouth 3 (three) times daily as needed for anxiety. (Patient not taking: Reported on 01/12/2024) 75 tablet 1    nicotine  polacrilex (NICORETTE ) 2 MG gum Take 1 each (2 mg total) by mouth as needed for smoking cessation. (Patient not taking: Reported on 01/12/2024) 100 tablet 0    propranolol  (INDERAL ) 10 MG tablet Take 1 tablet (10 mg total) by mouth 3 (three) times daily. (Patient not taking: Reported on 01/12/2024) 90 tablet 1    traZODone  (DESYREL ) 100 MG tablet Take 1 tablet (100 mg total) by mouth at bedtime. (Patient not taking: Reported on 01/12/2024) 30 tablet 1     Musculoskeletal: Strength & Muscle Tone: within normal limits Gait & Station: normal Patient leans: N/A    Psychiatric Specialty Exam:  Presentation  General Appearance: Bizarre; Disheveled   Eye  Contact:Poor   Speech:Blocked; Garbled   Speech Volume:Increased   Handedness:Right   Mood and Affect  Mood:Dysphoric; Irritable; Labile; Anxious   Affect:Congruent    Thought Process  Thought Processes:Disorganized   Duration of Psychotic Symptoms: Greater than six months  Past Diagnosis of Schizophrenia or Psychoactive disorder: Yes  Descriptions of Associations:Circumstantial   Orientation:Full (Time, Place and Person)   Thought Content:Delusions; Illogical; Paranoid Ideation; Perseveration   Hallucinations:Hallucinations: Auditory   Ideas of Reference:Paranoia; Percusatory; Delusions   Suicidal Thoughts:Suicidal Thoughts: No   Homicidal Thoughts:Homicidal Thoughts: No    Sensorium  Memory:Immediate Fair   Judgment:Poor   Insight:Fair    Executive Functions  Concentration:Poor   Attention Span:Poor   Recall:Fair   Fund of Knowledge:Fair   Language:Fair    Psychomotor Activity  Psychomotor Activity:Psychomotor Activity: Normal    Assets  Assets:Desire for Improvement; Physical Health    Sleep  Sleep:Sleep: Poor     Physical Exam: Physical exam: Please see exam on admit note. General: Well developed, well nourished.  Pupils: Normal at 3mm Respiratory: Breathing is unlabored.  Cardiovascular: No edema.  Language: No anomia, no aphasia Muscle strength and tone-pt moving all extremities.  Gait not assessed as pt remained in bed.  Neuro: Facial muscles are symmetric. Pt without tremor, no evidence of hyperarousal.  Review of Systems  Constitutional: Negative.   HENT: Negative.    Eyes: Negative.   Respiratory: Negative.    Cardiovascular: Negative.   Gastrointestinal: Negative.   Genitourinary: Negative.   Musculoskeletal: Negative.   Skin: Negative.   Neurological: Negative.   Endo/Heme/Allergies: Negative.   Psychiatric/Behavioral:  Positive for hallucinations. The patient is nervous/anxious and has  insomnia.    Blood pressure (!) 119/94, pulse 98, temperature 97.7 F (36.5 C), temperature source Oral, resp. rate 18, height 6\' 2"  (1.88 m), weight 93.9  kg, SpO2 98%. Body mass index is 26.58 kg/m.   ASSESSMENT: Principal Problem:   Schizophrenia, unspecified (HCC)  Cannabis use disorder Stimulant use   Patient floridly psychotic at this time and with paranoid persecutory delusions. Appears to have been noncompliant with medications and we discussed restarting Depakote  as well as an antipsychotic. Patient is asking for a sedating antipsychotic and out of LAI options I discussed  risperdal  will be most sedating to which patient is agreeable. HE has had agitation protocol 3 times within the last 24 hours.  Patient presenting with acute on chronic exacerbation of psychosis. Patient UDS positive for amphetamine  and THC, Rule out substance induced psychosis.  [facility] day 1.   Treatment Plan Summary: Daily contact with patient to assess and evaluate symptoms and progress in treatment, Medication management, and Plan    Observation Level/Precautions:  15 minute checks  Laboratory:  CBC Chemistry Profile Folic Acid HbAIC HCG UDS UA Vitamin B-12  Psychotherapy:  denies  Medications:  olanzapine , Cogentin   Consultations:    Discharge Concerns:  stabilization of psychosis and LAI  Estimated LOS: 5-7 days  Other:     Safety and Monitoring: INVOLUTARILY (completed second exam) admission to inpatient psychiatric unit for safety, stabilization and treatment Daily contact with patient to assess and evaluate symptoms and progress in treatment Patient's case to be discussed in multi-disciplinary team meeting Observation Level : q15 minute checks Vital signs: q12 hours Precautions: suicide, elopement, and assault  2. Psychiatric Problems #schizoaffective disorder bipolar type -increase Depakote  to 500 mg BID -restart cogentin  1 mg BID -reduce Zyprexa  to 10 mg qhs as it is not available  as a long acting and patient is noncompliant -start Risperdal  1 mg BID -start hydroxyzine  50 mg q6h-PRN for anxiety -restart trazodone  100 mg at bedtime-PRN for insomnia  -STOP Adderal 20 mg BID   3. Medical Management Covid negative CMP: wnl,calcium 8.5 CBC: unremarkable EtOH: <10 UDS: + amphetamine , +THC TSH: ordered A1C:  ordered Lipids: ordered   #HTN -restart amlodipine  5 mg qdaily  Physician Treatment Plan for Primary Diagnosis: Schizophrenia, unspecified (HCC) Long Term Goal(s): Improvement in symptoms so as ready for discharge  Short Term Goals: Ability to identify changes in lifestyle to reduce recurrence of condition will improve, Ability to verbalize feelings will improve, Ability to disclose and discuss suicidal ideas, Ability to demonstrate self-control will improve, Ability to identify and develop effective coping behaviors will improve, Ability to maintain clinical measurements within normal limits will improve, Compliance with prescribed medications will improve, and Ability to identify triggers associated with substance abuse/mental health issues will improve  Physician Treatment Plan for Secondary Diagnosis: Principal Problem:   Schizophrenia, unspecified (HCC)    I certify that inpatient services furnished can reasonably be expected to improve the patient's condition.    Aino Heckert, MD 5/11/20253:15 PM

## 2024-01-14 NOTE — [facility] Suicide Risk Assessment (Signed)
 [facility] Admission Suicide Risk Assessment   Nursing information obtained from:  Patient Demographic factors:  Male, Unemployed, Low socioeconomic status, Caucasian Current Mental Status:  NA Loss Factors:  Financial problems / change in socioeconomic status Historical Factors:  Family history of mental illness or substance abuse, Impulsivity Risk Reduction Factors:  NA  Total Time spent with patient: 45 minutes Principal Problem: Schizophrenia, unspecified (HCC) Diagnosis:  Active Problems:   Schizoaffective disorder, bipolar type (HCC)   Stimulant dependence (HCC)   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  World Science writer [facility]). Score between 0-7:  no or low risk or alcohol related problems. Score between 8-15:  moderate risk of alcohol related problems. Score between 16-19:  high risk of alcohol related problems. Score 20 or above:  warrants further diagnostic evaluation for alcohol dependence and treatment.   CLINICAL FACTORS:   Severe Anxiety and/or Agitation Schizophrenia:   Depressive state Paranoid or undifferentiated type Currently Psychotic Unstable or Poor Therapeutic Relationship Previous Psychiatric Diagnoses and Treatments Medical Diagnoses and Treatments/Surgeries   Musculoskeletal: Strength & Muscle Tone: within normal limits Gait & Station: normal Patient leans: N/A  Psychiatric Specialty Exam:  Presentation  General Appearance:  Bizarre; Disheveled  Eye Contact: Poor  Speech: Blocked; Garbled  Speech Volume: Increased  Handedness: Right   Mood and Affect  Mood: Dysphoric; Irritable; Labile; Anxious  Affect: Congruent   Thought Process  Thought  Processes: Disorganized  Descriptions of Associations:Circumstantial  Orientation:Full (Time, Place and Person)  Thought Content:Delusions; Illogical; Paranoid Ideation; Perseveration  History of Schizophrenia/Schizoaffective disorder:Yes  Duration of Psychotic Symptoms:Greater than six months  Hallucinations:Hallucinations: Auditory  Ideas of Reference:Paranoia; Percusatory; Delusions  Suicidal Thoughts:Suicidal Thoughts: No  Homicidal Thoughts:Homicidal Thoughts: No   Sensorium  Memory: Immediate Fair  Judgment: Poor  Insight: Fair   Art therapist  Concentration: Poor  Attention Span: Poor  Recall: Fair  Fund of Knowledge: Fair  Language: Fair   Psychomotor Activity  Psychomotor Activity: Psychomotor Activity: Normal   Assets  Assets: Desire for Improvement; Physical Health   Sleep  Sleep: Sleep: Poor    Physical Exam: Physical Exam ROS Blood pressure (!) 119/94, pulse 98, temperature 97.7 F (36.5 C), temperature source Oral, resp. rate 18, height 6\' 2"  (1.88 m), weight 93.9 kg, SpO2 98%. Body mass index is 26.58 kg/m.   COGNITIVE FEATURES THAT CONTRIBUTE TO RISK:  Closed-mindedness and Polarized thinking    SUICIDE RISK:   Moderate:  Frequent suicidal ideation with limited intensity, and duration, some specificity in terms of plans, no associated intent, good self-control, limited dysphoria/symptomatology, some risk factors present, and identifiable protective factors, including available and accessible social support.  PLAN OF CARE: admit to inpatient psychiatry  I certify that inpatient services furnished can reasonably be expected to improve the patient's condition.   Tabetha Haraway, MD 01/14/2024, 3:35 PM

## 2024-01-14 NOTE — Plan of Care (Signed)
   Problem: Education: Goal: Emotional status will improve Outcome: Not Progressing Goal: Mental status will improve Outcome: Not Progressing Goal: Verbalization of understanding the information provided will improve Outcome: Not Progressing

## 2024-01-14 NOTE — Group Note (Signed)
 Date:  01/14/2024 Time:  8:40 PM  Group Topic/Focus:  Wrap-Up Group:   The focus of this group is to help patients review their daily goal of treatment and discuss progress on daily workbooks.    Participation Level:  Minimal  Participation Quality:  Appropriate  Affect:  Appropriate  Cognitive:  Appropriate  Insight: Appropriate  Engagement in Group:  Improving  Modes of Intervention:  Discussion  Additional Comments:  Pt stated his goal for today was to focus on his treatment plan. Pt stated he accomplished his goal today. Pt stated he talked with his doctor and social worker about his care today. Pt rated his overall day a 8 out of 10. Pt stated he made no calls today. Pt stated he felt better about himself tonight. Pt stated staff brought back all his meals today. Pt stated he took all medications provided today. Pt stated he attend all groups held today. Pt stated his appetite was pretty good today. Pt rated sleep last night was pretty good. Pt stated the goal tonight was to get some rest. Pt stated he had no physical pain tonight. Pt deny visual hallucinations but admitted to dealing with auditory issues tonight.  Pt nurse was updated on the situation.Pt denies thoughts of harming himself or others. Pt stated he would alert staff if anything changed.  William Romero 01/14/2024, 8:40 PM

## 2024-01-14 NOTE — Plan of Care (Signed)
  Problem: Education: Goal: Knowledge of [facility] Education information/materials will improve Outcome: Progressing Goal: Verbalization of understanding the information provided will improve Outcome: Progressing   Problem: Education: Goal: Emotional status will improve Outcome: Not Progressing Goal: Mental status will improve Outcome: Not Progressing   Problem: Activity: Goal: Interest or engagement in activities will improve Outcome: Not Progressing

## 2024-01-14 NOTE — Progress Notes (Signed)
 Patient fidgety, agitated, rocking back and forth, pacing. He states he is "anxious". Provider made aware. In addition, patient states he is in pain- "psychological pain" the voices are louder. Active listening/emotional support provided to patient. PO Agitation set given to patient per providers orders. Will continue to monitor patient.

## 2024-01-14 NOTE — Progress Notes (Signed)
 Patient ID: William Romero, male   DOB: 06-18-1981, 42 y.o.   MRN: [id number]  Patient admitted to [facility] from [facility] ED. He presented with complaints of Auditory Hallucinations x 6 months, "telling him bad words". Patient denies SI/HI/VH/Pain. Admission process/consent forms complete. Non invasive search/skin assessment performed. Patient noted with UE/LE small marks that appeared like bite marks from an insect. He states the marks are from him "scratching". Tattoo to the front chest/abd. Patient offered a meal/snack and received. Patient oriented to unit/room. Patient safety maintained.

## 2024-01-14 NOTE — Progress Notes (Signed)
   01/14/24 2015  Psych Admission Type (Psych Patients Only)  Admission Status Involuntary  Psychosocial Assessment  Patient Complaints Anxiety;Suspiciousness (pt reports voices are getting bad and request medication for auditory hallucinations)  Eye Contact Brief  Facial Expression Anxious  Affect Anxious;Preoccupied  Speech Pressured  Interaction Assertive  Motor Activity Other (Comment) (wnl)  Appearance/Hygiene In scrubs  Behavior Characteristics Anxious  Mood Suspicious;Preoccupied  Thought Process  Coherency Circumstantial  Content Preoccupation  Delusions None reported or observed  Perception Hallucinations  Hallucination Auditory  Judgment Poor  Confusion None  Danger to Self  Current suicidal ideation? Denies

## 2024-01-14 NOTE — Progress Notes (Signed)
 Patient approached this RN complaining of agitation citing "the voices" as the cause. Patient observed to be fidgety and restless, pacing the hallway. PO agitation protocol administered. Patient agreeable. Safety checks continue. Patient remains safe at this time.

## 2024-01-14 NOTE — Tx Team (Signed)
 Initial Treatment Plan 01/14/2024 2:59 AM William Romero IRJ:[id number]    PATIENT STRESSORS: Financial difficulties   Medication change or noncompliance     PATIENT STRENGTHS: Capable of independent living  General fund of knowledge    PATIENT IDENTIFIED PROBLEMS: Medication Non compliance  Substance Abuse                   DISCHARGE CRITERIA:  Ability to meet basic life and health needs Improved stabilization in mood, thinking, and/or behavior Verbal commitment to aftercare and medication compliance  PRELIMINARY DISCHARGE PLAN: Outpatient therapy Return to previous living arrangement  PATIENT/FAMILY INVOLVEMENT: This treatment plan has been presented to and reviewed with the patient, William Romero.  The patient  have been given the opportunity to ask questions and make suggestions.  Juanda Noon, RN 01/14/2024, 2:59 AM

## 2024-01-14 NOTE — [facility] Counselor (Signed)
 1st attempt the nurse informed CSW that patient is not ready and don't bother him at this time.

## 2024-01-15 ENCOUNTER — Encounter (HOSPITAL_COMMUNITY): Payer: Self-pay

## 2024-01-15 DIAGNOSIS — F250 Schizoaffective disorder, bipolar type: Secondary | ICD-10-CM | POA: Diagnosis not present

## 2024-01-15 LAB — HEMOGLOBIN A1C
Hgb A1c MFr Bld: 5.2 % (ref 4.8–5.6)
Mean Plasma Glucose: 103 mg/dL

## 2024-01-15 LAB — LIPID PANEL
Cholesterol: 168 mg/dL (ref 0–200)
HDL: 42 mg/dL (ref >40)
LDL Cholesterol: 92 mg/dL (ref 0–99)
Total CHOL/HDL Ratio: 4 ratio
Triglycerides: 172 mg/dL — ABNORMAL HIGH (ref <150)
VLDL: 34 mg/dL (ref 0–40)

## 2024-01-15 LAB — TSH: TSH: 1.559 u[IU]/mL (ref 0.350–4.500)

## 2024-01-15 MED ORDER — LORAZEPAM 1 MG PO TABS
1.0000 mg | ORAL_TABLET | Freq: Three times a day (TID) | ORAL | Status: AC
  Administered 2024-01-15 – 2024-01-18 (×9): 1 mg via ORAL
  Filled 2024-01-15 (×9): qty 1

## 2024-01-15 MED ORDER — OLANZAPINE 5 MG PO TBDP
5.0000 mg | ORAL_TABLET | Freq: Every day | ORAL | Status: DC
  Administered 2024-01-15 – 2024-01-16 (×2): 5 mg via ORAL
  Filled 2024-01-15 (×3): qty 1

## 2024-01-15 MED ORDER — RISPERIDONE 2 MG PO TABS
2.0000 mg | ORAL_TABLET | Freq: Two times a day (BID) | ORAL | Status: DC
  Administered 2024-01-15 – 2024-01-17 (×4): 2 mg via ORAL
  Filled 2024-01-15 (×6): qty 1

## 2024-01-15 NOTE — [facility] Group Notes (Signed)
 [facility] Group Notes:  (Nursing/MHT/Case Management/Adjunct)  Date:  01/15/2024  Time:  8:59 PM  Type of Therapy:  Psychoeducational Skills  Participation Level:  Did Not Attend  Participation Quality:  Resistant  Affect:  Resistant  Cognitive:  Lacking  Insight:  None  Engagement in Group:  None  Modes of Intervention:  Education  Summary of Progress/Problems: The patient did not attend group this evening.   Naveh Rickles S 01/15/2024, 8:59 PM

## 2024-01-15 NOTE — Progress Notes (Signed)
 Patient approached this RN stating, "The voices. They keep trying to start something with me. I don't understand why they want to start something. I'm 43 years old." Patient restless, fidgety, and expressed distress and agitation. PO agitation protocol administered, per MAR. Safety checks continue. Patient remains safe at this time.

## 2024-01-15 NOTE — [facility] Suicide Risk Assessment (Signed)
 [facility] INPATIENT:  Family/Significant Other Suicide Prevention Education  Suicide Prevention Education:  Patient Refusal for Family/Significant Other Suicide Prevention Education: The patient William Romero has refused to provide written consent for family/significant other to be provided Family/Significant Other Suicide Prevention Education during admission and/or prior to discharge.  Physician notified.  Patient said that he doesn't have any support system.   [name], LCSWA 01/15/2024, 4:27 PM

## 2024-01-15 NOTE — Plan of Care (Signed)
   Problem: Education: Goal: Emotional status will improve Outcome: Progressing Goal: Mental status will improve Outcome: Progressing Goal: Verbalization of understanding the information provided will improve Outcome: Progressing
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
## 2024-01-15 NOTE — Plan of Care (Signed)
   Problem: Safety: Goal: Periods of time without injury will increase Outcome: Progressing

## 2024-01-15 NOTE — Progress Notes (Signed)
   01/15/24 0742  Psych Admission Type (Psych Patients Only)  Admission Status Involuntary  Psychosocial Assessment  Patient Complaints Anxiety;Suspiciousness  Eye Contact Brief  Facial Expression Anxious  Affect Anxious;Irritable;Preoccupied  Speech Pressured;Argumentative;Aggressive  Interaction Assertive  Motor Activity Other (Comment) (WDL)  Appearance/Hygiene In scrubs  Behavior Characteristics Anxious  Mood Suspicious;Preoccupied  Thought Process  Coherency Circumstantial  Content Preoccupation  Delusions None reported or observed  Perception Hallucinations  Hallucination Auditory ("They're saying bad words.")  Judgment Limited  Confusion None  Danger to Self  Current suicidal ideation? Denies  Agreement Not to Harm Self Yes  Description of Agreement Verbal  Danger to Others  Danger to Others None reported or observed

## 2024-01-15 NOTE — Group Note (Signed)
 [name] Group Therapy Note   Group Date: 01/15/2024 Start Time: 1100 End Time: 1200   Participation:  did not attend  Type of Therapy:  Group Therapy  Topic:  Stress Less:  Nurturing Your Mind and Body Through Calm   Objective: Learn techniques for managing stress through body relaxation, mindfulness, and self-compassion.  Goals: Use body relaxation techniques, such as Box Breathing and Progressive Muscle Relaxation, to reduce physical tension. Practice mindfulness to break the cycle of overthinking and mental chatter. Embrace self-compassion to handle stress with kindness and resilience.  Summary: Today's session focused on calming the body with relaxation techniques, breaking the cycle of stress with mindfulness, and using self-compassion to manage challenges more gracefully. These tools help reduce stress and foster a balanced, peaceful mindset.  Therapeutic Modalities used:  Elements of CBT ( cognitive restructuring)  Elements of DBT (box breathing, progressive body relaxation, mindfulness, acceptance)    [place] O Philander Ake, LCSWA 01/15/2024  2:28 PM

## 2024-01-15 NOTE — Progress Notes (Signed)
 [facility] MD Progress Note  01/15/2024 1:25 PM William Romero  MRN:  [id number] Subjective:   Patient states "my grandmother is inserting sexual rapist voices into my head... my whole family has been against me all these years, don't call them." Patient seen with social worker for treatment team and remains highly irritable, paranoid and labile. Patient has been receiving PO medications for agitation protocol per nursing staff and responding to internal stimuli as well as complaining of intrusive AH. Denies to me they are command in nature. Patient is very irritable and paranoid and states he is tired of answering questions. States today that I am not to contact any of his family including his mother despite my explanation it is for collateral regarding his previous medication trials. Patient claims that I have seen him before and he knows me. Poor historian and cannot tell me if he has had prior LAI trials and if they have been effective. Reports AH, denies SI, HI or VH. Reports fair sleep and appetite. Claims he has not been using Adderal and never abused it despite prior documentation with concerns to the contrary and UDS +.  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 Augusta Blizzard 04/13/2023, poor followup Current therapist: none. Previous reported psychiatric diagnoses:  ?ADHD, schizoaffective disorder bipolar type, GAD Current psychiatric medications: none. Psychiatric medication history/compliance: Per chart review, poor with the exception of Adderall.  Full med trials include: adderall, bupropion, duloxetine , haloperidol , hydroxyzine  (ineffective), lorazepam ,  olanzapine , ziprasidone , paliperidone , risperidone ,  Psychiatric hospitalization(s): Approximately 6 stays over the past from 2012-2019.  Most recently, patient had 3 separate stays at [facility] in 2024, 6/11 - 6/19 and 6/22 - 7/1, and on 10/2023 Psychotherapy history: None Neuromodulation history: none. History of suicide (obtained from HPI): Reported 1 attempted OD in distant past, presently denied History of homicide or aggression (obtained in HPI): none.  Past Medical History:  Past Medical History:  Diagnosis Date   Paranoid schizophrenia (HCC) 10/03/2023   Schizophrenia (HCC)     Past Surgical History:  Procedure Laterality Date   APPENDECTOMY     Family History: History reviewed. No pertinent family history. Family Psychiatric  History:  Denies family psychiatric history  Social History:  Social History   Substance and Sexual Activity  Alcohol Use Not Currently   Alcohol/week: 5.0 standard drinks of alcohol   Types: 5 Cans of beer per week     Social History   Substance and Sexual Activity  Drug Use No    Social History   Socioeconomic History   Marital status: Single    Spouse name: Not on file   Number of children: Not on file   Years of education: Not on file   Highest education level: Not on file  Occupational History   Not on file  Tobacco Use   Smoking status: Former    Current packs/day: 0.50    Types: Cigarettes   Smokeless tobacco: Never  Vaping Use   Vaping status: Never Used  Substance and Sexual Activity   Alcohol use: Not Currently    Alcohol/week: 5.0 standard drinks of alcohol    Types: 5 Cans of beer per week   Drug use: No   Sexual activity: Not  Currently  Other Topics Concern   Not on file  Social History Narrative   Not on file   Social Drivers of Health   Financial Resource Strain: Not on file  Food Insecurity: No Food Insecurity (01/14/2024)   Hunger Vital Sign    Worried About Running Out of Food in the Last Year: Never true     Ran Out of Food in the Last Year: Never true  Transportation Needs: No Transportation Needs (01/14/2024)   PRAPARE - Administrator, Civil Service (Medical): No    Lack of Transportation (Non-Medical): No  Physical Activity: Not on file  Stress: Not on file  Social Connections: Not on file   Additional Social History:                         Sleep: Fair  Appetite:  Fair  Current Medications: Current Facility-Administered Medications  Medication Dose Route Frequency Provider Last Rate Last Admin   acetaminophen  (TYLENOL ) tablet 650 mg  650 mg Oral Q6H PRN Onuoha, Josephine C, NP       alum & mag hydroxide-simeth (MAALOX/MYLANTA) 200-200-20 MG/5ML suspension 30 mL  30 mL Oral Q4H PRN Onuoha, Josephine C, NP       benztropine  (COGENTIN ) tablet 1 mg  1 mg Oral Q12H Jarome Trull, MD   1 mg at 01/15/24 9562   haloperidol  (HALDOL ) tablet 5 mg  5 mg Oral TID PRN Onuoha, Josephine C, NP   5 mg at 01/15/24 1308   And   diphenhydrAMINE  (BENADRYL ) capsule 50 mg  50 mg Oral TID PRN Onuoha, Josephine C, NP   50 mg at 01/15/24 6578   haloperidol  lactate (HALDOL ) injection 5 mg  5 mg Intramuscular TID PRN Onuoha, Josephine C, NP       And   diphenhydrAMINE  (BENADRYL ) injection 50 mg  50 mg Intramuscular TID PRN Onuoha, Josephine C, NP       And   LORazepam  (ATIVAN ) injection 2 mg  2 mg Intramuscular TID PRN Onuoha, Josephine C, NP       haloperidol  lactate (HALDOL ) injection 10 mg  10 mg Intramuscular TID PRN Onuoha, Josephine C, NP       And   diphenhydrAMINE  (BENADRYL ) injection 50 mg  50 mg Intramuscular TID PRN Onuoha, Josephine C, NP       And   LORazepam  (ATIVAN ) injection 2 mg  2 mg Intramuscular TID PRN Onuoha, Josephine C, NP       divalproex  (DEPAKOTE  ER) 24 hr tablet 500 mg  500 mg Oral Daily Onuoha, Josephine C, NP   500 mg at 01/15/24 4696   divalproex  (DEPAKOTE  ER) 24 hr tablet 500 mg  500 mg Oral QHS Ancil Dewan, MD   500 mg at 01/14/24 2014   hydrOXYzine   (ATARAX ) tablet 25 mg  25 mg Oral TID PRN Bobbitt, Shalon E, NP   25 mg at 01/15/24 0900   LORazepam  (ATIVAN ) tablet 1 mg  1 mg Oral TID Lynita Groseclose, MD   1 mg at 01/15/24 1240   mirtazapine (REMERON SOL-TAB) disintegrating tablet 15 mg  15 mg Oral QHS Onuoha, Josephine C, NP   15 mg at 01/14/24 2015   OLANZapine  zydis (ZYPREXA ) disintegrating tablet 5 mg  5 mg Oral QHS Dovber Ernest, MD       risperiDONE  (RISPERDAL ) tablet 2 mg  2 mg Oral Q12H Haddon Fyfe, MD        Lab Results:  Results  for orders placed or performed during the hospital encounter of 01/13/24 (from the past 48 hours)  TSH     Status: None   Collection Time: 01/15/24  6:26 AM  Result Value Ref Range   TSH 1.559 0.350 - 4.500 uIU/mL    Comment: Performed by a 3rd Generation assay with a functional sensitivity of <=0.01 uIU/mL. Performed at [facility], [address] [place], [place] [address]   Lipid panel     Status: Abnormal   Collection Time: 01/15/24  6:26 AM  Result Value Ref Range   Cholesterol 168 0 - 200 mg/dL   Triglycerides 604 (H) <150 mg/dL   HDL 42 >54 mg/dL   Total CHOL/HDL Ratio 4.0 RATIO   VLDL 34 0 - 40 mg/dL   LDL Cholesterol 92 0 - 99 mg/dL    Comment:        Total Cholesterol/HDL:CHD Risk Coronary Heart Disease Risk Table                     Men   Women  1/2 Average Risk   3.4   3.3  Average Risk       5.0   4.4  2 X Average Risk   9.6   7.1  3 X Average Risk  23.4   11.0        Use the calculated Patient Ratio above and the CHD Risk Table to determine the patient's CHD Risk.        ATP III CLASSIFICATION (LDL):  <100     mg/dL   Optimal  098-119  mg/dL   Near or Above                    Optimal  130-159  mg/dL   Borderline  147-829  mg/dL   High  >562     mg/dL   Very High Performed at [facility], [address] [place], [place] [address]     Blood Alcohol level:  Lab Results  Component Value Date   [facility] <15 01/11/2024   ETH <10  10/03/2023    Metabolic Disorder Labs: Lab Results  Component Value Date   HGBA1C 5.1 10/03/2023   MPG 99.67 10/03/2023   MPG 96.8 02/13/2023   No results found for: "PROLACTIN" Lab Results  Component Value Date   CHOL 168 01/15/2024   TRIG 172 (H) 01/15/2024   HDL 42 01/15/2024   CHOLHDL 4.0 01/15/2024   VLDL 34 01/15/2024   LDLCALC 92 01/15/2024   LDLCALC 98 10/03/2023    Physical Findings: AIMS:  , ,  ,  ,    CIWA:    COWS:     Musculoskeletal: Strength & Muscle Tone: within normal limits Gait & Station: normal Patient leans: N/A  Psychiatric Specialty Exam:  Presentation  General Appearance:  Bizarre; Disheveled  Eye Contact: Poor  Speech: Blocked  Speech Volume: Increased  Handedness: Right   Mood and Affect  Mood: Anxious; Dysphoric; Irritable; Labile  Affect: Congruent; Labile   Thought Process  Thought Processes: Disorganized  Descriptions of Associations:Tangential  Orientation:Full (Time, Place and Person)  Thought Content:Delusions; Illogical; Paranoid Ideation; Perseveration  History of Schizophrenia/Schizoaffective disorder:Yes  Duration of Psychotic Symptoms:Greater than six months  Hallucinations:Hallucinations: Auditory  Ideas of Reference:Percusatory; Paranoia; Delusions  Suicidal Thoughts:Suicidal Thoughts: No  Homicidal Thoughts:Homicidal Thoughts: No   Sensorium  Memory: Immediate Fair; Remote Poor  Judgment: Poor  Insight: Poor   Art therapist  Concentration: Poor  Attention Span: Poor  Recall: [employer] of Knowledge: Fair  Language: Fair   Psychomotor Activity  Psychomotor Activity: Psychomotor Activity: Normal   Assets  Assets: Communication Skills; Desire for Improvement; Physical Health   Sleep  Sleep: Sleep: Fair    Physical Exam: Physical exam: Please see exam on admit note. General: Well developed, well nourished.  Pupils: Normal at 3mm Respiratory:  Breathing is unlabored.  Cardiovascular: No edema.  Language: No anomia, no aphasia Muscle strength and tone-pt moving all extremities.  Gait not assessed as pt remained in bed.  Neuro: Facial muscles are symmetric. Pt without tremor, no evidence of hyperarousal.  Review of Systems  Constitutional: Negative.   HENT: Negative.    Eyes: Negative.   Respiratory: Negative.    Cardiovascular: Negative.   Gastrointestinal: Negative.   Genitourinary: Negative.   Musculoskeletal: Negative.   Skin: Negative.   Neurological: Negative.   Endo/Heme/Allergies: Negative.   Psychiatric/Behavioral:  Positive for hallucinations and substance abuse. The patient is nervous/anxious.    Blood pressure (!) 120/95, pulse (!) 110, temperature 98 F (36.7 C), temperature source Oral, resp. rate 18, height 6\' 2"  (1.88 m), weight 93.9 kg, SpO2 99%. Body mass index is 26.58 kg/m.   Treatment Plan Summary: Daily contact with patient to assess and evaluate symptoms and progress in treatment, Medication management, and Plan increase Risperdal  to 2 mg BID and start Ativan  1 mg TID x3 days for high anxiety, irritability  Patient remains floridly psychotic at this time and with paranoid persecutory delusions about his whole family inserting voices of sexual commentary into his head, denies they are command in nature and patient denies SI or HI Today. Appears to have been noncompliant with medications and outpatient followup.  Patient is poor historian and with poor insight and judgement. Cannot tell me what antipsychotics he has tried or for what duration or what LAIs he has received. Would benefit from collateral which patient is not refusing due to paranoid persecutory delusions about all of his family. Paranoid about my intentions to get collateral.  Patient is asking for a sedating antipsychotic and out of LAI options I discussed  risperdal  will be most sedating to which patient is agreeable. Continues to require  agitation protocol.  Patient UDS positive for amphetamine  and THC, Rule out substance induced psychosis.   IF patient has failed two antipsychotic trials of 6 weeks at least 1000 mg chlorpromazine equivalents he would benefit from Clozapine trial althought patient is unable to provide me this information stating "I don't know."     Treatment Plan Summary: Daily contact with patient to assess and evaluate symptoms and progress in treatment, Medication management, and Plan     Observation Level/Precautions:  15 minute checks  Laboratory:  CBC Chemistry Profile Folic Acid HbAIC HCG UDS UA Vitamin B-12  Psychotherapy:  denies  Medications:  olanzapine , Cogentin   Consultations:    Discharge Concerns:  stabilization of psychosis and LAI  Estimated LOS: 5-7 days  Other:      Safety and Monitoring: INVOLUTARILY (completed second exam) admission to inpatient psychiatric unit for safety, stabilization and treatment Daily contact with patient to assess and evaluate symptoms and progress in treatment Patient's case to be discussed in multi-disciplinary team meeting Observation Level : q15 minute checks Vital signs: q12 hours Precautions: suicide, elopement, and assault   2. Psychiatric Problems #schizoaffective disorder bipolar type -cont  Depakote  500 mg BID -cont cogentin  1 mg BID -reduce Zyprexa  to 5 mg qhs  as it is not available as a long acting and patient is noncompliant -increase Risperdal  to 2 mg BID -cont hydroxyzine  50 mg q6h-PRN for anxiety -start Ativan  1 mg TID for 3 days for high anxiety, irritability and mood lability -cont trazodone  100 mg at bedtime-PRN for insomnia   -STOPped Adderal 20 mg BID (history of dependence per chart review although patient denies)     3. Medical Management Covid negative CMP: wnl,calcium 8.5 CBC: unremarkable EtOH: <10 UDS: + amphetamine , +THC TSH: ordered A1C:  ordered, pending Lipids: triglycerides elevated otherwise wnl      #HTN -cont amlodipine  5 mg qdaily   Physician Treatment Plan for Primary Diagnosis: Schizophrenia, unspecified (HCC) Long Term Goal(s): Improvement in symptoms so as ready for discharge   Short Term Goals: Ability to identify changes in lifestyle to reduce recurrence of condition will improve, Ability to verbalize feelings will improve, Ability to disclose and discuss suicidal ideas, Ability to demonstrate self-control will improve, Ability to identify and develop effective coping behaviors will improve, Ability to maintain clinical measurements within normal limits will improve, Compliance with prescribed medications will improve, and Ability to identify triggers associated with substance abuse/mental health issues will improve   Physician Treatment Plan for Secondary Diagnosis: Principal Problem:   Schizophrenia, unspecified (HCC)       I certify that inpatient services furnished can reasonably be expected to improve the patient's condition.      Tayia Stonesifer, MD 01/15/2024, 1:25 PM

## 2024-01-15 NOTE — BH IP Treatment Plan (Signed)
 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)   Current Medications:  Current Facility-Administered Medications  Medication Dose Route Frequency Provider Last Rate Last Admin   acetaminophen  (TYLENOL ) tablet 650 mg  650 mg Oral Q6H PRN Onuoha, Josephine C, NP       alum & mag hydroxide-simeth (MAALOX/MYLANTA) 200-200-20 MG/5ML suspension 30 mL  30 mL Oral Q4H PRN Michel Agreste, Josephine C, NP       benztropine  (COGENTIN ) tablet 1 mg  1 mg Oral Q12H Zouev, Dmitri, MD   1 mg at 01/15/24 7829   haloperidol  (HALDOL ) tablet 5 mg  5 mg Oral TID PRN Onuoha, Josephine C, NP   5 mg at 01/15/24 5621   And   diphenhydrAMINE  (BENADRYL ) capsule 50 mg  50 mg Oral TID PRN Onuoha, Josephine C, NP   50 mg at 01/15/24 3086   haloperidol  lactate (HALDOL ) injection 5 mg  5 mg Intramuscular TID PRN Onuoha, Josephine C, NP       And   diphenhydrAMINE  (BENADRYL ) injection 50 mg  50 mg Intramuscular TID PRN Onuoha, Josephine C, NP       And   LORazepam  (ATIVAN ) injection 2 mg  2 mg Intramuscular TID PRN Onuoha, Josephine C, NP       haloperidol  lactate (HALDOL ) injection 10 mg  10 mg Intramuscular TID PRN Onuoha, Josephine C, NP       And   diphenhydrAMINE  (BENADRYL ) injection 50 mg  50 mg Intramuscular TID PRN Onuoha, Josephine C, NP       And   LORazepam  (ATIVAN ) injection 2 mg  2 mg Intramuscular TID PRN Onuoha, Josephine C, NP       divalproex  (DEPAKOTE  ER) 24 hr tablet 500 mg  500 mg Oral Daily Onuoha, Josephine C, NP   500 mg at 01/15/24 5784   divalproex  (DEPAKOTE  ER) 24 hr tablet 500 mg  500 mg Oral QHS Zouev, Dmitri, MD   500 mg at 01/14/24 2014    hydrOXYzine  (ATARAX ) tablet 25 mg  25 mg Oral TID PRN Bobbitt, Shalon E, NP   25 mg at 01/15/24 0900   LORazepam  (ATIVAN ) tablet 1 mg  1 mg Oral TID Zouev, Dmitri, MD   1 mg at 01/15/24 1240   mirtazapine (REMERON SOL-TAB) disintegrating tablet 15 mg  15 mg Oral QHS Onuoha, Josephine C, NP   15 mg at 01/14/24 2015   OLANZapine  zydis (ZYPREXA ) disintegrating tablet 5 mg  5 mg Oral QHS Zouev, Dmitri, MD       risperiDONE  (RISPERDAL ) tablet 2 mg  2 mg Oral Q12H Zouev, Dmitri, MD       PTA Medications: Medications Prior to Admission  Medication Sig Dispense Refill Last Dose/Taking   amLODipine  (NORVASC ) 5 MG tablet Take 1 tablet (5 mg total) by mouth daily. (Patient not taking: Reported on 01/12/2024) 30 tablet 0    amphetamine -dextroamphetamine  (ADDERALL) 20 MG tablet Take 20 mg by mouth 2 (two) times daily.      benztropine  (COGENTIN ) 0.5 MG tablet Take 1 tablet (0.5 mg total) by mouth daily. (Patient not taking: Reported on 01/12/2024) 30 tablet 1    divalproex  (DEPAKOTE  ER) 500 MG 24 hr tablet Take 2 tablets (1,000 mg total) by  mouth at bedtime. (Patient not taking: Reported on 01/12/2024) 60 tablet 1    fluPHENAZine  (PROLIXIN ) 10 MG tablet Take 1 tablet (10 mg total) by mouth 2 (two) times daily. (Patient not taking: Reported on 01/12/2024) 60 tablet 1    hydrOXYzine  (ATARAX ) 50 MG tablet Take 1 tablet (50 mg total) by mouth 3 (three) times daily as needed for anxiety. (Patient not taking: Reported on 01/12/2024) 75 tablet 1    nicotine  polacrilex (NICORETTE ) 2 MG gum Take 1 each (2 mg total) by mouth as needed for smoking cessation. (Patient not taking: Reported on 01/12/2024) 100 tablet 0    propranolol  (INDERAL ) 10 MG tablet Take 1 tablet (10 mg total) by mouth 3 (three) times daily. (Patient not taking: Reported on 01/12/2024) 90 tablet 1    traZODone  (DESYREL ) 100 MG tablet Take 1 tablet (100 mg total) by mouth at bedtime. (Patient not taking: Reported on 01/12/2024) 30 tablet 1     Patient Stressors:  Financial difficulties   Medication change or noncompliance    Patient Strengths: Capable of independent living  General fund of knowledge   Treatment Modalities: Medication Management, Group therapy, Case management,  1 to 1 session with clinician, Psychoeducation, Recreational therapy.   Physician Treatment Plan for Primary Diagnosis: Schizoaffective disorder, bipolar type (HCC) Long Term Goal(s): Improvement in symptoms so as ready for discharge   Short Term Goals: Ability to identify changes in lifestyle to reduce recurrence of condition will improve Ability to verbalize feelings will improve Ability to disclose and discuss suicidal ideas Ability to demonstrate self-control will improve Ability to identify and develop effective coping behaviors will improve Ability to maintain clinical measurements within normal limits will improve Compliance with prescribed medications will improve Ability to identify triggers associated with substance abuse/mental health issues will improve  Medication Management: Evaluate patient's response, side effects, and tolerance of medication regimen.  Therapeutic Interventions: 1 to 1 sessions, Unit Group sessions and Medication administration.  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)  Long Term Goal(s): Improvement in symptoms so as ready for discharge   Short Term Goals: Ability to identify changes in lifestyle to reduce recurrence of condition will improve Ability to verbalize feelings will improve Ability to disclose and discuss suicidal ideas Ability to demonstrate self-control will improve Ability to identify and develop effective coping behaviors will improve Ability to maintain clinical  measurements within normal limits will improve Compliance with prescribed medications will improve Ability to identify triggers associated with substance abuse/mental health issues will improve     Medication Management: Evaluate patient's response, side effects, and tolerance of medication regimen.  Therapeutic Interventions: 1 to 1 sessions, Unit Group sessions and Medication administration.  Evaluation of Outcomes: Not Progressing   RN Treatment Plan for Primary Diagnosis: Schizoaffective disorder, bipolar type (HCC) Long Term Goal(s): Knowledge of disease and therapeutic regimen to maintain health will improve  Short Term Goals: Ability to remain free from injury will improve, Ability to verbalize frustration and anger appropriately will improve, Ability to demonstrate self-control, Ability to participate in decision making will improve, Ability to verbalize feelings will improve, Ability to disclose and discuss suicidal ideas, Ability to identify and develop effective coping behaviors will improve, and Compliance with prescribed medications will improve  Medication Management: RN will administer medications as ordered by provider,  will assess and evaluate patient's response and provide education to patient for prescribed medication. RN will report any adverse and/or side effects to prescribing provider.  Therapeutic Interventions: 1 on 1 counseling sessions, Psychoeducation, Medication administration, Evaluate responses to treatment, Monitor vital signs and CBGs as ordered, Perform/monitor CIWA, COWS, AIMS and Fall Risk screenings as ordered, Perform wound care treatments as ordered.  Evaluation of Outcomes: Not Progressing   [name] Treatment Plan for Primary Diagnosis: Schizoaffective disorder, bipolar type (HCC) Long Term Goal(s): Safe transition to appropriate next level of care at discharge, Engage patient in therapeutic group addressing interpersonal concerns.  Short Term Goals:  Engage patient in aftercare planning with referrals and resources, Increase social support, Increase ability to appropriately verbalize feelings, Increase emotional regulation, Facilitate acceptance of mental health diagnosis and concerns, Identify triggers associated with mental health/substance abuse issues, and Increase skills for wellness and recovery  Therapeutic Interventions: Assess for all discharge needs, 1 to 1 time with Social worker, Explore available resources and support systems, Assess for adequacy in community support network, Educate family and significant other(s) on suicide prevention, Complete Psychosocial Assessment, Interpersonal group therapy.  Evaluation of Outcomes: Not Progressing   Progress in Treatment: Attending groups: Yes. Participating in groups: Yes. Taking medication as prescribed: Yes. Toleration medication: Yes. Family/Significant other contact made: No, will contact:  Pt declined consents Patient understands diagnosis: Yes. Discussing patient identified problems/goals with staff: Yes. Medical problems stabilized or resolved: Yes. Denies suicidal/homicidal ideation: Yes. Issues/concerns per patient self-inventory: No. Other: n/a  New problem(s) identified: No, Describe:  None  New Short Term/Long Term Goal(s): medication stabilization, elimination of SI thoughts, development of comprehensive mental wellness plan.   Patient Goals:  "The voices are really bothering me"  Discharge Plan or Barriers: Patient recently admitted. CSW will continue to follow and assess for appropriate referrals and possible discharge planning.   Reason for Continuation of Hospitalization: Aggression Anxiety Delusions  Medication stabilization Other; describe Mood stabilization, discharge planning  Estimated Length of Stay:  Last 3 [place] Suicide Severity Risk Score: Flowsheet Row Ad[facility] (Current) [facility]  from 01/11/2024 in [facility] Clinical Support from 11/14/2023 in [facility]  C-SSRS RISK CATEGORY No Risk No Risk No Risk       Last [facility] 2/9 Scores:    11/14/2023    9:48 AM 11/07/2023    9:50 AM 04/20/2023    4:08 PM  Depression screen PHQ 2/9  Decreased Interest 0 1 1  Down, Depressed, Hopeless 0 0 0  PHQ - 2 Score 0 1 1  Altered sleeping   1  Tired, decreased energy   0  Change in appetite   0  Feeling bad or failure about yourself    0  Trouble concentrating   3  Moving slowly or fidgety/restless   3  Suicidal thoughts   0  PHQ-9 Score   8  Difficult doing work/chores   Not difficult at all    Scribe for Treatment Team: Terricka Onofrio N Kosisochukwu [place], [name] 01/15/2024 3:03 PM

## 2024-01-16 DIAGNOSIS — F250 Schizoaffective disorder, bipolar type: Secondary | ICD-10-CM | POA: Diagnosis not present

## 2024-01-16 MED ORDER — NICOTINE POLACRILEX 2 MG MT GUM
2.0000 mg | CHEWING_GUM | OROMUCOSAL | Status: DC | PRN
  Administered 2024-01-16: 2 mg via ORAL
  Filled 2024-01-16: qty 1

## 2024-01-16 NOTE — Group Note (Signed)
 Date:  01/16/2024 Time:  8:24 PM  Group Topic/Focus:  Wrap-Up Group:   The focus of this group is to help patients review their daily goal of treatment and discuss progress on daily workbooks.    Participation Level:  Did Not Attend  Participation Quality:  n/a  Affect:  n/a  Cognitive:  n/a  Insight: None  Engagement in Group:  n/a  Modes of Intervention:  n/a  Additional Comments:  Patient did not attend wrap up group.  Dillard Frame 01/16/2024, 8:24 PM

## 2024-01-16 NOTE — Plan of Care (Signed)
  Problem: Coping: Goal: Ability to verbalize frustrations and anger appropriately will improve Outcome: Progressing   Problem: Safety: Goal: Periods of time without injury will increase Outcome: Progressing   Problem: Physical Regulation: Goal: Ability to maintain clinical measurements within normal limits will improve Outcome: Progressing

## 2024-01-16 NOTE — Group Note (Signed)
 Recreation Therapy Group Note   Group Topic:Problem Solving  Group Date: 01/16/2024 Start Time: 1045 End Time: 1110 Facilitators: Aevah Stansbery-McCall, LRT,CTRS Location: [address] Dayroom   Group Topic: Problem Solving  Goal Area(s) Addresses:  Patient will verbalize importance of using appropriate problem solving techniques.  Patient will identify positive change associated with effective problem solving skills.   Intervention: Worksheets, Pencils  Activity: Dentist. Patients were given two worksheets of brian teasers. Each worksheet had five rows with three puzzles each. Patients were given 15 minutes to complete as many of the puzzles as possible before they were gone over with LRT.  Patients had the option of working together to complete the worksheets.     Education Outcome: Acknowledges understanding/In group clarification offered/Needs additional education.    Affect/Mood: N/A   Participation Level: Did not attend    Clinical Observations/Individualized Feedback:     Plan: Continue to engage patient in RT group sessions 2-3x/week.   Easton Sivertson-McCall, LRT,CTRS 01/16/2024 1:18 PM

## 2024-01-16 NOTE — Plan of Care (Signed)
  Problem: Education: Goal: Emotional status will improve Outcome: Progressing Goal: Mental status will improve Outcome: Progressing   Problem: Coping: Goal: Ability to verbalize frustrations and anger appropriately will improve Outcome: Progressing Goal: Ability to demonstrate self-control will improve Outcome: Progressing   

## 2024-01-16 NOTE — Progress Notes (Signed)
   01/16/24 0900  Psych Admission Type (Psych Patients Only)  Admission Status Involuntary  Psychosocial Assessment  Patient Complaints Anxiety  Eye Contact Brief  Facial Expression Anxious  Affect Anxious  Speech Logical/coherent  Interaction Assertive;Cautious  Motor Activity Fidgety  Appearance/Hygiene In scrubs  Behavior Characteristics Anxious  Mood Preoccupied  Thought Process  Coherency WDL  Content Preoccupation  Delusions None reported or observed  Perception Hallucinations  Hallucination Auditory  Judgment Limited  Confusion None  Danger to Self  Current suicidal ideation? Denies  Agreement Not to Harm Self Yes  Description of Agreement Verbal  Danger to Others  Danger to Others None reported or observed

## 2024-01-16 NOTE — Progress Notes (Signed)
 Recreation Therapy Notes  INPATIENT RECREATION THERAPY ASSESSMENT  Patient Details Name: William Romero MRN: [id number] DOB: 17-Aug-1981 Today's Date: 01/16/2024       Information Obtained From: Patient  Able to Participate in Assessment/Interview: Yes  Patient Presentation:  (Drowsy)  Reason for Admission (Per Patient): Other (Comments) (Hearing voices)  Patient Stressors: Other (Comment) ("people talking to me")  Coping Skills:   Journal, Sports, Exercise, Meditate, Deep Breathing, Talk, Art, Prayer, Avoidance, Read, Hot Bath/Shower  Leisure Interests (2+):  Individual - Other (Comment) (get plans in order for the future)  Frequency of Recreation/Participation: Other (Comment) (twice a year)  Awareness of Community Resources:  Yes  Community Resources:  Park, Public affairs consultant  Current Use:    If no, Barriers?:  (pt mentioned sleeping in his car)  Expressed Interest in [employer] Information: No  County of Residence:  [place]  Patient Main Form of Transportation: Set designer  Patient Strengths:  Positive; Organizational; Learn quickly  Patient Identified Areas of Improvement:  "always improve daily"  Patient Goal for Hospitalization:  "stop hearing voices"  Current SI (including self-harm):  No  Current HI:  No  Current AVH: No  Staff Intervention Plan: Group Attendance, Collaborate with Interdisciplinary Treatment Team  Consent to Intern Participation: N/A   Zailey Audia-McCall, LRT,CTRS Abie Cheek A Maelin Kurkowski-McCall 01/16/2024, 1:46 PM

## 2024-01-16 NOTE — Progress Notes (Signed)
 [facility] MD Progress Note  01/16/2024 10:21 AM William Romero  MRN:  [id number]  Per chart review patient is compliant with medications on the unit.  As needed Atarax  for anxiety was used 3 times yesterday, as needed agitation protocol was used twice yesterday and once overnight at 4 AM.  Patient was reported by staff to be responding to stimuli in his room and noted to staff at times to be responding to stimuli in his room and reported to staff this morning that the voices are overwhelming.  Subjective:   Upon evaluation this morning patient was evaluated on the unit, he remembers this provider from previous hospitalization, he tells me that he came back to the hospital because "it was becoming worse" referring to the voices he denies command hallucinations to harm self or others, he does agree that during last hospital stay he was doing better and voices improved but also admits to noncompliance with medications without clear reason after discharge "I just stop" he does report some improvement on current medication regimen but continues to report auditory hallucinations "saying bad words and bad things", denies SI HI or VH.  He denies side effect to current medication regimen and does not appear sleepy, does not display any sign consistent with EPS or TD.  He does agree to start LAI during this hospital stay prior to discharge to increase long-term compliance and decrease risk of decompensation.    Per chart review during previous hospital stay patient responded to Prolixin  orally.  At this time I will continue oral risperidone , if effective will switch to Invega  sustain, if oral risperidone  is not effective will switch back to Prolixin  given history of good response in the past.    He tells me he lives alone and does not agree for this provider to contact his mother but I did discuss with him that prior to discharge we will have to contact some family member or friends to ensure safety after discharge,  will follow and revisit in the coming few days.  Patient also admits to illicit drug use including meth and THC, patient was counseled regarding need to abstain completely after discharge.   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 Augusta Blizzard 04/13/2023, poor followup Current therapist: none. Previous reported psychiatric diagnoses:  ?ADHD, schizoaffective disorder bipolar type, GAD Current psychiatric medications: none. Psychiatric medication history/compliance: Per chart review, poor with the exception of Adderall.  Full med trials include: adderall, bupropion, duloxetine , haloperidol , hydroxyzine  (ineffective), lorazepam , olanzapine , ziprasidone , paliperidone , risperidone ,  Psychiatric hospitalization(s): Approximately 6 stays over the past from 2012-2019.  Most recently, patient had 3 separate stays at [facility] in 2024, 6/11 - 6/19 and 6/22 - 7/1, and on 10/2023 Psychotherapy history: None Neuromodulation history: none. History of suicide (obtained from HPI): Reported 1 attempted OD in distant past, presently denied History of homicide or aggression (obtained in HPI): none.  Past Medical History:  Past Medical History:  Diagnosis Date   Paranoid schizophrenia (HCC) 10/03/2023   Schizophrenia (HCC)     Past Surgical History:  Procedure Laterality Date   APPENDECTOMY     Family History: History reviewed. No pertinent family history. Family Psychiatric  History:  Denies family psychiatric history  Social History:  Social History   Substance and Sexual Activity  Alcohol Use Not Currently   Alcohol/week: 5.0 standard drinks of alcohol   Types: 5  Cans of beer per week     Social History   Substance and Sexual Activity  Drug Use No    Social History   Socioeconomic History   Marital status: Single    Spouse name: Not on file   Number of children: Not on file   Years of education: Not on file   Highest education level: Not on file  Occupational History   Not on file  Tobacco Use   Smoking status: Former    Current packs/day: 0.50    Types: Cigarettes   Smokeless tobacco: Never  Vaping Use   Vaping status: Never Used  Substance and Sexual Activity   Alcohol use: Not Currently    Alcohol/week: 5.0 standard drinks of alcohol    Types: 5 Cans of beer per week   Drug use: No   Sexual activity: Not Currently  Other Topics Concern   Not on file  Social History Narrative   Not on file   Social Drivers of Health   Financial Resource Strain: Not on file  Food Insecurity: No Food Insecurity (01/14/2024)   Hunger Vital Sign    Worried About Running Out of Food in the Last Year: Never true    Ran Out of Food in the Last Year: Never true  Transportation Needs: No Transportation Needs (01/14/2024)   PRAPARE - Administrator, Civil Service (Medical): No    Lack of Transportation (Non-Medical): No  Physical Activity: Not on file  Stress: Not on file  Social Connections: Not on file   Additional Social History:                         Sleep: Fair  Appetite:  Fair  Current Medications: Current Facility-Administered Medications  Medication Dose Route Frequency Provider Last Rate Last Admin   acetaminophen  (TYLENOL ) tablet 650 mg  650 mg Oral Q6H PRN Onuoha, Josephine C, NP       alum & mag hydroxide-simeth (MAALOX/MYLANTA) 200-200-20 MG/5ML suspension 30 mL  30 mL Oral Q4H PRN Onuoha, Josephine C, NP       benztropine  (COGENTIN ) tablet 1 mg  1 mg Oral Q12H Zouev, Dmitri, MD   1 mg at 01/16/24 0855   haloperidol  (HALDOL ) tablet 5 mg  5 mg Oral TID PRN Onuoha, Josephine C, NP   5 mg at 01/16/24 4098    And   diphenhydrAMINE  (BENADRYL ) capsule 50 mg  50 mg Oral TID PRN Onuoha, Josephine C, NP   50 mg at 01/16/24 0348   haloperidol  lactate (HALDOL ) injection 5 mg  5 mg Intramuscular TID PRN Onuoha, Josephine C, NP       And   diphenhydrAMINE  (BENADRYL ) injection 50 mg  50 mg Intramuscular TID PRN Onuoha, Josephine C, NP       And   LORazepam  (ATIVAN ) injection 2 mg  2 mg Intramuscular TID PRN Onuoha, Josephine C, NP       haloperidol  lactate (HALDOL ) injection 10 mg  10 mg Intramuscular TID PRN Onuoha, Josephine C, NP       And   diphenhydrAMINE  (BENADRYL ) injection 50 mg  50 mg Intramuscular TID PRN Onuoha, Josephine C, NP       And   LORazepam  (ATIVAN ) injection 2 mg  2 mg Intramuscular TID PRN Onuoha, Josephine C, NP  divalproex  (DEPAKOTE  ER) 24 hr tablet 500 mg  500 mg Oral Daily Onuoha, Josephine C, NP   500 mg at 01/16/24 1610   divalproex  (DEPAKOTE  ER) 24 hr tablet 500 mg  500 mg Oral QHS Zouev, Dmitri, MD   500 mg at 01/15/24 2044   hydrOXYzine  (ATARAX ) tablet 25 mg  25 mg Oral TID PRN Bobbitt, Shalon E, NP   25 mg at 01/15/24 2046   LORazepam  (ATIVAN ) tablet 1 mg  1 mg Oral TID Zouev, Dmitri, MD   1 mg at 01/16/24 0856   mirtazapine (REMERON SOL-TAB) disintegrating tablet 15 mg  15 mg Oral QHS Onuoha, Josephine C, NP   15 mg at 01/15/24 2044   OLANZapine  zydis (ZYPREXA ) disintegrating tablet 5 mg  5 mg Oral QHS Zouev, Dmitri, MD   5 mg at 01/15/24 2044   risperiDONE  (RISPERDAL ) tablet 2 mg  2 mg Oral Q12H Zouev, Dmitri, MD   2 mg at 01/16/24 9604    Lab Results:  Results for orders placed or performed during the hospital encounter of 01/13/24 (from the past 48 hours)  TSH     Status: None   Collection Time: 01/15/24  6:26 AM  Result Value Ref Range   TSH 1.559 0.350 - 4.500 uIU/mL    Comment: Performed by a 3rd Generation assay with a functional sensitivity of <=0.01 uIU/mL. Performed at [facility], [address], [place], [place] [address]    Hemoglobin A1c     Status: None   Collection Time: 01/15/24  6:26 AM  Result Value Ref Range   Hgb A1c MFr Bld 5.2 4.8 - 5.6 %    Comment: (NOTE)         Prediabetes: 5.7 - 6.4         Diabetes: >6.4         Glycemic control for adults with diabetes: <7.0    Mean Plasma Glucose 103 mg/dL    Comment: (NOTE) Performed At: [facility] [address] [place], [place] [id number] Pearlean Botts MD FA:[id number]   Lipid panel     Status: Abnormal   Collection Time: 01/15/24  6:26 AM  Result Value Ref Range   Cholesterol 168 0 - 200 mg/dL   Triglycerides 696 (H) <150 mg/dL   HDL 42 >29 mg/dL   Total CHOL/HDL Ratio 4.0 RATIO   VLDL 34 0 - 40 mg/dL   LDL Cholesterol 92 0 - 99 mg/dL    Comment:        Total Cholesterol/HDL:CHD Risk Coronary Heart Disease Risk Table                     Men   Women  1/2 Average Risk   3.4   3.3  Average Risk       5.0   4.4  2 X Average Risk   9.6   7.1  3 X Average Risk  23.4   11.0        Use the calculated Patient Ratio above and the CHD Risk Table to determine the patient's CHD Risk.        ATP III CLASSIFICATION (LDL):  <100     mg/dL   Optimal  528-413  mg/dL   Near or Above                    Optimal  130-159  mg/dL   Borderline  244-010  mg/dL   High  >272  mg/dL   Very High Performed at [facility], [address] [place], [place] [address]     Blood Alcohol level:  Lab Results  Component Value Date   [facility] <15 01/11/2024   ETH <10 10/03/2023    Metabolic Disorder Labs: Lab Results  Component Value Date   HGBA1C 5.2 01/15/2024   MPG 103 01/15/2024   MPG 99.67 10/03/2023   No results found for: "PROLACTIN" Lab Results  Component Value Date   CHOL 168 01/15/2024   TRIG 172 (H) 01/15/2024   HDL 42 01/15/2024   CHOLHDL 4.0 01/15/2024   VLDL 34 01/15/2024   LDLCALC 92 01/15/2024   LDLCALC 98 10/03/2023    Physical Findings: AIMS:  , ,  ,  ,    CIWA:    COWS:      Musculoskeletal: Strength & Muscle Tone: within normal limits Gait & Station: normal Patient leans: N/A  Psychiatric Specialty Exam:  Presentation  General Appearance:  Bizarre; Disheveled  Eye Contact: Poor  Speech: Blocked  Speech Volume: Increased  Handedness: Right   Mood and Affect  Mood: Anxious; Dysphoric; Irritable; Labile  Affect: Congruent; Labile   Thought Process  Thought Processes: Disorganized  Descriptions of Associations:Tangential  Orientation:Full (Time, Place and Person)  Thought Content:Delusions; Illogical; Paranoid Ideation; Perseveration  History of Schizophrenia/Schizoaffective disorder:Yes  Duration of Psychotic Symptoms:Greater than six months  Hallucinations:Hallucinations: Auditory  Ideas of Reference:Percusatory; Paranoia; Delusions  Suicidal Thoughts:Suicidal Thoughts: No  Homicidal Thoughts:Homicidal Thoughts: No   Sensorium  Memory: Immediate Fair; Remote Poor  Judgment: Poor  Insight: Poor   Executive Functions  Concentration: Poor  Attention Span: Poor  Recall: [employer] of Knowledge: Fair  Language: Fair   Psychomotor Activity  Psychomotor Activity: Psychomotor Activity: Normal   Assets  Assets: Communication Skills; Desire for Improvement; Physical Health   Sleep  Sleep: Sleep: Fair    Physical Exam: Physical exam: Please see exam on admit note. General: Well developed, well nourished.  Pupils: Normal at 3mm Respiratory: Breathing is unlabored.  Cardiovascular: No edema.  Language: No anomia, no aphasia Muscle strength and tone-pt moving all extremities.  Gait not assessed as pt remained in bed.  Neuro: Facial muscles are symmetric. Pt without tremor, no evidence of hyperarousal.  Review of Systems  Constitutional: Negative.   HENT: Negative.    Eyes: Negative.   Respiratory: Negative.    Cardiovascular: Negative.   Gastrointestinal: Negative.   Genitourinary:  Negative.   Musculoskeletal: Negative.   Skin: Negative.   Neurological: Negative.   Endo/Heme/Allergies: Negative.   Psychiatric/Behavioral:  Positive for hallucinations and substance abuse. The patient is nervous/anxious.    Blood pressure 103/77, pulse (!) 104, temperature 98.4 F (36.9 C), temperature source Oral, resp. rate 18, height 6\' 2"  (1.88 m), weight 93.9 kg, SpO2 99%. Body mass index is 26.58 kg/m.   Treatment Plan Summary: Daily contact with patient to assess and evaluate symptoms and progress in treatment, Medication management, and Plan increase Risperdal  to 2 mg BID and start Ativan  1 mg TID x3 days for high anxiety, irritability  Patient remains floridly psychotic at this time and with paranoid persecutory delusions about his whole family inserting voices of sexual commentary into his head, denies they are command in nature and patient denies SI or HI Today. Appears to have been noncompliant with medications and outpatient followup.  Patient is poor historian and with poor insight and judgement. Cannot tell me what antipsychotics he has tried or for what duration  or what LAIs he has received. Would benefit from collateral which patient is not refusing due to paranoid persecutory delusions about all of his family. Paranoid about my intentions to get collateral.  Patient is asking for a sedating antipsychotic and out of LAI options I discussed  risperdal  will be most sedating to which patient is agreeable. Continues to require agitation protocol.  Patient UDS positive for amphetamine  and THC, Rule out substance induced psychosis.   IF patient has failed two antipsychotic trials of 6 weeks at least 1000 mg chlorpromazine equivalents he would benefit from Clozapine trial althought patient is unable to provide me this information stating "I don't know."     Treatment Plan Summary: Daily contact with patient to assess and evaluate symptoms and progress in treatment, Medication  management, and Plan     Observation Level/Precautions:  15 minute checks  Laboratory:  CBC Chemistry Profile Folic Acid HbAIC HCG UDS UA Vitamin B-12  Psychotherapy:  denies  Medications:  olanzapine , Cogentin   Consultations:    Discharge Concerns:  stabilization of psychosis and LAI  Estimated LOS: 5-7 days  Other:      Safety and Monitoring: INVOLUTARILY (completed second exam) admission to inpatient psychiatric unit for safety, stabilization and treatment Daily contact with patient to assess and evaluate symptoms and progress in treatment Patient's case to be discussed in multi-disciplinary team meeting Observation Level : q15 minute checks Vital signs: q12 hours Precautions: suicide, elopement, and assault   2. Psychiatric Problems #schizoaffective disorder bipolar type -cont  Depakote  500 mg BID Obtain Depakote  level on 5/15 to ensure therapeutic level and no toxicity -cont cogentin  1 mg BID -reduce Zyprexa  to 5 mg qhs as it is not available as a long acting and patient is noncompliant, with plan to discontinue completely to avoid polypharmacy - Continue Risperdal  to 2 mg BID Consider risperidone  LAI if oral risperidone  is effective and well-tolerated to improve long-term compliance and decrease risk of decompensation. If risperidone  is not effective we will switch to oral Prolixin  given history of good response during previous hospitalization. -cont hydroxyzine  50 mg q6h-PRN for anxiety - Continue Ativan  1 mg TID for 3 days for high anxiety, irritability and mood lability -cont trazodone  100 mg at bedtime-PRN for insomnia      3. Medical Management Covid negative CMP: wnl,calcium 8.5 CBC: unremarkable EtOH: <10 UDS: + amphetamine , +THC TSH: ordered A1C:  ordered, pending Lipids: triglycerides elevated otherwise wnl     #HTN -cont amlodipine  5 mg qdaily   Physician Treatment Plan for Primary Diagnosis: Schizophrenia, unspecified (HCC) Long Term Goal(s):  Improvement in symptoms so as ready for discharge   Short Term Goals: Ability to identify changes in lifestyle to reduce recurrence of condition will improve, Ability to verbalize feelings will improve, Ability to disclose and discuss suicidal ideas, Ability to demonstrate self-control will improve, Ability to identify and develop effective coping behaviors will improve, Ability to maintain clinical measurements within normal limits will improve, Compliance with prescribed medications will improve, and Ability to identify triggers associated with substance abuse/mental health issues will improve   Physician Treatment Plan for Secondary Diagnosis: Principal Problem:   Schizophrenia, unspecified (HCC)       I certify that inpatient services furnished can reasonably be expected to improve the patient's condition.      Brocha Gilliam Linnie Riches, MD 01/16/2024, 10:21 AM

## 2024-01-17 DIAGNOSIS — F250 Schizoaffective disorder, bipolar type: Secondary | ICD-10-CM | POA: Diagnosis not present

## 2024-01-17 MED ORDER — LORAZEPAM 2 MG/ML IJ SOLN
1.0000 mg | Freq: Once | INTRAMUSCULAR | Status: AC
  Administered 2024-01-17: 1 mg via INTRAMUSCULAR
  Filled 2024-01-17: qty 1

## 2024-01-17 MED ORDER — NICOTINE 14 MG/24HR TD PT24
14.0000 mg | MEDICATED_PATCH | Freq: Every day | TRANSDERMAL | Status: DC
  Administered 2024-01-17 – 2024-01-23 (×3): 14 mg via TRANSDERMAL
  Filled 2024-01-17 (×9): qty 1

## 2024-01-17 MED ORDER — RISPERIDONE 3 MG PO TABS
3.0000 mg | ORAL_TABLET | Freq: Two times a day (BID) | ORAL | Status: DC
  Administered 2024-01-17 – 2024-01-20 (×6): 3 mg via ORAL
  Filled 2024-01-17 (×8): qty 1

## 2024-01-17 MED ORDER — TRAZODONE HCL 100 MG PO TABS
100.0000 mg | ORAL_TABLET | Freq: Every day | ORAL | Status: DC
  Administered 2024-01-17: 100 mg via ORAL
  Filled 2024-01-17 (×2): qty 1

## 2024-01-17 NOTE — Progress Notes (Signed)
   01/16/24 2200  Psych Admission Type (Psych Patients Only)  Admission Status Involuntary  Psychosocial Assessment  Patient Complaints Anxiety  Eye Contact Brief  Facial Expression Anxious  Affect Anxious  Speech Logical/coherent  Interaction Assertive;Other (Comment) (Pt made multiple requests to leave and wrote a letter to this end. Pt became agitated in the d/t AH stating, "the voices are really getting to me and they are my 2 grandmas telling me I won't amount to anything.")  Motor Activity Fidgety;Restless  Appearance/Hygiene In scrubs  Behavior Characteristics Anxious;Guarded;Irritable;Agitated  Mood Preoccupied (Pt agitated at 0110. Pt presented as tense and muscles contracted with pressured speech and wanting to leave. Pacing the unit. IM Ativan  given as x1 order see MAR)  Thought Process  Coherency Tangential  Content Preoccupation  Delusions Persecutory  Perception Hallucinations  Hallucination Auditory  Judgment Limited  Confusion None  Danger to Self  Current suicidal ideation? Denies  Agreement Not to Harm Self Yes  Description of Agreement Verbal  Danger to Others  Danger to Others None reported or observed

## 2024-01-17 NOTE — Group Note (Signed)
 Recreation Therapy Group Note   Group Topic:Coping Skills  Group Date: 01/17/2024 Start Time: 1021 End Time: 1038 Facilitators: Issac Moure-McCall, LRT,CTRS Location: [address] Dayroom   Group Topic: Coping Skills  Goal Area(s) Addresses:  Patient will define what a coping skill is. Patient will identify how coping skills can be used post d/c.  Behavioral Response: N/A  Intervention: Worksheet, Pencils  Activity: Patients were given a sheet with a blank spider web. Patients will to identify the things they feel are holding them back from getting to where they want to be. Patients will write those struggles inside the web. Patients will then write the coping skills they can use to overcome those obstacles on the outside of the web.   Education: Pharmacologist, Building control surveyor.   Education Outcome: Acknowledges understanding/In group clarification offered/Needs additional education.    Affect/Mood: N/A   Participation Level: Did not attend    Clinical Observations/Individualized Feedback:     Plan: Continue to engage patient in RT group sessions 2-3x/week.   Isiaah Cuervo-McCall, LRT,CTRS 01/17/2024 1:23 PM

## 2024-01-17 NOTE — Group Note (Signed)
 Date:  01/17/2024 Time:  9:19 AM  Group Topic/Focus:  Goals Group:   The focus of this group is to help patients establish daily goals to achieve during treatment and discuss how the patient can incorporate goal setting into their daily lives to aide in recovery.    Participation Level:  Did Not Attend  Additional Comments:  Pt was aware that group was starting but chose not to attend.  Vallarie Gauze 01/17/2024, 9:19 AM

## 2024-01-17 NOTE — Plan of Care (Signed)
  Problem: Education: Goal: Knowledge of [place] [facility] Education information/materials will improve Outcome: Progressing   Problem: Education: Goal: Emotional status will improve Outcome: Not Progressing Goal: Mental status will improve Outcome: Not Progressing Goal: Verbalization of understanding the information provided will improve Outcome: Not Progressing   Problem: Activity: Goal: Interest or engagement in activities will improve Outcome: Not Progressing Pt was agitated and preoccupied with discharge

## 2024-01-17 NOTE — Progress Notes (Signed)
 Nursing 1:1 Note: Patient at medication window receiving evening medication. Patient confused and needing constant direction. 1:1 monitoring continues. Patient remains safe at this time.

## 2024-01-17 NOTE — Progress Notes (Signed)
 Patient entered a peer's room and laid in the extra bed. Patient was directed out of the room and instructed that he should remain in his room only. Patient became confused, pacing the halls, and arguing with staff. Increased agitation present that was not relieved with verbal redirection. Patient was offered PO agitation protocol, but refused on 3 separate occasions. Patient continued to escalate, so IM agitation protocol administered, per MAR. Safety checks continue. Patient remains safe at this time.

## 2024-01-17 NOTE — Group Note (Signed)
 Date:  01/17/2024 Time:  8:55 PM  Group Topic/Focus:  Wrap-Up Group:   The focus of this group is to help patients review their daily goal of treatment and discuss progress on daily workbooks.    Participation Level:  Did Not Attend  Calen Posch Dacosta 01/17/2024, 8:55 PM

## 2024-01-17 NOTE — Progress Notes (Signed)
 [facility] MD Progress Note  01/17/2024 9:27 AM William Romero  MRN:  [id number]  Per chart review patient is compliant with medications on the unit.  As needed Atarax  for anxiety was used 3 times on 5/12, as needed agitation protocol was used twice yesterday 5/13.  Patient was reported by staff to be responding to stimuli in his room and reporting to staff on and off having loud hallucinations.  Subjective:   Patient was assessed in his room today, he reports interrupted sleep but fair appetite he denies visual hallucination but continues to report hearing voices "saying bad words to me it is bothering me" with further discussion he notes that the voices are slightly better with current medications he notes medications are helpful "reduces the voices for 5 or 10 minutes but it comes back" he scales auditory hallucinations in form of severity today as 5 out of 1010 being the worst.  He denies feeling depressed he denies SI or HI.  He denies paranoia or other delusions but continues to display some nonlinear thought process and disorganized thought process on and off during conversation.  He denies side effect of current medication regimen, does not appear sleepy, does not display any sign consistent with EPS or TD.  I discussed with patient titrating risperidone  to target psychosis control and he agrees with the plan. He continues to be reluctant for staff to call his mother but I discussed with him that prior to discharge we will have to contact his family member for safety planning, will follow.  Patient also admits to illicit drug use including meth and THC, patient was counseled regarding need to abstain completely after discharge.   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 Augusta Blizzard 04/13/2023, poor followup Current therapist: none. Previous reported psychiatric diagnoses:  ?ADHD, schizoaffective disorder bipolar type, GAD Current psychiatric medications: none. Psychiatric medication history/compliance: Per chart review, poor with the exception of Adderall.  Full med trials include: adderall, bupropion, duloxetine , haloperidol , hydroxyzine  (ineffective), lorazepam , olanzapine , ziprasidone , paliperidone , risperidone ,  Psychiatric hospitalization(s): Approximately 6 stays over the past from 2012-2019.  Most recently, patient had 3 separate stays at [facility] in 2024, 6/11 - 6/19 and 6/22 - 7/1, and on 10/2023 Psychotherapy history: None Neuromodulation history: none. History of suicide (obtained from HPI): Reported 1 attempted OD in distant past, presently denied History of homicide or aggression (obtained in HPI): none.  Past Medical History:  Past Medical History:  Diagnosis Date   Paranoid schizophrenia (HCC) 10/03/2023   Schizophrenia (HCC)     Past Surgical History:  Procedure Laterality Date   APPENDECTOMY     Family History: History reviewed. No pertinent family history. Family Psychiatric  History:  Denies family psychiatric history  Social History:  Social History   Substance and Sexual Activity  Alcohol Use Not Currently   Alcohol/week: 5.0 standard drinks of alcohol   Types: 5 Cans of beer per week     Social History   Substance and Sexual Activity  Drug Use No    Social History   Socioeconomic History   Marital status: Single    Spouse name: Not on file   Number of children: Not on file   Years of education: Not  on file   Highest education level: Not on file  Occupational History   Not on file  Tobacco Use   Smoking status: Former    Current packs/day: 0.50    Types: Cigarettes   Smokeless tobacco: Never   Vaping Use   Vaping status: Never Used  Substance and Sexual Activity   Alcohol use: Not Currently    Alcohol/week: 5.0 standard drinks of alcohol    Types: 5 Cans of beer per week   Drug use: No   Sexual activity: Not Currently  Other Topics Concern   Not on file  Social History Narrative   Not on file   Social Drivers of Health   Financial Resource Strain: Not on file  Food Insecurity: No Food Insecurity (01/14/2024)   Hunger Vital Sign    Worried About Running Out of Food in the Last Year: Never true    Ran Out of Food in the Last Year: Never true  Transportation Needs: No Transportation Needs (01/14/2024)   PRAPARE - Administrator, Civil Service (Medical): No    Lack of Transportation (Non-Medical): No  Physical Activity: Not on file  Stress: Not on file  Social Connections: Not on file   Additional Social History:                         Sleep: Fair  Appetite:  Fair  Current Medications: Current Facility-Administered Medications  Medication Dose Route Frequency Provider Last Rate Last Admin   acetaminophen  (TYLENOL ) tablet 650 mg  650 mg Oral Q6H PRN Michel Agreste, Josephine C, NP       alum & mag hydroxide-simeth (MAALOX/MYLANTA) 200-200-20 MG/5ML suspension 30 mL  30 mL Oral Q4H PRN Onuoha, Josephine C, NP       benztropine  (COGENTIN ) tablet 1 mg  1 mg Oral Q12H Zouev, Dmitri, MD   1 mg at 01/17/24 1308   haloperidol  (HALDOL ) tablet 5 mg  5 mg Oral TID PRN Onuoha, Josephine C, NP   5 mg at 01/16/24 2130   And   diphenhydrAMINE  (BENADRYL ) capsule 50 mg  50 mg Oral TID PRN Onuoha, Josephine C, NP   50 mg at 01/16/24 2130   haloperidol  lactate (HALDOL ) injection 5 mg  5 mg Intramuscular TID PRN Onuoha, Josephine C, NP       And   diphenhydrAMINE  (BENADRYL ) injection 50 mg  50 mg Intramuscular TID PRN Onuoha, Josephine C, NP       And   LORazepam  (ATIVAN ) injection 2 mg  2 mg Intramuscular TID PRN Onuoha, Josephine C, NP       haloperidol  lactate  (HALDOL ) injection 10 mg  10 mg Intramuscular TID PRN Onuoha, Josephine C, NP       And   diphenhydrAMINE  (BENADRYL ) injection 50 mg  50 mg Intramuscular TID PRN Onuoha, Josephine C, NP       And   LORazepam  (ATIVAN ) injection 2 mg  2 mg Intramuscular TID PRN Onuoha, Josephine C, NP       divalproex  (DEPAKOTE  ER) 24 hr tablet 500 mg  500 mg Oral Daily Onuoha, Josephine C, NP   500 mg at 01/17/24 6578   divalproex  (DEPAKOTE  ER) 24 hr tablet 500 mg  500 mg Oral QHS Zouev, Dmitri, MD   500 mg at 01/16/24 1947   hydrOXYzine  (ATARAX ) tablet 25 mg  25 mg Oral TID PRN Bobbitt, Shalon E, NP   25 mg at 01/15/24 2046   LORazepam  (  ATIVAN ) tablet 1 mg  1 mg Oral TID Zouev, Dmitri, MD   1 mg at 01/17/24 0822   mirtazapine (REMERON SOL-TAB) disintegrating tablet 15 mg  15 mg Oral QHS Onuoha, Josephine C, NP   15 mg at 01/16/24 1950   nicotine  (NICODERM CQ  - dosed in mg/24 hours) patch 14 mg  14 mg Transdermal Daily Ntuen, Tina C, FNP       nicotine  polacrilex (NICORETTE ) gum 2 mg  2 mg Oral PRN Zouev, Dmitri, MD   2 mg at 01/16/24 1706   OLANZapine  zydis (ZYPREXA ) disintegrating tablet 5 mg  5 mg Oral QHS Zouev, Dmitri, MD   5 mg at 01/16/24 1949   risperiDONE  (RISPERDAL ) tablet 2 mg  2 mg Oral Q12H Zouev, Dmitri, MD   2 mg at 01/17/24 0272    Lab Results:  No results found for this or any previous visit (from the past 48 hours).   Blood Alcohol level:  Lab Results  Component Value Date   [facility] <15 01/11/2024   ETH <10 10/03/2023    Metabolic Disorder Labs: Lab Results  Component Value Date   HGBA1C 5.2 01/15/2024   MPG 103 01/15/2024   MPG 99.67 10/03/2023   No results found for: "PROLACTIN" Lab Results  Component Value Date   CHOL 168 01/15/2024   TRIG 172 (H) 01/15/2024   HDL 42 01/15/2024   CHOLHDL 4.0 01/15/2024   VLDL 34 01/15/2024   LDLCALC 92 01/15/2024   LDLCALC 98 10/03/2023    Physical Findings: AIMS:  , ,  ,  ,    CIWA:    COWS:     Musculoskeletal: Strength & Muscle  Tone: within normal limits Gait & Station: normal Patient leans: N/A  Psychiatric Specialty Exam:  Presentation  General Appearance:  Bizarre; Disheveled  Eye Contact: Poor  Speech: Blocked  Speech Volume: Increased  Handedness: Right   Mood and Affect  Mood: Anxious; Dysphoric; Irritable; Labile  Affect: Congruent; Labile   Thought Process  Thought Processes: Disorganized  Descriptions of Associations:Tangential  Orientation:Full (Time, Place and Person)  Thought Content:Delusions; Illogical; Paranoid Ideation; Perseveration  History of Schizophrenia/Schizoaffective disorder:Yes  Duration of Psychotic Symptoms:Greater than six months  Hallucinations:No data recorded  Ideas of Reference:Percusatory; Paranoia; Delusions  Suicidal Thoughts:No data recorded  Homicidal Thoughts:No data recorded   Sensorium  Memory: Immediate Fair; Remote Poor  Judgment: Poor  Insight: Poor   Executive Functions  Concentration: Poor  Attention Span: Poor  Recall: [employer] of Knowledge: Fair  Language: Fair   Psychomotor Activity  Psychomotor Activity: No data recorded   Assets  Assets: Communication Skills; Desire for Improvement; Physical Health   Sleep  Sleep: No data recorded    Physical Exam: Physical exam: Please see exam on admit note. General: Well developed, well nourished.  Pupils: Normal at 3mm Respiratory: Breathing is unlabored.  Cardiovascular: No edema.  Language: No anomia, no aphasia Muscle strength and tone-pt moving all extremities.  Gait not assessed as pt remained in bed.  Neuro: Facial muscles are symmetric. Pt without tremor, no evidence of hyperarousal.  Review of Systems  Constitutional: Negative.   HENT: Negative.    Eyes: Negative.   Respiratory: Negative.    Cardiovascular: Negative.   Gastrointestinal: Negative.   Genitourinary: Negative.   Musculoskeletal: Negative.   Skin: Negative.    Neurological: Negative.   Endo/Heme/Allergies: Negative.   Psychiatric/Behavioral:  Positive for hallucinations and substance abuse. The patient is nervous/anxious.    Blood pressure 114/89,  pulse (!) 102, temperature 98.2 F (36.8 C), temperature source Oral, resp. rate 18, height 6\' 2"  (1.88 m), weight 93.9 kg, SpO2 99%. Body mass index is 26.58 kg/m.   Treatment Plan Summary: Daily contact with patient to assess and evaluate symptoms and progress in treatment, Medication management, and Plan increase Risperdal  to 2 mg BID and start Ativan  1 mg TID x3 days for high anxiety, irritability  Patient remains floridly psychotic at this time and with paranoid persecutory delusions about his whole family inserting voices of sexual commentary into his head, denies they are command in nature and patient denies SI or HI Today. Appears to have been noncompliant with medications and outpatient followup.  Patient is poor historian and with poor insight and judgement. Cannot tell me what antipsychotics he has tried or for what duration or what LAIs he has received. Would benefit from collateral which patient is not refusing due to paranoid persecutory delusions about all of his family. Paranoid about my intentions to get collateral.  Patient is asking for a sedating antipsychotic and out of LAI options I discussed  risperdal  will be most sedating to which patient is agreeable. Continues to require agitation protocol.  Patient UDS positive for amphetamine  and THC, Rule out substance induced psychosis.   IF patient has failed two antipsychotic trials of 6 weeks at least 1000 mg chlorpromazine equivalents he would benefit from Clozapine trial althought patient is unable to provide me this information stating "I don't know."     Treatment Plan Summary: Daily contact with patient to assess and evaluate symptoms and progress in treatment, Medication management, and Plan     Observation Level/Precautions:  15  minute checks  Laboratory:  CBC Chemistry Profile Folic Acid HbAIC HCG UDS UA Vitamin B-12  Psychotherapy:  denies  Medications:  olanzapine , Cogentin   Consultations:    Discharge Concerns:  stabilization of psychosis and LAI  Estimated LOS: 5-7 days  Other:      Safety and Monitoring: INVOLUTARILY (completed second exam) admission to inpatient psychiatric unit for safety, stabilization and treatment Daily contact with patient to assess and evaluate symptoms and progress in treatment Patient's case to be discussed in multi-disciplinary team meeting Observation Level : q15 minute checks Vital signs: q12 hours Precautions: suicide, elopement, and assault   2. Psychiatric Problems #schizoaffective disorder bipolar type -cont  Depakote  500 mg BID Obtain Depakote  level on 5/15 to ensure therapeutic level and no toxicity -cont cogentin  1 mg BID for EPS prophylaxis -Discontinue Zyprexa  to avoid polypharmacy - Titrate risperidone  from 2-3 mg BID Consider risperidone  LAI if oral risperidone  is effective and well-tolerated to improve long-term compliance and decrease risk of decompensation. If risperidone  is not effective we will switch to oral Prolixin  given history of good response during previous hospitalization. -cont hydroxyzine  50 mg q6h-PRN for anxiety - Continue Ativan  1 mg TID for 3 days for high anxiety, irritability and mood lability - Trazodone  100 mg at bedtime scheduled for insomnia      3. Medical Management Covid negative CMP: wnl,calcium 8.5 CBC: unremarkable EtOH: <10 UDS: + amphetamine , +THC TSH: ordered A1C:  ordered, pending Lipids: triglycerides elevated otherwise wnl     #HTN -cont amlodipine  5 mg qdaily   Physician Treatment Plan for Primary Diagnosis: Schizophrenia, unspecified (HCC) Long Term Goal(s): Improvement in symptoms so as ready for discharge   Short Term Goals: Ability to identify changes in lifestyle to reduce recurrence of condition  will improve, Ability to verbalize feelings will improve, Ability to disclose  and discuss suicidal ideas, Ability to demonstrate self-control will improve, Ability to identify and develop effective coping behaviors will improve, Ability to maintain clinical measurements within normal limits will improve, Compliance with prescribed medications will improve, and Ability to identify triggers associated with substance abuse/mental health issues will improve   Physician Treatment Plan for Secondary Diagnosis: Principal Problem:   Schizophrenia, unspecified (HCC)       I certify that inpatient services furnished can reasonably be expected to improve the patient's condition.      Malayna Noori Linnie Riches, MD 01/17/2024, 9:27 AM

## 2024-01-17 NOTE — Progress Notes (Signed)
 Patient continues to try to enter the rooms of his peers. Patient redirected back to his room. Patient states, "Didn't someone ask me to move?" Safety checks continue. Patient remains safe at this time.

## 2024-01-17 NOTE — Plan of Care (Signed)
   Problem: Education: Goal: Emotional status will improve Outcome: Progressing Goal: Mental status will improve Outcome: Progressing Goal: Verbalization of understanding the information provided will improve Outcome: Progressing

## 2024-01-17 NOTE — Progress Notes (Signed)
   01/17/24 1158  Psych Admission Type (Psych Patients Only)  Admission Status Involuntary  Psychosocial Assessment  Patient Complaints Anxiety  Eye Contact Brief  Facial Expression Anxious  Affect Apathetic  Speech Logical/coherent  Interaction Assertive  Motor Activity Fidgety;Restless  Appearance/Hygiene In scrubs  Behavior Characteristics Anxious;Restless  Mood Preoccupied  Thought Process  Coherency WDL  Content Preoccupation  Delusions None reported or observed  Perception Hallucinations  Hallucination Auditory  Judgment Limited  Confusion None  Danger to Self  Current suicidal ideation? Denies  Agreement Not to Harm Self Yes  Description of Agreement Verbal  Danger to Others  Danger to Others None reported or observed

## 2024-01-18 DIAGNOSIS — F250 Schizoaffective disorder, bipolar type: Secondary | ICD-10-CM | POA: Diagnosis not present

## 2024-01-18 LAB — VALPROIC ACID LEVEL: Valproic Acid Lvl: 47 ug/mL — ABNORMAL LOW (ref 50–100)

## 2024-01-18 MED ORDER — TRAZODONE HCL 100 MG PO TABS
200.0000 mg | ORAL_TABLET | Freq: Every day | ORAL | Status: DC
  Administered 2024-01-18 – 2024-01-22 (×5): 200 mg via ORAL
  Filled 2024-01-18 (×7): qty 2

## 2024-01-18 MED ORDER — WHITE PETROLATUM EX OINT
TOPICAL_OINTMENT | CUTANEOUS | Status: AC
  Filled 2024-01-18: qty 5

## 2024-01-18 MED ORDER — LORAZEPAM 1 MG PO TABS
1.0000 mg | ORAL_TABLET | Freq: Three times a day (TID) | ORAL | Status: AC
  Administered 2024-01-18 – 2024-01-21 (×8): 1 mg via ORAL
  Filled 2024-01-18 (×8): qty 1

## 2024-01-18 NOTE — Progress Notes (Signed)
 [facility] MD Progress Note  01/18/2024 10:12 AM William Romero  MRN:  [id number]  Per chart review patient is compliant with medications on the unit.  As needed Atarax  for anxiety was used 3 times on 5/12, as needed agitation protocol was used twice 5/13 and once on 5/14.  Patient was reported by staff to be responding to stimuli in his room and reporting to staff on and off having loud hallucinations. Patient was also reported by staff on 5/14 to be restless and disorganized walking into other patient's room overnight not redirectable and resisting staff when attempting to redirect him.  Subjective:   Patient was assessed in his room today, he continues to report auditory hallucinations noting the voices "they are saying this on his things I get angry" he reports that the reason he talks to himself "to correct what they are saying I can just let them say what they want"  Was reported to have poor sleep last night 2.5 hours.  Discussed with patient titrating trazodone  to help with sleep.  He had to be placed on one-to-one yesterday secondary to psychosis and disruptive behavior, will continue one-to-one precaution.  He denies SI or HI or VH.  He denies side effect of medications, does not appear sleepy and does not appear to have any sign consistent with EPS or TD.  Chart review indicates history of response to Prolixin  orally during previous hospitalization.  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 Augusta Blizzard 04/13/2023, poor followup Current therapist: none. Previous reported psychiatric diagnoses:  ?ADHD, schizoaffective disorder bipolar type,  GAD Current psychiatric medications: none. Psychiatric medication history/compliance: Per chart review, poor with the exception of Adderall.  Full med trials include: adderall, bupropion, duloxetine , haloperidol , hydroxyzine  (ineffective), lorazepam , olanzapine , ziprasidone , paliperidone , risperidone ,  Psychiatric hospitalization(s): Approximately 6 stays over the past from 2012-2019.  Most recently, patient had 3 separate stays at [facility] in 2024, 6/11 - 6/19 and 6/22 - 7/1, and on 10/2023 Psychotherapy history: None Neuromodulation history: none. History of suicide (obtained from HPI): Reported 1 attempted OD in distant past, presently denied History of homicide or aggression (obtained in HPI): none.  Past Medical History:  Past Medical History:  Diagnosis Date   Paranoid schizophrenia (HCC) 10/03/2023   Schizophrenia (HCC)     Past Surgical History:  Procedure Laterality Date   APPENDECTOMY     Family History: History reviewed. No pertinent family history. Family Psychiatric  History:  Denies family psychiatric history  Social History:  Social History   Substance and Sexual Activity  Alcohol Use Not Currently   Alcohol/week: 5.0 standard drinks of alcohol   Types: 5 Cans of beer per week     Social History   Substance and Sexual Activity  Drug Use No    Social History   Socioeconomic History   Marital status: Single    Spouse name: Not on file   Number of children: Not on file   Years of education: Not on file   Highest education level: Not on file  Occupational History   Not on file  Tobacco Use   Smoking status: Former    Current packs/day: 0.50    Types: Cigarettes   Smokeless tobacco: Never  Vaping  Use   Vaping status: Never Used  Substance and Sexual Activity   Alcohol use: Not Currently    Alcohol/week: 5.0 standard drinks of alcohol    Types: 5 Cans of beer per week   Drug use: No   Sexual activity: Not Currently  Other Topics Concern   Not on file   Social History Narrative   Not on file   Social Drivers of Health   Financial Resource Strain: Not on file  Food Insecurity: No Food Insecurity (01/14/2024)   Hunger Vital Sign    Worried About Running Out of Food in the Last Year: Never true    Ran Out of Food in the Last Year: Never true  Transportation Needs: No Transportation Needs (01/14/2024)   PRAPARE - Administrator, Civil Service (Medical): No    Lack of Transportation (Non-Medical): No  Physical Activity: Not on file  Stress: Not on file  Social Connections: Not on file   Additional Social History:                         Sleep: Fair  Appetite:  Fair  Current Medications: Current Facility-Administered Medications  Medication Dose Route Frequency Provider Last Rate Last Admin   acetaminophen  (TYLENOL ) tablet 650 mg  650 mg Oral Q6H PRN Onuoha, Josephine C, NP   650 mg at 01/18/24 0948   alum & mag hydroxide-simeth (MAALOX/MYLANTA) 200-200-20 MG/5ML suspension 30 mL  30 mL Oral Q4H PRN Onuoha, Josephine C, NP       benztropine  (COGENTIN ) tablet 1 mg  1 mg Oral Q12H Zouev, Dmitri, MD   1 mg at 01/18/24 2956   haloperidol  (HALDOL ) tablet 5 mg  5 mg Oral TID PRN Onuoha, Josephine C, NP   5 mg at 01/16/24 2130   And   diphenhydrAMINE  (BENADRYL ) capsule 50 mg  50 mg Oral TID PRN Onuoha, Josephine C, NP   50 mg at 01/16/24 2130   haloperidol  lactate (HALDOL ) injection 5 mg  5 mg Intramuscular TID PRN Onuoha, Josephine C, NP   5 mg at 01/17/24 1539   And   diphenhydrAMINE  (BENADRYL ) injection 50 mg  50 mg Intramuscular TID PRN Onuoha, Josephine C, NP       And   LORazepam  (ATIVAN ) injection 2 mg  2 mg Intramuscular TID PRN Onuoha, Josephine C, NP       haloperidol  lactate (HALDOL ) injection 10 mg  10 mg Intramuscular TID PRN Onuoha, Josephine C, NP       And   diphenhydrAMINE  (BENADRYL ) injection 50 mg  50 mg Intramuscular TID PRN Onuoha, Josephine C, NP   50 mg at 01/17/24 1540   And   LORazepam   (ATIVAN ) injection 2 mg  2 mg Intramuscular TID PRN Onuoha, Josephine C, NP   2 mg at 01/17/24 1539   divalproex  (DEPAKOTE  ER) 24 hr tablet 500 mg  500 mg Oral Daily Onuoha, Josephine C, NP   500 mg at 01/18/24 2130   divalproex  (DEPAKOTE  ER) 24 hr tablet 500 mg  500 mg Oral QHS Zouev, Dmitri, MD   500 mg at 01/17/24 2113   hydrOXYzine  (ATARAX ) tablet 25 mg  25 mg Oral TID PRN Bobbitt, Shalon E, NP   25 mg at 01/15/24 2046   mirtazapine (REMERON SOL-TAB) disintegrating tablet 15 mg  15 mg Oral QHS Onuoha, Josephine C, NP   15 mg at 01/17/24 2113   nicotine  (NICODERM CQ  - dosed in mg/24 hours)  patch 14 mg  14 mg Transdermal Daily Ntuen, Tina C, FNP   14 mg at 01/17/24 1235   nicotine  polacrilex (NICORETTE ) gum 2 mg  2 mg Oral PRN Zouev, Dmitri, MD   2 mg at 01/16/24 1706   risperiDONE  (RISPERDAL ) tablet 3 mg  3 mg Oral Q12H Yliana Gravois, MD   3 mg at 01/18/24 9562   traZODone  (DESYREL ) tablet 200 mg  200 mg Oral QHS Joseluis Alessio, MD        Lab Results:  Results for orders placed or performed during the hospital encounter of 01/13/24 (from the past 48 hours)  Valproic acid level     Status: Abnormal   Collection Time: 01/18/24  6:52 AM  Result Value Ref Range   Valproic Acid Lvl 47 (L) 50 - 100 ug/mL    Comment: Performed at [facility], [address] [place], [place] [address]     Blood Alcohol level:  Lab Results  Component Value Date   [facility] <15 01/11/2024   ETH <10 10/03/2023    Metabolic Disorder Labs: Lab Results  Component Value Date   HGBA1C 5.2 01/15/2024   MPG 103 01/15/2024   MPG 99.67 10/03/2023   No results found for: "PROLACTIN" Lab Results  Component Value Date   CHOL 168 01/15/2024   TRIG 172 (H) 01/15/2024   HDL 42 01/15/2024   CHOLHDL 4.0 01/15/2024   VLDL 34 01/15/2024   LDLCALC 92 01/15/2024   LDLCALC 98 10/03/2023    Physical Findings: AIMS:  , ,  ,  ,    CIWA:    COWS:     Musculoskeletal: Strength & Muscle Tone: within  normal limits Gait & Station: normal Patient leans: N/A  Psychiatric Specialty Exam:  Presentation  General Appearance:  Bizarre; Disheveled  Eye Contact: Poor  Speech: Blocked  Speech Volume: Increased  Handedness: Right   Mood and Affect  Mood: Anxious; Dysphoric; Irritable; Labile  Affect: Congruent; Labile   Thought Process  Thought Processes: Disorganized  Descriptions of Associations:Tangential  Orientation:Full (Time, Place and Person)  Thought Content:Delusions; Illogical; Paranoid Ideation; Perseveration  History of Schizophrenia/Schizoaffective disorder:Yes  Duration of Psychotic Symptoms:Greater than six months  Hallucinations:No data recorded  Ideas of Reference:Percusatory; Paranoia; Delusions  Suicidal Thoughts:No data recorded  Homicidal Thoughts:No data recorded   Sensorium  Memory: Immediate Fair; Remote Poor  Judgment: Poor  Insight: Poor   Executive Functions  Concentration: Poor  Attention Span: Poor  Recall: [employer] of Knowledge: Fair  Language: Fair   Psychomotor Activity  Psychomotor Activity: No data recorded   Assets  Assets: Communication Skills; Desire for Improvement; Physical Health   Sleep  Sleep: No data recorded    Physical Exam: Physical exam: Please see exam on admit note. General: Well developed, well nourished.  Pupils: Normal at 3mm Respiratory: Breathing is unlabored.  Cardiovascular: No edema.  Language: No anomia, no aphasia Muscle strength and tone-pt moving all extremities.  Gait not assessed as pt remained in bed.  Neuro: Facial muscles are symmetric. Pt without tremor, no evidence of hyperarousal.  Review of Systems  Constitutional: Negative.   HENT: Negative.    Eyes: Negative.   Respiratory: Negative.    Cardiovascular: Negative.   Gastrointestinal: Negative.   Genitourinary: Negative.   Musculoskeletal: Negative.   Skin: Negative.   Neurological:  Negative.   Endo/Heme/Allergies: Negative.   Psychiatric/Behavioral:  Positive for hallucinations and substance abuse. The patient is nervous/anxious.    Blood pressure 114/89,  pulse (!) 102, temperature 98.2 F (36.8 C), temperature source Oral, resp. rate 18, height 6\' 2"  (1.88 m), weight 93.9 kg, SpO2 99%. Body mass index is 26.58 kg/m.   Treatment Plan Summary: Daily contact with patient to assess and evaluate symptoms and progress in treatment, Medication management, and Plan increase Risperdal  to 2 mg BID and start Ativan  1 mg TID x3 days for high anxiety, irritability  Patient remains floridly psychotic at this time and with paranoid persecutory delusions about his whole family inserting voices of sexual commentary into his head, denies they are command in nature and patient denies SI or HI Today. Appears to have been noncompliant with medications and outpatient followup.  Patient is poor historian and with poor insight and judgement. Cannot tell me what antipsychotics he has tried or for what duration or what LAIs he has received. Would benefit from collateral which patient is not refusing due to paranoid persecutory delusions about all of his family. Paranoid about my intentions to get collateral.  Patient is asking for a sedating antipsychotic and out of LAI options I discussed  risperdal  will be most sedating to which patient is agreeable. Continues to require agitation protocol.  Patient UDS positive for amphetamine  and THC, Rule out substance induced psychosis.   IF patient has failed two antipsychotic trials of 6 weeks at least 1000 mg chlorpromazine equivalents he would benefit from Clozapine trial althought patient is unable to provide me this information stating "I don't know."     Treatment Plan Summary: Daily contact with patient to assess and evaluate symptoms and progress in treatment, Medication management, and Plan     Observation Level/Precautions:  15 minute checks   Laboratory:  CBC Chemistry Profile Folic Acid HbAIC HCG UDS UA Vitamin B-12  Psychotherapy:  denies  Medications:  olanzapine , Cogentin   Consultations:    Discharge Concerns:  stabilization of psychosis and LAI  Estimated LOS: 5-7 days  Other:      Safety and Monitoring: INVOLUTARILY (completed second exam) admission to inpatient psychiatric unit for safety, stabilization and treatment Daily contact with patient to assess and evaluate symptoms and progress in treatment Patient's case to be discussed in multi-disciplinary team meeting Observation Level : q15 minute checks Vital signs: q12 hours Precautions: suicide, elopement, and assault   2. Psychiatric Problems #schizoaffective disorder bipolar type -cont  Depakote  500 mg BID Obtain Depakote  level on 5/15 to ensure therapeutic level and no toxicity -cont cogentin  1 mg BID for EPS prophylaxis -Discontinue Zyprexa  to avoid polypharmacy - Continue risperidone  3 mg BID, dose was titrated on 5/14 Consider risperidone  LAI if oral risperidone  is effective and well-tolerated to improve long-term compliance and decrease risk of decompensation. If risperidone  is not effective we will switch to oral Prolixin  given history of good response during previous hospitalization. -cont hydroxyzine  50 mg q6h-PRN for anxiety  - Continue Ativan  1 mg TID for 3 more days for high anxiety, irritability and mood lability - Titrate trazodone  to 200 mg at bedtime scheduled for insomnia      3. Medical Management Covid negative CMP: wnl,calcium 8.5 CBC: unremarkable EtOH: <10 UDS: + amphetamine , +THC TSH: ordered A1C:  ordered, pending Lipids: triglycerides elevated otherwise wnl     #HTN -cont amlodipine  5 mg qdaily   Physician Treatment Plan for Primary Diagnosis: Schizophrenia, unspecified (HCC) Long Term Goal(s): Improvement in symptoms so as ready for discharge   Short Term Goals: Ability to identify changes in lifestyle to reduce  recurrence of condition will improve, Ability  to verbalize feelings will improve, Ability to disclose and discuss suicidal ideas, Ability to demonstrate self-control will improve, Ability to identify and develop effective coping behaviors will improve, Ability to maintain clinical measurements within normal limits will improve, Compliance with prescribed medications will improve, and Ability to identify triggers associated with substance abuse/mental health issues will improve   Physician Treatment Plan for Secondary Diagnosis: Principal Problem:   Schizophrenia, unspecified (HCC)       I certify that inpatient services furnished can reasonably be expected to improve the patient's condition.      Navon Kotowski Linnie Riches, MD 01/18/2024, 10:12 AM

## 2024-01-18 NOTE — Progress Notes (Signed)
 1:1 Progress Note   Patient is observed laying in bed asleep. Sitter within eye sight of patient.    Sitter remains for safety. 15 minute checks continued for safety.    Patient safe on the unit.

## 2024-01-18 NOTE — Progress Notes (Signed)
 D: Patient is alert and cooperative. Patient endorses AH "saying bad words to make me sick". Denies SI, HI, VH, and verbally contracts for safety. Patient reports he would like a pass to go to church and would like to walk to [employer] to get some stuff he needs. Patient reports headache.    A: It was explained by this RN to patient that he can not leave the unit. Patient remains on 1:1 for safety. Scheduled medications administered per MD order. PRN tylenol  administered. Support provided. Patient educated on safety on the unit and medications. Routine safety checks every 15 minutes. Patient stated understanding to tell nurse about any new physical symptoms. Patient understands to tell staff of any needs.     R: No adverse drug reactions noted. Patient remains safe at this time and will continue to monitor.    01/18/24 0900  Psych Admission Type (Psych Patients Only)  Admission Status Involuntary  Psychosocial Assessment  Patient Complaints Anxiety  Eye Contact Brief  Facial Expression Anxious  Affect Apathetic  Speech Logical/coherent  Interaction Assertive  Motor Activity Restless  Appearance/Hygiene Unremarkable  Behavior Characteristics Anxious;Restless  Mood Preoccupied  Thought Process  Coherency WDL  Content Preoccupation  Delusions None reported or observed  Perception Hallucinations  Hallucination Auditory  Judgment Limited  Confusion None  Danger to Self  Current suicidal ideation? Denies  Agreement Not to Harm Self Yes  Description of Agreement verbal  Danger to Others  Danger to Others None reported or observed

## 2024-01-18 NOTE — Plan of Care (Signed)
   Problem: Education: Goal: Knowledge of [facility] Education information/materials will improve Outcome: Progressing Goal: Emotional status will improve Outcome: Progressing Goal: Mental status will improve Outcome: Progressing Goal: Verbalization of understanding the information provided will improve Outcome: Progressing

## 2024-01-18 NOTE — Progress Notes (Signed)
 1:1 Progress Note   Patient is observed laying in bed. Sitter within eye sight of patient.    Sitter remains for safety. 15 minute checks continued for safety.    Patient safe on the unit.

## 2024-01-18 NOTE — Plan of Care (Signed)
   Problem: Education: Goal: Emotional status will improve Outcome: Progressing Goal: Mental status will improve Outcome: Progressing   Problem: Safety: Goal: Periods of time without injury will increase Outcome: Progressing

## 2024-01-18 NOTE — Plan of Care (Signed)

## 2024-01-18 NOTE — Progress Notes (Signed)
   01/18/24 0000  Psych Admission Type (Psych Patients Only)  Admission Status Involuntary  Psychosocial Assessment  Patient Complaints Anxiety  Eye Contact Brief  Facial Expression Anxious  Affect Apathetic  Speech Logical/coherent  Interaction Assertive  Motor Activity Restless;Fidgety  Appearance/Hygiene Unremarkable  Behavior Characteristics Anxious;Restless  Mood Preoccupied  Thought Process  Coherency WDL  Content Preoccupation  Delusions None reported or observed  Perception Hallucinations  Hallucination Auditory  Judgment Limited  Confusion None  Danger to Self  Current suicidal ideation? Denies  Agreement Not to Harm Self Yes  Description of Agreement verbal  Danger to Others  Danger to Others None reported or observed

## 2024-01-18 NOTE — Progress Notes (Signed)
 1:1 Progress Note   Patient is observed in his room. Sitter within eye sight of patient.    Sitter remains for safety. 15 minute checks continued for safety.    Patient safe on the unit.

## 2024-01-18 NOTE — Group Note (Addendum)
 Recreation Therapy Group Note   Group Topic:Problem Solving  Group Date: 01/18/2024 Start Time: 0915 End Time: 1120 Facilitators: Giorgi Debruin-McCall, LRT,CTRS Location: [address] Dayroom   Group Topic: Communication, Team Building, Problem Solving  Goal Area(s) Addresses:  Patient will effectively respect peers and community space.  Patient will identify skill used to make activity successful.    Intervention: Dayroom  Activity: Patients have access to the community area to watch tv or just to hang out.   Education: Respect for others and the space   Education Outcome: Acknowledges understanding/In group clarification offered/Needs additional education.   Clinical Observations/Feedback: Due to acuity and lack of staffing, LRT did not hold group. LRT assisted with dayroom making sure patients had access to community area. LRT also assisted with making sure patients completed daily inventory sheets.   Aicha Clingenpeel-McCall, LRT,CTRS 01/18/2024 1:24 PM

## 2024-01-19 ENCOUNTER — Encounter (HOSPITAL_COMMUNITY): Payer: Self-pay

## 2024-01-19 DIAGNOSIS — F250 Schizoaffective disorder, bipolar type: Secondary | ICD-10-CM | POA: Diagnosis not present

## 2024-01-19 MED ORDER — DIVALPROEX SODIUM 250 MG PO DR TAB
750.0000 mg | DELAYED_RELEASE_TABLET | Freq: Two times a day (BID) | ORAL | Status: DC
  Administered 2024-01-19 – 2024-01-23 (×8): 750 mg via ORAL
  Filled 2024-01-19 (×14): qty 3

## 2024-01-19 NOTE — [facility] Group Notes (Signed)
 Spirituality group facilitated by Kathleen Argue, BCC.  Group Description: Group focused on topic of hope. Patients participated in facilitated discussion around topic, connecting with one another around experiences and definitions for hope. Group members engaged with visual explorer photos, reflecting on what hope looks like for them today. Group engaged in discussion around how their definitions of hope are present today in hospital.  Modalities: Psycho-social ed, Adlerian, Narrative, MI  Patient Progress: Did not attend.

## 2024-01-19 NOTE — Progress Notes (Signed)
 Pt remains on 1:1 observation. Pt is calm and cooperative at this time, present with staff in the hallway. Respirations even and unlabored.

## 2024-01-19 NOTE — BH IP Treatment Plan (Signed)
 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)   Current Medications:  Current Facility-Administered Medications  Medication Dose Route Frequency Provider Last Rate Last Admin   acetaminophen  (TYLENOL ) tablet 650 mg  650 mg Oral Q6H PRN Onuoha, Josephine C, NP   650 mg at 01/18/24 0948   alum & mag hydroxide-simeth (MAALOX/MYLANTA) 200-200-20 MG/5ML suspension 30 mL  30 mL Oral Q4H PRN Onuoha, Josephine C, NP       benztropine  (COGENTIN ) tablet 1 mg  1 mg Oral Q12H Zouev, Dmitri, MD   1 mg at 01/19/24 0825   haloperidol  (HALDOL ) tablet 5 mg  5 mg Oral TID PRN Onuoha, Josephine C, NP   5 mg at 01/16/24 2130   And   diphenhydrAMINE  (BENADRYL ) capsule 50 mg  50 mg Oral TID PRN Onuoha, Josephine C, NP   50 mg at 01/16/24 2130   haloperidol  lactate (HALDOL ) injection 5 mg  5 mg Intramuscular TID PRN Onuoha, Josephine C, NP   5 mg at 01/17/24 1539   And   diphenhydrAMINE  (BENADRYL ) injection 50 mg  50 mg Intramuscular TID PRN Onuoha, Josephine C, NP       And   LORazepam  (ATIVAN ) injection 2 mg  2 mg Intramuscular TID PRN Onuoha, Josephine C, NP       haloperidol  lactate (HALDOL ) injection 10 mg  10 mg Intramuscular TID PRN Onuoha, Josephine C, NP       And   diphenhydrAMINE  (BENADRYL ) injection 50 mg  50 mg Intramuscular TID PRN Onuoha, Josephine C, NP   50 mg at 01/17/24 1540   And   LORazepam  (ATIVAN ) injection 2 mg  2 mg Intramuscular TID PRN Onuoha, Josephine C, NP   2 mg at 01/17/24 1539   divalproex  (DEPAKOTE ) DR tablet 750 mg  750 mg Oral Q12H Attiah, Nadir, MD       hydrOXYzine  (ATARAX ) tablet 25 mg  25 mg Oral  TID PRN Bobbitt, Shalon E, NP   25 mg at 01/18/24 1654   LORazepam  (ATIVAN ) tablet 1 mg  1 mg Oral TID AC Attiah, Nadir, MD   1 mg at 01/19/24 1306   mirtazapine (REMERON SOL-TAB) disintegrating tablet 15 mg  15 mg Oral QHS Onuoha, Josephine C, NP   15 mg at 01/18/24 2012   nicotine  (NICODERM CQ  - dosed in mg/24 hours) patch 14 mg  14 mg Transdermal Daily Ntuen, Tina C, FNP   14 mg at 01/19/24 0825   nicotine  polacrilex (NICORETTE ) gum 2 mg  2 mg Oral PRN Zouev, Dmitri, MD   2 mg at 01/16/24 1706   risperiDONE  (RISPERDAL ) tablet 3 mg  3 mg Oral Q12H Attiah, Nadir, MD   3 mg at 01/19/24 0825   traZODone  (DESYREL ) tablet 200 mg  200 mg Oral QHS Attiah, Nadir, MD   200 mg at 01/18/24 2010   PTA Medications: Medications Prior to Admission  Medication Sig Dispense Refill Last Dose/Taking   amLODipine  (NORVASC ) 5 MG tablet Take 1 tablet (5 mg total) by mouth daily. (Patient not taking: Reported on 01/12/2024) 30 tablet 0    amphetamine -dextroamphetamine  (ADDERALL) 20 MG tablet Take 20 mg by mouth 2 (two) times daily.  benztropine  (COGENTIN ) 0.5 MG tablet Take 1 tablet (0.5 mg total) by mouth daily. (Patient not taking: Reported on 01/12/2024) 30 tablet 1    divalproex  (DEPAKOTE  ER) 500 MG 24 hr tablet Take 2 tablets (1,000 mg total) by mouth at bedtime. (Patient not taking: Reported on 01/12/2024) 60 tablet 1    fluPHENAZine  (PROLIXIN ) 10 MG tablet Take 1 tablet (10 mg total) by mouth 2 (two) times daily. (Patient not taking: Reported on 01/12/2024) 60 tablet 1    hydrOXYzine  (ATARAX ) 50 MG tablet Take 1 tablet (50 mg total) by mouth 3 (three) times daily as needed for anxiety. (Patient not taking: Reported on 01/12/2024) 75 tablet 1    nicotine  polacrilex (NICORETTE ) 2 MG gum Take 1 each (2 mg total) by mouth as needed for smoking cessation. (Patient not taking: Reported on 01/12/2024) 100 tablet 0    propranolol  (INDERAL ) 10 MG tablet Take 1 tablet (10 mg total) by mouth 3 (three) times daily. (Patient not  taking: Reported on 01/12/2024) 90 tablet 1    traZODone  (DESYREL ) 100 MG tablet Take 1 tablet (100 mg total) by mouth at bedtime. (Patient not taking: Reported on 01/12/2024) 30 tablet 1     Patient Stressors: Financial difficulties   Medication change or noncompliance    Patient Strengths: Capable of independent living  General fund of knowledge   Treatment Modalities: Medication Management, Group therapy, Case management,  1 to 1 session with clinician, Psychoeducation, Recreational therapy.   Physician Treatment Plan for Primary Diagnosis: Schizoaffective disorder, bipolar type (HCC) Long Term Goal(s): Improvement in symptoms so as ready for discharge   Short Term Goals: Ability to identify changes in lifestyle to reduce recurrence of condition will improve Ability to verbalize feelings will improve Ability to disclose and discuss suicidal ideas Ability to demonstrate self-control will improve Ability to identify and develop effective coping behaviors will improve Ability to maintain clinical measurements within normal limits will improve Compliance with prescribed medications will improve Ability to identify triggers associated with substance abuse/mental health issues will improve  Medication Management: Evaluate patient's response, side effects, and tolerance of medication regimen.  Therapeutic Interventions: 1 to 1 sessions, Unit Group sessions and Medication administration.  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)  Long Term Goal(s): Improvement in symptoms so as ready for discharge   Short Term Goals: Ability to identify changes in lifestyle to reduce recurrence of condition will improve Ability to verbalize feelings will  improve Ability to disclose and discuss suicidal ideas Ability to demonstrate self-control will improve Ability to identify and develop effective coping behaviors will improve Ability to maintain clinical measurements within normal limits will improve Compliance with prescribed medications will improve Ability to identify triggers associated with substance abuse/mental health issues will improve     Medication Management: Evaluate patient's response, side effects, and tolerance of medication regimen.  Therapeutic Interventions: 1 to 1 sessions, Unit Group sessions and Medication administration.  Evaluation of Outcomes: Progressing   RN Treatment Plan for Primary Diagnosis: Schizoaffective disorder, bipolar type (HCC) Long Term Goal(s): Knowledge of disease and therapeutic regimen to maintain health will improve  Short Term Goals: Ability to remain free from injury will improve, Ability to verbalize frustration and anger appropriately will improve, Ability to verbalize feelings will improve, and Ability to disclose and discuss suicidal ideas  Medication Management: RN will administer medications as ordered by provider, will assess and evaluate patient's response and provide education to patient for prescribed medication. RN will report any adverse and/or side effects to prescribing provider.  Therapeutic Interventions: 1 on 1 counseling sessions, Psychoeducation, Medication administration, Evaluate responses to treatment, Monitor vital signs and CBGs as ordered, Perform/monitor CIWA, COWS, AIMS and Fall Risk screenings as ordered, Perform wound care treatments as ordered.  Evaluation of Outcomes: Progressing   [name] Treatment Plan for Primary Diagnosis: Schizoaffective disorder, bipolar type (HCC) Long Term Goal(s): Safe transition to appropriate next level of care at discharge, Engage patient in therapeutic group addressing interpersonal concerns.  Short Term Goals: Engage patient in  aftercare planning with referrals and resources, Increase ability to appropriately verbalize feelings, Facilitate acceptance of mental health diagnosis and concerns, and Identify triggers associated with mental health/substance abuse issues  Therapeutic Interventions: Assess for all discharge needs, 1 to 1 time with Social worker, Explore available resources and support systems, Assess for adequacy in community support network, Educate family and significant other(s) on suicide prevention, Complete Psychosocial Assessment, Interpersonal group therapy.  Evaluation of Outcomes: Progressing   Progress in Treatment: Attending groups: No. Participating in groups: No. Taking medication as prescribed: Yes. Toleration medication: Yes. Family/Significant other contact made:  Pt declined consents Patient understands diagnosis: Yes. Discussing patient identified problems/goals with staff: Yes. Medical problems stabilized or resolved: Yes. Denies suicidal/homicidal ideation: Yes. Issues/concerns per patient self-inventory: No.   New problem(s) identified: No, Describe:  None   New Short Term/Long Term Goal(s): medication stabilization, elimination of SI thoughts, development of comprehensive mental wellness plan.    Patient Goals:  "The voices are really bothering me"   Discharge Plan or Barriers: Patient recently admitted. CSW will continue to follow and assess for appropriate referrals and possible discharge planning.    Reason for Continuation of Hospitalization: Aggression Anxiety Delusions  Medication stabilization Other; describe Mood stabilization, discharge planning   Estimated Length of Stay:  4 - 6 days  Last 3 [place] Suicide Severity Risk Score: Flowsheet Row Admission (Current) [facility] from 01/11/2024 in [facility] at [facility] Clinical Support from 11/14/2023 in [facility]  C-SSRS RISK CATEGORY No Risk No Risk No Risk       Last [facility] 2/9 Scores:    11/14/2023    9:48 AM 11/07/2023    9:50 AM 04/20/2023    4:08 PM  Depression screen PHQ 2/9  Decreased Interest 0 1 1  Down, Depressed, Hopeless 0 0 0  PHQ - 2 Score 0 1 1  Altered sleeping   1  Tired, decreased energy   0  Change in appetite   0  Feeling bad or failure about yourself    0  Trouble concentrating   3  Moving slowly or fidgety/restless   3  Suicidal thoughts   0  PHQ-9 Score   8  Difficult doing work/chores   Not difficult at all    Scribe for Treatment Team: Keyion Knack O Audine Mangione, LCSWA 01/19/2024 3:30 PM

## 2024-01-19 NOTE — Progress Notes (Signed)
 1:1 Progress Note   Patient is observed in his room. Pt was med compliant and continued to endorse AH. Sitter within eye sight of patient.    Sitter remains for safety. 15 minute checks continued for safety.    Patient safe on the unit.

## 2024-01-19 NOTE — Progress Notes (Signed)
 1:1 Progress Note   Patient is observed in his room. Sitter within eye sight of patient.    Sitter remains for safety. 15 minute checks continued for safety.    Patient safe on the unit.

## 2024-01-19 NOTE — Progress Notes (Signed)
 [facility] MD Progress Note  01/19/2024 11:12 AM William Romero  MRN:  [id number]  Per chart review patient is compliant with medications on the unit.  As needed Atarax  for anxiety was used 3 times on 5/12, and once on 5/15 in the afternoon.  No as needed agitation protocol used since 5/14 at 3 PM.  Patient continues to be on one-to-one precaution with one-to-one staff reporting patient with no incidents of agitation or aggression or overt psychosis except of reporting auditory hallucinations and having occasions of talking to himself as reported before.    Subjective:   Patient was assessed in his room today, he presents more linear and organized than the past few days.  He continues to report hearing voices but in general he reports him to be better on the medication" describes voices to be less frequent and less annoying but continues to report voices to say dishonesty stuff and he has to correct them, he also describes voices to be less loud he scales voices 5-6 out of 1010 being the worst notes last time he heard any voices was last night and none this morning.  He does report feeling much less confused last night and today, denies SI HI or VH.  He reports better sleep last night and feeling less restless.  He reports fair appetite.    Primarily patient tells me that he will call his mother today but later he stops me in the hallway telling me that he does not plan to call his mother and does not want her involved in his care given he does not feel his family is supportive.  In both occasions he seems to present more linear than the past few days which is quite an improvement.  He denies side effects to medications, does not appear sleepy, does not display any signs consistent with EPS or TD.     Chart review indicates history of response to Prolixin  orally during previous hospitalization.  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 Augusta Blizzard 04/13/2023, poor followup Current therapist: none. Previous reported psychiatric diagnoses:  ?ADHD, schizoaffective disorder bipolar type, GAD Current psychiatric medications: none. Psychiatric medication history/compliance: Per chart review, poor with the exception of Adderall.  Full med trials include: adderall, bupropion, duloxetine , haloperidol , hydroxyzine  (ineffective), lorazepam , olanzapine , ziprasidone , paliperidone , risperidone ,  Psychiatric hospitalization(s): Approximately 6 stays over the past from 2012-2019.  Most recently, patient had 3 separate stays at [facility] in 2024, 6/11 - 6/19 and 6/22 - 7/1, and on 10/2023 Psychotherapy history: None Neuromodulation history: none. History of suicide (obtained from HPI): Reported 1 attempted OD in distant past, presently denied History of homicide or aggression (obtained in HPI): none.  Past Medical History:  Past Medical History:  Diagnosis Date   Paranoid schizophrenia (HCC) 10/03/2023   Schizophrenia (HCC)     Past Surgical History:  Procedure Laterality Date   APPENDECTOMY     Family History: History reviewed. No pertinent family history. Family Psychiatric  History:  Denies family psychiatric history  Social History:  Social History   Substance and Sexual Activity  Alcohol Use Not Currently   Alcohol/week: 5.0 standard drinks of alcohol   Types: 5 Cans of beer per week  Social History   Substance and Sexual Activity  Drug Use No    Social History   Socioeconomic History   Marital status: Single    Spouse name: Not on file   Number of children: Not on file   Years of education: Not on file   Highest education level: Not  on file  Occupational History   Not on file  Tobacco Use   Smoking status: Former    Current packs/day: 0.50    Types: Cigarettes   Smokeless tobacco: Never  Vaping Use   Vaping status: Never Used  Substance and Sexual Activity   Alcohol use: Not Currently    Alcohol/week: 5.0 standard drinks of alcohol    Types: 5 Cans of beer per week   Drug use: No   Sexual activity: Not Currently  Other Topics Concern   Not on file  Social History Narrative   Not on file   Social Drivers of Health   Financial Resource Strain: Not on file  Food Insecurity: No Food Insecurity (01/14/2024)   Hunger Vital Sign    Worried About Running Out of Food in the Last Year: Never true    Ran Out of Food in the Last Year: Never true  Transportation Needs: No Transportation Needs (01/14/2024)   PRAPARE - Administrator, Civil Service (Medical): No    Lack of Transportation (Non-Medical): No  Physical Activity: Not on file  Stress: Not on file  Social Connections: Not on file   Additional Social History:                         Sleep: Fair  Appetite:  Fair  Current Medications: Current Facility-Administered Medications  Medication Dose Route Frequency Provider Last Rate Last Admin   acetaminophen  (TYLENOL ) tablet 650 mg  650 mg Oral Q6H PRN Onuoha, Josephine C, NP   650 mg at 01/18/24 0948   alum & mag hydroxide-simeth (MAALOX/MYLANTA) 200-200-20 MG/5ML suspension 30 mL  30 mL Oral Q4H PRN Onuoha, Josephine C, NP       benztropine  (COGENTIN ) tablet 1 mg  1 mg Oral Q12H Zouev, Dmitri, MD   1 mg at 01/19/24 0825   haloperidol  (HALDOL ) tablet 5 mg  5 mg Oral TID PRN Onuoha, Josephine C, NP   5 mg at 01/16/24 2130   And   diphenhydrAMINE  (BENADRYL ) capsule 50 mg  50 mg Oral TID PRN Onuoha, Josephine C, NP   50 mg at 01/16/24 2130   haloperidol  lactate (HALDOL ) injection 5 mg  5 mg Intramuscular TID PRN Onuoha, Josephine C, NP   5 mg at 01/17/24 1539   And   diphenhydrAMINE   (BENADRYL ) injection 50 mg  50 mg Intramuscular TID PRN Onuoha, Josephine C, NP       And   LORazepam  (ATIVAN ) injection 2 mg  2 mg Intramuscular TID PRN Onuoha, Josephine C, NP       haloperidol  lactate (HALDOL ) injection 10 mg  10 mg Intramuscular TID PRN Onuoha, Josephine C, NP       And   diphenhydrAMINE  (BENADRYL ) injection 50 mg  50 mg Intramuscular TID PRN Onuoha, Josephine C, NP   50 mg at 01/17/24 1540   And   LORazepam  (ATIVAN ) injection 2 mg  2 mg Intramuscular TID PRN Onuoha, Josephine C, NP   2 mg at 01/17/24 1539   divalproex  (DEPAKOTE  ER) 24 hr tablet 500 mg  500 mg Oral Daily Onuoha, Josephine C,  NP   500 mg at 01/19/24 0825   divalproex  (DEPAKOTE  ER) 24 hr tablet 500 mg  500 mg Oral QHS Zouev, Dmitri, MD   500 mg at 01/18/24 2011   hydrOXYzine  (ATARAX ) tablet 25 mg  25 mg Oral TID PRN Bobbitt, Shalon E, NP   25 mg at 01/18/24 1654   LORazepam  (ATIVAN ) tablet 1 mg  1 mg Oral TID AC Chloie Loney, MD   1 mg at 01/19/24 0825   mirtazapine (REMERON SOL-TAB) disintegrating tablet 15 mg  15 mg Oral QHS Onuoha, Josephine C, NP   15 mg at 01/18/24 2012   nicotine  (NICODERM CQ  - dosed in mg/24 hours) patch 14 mg  14 mg Transdermal Daily Ntuen, Tina C, FNP   14 mg at 01/19/24 0825   nicotine  polacrilex (NICORETTE ) gum 2 mg  2 mg Oral PRN Zouev, Dmitri, MD   2 mg at 01/16/24 1706   risperiDONE  (RISPERDAL ) tablet 3 mg  3 mg Oral Q12H Diogo Anne, MD   3 mg at 01/19/24 0825   traZODone  (DESYREL ) tablet 200 mg  200 mg Oral QHS Savita Runner, MD   200 mg at 01/18/24 2010    Lab Results:  Results for orders placed or performed during the hospital encounter of 01/13/24 (from the past 48 hours)  Valproic acid level     Status: Abnormal   Collection Time: 01/18/24  6:52 AM  Result Value Ref Range   Valproic Acid Lvl 47 (L) 50 - 100 ug/mL    Comment: Performed at [facility], [address] [place], [place] [address]     Blood Alcohol level:  Lab Results  Component  Value Date   [facility] <15 01/11/2024   ETH <10 10/03/2023    Metabolic Disorder Labs: Lab Results  Component Value Date   HGBA1C 5.2 01/15/2024   MPG 103 01/15/2024   MPG 99.67 10/03/2023   No results found for: "PROLACTIN" Lab Results  Component Value Date   CHOL 168 01/15/2024   TRIG 172 (H) 01/15/2024   HDL 42 01/15/2024   CHOLHDL 4.0 01/15/2024   VLDL 34 01/15/2024   LDLCALC 92 01/15/2024   LDLCALC 98 10/03/2023    Physical Findings: AIMS:  , ,  ,  ,    CIWA:    COWS:     Musculoskeletal: Strength & Muscle Tone: within normal limits Gait & Station: normal Patient leans: N/A  Psychiatric Specialty Exam:  Presentation  General Appearance:  Bizarre; Disheveled  Eye Contact: Poor  Speech: Blocked  Speech Volume: Increased  Handedness: Right   Mood and Affect  Mood: Anxious; Dysphoric; Irritable; Labile  Affect: Congruent; Labile   Thought Process  Thought Processes: Disorganized  Descriptions of Associations:Tangential  Orientation:Full (Time, Place and Person)  Thought Content:Delusions; Illogical; Paranoid Ideation; Perseveration  History of Schizophrenia/Schizoaffective disorder:Yes  Duration of Psychotic Symptoms:Greater than six months  Hallucinations:No data recorded  Ideas of Reference:Percusatory; Paranoia; Delusions  Suicidal Thoughts:No data recorded  Homicidal Thoughts:No data recorded   Sensorium  Memory: Immediate Fair; Remote Poor  Judgment: Poor  Insight: Poor   Executive Functions  Concentration: Poor  Attention Span: Poor  Recall: [employer] of Knowledge: Fair  Language: Fair   Psychomotor Activity  Psychomotor Activity: No data recorded   Assets  Assets: Communication Skills; Desire for Improvement; Physical Health   Sleep  Sleep: No data recorded    Physical Exam: Physical exam: Please see exam on admit note. General: Well developed, well nourished.  Pupils: Normal at  3mm Respiratory: Breathing is unlabored.  Cardiovascular: No edema.  Language: No anomia, no aphasia Muscle strength and tone-pt moving all extremities.  Gait not assessed as pt remained in bed.  Neuro: Facial muscles are symmetric. Pt without tremor, no evidence of hyperarousal.  Review of Systems  Constitutional: Negative.   HENT: Negative.    Eyes: Negative.   Respiratory: Negative.    Cardiovascular: Negative.   Gastrointestinal: Negative.   Genitourinary: Negative.   Musculoskeletal: Negative.   Skin: Negative.   Neurological: Negative.   Endo/Heme/Allergies: Negative.   Psychiatric/Behavioral:  Positive for hallucinations and substance abuse. The patient is nervous/anxious.    Blood pressure 125/87, pulse (!) 120, temperature 98.2 F (36.8 C), temperature source Oral, resp. rate 18, height 6\' 2"  (1.88 m), weight 93.9 kg, SpO2 98%. Body mass index is 26.58 kg/m.   Treatment Plan Summary: Daily contact with patient to assess and evaluate symptoms and progress in treatment, Medication management, and Plan as noted below  5/16 patient seems to have some improvement in his psychosis with current treatment regimen since titrated risperidone  from 2 to 3 mg twice daily, will follow.   Treatment Plan Summary: Daily contact with patient to assess and evaluate symptoms and progress in treatment, Medication management, and Plan     Observation Level/Precautions:  15 minute checks  Laboratory:  CBC Chemistry Profile Folic Acid HbAIC HCG UDS UA Vitamin B-12  Psychotherapy:  denies  Medications:  olanzapine , Cogentin   Consultations:    Discharge Concerns:  stabilization of psychosis and LAI  Estimated LOS: 5-7 days  Other:      Safety and Monitoring: INVOLUTARILY (completed second exam) admission to inpatient psychiatric unit for safety, stabilization and treatment Daily contact with patient to assess and evaluate symptoms and progress in treatment Patient's case to be  discussed in multi-disciplinary team meeting Observation Level : q15 minute checks Vital signs: q12 hours Precautions: suicide, elopement, and assault   2. Psychiatric Problems #schizoaffective disorder bipolar type - Titrate Depakote  from 500 to 750 mg twice daily and obtain another Depakote  level on 5/20 Depakote  level low therapeutic 47 on 5/15  -cont cogentin  1 mg BID for EPS prophylaxis - Continue risperidone  3 mg BID, dose was titrated on 5/14 Consider risperidone  LAI if oral risperidone  is effective and well-tolerated to improve long-term compliance and decrease risk of decompensation. If risperidone  is not effective we will switch to oral Prolixin  given history of good response during previous hospitalization. -cont hydroxyzine  50 mg q6h-PRN for anxiety  - Continue Ativan  1 mg TID for 3 more days for high anxiety, irritability and mood lability - Titrate trazodone  to 200 mg at bedtime scheduled for insomnia      3. Medical Management Covid negative CMP: wnl,calcium 8.5 CBC: unremarkable EtOH: <10 UDS: + amphetamine , +THC TSH: ordered A1C:  ordered, pending Lipids: triglycerides elevated otherwise wnl Depakote  level 5/15 low therapeutic 47   #HTN -cont amlodipine  5 mg qdaily   Physician Treatment Plan for Primary Diagnosis: Schizophrenia, unspecified (HCC) Long Term Goal(s): Improvement in symptoms so as ready for discharge   Short Term Goals: Ability to identify changes in lifestyle to reduce recurrence of condition will improve, Ability to verbalize feelings will improve, Ability to disclose and discuss suicidal ideas, Ability to demonstrate self-control will improve, Ability to identify and develop effective coping behaviors will improve, Ability to maintain clinical measurements within normal limits will improve, Compliance with prescribed medications will improve, and Ability to identify triggers associated with substance  abuse/mental health issues will improve    Physician Treatment Plan for Secondary Diagnosis: Principal Problem:   Schizophrenia, unspecified (HCC)       I certify that inpatient services furnished can reasonably be expected to improve the patient's condition.      Lorell Thibodaux Linnie Riches, MD 01/19/2024, 11:12 AM

## 2024-01-19 NOTE — Progress Notes (Signed)
   01/19/24 0825  Psych Admission Type (Psych Patients Only)  Admission Status Involuntary  Psychosocial Assessment  Patient Complaints Anxiety  Eye Contact Brief  Facial Expression Anxious  Affect Preoccupied  Speech Logical/coherent  Interaction Assertive  Motor Activity Restless  Appearance/Hygiene Unremarkable  Behavior Characteristics Cooperative  Mood Anxious  Thought Process  Coherency WDL  Content WDL  Delusions None reported or observed  Perception WDL  Hallucination Auditory (pt reports "hearing a male saying bad words")  Judgment Poor  Confusion None  Danger to Self  Current suicidal ideation? Denies  Agreement Not to Harm Self Yes  Description of Agreement verbal  Danger to Others  Danger to Others None reported or observed

## 2024-01-19 NOTE — [facility] Group Notes (Signed)
 Adult Psychoeducational Group Note  Date:  01/19/2024 Time:  7:01 PM  Group Topic/Focus:  Goals Group:   The focus of this group is to help patients establish daily goals to achieve during treatment and discuss how the patient can incorporate goal setting into their daily lives to aide in recovery. Orientation:   The focus of this group is to educate the patient on the purpose and policies of crisis stabilization and provide a format to answer questions about their admission.  The group details unit policies and expectations of patients while admitted.  Participation Level:  Did Not Attend  Participation Quality:    Affect:    Cognitive:    Insight:   Engagement in Group:    Modes of Intervention:    Additional Comments:    William Romero O 01/19/2024, 7:01 PM

## 2024-01-19 NOTE — Progress Notes (Signed)
 Pt remains on 1:1 observation with Turi, MHT. Pt cooperative and calm. Respirations even and unlabored.

## 2024-01-19 NOTE — Group Note (Signed)
 Date:  01/19/2024 Time:  8:21 PM  Group Topic/Focus:  Wrap-Up Group:   The focus of this group is to help patients review their daily goal of treatment and discuss progress on daily workbooks.    Participation Level:  Did Not Attend   William Romero 01/19/2024, 8:21 PM

## 2024-01-19 NOTE — Progress Notes (Signed)
   01/18/24 2100  Psych Admission Type (Psych Patients Only)  Admission Status Involuntary  Psychosocial Assessment  Patient Complaints Anxiety;Restlessness (Pt stated, "I just want to go home." Pt restless but redirecting self in room. Pt resuming 1:1 observation with staff present for safety. No agitation/irritable behavior exhibited.)  Eye Contact Darting  Facial Expression Anxious  Affect Preoccupied;Constricted  Speech Logical/coherent  Interaction Assertive;Minimal  Surveyor, quantity;Restless  Appearance/Hygiene Unremarkable  Behavior Characteristics Cooperative;Appropriate to situation  Mood Anxious  Thought Process  Coherency WDL  Content Preoccupation  Delusions None reported or observed  Perception Hallucinations  Hallucination Auditory (Pt continues to endorse AH as "bad words, making me sick")  Judgment Limited  Confusion None  Danger to Self  Current suicidal ideation? Denies  Agreement Not to Harm Self Yes  Description of Agreement Verbal  Danger to Others  Danger to Others None reported or observed

## 2024-01-19 NOTE — Group Note (Signed)
 Recreation Therapy Group Note   Group Topic:Problem Solving  Group Date: 01/19/2024 Start Time: 1020 End Time: 1040 Facilitators: Dylana Shaw-McCall, LRT,CTRS Location: [address] Dayroom   Group Topic: Communication, Team Building, Problem Solving  Goal Area(s) Addresses:  Patient will effectively work with peer towards shared goal.  Patient will identify skills used to make activity successful.  Patient will identify how skills used during activity can be used to reach post d/c goals.   Intervention: STEM Activity  Activity: Stage manager. In teams of 3-5, patients were given 12 plastic drinking straws and an equal length of masking tape. Using the materials provided, patients were asked to build a landing pad to catch a golf ball dropped from approximately 5 feet in the air. All materials were required to be used by the team in their design. LRT facilitated post-activity discussion.  Education: Pharmacist, community, Scientist, physiological, Discharge Planning   Education Outcome: Acknowledges education/In group clarification offered/Needs additional education.    Affect/Mood: N/A   Participation Level: Did not attend    Clinical Observations/Individualized Feedback:     Plan: Continue to engage patient in RT group sessions 2-3x/week.   Loreta Blouch-McCall, LRT,CTRS 01/19/2024 11:20 AM

## 2024-01-20 DIAGNOSIS — F250 Schizoaffective disorder, bipolar type: Secondary | ICD-10-CM | POA: Diagnosis not present

## 2024-01-20 MED ORDER — RISPERIDONE 2 MG PO TABS
2.0000 mg | ORAL_TABLET | Freq: Two times a day (BID) | ORAL | Status: DC
  Administered 2024-01-20 – 2024-01-21 (×2): 2 mg via ORAL
  Filled 2024-01-20 (×6): qty 1

## 2024-01-20 MED ORDER — FLUPHENAZINE HCL 2.5 MG PO TABS
2.5000 mg | ORAL_TABLET | Freq: Two times a day (BID) | ORAL | Status: DC
  Administered 2024-01-20 – 2024-01-21 (×3): 2.5 mg via ORAL
  Filled 2024-01-20 (×7): qty 1

## 2024-01-20 NOTE — Progress Notes (Addendum)
 [facility]  1:1 Observation Documentation   Patient's Behavior:  Pt is calm and cooperative. The patient is able to follow simple commands.   Patient's Condition:  He remains confused to situation but is oriented to person, and place. Initially, he seemed tired and sleepy, but was able to fully wake up to get a snack and take bedtime medications.   Patient's Conversation:  During the shift, he was able to vocalize his needs and have a simple conversation about how his day was.   Henya Aguallo

## 2024-01-20 NOTE — Plan of Care (Signed)
  Problem: Coping: Goal: Ability to verbalize frustrations and anger appropriately will improve Outcome: Progressing   Problem: Safety: Goal: Periods of time without injury will increase Outcome: Progressing   Problem: Activity: Goal: Interest or engagement in activities will improve Outcome: Not Progressing   

## 2024-01-20 NOTE — Progress Notes (Signed)
 [facility]  1:1 Observation Documentation   Patient's Behavior:  Pt is calm and resting.   Patient's Condition:  No acute distress noted. Sitter remains at bedside.   Patient's Conversation:  Patient slept asleep.    Keriann Rankin 01/20/2024,

## 2024-01-20 NOTE — Progress Notes (Signed)
 Pt HR elevated. Pt is anxious and complaining of increase auditory hallucinations. Scheduled morning medications given. Pt denies any other physical complaints. Gait is steady and denies any dizziness. He is encouraged to to drink fluids. 1:1 sitter remains at bedside.    01/20/24 0706  Vital Signs  Temp 97.9 F (36.6 C)  Temp Source Oral  Pulse Rate (!) 126  Pulse Rate Source Monitor  BP 115/80  BP Location Left Arm  BP Method Automatic  Patient Position (if appropriate) Standing  Oxygen Therapy  SpO2 99 %

## 2024-01-20 NOTE — Progress Notes (Signed)
 Patient needs verbal redirection numerous times from wandering into patients room, appears confused  01/20/24 1300  Psych Admission Type (Psych Patients Only)  Admission Status Involuntary  Psychosocial Assessment  Patient Complaints Confusion  Eye Contact Brief  Facial Expression Flat  Affect Preoccupied  Speech Slow  Interaction Minimal  Motor Activity Slow  Appearance/Hygiene Unremarkable  Behavior Characteristics Appropriate to situation  Mood Preoccupied  Thought Process  Coherency Disorganized  Content Preoccupation  Delusions None reported or observed  Perception Hallucinations  Hallucination Auditory  Judgment Poor  Confusion Mild  Danger to Self  Current suicidal ideation? Denies  Danger to Others  Danger to Others None reported or observed

## 2024-01-20 NOTE — Progress Notes (Signed)
 Patient appears confused, disorganized needing verbal direction and numerous prompts.

## 2024-01-20 NOTE — Progress Notes (Signed)
 [facility] MD Progress Note  01/20/2024 11:32 AM William Romero  MRN:  [id number]  Per chart review patient is compliant with medications on the unit.  As needed Atarax  for anxiety was used 3 times on 5/12, and once on 5/15 in the afternoon.  No as needed agitation protocol used since 5/14 at 3 PM.  Patient continues to be on one-to-one precaution with one-to-one staff reporting patient, staff report yesterday patient had multiple incidents getting out of his room attempting to get in other patient's room hard to redirect appearing disorganized and responding to stimuli.  Subjective:   Patient was assessed in his room today, he does present linear this morning reporting improved hallucinations less frequent and less annoying but still there, he reports better sleep and appetite and continues to deny side effect of medications and agrees to comply, he does not appear sleepy and does not display any signs consistent with EPS or TD.  He reports he is able to think more clear.    Later during evaluation today patient tells me that he is thinking in regards to contacting his mom and will let me know soon.   Chart review indicates history of response to Prolixin  orally during previous hospitalization.  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 Augusta Blizzard 04/13/2023, poor followup Current therapist: none. Previous reported psychiatric diagnoses:  ?ADHD, schizoaffective disorder bipolar type, GAD Current psychiatric medications: none. Psychiatric medication history/compliance: Per chart review, poor with the exception of Adderall.  Full med trials include: adderall,  bupropion, duloxetine , haloperidol , hydroxyzine  (ineffective), lorazepam , olanzapine , ziprasidone , paliperidone , risperidone ,  Psychiatric hospitalization(s): Approximately 6 stays over the past from 2012-2019.  Most recently, patient had 3 separate stays at [facility] in 2024, 6/11 - 6/19 and 6/22 - 7/1, and on 10/2023 Psychotherapy history: None Neuromodulation history: none. History of suicide (obtained from HPI): Reported 1 attempted OD in distant past, presently denied History of homicide or aggression (obtained in HPI): none.  Past Medical History:  Past Medical History:  Diagnosis Date   Paranoid schizophrenia (HCC) 10/03/2023   Schizophrenia (HCC)     Past Surgical History:  Procedure Laterality Date   APPENDECTOMY     Family History: History reviewed. No pertinent family history. Family Psychiatric  History:  Denies family psychiatric history  Social History:  Social History   Substance and Sexual Activity  Alcohol Use Not Currently   Alcohol/week: 5.0 standard drinks of alcohol   Types: 5 Cans of beer per week     Social History   Substance and Sexual Activity  Drug Use No    Social History   Socioeconomic History   Marital status: Single    Spouse name: Not on file   Number of children: Not on file   Years of education: Not on file   Highest education level: Not on file  Occupational History   Not on file  Tobacco Use   Smoking status: Former    Current packs/day: 0.50    Types: Cigarettes   Smokeless tobacco: Never  Vaping Use   Vaping status: Never Used  Substance and Sexual Activity   Alcohol use: Not Currently    Alcohol/week: 5.0 standard drinks of alcohol    Types: 5  Cans of beer per week   Drug use: No   Sexual activity: Not Currently  Other Topics Concern   Not on file  Social History Narrative   Not on file   Social Drivers of Health   Financial Resource Strain: Not on file  Food Insecurity: No Food Insecurity (01/14/2024)   Hunger Vital  Sign    Worried About Running Out of Food in the Last Year: Never true    Ran Out of Food in the Last Year: Never true  Transportation Needs: No Transportation Needs (01/14/2024)   PRAPARE - Administrator, Civil Service (Medical): No    Lack of Transportation (Non-Medical): No  Physical Activity: Not on file  Stress: Not on file  Social Connections: Not on file   Additional Social History:                         Sleep: Fair  Appetite:  Fair  Current Medications: Current Facility-Administered Medications  Medication Dose Route Frequency Provider Last Rate Last Admin   acetaminophen  (TYLENOL ) tablet 650 mg  650 mg Oral Q6H PRN Onuoha, Josephine C, NP   650 mg at 01/18/24 0948   alum & mag hydroxide-simeth (MAALOX/MYLANTA) 200-200-20 MG/5ML suspension 30 mL  30 mL Oral Q4H PRN Onuoha, Josephine C, NP       benztropine  (COGENTIN ) tablet 1 mg  1 mg Oral Q12H Zouev, Dmitri, MD   1 mg at 01/20/24 0654   haloperidol  (HALDOL ) tablet 5 mg  5 mg Oral TID PRN Onuoha, Josephine C, NP   5 mg at 01/16/24 2130   And   diphenhydrAMINE  (BENADRYL ) capsule 50 mg  50 mg Oral TID PRN Onuoha, Josephine C, NP   50 mg at 01/16/24 2130   haloperidol  lactate (HALDOL ) injection 5 mg  5 mg Intramuscular TID PRN Onuoha, Josephine C, NP   5 mg at 01/17/24 1539   And   diphenhydrAMINE  (BENADRYL ) injection 50 mg  50 mg Intramuscular TID PRN Onuoha, Josephine C, NP       And   LORazepam  (ATIVAN ) injection 2 mg  2 mg Intramuscular TID PRN Onuoha, Josephine C, NP       haloperidol  lactate (HALDOL ) injection 10 mg  10 mg Intramuscular TID PRN Onuoha, Josephine C, NP       And   diphenhydrAMINE  (BENADRYL ) injection 50 mg  50 mg Intramuscular TID PRN Onuoha, Josephine C, NP   50 mg at 01/17/24 1540   And   LORazepam  (ATIVAN ) injection 2 mg  2 mg Intramuscular TID PRN Onuoha, Josephine C, NP   2 mg at 01/17/24 1539   divalproex  (DEPAKOTE ) DR tablet 750 mg  750 mg Oral Q12H Railee Bonillas, MD   750  mg at 01/20/24 0654   fluPHENAZine  (PROLIXIN ) tablet 2.5 mg  2.5 mg Oral BID Linnie Riches, Taeko Schaffer, MD       hydrOXYzine  (ATARAX ) tablet 25 mg  25 mg Oral TID PRN Bobbitt, Shalon E, NP   25 mg at 01/18/24 1654   LORazepam  (ATIVAN ) tablet 1 mg  1 mg Oral TID AC Salmaan Patchin, MD   1 mg at 01/20/24 0653   mirtazapine  (REMERON  SOL-TAB) disintegrating tablet 15 mg  15 mg Oral QHS Onuoha, Josephine C, NP   15 mg at 01/19/24 2105   nicotine  (NICODERM CQ  - dosed in mg/24 hours) patch 14 mg  14 mg Transdermal Daily Ntuen, Tina C, FNP   14 mg at 01/19/24  0825   nicotine  polacrilex (NICORETTE ) gum 2 mg  2 mg Oral PRN Zouev, Dmitri, MD   2 mg at 01/16/24 1706   risperiDONE  (RISPERDAL ) tablet 2 mg  2 mg Oral Q12H Obaloluwa Delatte, MD       traZODone  (DESYREL ) tablet 200 mg  200 mg Oral QHS Keyon Liller, MD   200 mg at 01/19/24 2105    Lab Results:  No results found for this or any previous visit (from the past 48 hours).    Blood Alcohol level:  Lab Results  Component Value Date   [facility] <15 01/11/2024   ETH <10 10/03/2023    Metabolic Disorder Labs: Lab Results  Component Value Date   HGBA1C 5.2 01/15/2024   MPG 103 01/15/2024   MPG 99.67 10/03/2023   No results found for: "PROLACTIN" Lab Results  Component Value Date   CHOL 168 01/15/2024   TRIG 172 (H) 01/15/2024   HDL 42 01/15/2024   CHOLHDL 4.0 01/15/2024   VLDL 34 01/15/2024   LDLCALC 92 01/15/2024   LDLCALC 98 10/03/2023    Physical Findings: AIMS:  , ,  ,  ,    CIWA:    COWS:     Musculoskeletal: Strength & Muscle Tone: within normal limits Gait & Station: normal Patient leans: N/A  Psychiatric Specialty Exam:  Presentation  General Appearance:  Bizarre; Disheveled  Eye Contact: Poor  Speech: Blocked  Speech Volume: Increased  Handedness: Right   Mood and Affect  Mood: Anxious; Dysphoric; Irritable; Labile  Affect: Congruent; Labile   Thought Process  Thought Processes: Disorganized  Descriptions of  Associations:Tangential  Orientation:Full (Time, Place and Person)  Thought Content:Delusions; Illogical; Paranoid Ideation; Perseveration  History of Schizophrenia/Schizoaffective disorder:Yes  Duration of Psychotic Symptoms:Greater than six months  Hallucinations:No data recorded  Ideas of Reference:Percusatory; Paranoia; Delusions  Suicidal Thoughts:No data recorded  Homicidal Thoughts:No data recorded   Sensorium  Memory: Immediate Fair; Remote Poor  Judgment: Poor  Insight: Poor   Executive Functions  Concentration: Poor  Attention Span: Poor  Recall: [employer] of Knowledge: Fair  Language: Fair   Psychomotor Activity  Psychomotor Activity: No data recorded   Assets  Assets: Communication Skills; Desire for Improvement; Physical Health   Sleep  Sleep: No data recorded    Physical Exam: Physical exam: Please see exam on admit note. General: Well developed, well nourished.  Pupils: Normal at 3mm Respiratory: Breathing is unlabored.  Cardiovascular: No edema.  Language: No anomia, no aphasia Muscle strength and tone-pt moving all extremities.  Gait not assessed as pt remained in bed.  Neuro: Facial muscles are symmetric. Pt without tremor, no evidence of hyperarousal.  Review of Systems  Constitutional: Negative.   HENT: Negative.    Eyes: Negative.   Respiratory: Negative.    Cardiovascular: Negative.   Gastrointestinal: Negative.   Genitourinary: Negative.   Musculoskeletal: Negative.   Skin: Negative.   Neurological: Negative.   Endo/Heme/Allergies: Negative.   Psychiatric/Behavioral:  Positive for hallucinations and substance abuse. The patient is nervous/anxious.    Blood pressure 115/80, pulse (!) 126, temperature 97.9 F (36.6 C), temperature source Oral, resp. rate 18, height 6\' 2"  (1.88 m), weight 93.9 kg, SpO2 99%. Body mass index is 26.58 kg/m.   Treatment Plan Summary: Daily contact with patient to assess and  evaluate symptoms and progress in treatment, Medication management, and Plan as noted below  5/16 patient seems to have some improvement in his psychosis with current treatment regimen since titrated risperidone   from 2 to 3 mg twice daily, will follow.   Treatment Plan Summary: Daily contact with patient to assess and evaluate symptoms and progress in treatment, Medication management, and Plan     Observation Level/Precautions:  15 minute checks  Laboratory:  CBC Chemistry Profile Folic Acid HbAIC HCG UDS UA Vitamin B-12  Psychotherapy:  denies  Medications:  olanzapine , Cogentin   Consultations:    Discharge Concerns:  stabilization of psychosis and LAI  Estimated LOS: 5-7 days  Other:      Safety and Monitoring: INVOLUTARILY (completed second exam) admission to inpatient psychiatric unit for safety, stabilization and treatment Daily contact with patient to assess and evaluate symptoms and progress in treatment Patient's case to be discussed in multi-disciplinary team meeting Observation Level : q15 minute checks Vital signs: q12 hours Precautions: suicide, elopement, and assault   2. Psychiatric Problems #schizoaffective disorder bipolar type - 5/16 Depakote  was titrated to 750 mg twice daily and obtain another Depakote  level on 5/20 Depakote  level low therapeutic 47 on 5/15  -cont cogentin  1 mg BID for EPS prophylaxis - 5/17: given patient continues to present psychotic on current dose of risperidone  3 mg twice daily with history of better efficacy for Prolixin  will proceed as follows : 5/17 decrease risperidone  from 3 to 2 mg twice daily with plan to taper it off gradually 5/17 start Prolixin  2.5 mg twice daily for psychosis with plan to titrate to 5 mg twice daily in the next 1 to 2 days   - Continue Ativan  1 mg TID for 3 more days for high anxiety, irritability and mood lability with last dose due 5/18 - Continue trazodone  200 mg at bedtime scheduled for insomnia       3. Medical Management Covid negative CMP: wnl,calcium 8.5 CBC: unremarkable EtOH: <10 UDS: + amphetamine , +THC TSH: ordered A1C:  ordered, pending Lipids: triglycerides elevated otherwise wnl Depakote  level 5/15 low therapeutic 47   #HTN -cont amlodipine  5 mg qdaily   Physician Treatment Plan for Primary Diagnosis: Schizophrenia, unspecified (HCC) Long Term Goal(s): Improvement in symptoms so as ready for discharge   Short Term Goals: Ability to identify changes in lifestyle to reduce recurrence of condition will improve, Ability to verbalize feelings will improve, Ability to disclose and discuss suicidal ideas, Ability to demonstrate self-control will improve, Ability to identify and develop effective coping behaviors will improve, Ability to maintain clinical measurements within normal limits will improve, Compliance with prescribed medications will improve, and Ability to identify triggers associated with substance abuse/mental health issues will improve   Physician Treatment Plan for Secondary Diagnosis: Principal Problem:   Schizophrenia, unspecified (HCC)       I certify that inpatient services furnished can reasonably be expected to improve the patient's condition.      Karmina Zufall Linnie Riches, MD 01/20/2024, 11:32 AM

## 2024-01-20 NOTE — Progress Notes (Signed)
 Patient in room responding to internal stimuli, pacing and glaring at staff, appears agitated. Patient stated I feel irritated and restless. PRN Haldol  5mg  and Benadryl  50mg  po offered and administered.

## 2024-01-20 NOTE — Plan of Care (Signed)
  Problem: Education: Goal: Knowledge of [place] [facility] Education information/materials will improve Outcome: Not Progressing Goal: Emotional status will improve Outcome: Not Progressing Goal: Mental status will improve Outcome: Not Progressing Goal: Verbalization of understanding the information provided will improve Outcome: Not Progressing   Problem: Activity: Goal: Interest or engagement in activities will improve Outcome: Not Progressing

## 2024-01-20 NOTE — Progress Notes (Signed)
 Patient appears confused and disoriented, needs constant verbal redirection. Continues to be on 1:1 for safety

## 2024-01-20 NOTE — Progress Notes (Signed)
 [facility]  1:1 Observation Documentation   Patient's Behavior:  Pt is calm and resting.   Patient's Condition:  No acute distress noted. Sitter remains at bedside.   Patient's Conversation: Patient slept asleep. He would wake up occasionally to make the MHT aware he was going to use the bathroom.   [name] 01/20/2024

## 2024-01-20 NOTE — Group Note (Signed)
 Date:  01/20/2024 Time:  8:34 PM  Group Topic/Focus:  Wrap-Up Group:   The focus of this group is to help patients review their daily goal of treatment and discuss progress on daily workbooks.    Participation Level:  Did Not Attend   Roldan Laforest Dacosta 01/20/2024, 8:34 PM

## 2024-01-21 DIAGNOSIS — F250 Schizoaffective disorder, bipolar type: Secondary | ICD-10-CM | POA: Diagnosis not present

## 2024-01-21 MED ORDER — RISPERIDONE 1 MG PO TABS
1.0000 mg | ORAL_TABLET | Freq: Two times a day (BID) | ORAL | Status: AC
  Administered 2024-01-21 – 2024-01-22 (×2): 1 mg via ORAL
  Filled 2024-01-21 (×2): qty 1

## 2024-01-21 MED ORDER — FLUPHENAZINE HCL 5 MG PO TABS
5.0000 mg | ORAL_TABLET | Freq: Two times a day (BID) | ORAL | Status: DC
  Administered 2024-01-21 – 2024-01-23 (×4): 5 mg via ORAL
  Filled 2024-01-21 (×6): qty 1

## 2024-01-21 NOTE — Progress Notes (Addendum)
  1:1 Note Pt observed in his room a sleep at the shift exchange, pt work up at med pass time, ate snacks, took his evening meds with no issue. Pt stated he had a good day, appetite is good, using bathroom normally. Denied SI and verbally contracted for safety. Pt remains on 1:1 for safety, in bed a sleep at this time, will continue to monitor.

## 2024-01-21 NOTE — Progress Notes (Signed)
   01/21/24 0900  Psych Admission Type (Psych Patients Only)  Admission Status Involuntary  Psychosocial Assessment  Patient Complaints Confusion  Eye Contact Fair  Facial Expression Flat  Affect Preoccupied  Speech Slow  Interaction Minimal  Motor Activity Slow  Appearance/Hygiene Unremarkable  Behavior Characteristics Appropriate to situation  Mood Pleasant  Thought Process  Coherency WDL  Content Preoccupation  Delusions None reported or observed  Perception WDL  Hallucination Auditory  Judgment Poor  Confusion Mild  Danger to Self  Current suicidal ideation? Denies  Agreement Not to Harm Self Yes  Description of Agreement verbal  Danger to Others  Danger to Others None reported or observed

## 2024-01-21 NOTE — Progress Notes (Signed)
 1:1 Note Pt showered, calm. Pt is pleasant and denies SI, will continue to monitor.

## 2024-01-21 NOTE — Progress Notes (Signed)
   01/20/24 2200  Psych Admission Type (Psych Patients Only)  Admission Status Involuntary  Psychosocial Assessment  Patient Complaints Confusion  Eye Contact Fair  Facial Expression Animated  Affect Appropriate to circumstance  Speech Slow  Interaction Minimal  Motor Activity Slow  Appearance/Hygiene Body odor  Behavior Characteristics Cooperative;Appropriate to situation  Mood Pleasant  Thought Process  Coherency WDL  Content Preoccupation  Delusions None reported or observed  Perception WDL  Hallucination None reported or observed  Judgment Poor  Confusion Mild  Danger to Self  Current suicidal ideation? Denies  Description of Suicide Plan none  Agreement Not to Harm Self Yes  Description of Agreement verbal  Danger to Others  Danger to Others None reported or observed

## 2024-01-21 NOTE — Progress Notes (Signed)
 1:1 Note Patient pacing halls, saying the voices are bothering him, glaring at staff and responding to internal stimuli. Patient administered agitation protocol, PRN Haldol  5mg  and Benadryl  50mg  PO

## 2024-01-21 NOTE — Progress Notes (Signed)
 [facility] MD Progress Note  01/21/2024 9:47 AM William Romero  MRN:  [id number]  Per chart review, vitals are stable, patient is compliant with medications on the unit.  As needed Atarax  for anxiety was used 3 times on 5/12, and once on 5/15 in the afternoon.  As needed Haldol  and Benadryl  were given for irritability and restlessness 5/17 at 1 PM.  Patient was reported by staff that on 5/17 was restless and disorganized but later in the day specifically in the evening he became much [name] and more cooperative and less disorganized, was reported to have better sleep at night, staff reported patient slept 10 hours on 5/17..  Subjective:   Patient was assessed in his room today, he does present linear this morning telling me that the voices are "minimum" confirms auditory hallucinations with decreased loudness and decrease the frequency last time earlier this morning but he adds in a organized linear manner "I do not pay attention to them" he notes he is able to distract himself from the voices.  He presents much less anxious and irritable this morning with better eye contact and more linear speech.  He denies VH, denies SI or HI, denies side effect of medications, does not display any sign consistent with EPS or TD and does not appear sleepy.  He reports better sleep last night and fair appetite. He agrees to continue to comply with medications. He remains on one-to-one, I discussed with staff we will continue one-to-one for today and if he continues to improve may discontinue tomorrow.  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 Augusta Blizzard 04/13/2023, poor  followup Current therapist: none. Previous reported psychiatric diagnoses:  ?ADHD, schizoaffective disorder bipolar type, GAD Current psychiatric medications: none. Psychiatric medication history/compliance: Per chart review, poor with the exception of Adderall.  Full med trials include: adderall, bupropion, duloxetine , haloperidol , hydroxyzine  (ineffective), lorazepam , olanzapine , ziprasidone , paliperidone , risperidone ,  Psychiatric hospitalization(s): Approximately 6 stays over the past from 2012-2019.  Most recently, patient had 3 separate stays at [facility] in 2024, 6/11 - 6/19 and 6/22 - 7/1, and on 10/2023 Psychotherapy history: None Neuromodulation history: none. History of suicide (obtained from HPI): Reported 1 attempted OD in distant past, presently denied History of homicide or aggression (obtained in HPI): none.  Past Medical History:  Past Medical History:  Diagnosis Date   Paranoid schizophrenia (HCC) 10/03/2023   Schizophrenia (HCC)     Past Surgical History:  Procedure Laterality Date   APPENDECTOMY     Family History: History reviewed. No pertinent family history. Family Psychiatric  History:  Denies family psychiatric history  Social History:  Social History   Substance and Sexual Activity  Alcohol Use Not Currently   Alcohol/week: 5.0 standard drinks of alcohol   Types: 5 Cans of beer per week     Social History   Substance and Sexual Activity  Drug Use No    Social History   Socioeconomic History   Marital status: Single    Spouse name: Not on file   Number of children: Not on file   Years of education: Not on file   Highest education level: Not on file  Occupational History   Not on file  Tobacco Use  Smoking status: Former    Current packs/day: 0.50    Types: Cigarettes   Smokeless tobacco: Never  Vaping Use   Vaping status: Never Used  Substance and Sexual Activity   Alcohol use: Not Currently    Alcohol/week: 5.0 standard drinks of alcohol     Types: 5 Cans of beer per week   Drug use: No   Sexual activity: Not Currently  Other Topics Concern   Not on file  Social History Narrative   Not on file   Social Drivers of Health   Financial Resource Strain: Not on file  Food Insecurity: No Food Insecurity (01/14/2024)   Hunger Vital Sign    Worried About Running Out of Food in the Last Year: Never true    Ran Out of Food in the Last Year: Never true  Transportation Needs: No Transportation Needs (01/14/2024)   PRAPARE - Administrator, Civil Service (Medical): No    Lack of Transportation (Non-Medical): No  Physical Activity: Not on file  Stress: Not on file  Social Connections: Not on file   Additional Social History:                         Sleep: Fair  Appetite:  Fair  Current Medications: Current Facility-Administered Medications  Medication Dose Route Frequency Provider Last Rate Last Admin   acetaminophen  (TYLENOL ) tablet 650 mg  650 mg Oral Q6H PRN Onuoha, Josephine C, NP   650 mg at 01/18/24 0948   alum & mag hydroxide-simeth (MAALOX/MYLANTA) 200-200-20 MG/5ML suspension 30 mL  30 mL Oral Q4H PRN Onuoha, Josephine C, NP       benztropine  (COGENTIN ) tablet 1 mg  1 mg Oral Q12H Zouev, Dmitri, MD   1 mg at 01/21/24 0810   haloperidol  (HALDOL ) tablet 5 mg  5 mg Oral TID PRN Onuoha, Josephine C, NP   5 mg at 01/20/24 1331   And   diphenhydrAMINE  (BENADRYL ) capsule 50 mg  50 mg Oral TID PRN Onuoha, Josephine C, NP   50 mg at 01/20/24 1330   haloperidol  lactate (HALDOL ) injection 5 mg  5 mg Intramuscular TID PRN Onuoha, Josephine C, NP   5 mg at 01/17/24 1539   And   diphenhydrAMINE  (BENADRYL ) injection 50 mg  50 mg Intramuscular TID PRN Onuoha, Josephine C, NP       And   LORazepam  (ATIVAN ) injection 2 mg  2 mg Intramuscular TID PRN Onuoha, Josephine C, NP       haloperidol  lactate (HALDOL ) injection 10 mg  10 mg Intramuscular TID PRN Onuoha, Josephine C, NP       And   diphenhydrAMINE  (BENADRYL )  injection 50 mg  50 mg Intramuscular TID PRN Onuoha, Josephine C, NP   50 mg at 01/17/24 1540   And   LORazepam  (ATIVAN ) injection 2 mg  2 mg Intramuscular TID PRN Onuoha, Josephine C, NP   2 mg at 01/17/24 1539   divalproex  (DEPAKOTE ) DR tablet 750 mg  750 mg Oral Q12H Sebastin Perlmutter, MD   750 mg at 01/21/24 0810   fluPHENAZine  (PROLIXIN ) tablet 2.5 mg  2.5 mg Oral BID Linnie Riches, Winferd Wease, MD   2.5 mg at 01/21/24 0815   hydrOXYzine  (ATARAX ) tablet 25 mg  25 mg Oral TID PRN Bobbitt, Shalon E, NP   25 mg at 01/18/24 1654   LORazepam  (ATIVAN ) tablet 1 mg  1 mg Oral TID AC Nakina Spatz, MD   1 mg  at 01/21/24 1610   mirtazapine  (REMERON  SOL-TAB) disintegrating tablet 15 mg  15 mg Oral QHS Onuoha, Josephine C, NP   15 mg at 01/20/24 2049   nicotine  (NICODERM CQ  - dosed in mg/24 hours) patch 14 mg  14 mg Transdermal Daily Ntuen, Tina C, FNP   14 mg at 01/19/24 0825   nicotine  polacrilex (NICORETTE ) gum 2 mg  2 mg Oral PRN Zouev, Dmitri, MD   2 mg at 01/16/24 1706   risperiDONE  (RISPERDAL ) tablet 2 mg  2 mg Oral Q12H Cylus Douville, MD   2 mg at 01/21/24 9604   traZODone  (DESYREL ) tablet 200 mg  200 mg Oral QHS Marijean Montanye, MD   200 mg at 01/20/24 2049    Lab Results:  No results found for this or any previous visit (from the past 48 hours).    Blood Alcohol level:  Lab Results  Component Value Date   [facility] <15 01/11/2024   ETH <10 10/03/2023    Metabolic Disorder Labs: Lab Results  Component Value Date   HGBA1C 5.2 01/15/2024   MPG 103 01/15/2024   MPG 99.67 10/03/2023   No results found for: "PROLACTIN" Lab Results  Component Value Date   CHOL 168 01/15/2024   TRIG 172 (H) 01/15/2024   HDL 42 01/15/2024   CHOLHDL 4.0 01/15/2024   VLDL 34 01/15/2024   LDLCALC 92 01/15/2024   LDLCALC 98 10/03/2023    Physical Findings: AIMS:  , ,  ,  ,    CIWA:    COWS:     Musculoskeletal: Strength & Muscle Tone: within normal limits Gait & Station: normal Patient leans: N/A  Psychiatric  Specialty Exam:  Presentation  General Appearance:  Bizarre; Disheveled  Eye Contact: Improved  Speech: Improved, more linear  Speech Volume: Normal, not increased  Handedness: Right   Mood and Affect  Mood: Dysphoric, less anxious and less irritable  Affect: Congruent   Thought Process  Thought Processes: Less disorganized  Descriptions of Associations: Less tangential more concrete  Orientation:Full (Time, Place and Person)  Thought Content: Less disorganized and delusional, improved auditory hallucinations  History of Schizophrenia/Schizoaffective disorder:Yes  Duration of Psychotic Symptoms:Greater than six months  Hallucinations:No data recorded Decreased auditory hallucinations Ideas of Reference:Decreased to paranoia Suicidal Thoughts:No data recorded Denies passive or active SI Homicidal Thoughts:No data recorded Denies HI  Sensorium  Memory: Immediate Fair; Remote Poor  Judgment: Limited  Insight: Limited   Executive Functions  Concentration: Improved yet limited  Attention Span: Improved yet limited  Recall: [employer] of Knowledge: Fair  Language: Fair   Psychomotor Activity  Psychomotor Activity: No data recorded   Assets  Assets: Communication Skills; Desire for Improvement; Physical Health   Sleep  Sleep: No data recorded    Physical Exam: Physical exam: Please see exam on admit note. General: Well developed, well nourished.  Pupils: Normal at 3mm Respiratory: Breathing is unlabored.  Cardiovascular: No edema.  Language: No anomia, no aphasia Muscle strength and tone-pt moving all extremities.  Gait not assessed as pt remained in bed.  Neuro: Facial muscles are symmetric. Pt without tremor, no evidence of hyperarousal.  Review of Systems  Constitutional: Negative.   HENT: Negative.    Eyes: Negative.   Respiratory: Negative.    Cardiovascular: Negative.   Gastrointestinal: Negative.    Genitourinary: Negative.   Musculoskeletal: Negative.   Skin: Negative.   Neurological: Negative.   Endo/Heme/Allergies: Negative.   Psychiatric/Behavioral:  Positive for hallucinations and substance abuse. The  patient is nervous/anxious.    Blood pressure (!) 116/95, pulse 97, temperature 97.9 F (36.6 C), temperature source Oral, resp. rate 16, height 6\' 2"  (1.88 m), weight 93.9 kg, SpO2 100%. Body mass index is 26.58 kg/m.   Treatment Plan Summary: Daily contact with patient to assess and evaluate symptoms and progress in treatment, Medication management, and Plan as noted below  5/16 patient seems to have some improvement in his psychosis with current treatment regimen since titrated risperidone  from 2 to 3 mg twice daily, will follow.   Treatment Plan Summary: Daily contact with patient to assess and evaluate symptoms and progress in treatment, Medication management, and Plan     Observation Level/Precautions:  15 minute checks  Laboratory:  CBC Chemistry Profile Folic Acid HbAIC HCG UDS UA Vitamin B-12  Psychotherapy:  denies  Medications:  olanzapine , Cogentin   Consultations:    Discharge Concerns:  stabilization of psychosis and LAI  Estimated LOS: 5-7 days  Other:      Safety and Monitoring: INVOLUTARILY (completed second exam) admission to inpatient psychiatric unit for safety, stabilization and treatment Daily contact with patient to assess and evaluate symptoms and progress in treatment Patient's case to be discussed in multi-disciplinary team meeting Observation Level : q15 minute checks Vital signs: q12 hours Precautions: suicide, elopement, and assault   2. Psychiatric Problems #schizoaffective disorder bipolar type - 5/16 Depakote  was titrated to 750 mg twice daily and obtain another Depakote  level on 5/20 Depakote  level low therapeutic 47 on 5/15  -cont cogentin  1 mg BID for EPS prophylaxis - 5/17: Titrate Prolixin  to 5 mg twice daily to address  psychosis, taper down further risperidone  to 1 mg twice daily for 3 more doses then discontinue   - Continue Ativan  1 mg TID for 3 more days for high anxiety, irritability and mood lability with last dose due 5/18 - Continue trazodone  200 mg at bedtime scheduled for insomnia      3. Medical Management Covid negative CMP: wnl,calcium 8.5 CBC: unremarkable EtOH: <10 UDS: + amphetamine , +THC TSH: ordered A1C:  ordered, pending Lipids: triglycerides elevated otherwise wnl Depakote  level 5/15 low therapeutic 47   #HTN -cont amlodipine  5 mg qdaily   Physician Treatment Plan for Primary Diagnosis: Schizophrenia, unspecified (HCC) Long Term Goal(s): Improvement in symptoms so as ready for discharge   Short Term Goals: Ability to identify changes in lifestyle to reduce recurrence of condition will improve, Ability to verbalize feelings will improve, Ability to disclose and discuss suicidal ideas, Ability to demonstrate self-control will improve, Ability to identify and develop effective coping behaviors will improve, Ability to maintain clinical measurements within normal limits will improve, Compliance with prescribed medications will improve, and Ability to identify triggers associated with substance abuse/mental health issues will improve   Physician Treatment Plan for Secondary Diagnosis: Principal Problem:   Schizophrenia, unspecified (HCC)       I certify that inpatient services furnished can reasonably be expected to improve the patient's condition.      Britni Driscoll Linnie Riches, MD 01/21/2024, 9:47 AM

## 2024-01-21 NOTE — Plan of Care (Signed)

## 2024-01-21 NOTE — Plan of Care (Signed)
  Problem: Education: Goal: Emotional status will improve Outcome: Progressing Goal: Mental status will improve Outcome: Progressing   Problem: Coping: Goal: Ability to verbalize frustrations and anger appropriately will improve Outcome: Progressing   

## 2024-01-21 NOTE — Progress Notes (Signed)
 1:1 Note Pt ate lunch and drank fluids, patient stated he felt anxious, hydroxyzine  25mg  po offered and administered. Will continue to monitor

## 2024-01-21 NOTE — Progress Notes (Signed)
 1:1 Note Pt is currently a sleep with even and easy respiration. No issues reported or observed, remains on 1:1 for safety, will continue to monitor.

## 2024-01-21 NOTE — Progress Notes (Signed)
 1:1 Note Pt woke up this morning feel better, stated he slept and wanted when to take his Ativan . The scheduled Ativan  given. Pt is pleasant and denied SI, will continue to monitor.

## 2024-01-21 NOTE — Progress Notes (Signed)
 1:1 Note Pt has calm and cooperative this evening, attended the group, took all his medication without any problems, ate snacks. No issues reported or observed, denies SI/HI and contracted fro safety. Remains on 1:1 for safety, will continue to monitor.

## 2024-01-21 NOTE — Group Note (Signed)
 Date:  01/21/2024 Time:  9:16 PM  Group Topic/Focus:  Wrap-Up Group:   The focus of this group is to help patients review their daily goal of treatment and discuss progress on daily workbooks.    Participation Level:  Minimal  Participation Quality:  Appropriate  Affect:  Appropriate  Cognitive:  Alert  Insight: Good  Engagement in Group:  Engaged  Modes of Intervention:  Clarification  Additional Comments:  Patient was present for the whole group.   William Romero  William Romero 01/21/2024, 9:16 PM

## 2024-01-22 DIAGNOSIS — F250 Schizoaffective disorder, bipolar type: Secondary | ICD-10-CM | POA: Diagnosis not present

## 2024-01-22 NOTE — Group Note (Signed)
 Date:  01/22/2024 Time:  8:51 PM  Group Topic/Focus:  Wrap-Up Group:   The focus of this group is to help patients review their daily goal of treatment and discuss progress on daily workbooks.    Participation Level:  Did Not Attend   Trinette Vera Dacosta 01/22/2024, 8:51 PM

## 2024-01-22 NOTE — Group Note (Signed)
 Date:  01/22/2024 Time:  1:03 PM  Group Topic/Focus:  Goals Group:   The focus of this group is to help patients establish daily goals to achieve during treatment and discuss how the patient can incorporate goal setting into their daily lives to aide in recovery.    Participation Level:  Did Not Attend     William Romero 01/22/2024, 1:03 PM

## 2024-01-22 NOTE — Group Note (Signed)
 [name] Group Therapy Note   Group Date: 01/22/2024 Start Time: 1300 End Time: 1400   Participation:  did not attend    Title: "Healing Flames: Navigating Anger with Compassion"  Objective:  Foster self-awareness and promote compassion toward oneself and others when dealing with anger.  Goals:  Help participants understand the underlying emotions and needs fueling anger. Provide coping strategies for healthier emotional expression and anger management.  Summary: This session explored anger as a volcano--an explosion driven by deeper feelings and unmet needs. Participants learned to identify anger triggers and underlying emotions, then practiced coping strategies like deep breathing, physical activity, and journaling. The group discussed healthy ways to manage anger before it escalates, using both personal reflection and shared experiences.  Therapeutic Modalities: Cognitive Behavioral Therapy (CBT): Challenging thoughts that fuel anger. Mindfulness: Increasing awareness of emotions and sensations.   Kimeka Badour O Tanda Morrissey, LCSWA 01/22/2024  2:14 PM

## 2024-01-22 NOTE — Progress Notes (Signed)
 1;1 Note Pt in bed as sleep at this time, respirations are  even and unlabored, remains on 1:1 for safety, will continue to monitor.

## 2024-01-22 NOTE — Progress Notes (Signed)
   01/22/24 0807  Psych Admission Type (Psych Patients Only)  Admission Status Involuntary  Psychosocial Assessment  Patient Complaints Other (Comment) (auditory hallucinations)  Eye Contact Fair  Facial Expression Flat  Affect Appropriate to circumstance  Speech Slow  Interaction Assertive  Motor Activity Slow  Appearance/Hygiene Disheveled  Behavior Characteristics Appropriate to situation  Mood Anxious  Thought Process  Coherency WDL  Content Preoccupation  Delusions None reported or observed  Perception WDL  Hallucination Auditory  Judgment Impaired  Confusion None  Danger to Self  Current suicidal ideation? Denies  Agreement Not to Harm Self Yes  Description of Agreement verbal  Danger to Others  Danger to Others None reported or observed

## 2024-01-22 NOTE — Group Note (Signed)
 Recreation Therapy Group Note   Group Topic:Coping Skills  Group Date: 01/22/2024 Start Time: 1025 End Time: 1051 Facilitators: Esbeydi Manago-McCall, LRT,CTRS Location: [address] Dayroom   Group Topic: Coping Skills   Goal Area(s) Addresses:  Patient will identify positive coping skill techniques. Patient will identify benefits of using coping skills post d/c.   Behavioral Response: N/A   Intervention: Worksheet, Group brain storming   Activity :  Mind Map.  Patient was provided a blank template of a diagram with 32 blank boxes in a tiered system, branching from the center (similar to a bubble chart). LRT directed patients to label the middle of the diagram "Coping Skills" and consider 8 different sources in which coping skills would be used. LRT and patients came up with the instances (stress, anger, finances, family, depression, self harm, relationships and addiction) in which coping skills would be used and recorded them in the 2nd tier boxes closest to the center. Patients were to then come up with 3 coping skills to address each identified area in the remaining boxes stemming from a particular situation. Patients were encouraged to share ideas as LRT wrote responses on the board.   Education:  Pharmacologist, Building control surveyor.    Education Outcome: Acknowledges Education   Affect/Mood: N/A   Participation Level: Did not attend    Clinical Observations/Individualized Feedback:     Plan: Continue to engage patient in RT group sessions 2-3x/week.   Laisa Larrick-McCall, LRT,CTRS 01/22/2024 12:42 PM

## 2024-01-22 NOTE — Progress Notes (Signed)
 [facility] MD Progress Note  01/22/2024 11:35 AM William Romero  MRN:  [id number]  Per chart review, vitals are stable, patient is compliant with medications on the unit.  As needed Atarax  for anxiety being used average once to twice daily.  As needed Haldol  and Benadryl  were given for irritability and restlessness 5/18 at 6 PM.  Per staff patient continues to have occasions of reporting worsening voices, wandering around the unit requiring redirection, on one-to-one precaution which will continue.  Subjective:   Patient was assessed in his room today, he presents to me today reporting voices are ongoing and they are getting worse "they are getting worse they are saying bad words yes they are less frequent but they are still there" he tells me that at baseline when he is doing well "voices are there but I do my best to ignore them I usually can ignore them but now I cannot all the time" he denies paranoia or other delusions he presents linear able to response to questions denies side effect to medications does not appear sleepy does not display any sign consistent with EPS or TD reports better sleep last night and fair appetite.  Reports himself to be 50% of the baseline.  Prolixin  was titrated up last night to 5 mg twice daily which will be continued at this time with plan to further titrate if needed in the next 1 to 2 days.     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 Augusta Blizzard 04/13/2023, poor followup Current therapist: none. Previous reported psychiatric diagnoses:  ?ADHD, schizoaffective disorder bipolar type, GAD Current psychiatric medications:  none. Psychiatric medication history/compliance: Per chart review, poor with the exception of Adderall.  Full med trials include: adderall, bupropion, duloxetine , haloperidol , hydroxyzine  (ineffective), lorazepam , olanzapine , ziprasidone , paliperidone , risperidone ,  Psychiatric hospitalization(s): Approximately 6 stays over the past from 2012-2019.  Most recently, patient had 3 separate stays at [facility] in 2024, 6/11 - 6/19 and 6/22 - 7/1, and on 10/2023 Psychotherapy history: None Neuromodulation history: none. History of suicide (obtained from HPI): Reported 1 attempted OD in distant past, presently denied History of homicide or aggression (obtained in HPI): none.  Past Medical History:  Past Medical History:  Diagnosis Date   Paranoid schizophrenia (HCC) 10/03/2023   Schizophrenia (HCC)     Past Surgical History:  Procedure Laterality Date   APPENDECTOMY     Family History: History reviewed. No pertinent family history. Family Psychiatric  History:  Denies family psychiatric history  Social History:  Social History   Substance and Sexual Activity  Alcohol Use Not Currently   Alcohol/week: 5.0 standard drinks of alcohol   Types: 5 Cans of beer per week     Social History   Substance and Sexual Activity  Drug Use No    Social History   Socioeconomic History   Marital status: Single    Spouse name: Not on file   Number of children: Not on file   Years of education: Not on file   Highest education level: Not on file  Occupational History   Not on file  Tobacco Use   Smoking status: Former    Current packs/day: 0.50    Types: Cigarettes   Smokeless  tobacco: Never  Vaping Use   Vaping status: Never Used  Substance and Sexual Activity   Alcohol use: Not Currently    Alcohol/week: 5.0 standard drinks of alcohol    Types: 5 Cans of beer per week   Drug use: No   Sexual activity: Not Currently  Other Topics Concern   Not on file  Social History Narrative   Not on file    Social Drivers of Health   Financial Resource Strain: Not on file  Food Insecurity: No Food Insecurity (01/14/2024)   Hunger Vital Sign    Worried About Running Out of Food in the Last Year: Never true    Ran Out of Food in the Last Year: Never true  Transportation Needs: No Transportation Needs (01/14/2024)   PRAPARE - Administrator, Civil Service (Medical): No    Lack of Transportation (Non-Medical): No  Physical Activity: Not on file  Stress: Not on file  Social Connections: Not on file   Additional Social History:                         Sleep: Fair  Appetite:  Fair  Current Medications: Current Facility-Administered Medications  Medication Dose Route Frequency Provider Last Rate Last Admin   acetaminophen  (TYLENOL ) tablet 650 mg  650 mg Oral Q6H PRN Onuoha, Josephine C, NP   650 mg at 01/18/24 0948   alum & mag hydroxide-simeth (MAALOX/MYLANTA) 200-200-20 MG/5ML suspension 30 mL  30 mL Oral Q4H PRN Onuoha, Josephine C, NP       benztropine  (COGENTIN ) tablet 1 mg  1 mg Oral Q12H Zouev, Dmitri, MD   1 mg at 01/22/24 1610   haloperidol  (HALDOL ) tablet 5 mg  5 mg Oral TID PRN Onuoha, Josephine C, NP   5 mg at 01/21/24 1819   And   diphenhydrAMINE  (BENADRYL ) capsule 50 mg  50 mg Oral TID PRN Onuoha, Josephine C, NP   50 mg at 01/21/24 1819   haloperidol  lactate (HALDOL ) injection 5 mg  5 mg Intramuscular TID PRN Onuoha, Josephine C, NP   5 mg at 01/17/24 1539   And   diphenhydrAMINE  (BENADRYL ) injection 50 mg  50 mg Intramuscular TID PRN Onuoha, Josephine C, NP       And   LORazepam  (ATIVAN ) injection 2 mg  2 mg Intramuscular TID PRN Onuoha, Josephine C, NP       haloperidol  lactate (HALDOL ) injection 10 mg  10 mg Intramuscular TID PRN Onuoha, Josephine C, NP       And   diphenhydrAMINE  (BENADRYL ) injection 50 mg  50 mg Intramuscular TID PRN Onuoha, Josephine C, NP   50 mg at 01/17/24 1540   And   LORazepam  (ATIVAN ) injection 2 mg  2 mg Intramuscular  TID PRN Onuoha, Josephine C, NP   2 mg at 01/17/24 1539   divalproex  (DEPAKOTE ) DR tablet 750 mg  750 mg Oral Q12H Anaid Haney, MD   750 mg at 01/22/24 0807   fluPHENAZine  (PROLIXIN ) tablet 5 mg  5 mg Oral BID Linnie Riches, Elwood Bazinet, MD   5 mg at 01/22/24 0806   hydrOXYzine  (ATARAX ) tablet 25 mg  25 mg Oral TID PRN Bobbitt, Shalon E, NP   25 mg at 01/22/24 9604   mirtazapine  (REMERON  SOL-TAB) disintegrating tablet 15 mg  15 mg Oral QHS Onuoha, Josephine C, NP   15 mg at 01/21/24 2039   nicotine  (NICODERM CQ  - dosed in mg/24 hours) patch 14  mg  14 mg Transdermal Daily Ntuen, Tina C, FNP   14 mg at 01/19/24 0825   nicotine  polacrilex (NICORETTE ) gum 2 mg  2 mg Oral PRN Zouev, Dmitri, MD   2 mg at 01/16/24 1706   traZODone  (DESYREL ) tablet 200 mg  200 mg Oral QHS Nyquan Selbe, MD   200 mg at 01/21/24 2039    Lab Results:  No results found for this or any previous visit (from the past 48 hours).    Blood Alcohol level:  Lab Results  Component Value Date   [facility] <15 01/11/2024   ETH <10 10/03/2023    Metabolic Disorder Labs: Lab Results  Component Value Date   HGBA1C 5.2 01/15/2024   MPG 103 01/15/2024   MPG 99.67 10/03/2023   No results found for: "PROLACTIN" Lab Results  Component Value Date   CHOL 168 01/15/2024   TRIG 172 (H) 01/15/2024   HDL 42 01/15/2024   CHOLHDL 4.0 01/15/2024   VLDL 34 01/15/2024   LDLCALC 92 01/15/2024   LDLCALC 98 10/03/2023    Physical Findings: AIMS:  , ,  ,  ,    CIWA:    COWS:     Musculoskeletal: Strength & Muscle Tone: within normal limits Gait & Station: normal Patient leans: N/A  Psychiatric Specialty Exam:  Presentation  General Appearance:  Bizarre; Disheveled  Eye Contact: Improved  Speech: Improved, more linear  Speech Volume: Normal, not increased  Handedness: Right   Mood and Affect  Mood: Dysphoric, less anxious and less irritable  Affect: Congruent   Thought Process  Thought Processes: Less  disorganized  Descriptions of Associations: Less tangential more concrete  Orientation:Full (Time, Place and Person)  Thought Content: Less disorganized and delusional, improved auditory hallucinations  History of Schizophrenia/Schizoaffective disorder:Yes  Duration of Psychotic Symptoms:Greater than six months  Hallucinations:No data recorded Decreased auditory hallucinations Ideas of Reference:Decreased to paranoia Suicidal Thoughts:No data recorded Denies passive or active SI Homicidal Thoughts:No data recorded Denies HI  Sensorium  Memory: Immediate Fair; Remote Poor  Judgment: Limited  Insight: Limited   Executive Functions  Concentration: Improved yet limited  Attention Span: Improved yet limited  Recall: [employer] of Knowledge: Fair  Language: Fair   Psychomotor Activity  Psychomotor Activity: No data recorded   Assets  Assets: Communication Skills; Desire for Improvement; Physical Health   Sleep  Sleep: No data recorded    Physical Exam: Physical exam: Please see exam on admit note. General: Well developed, well nourished.  Pupils: Normal at 3mm Respiratory: Breathing is unlabored.  Cardiovascular: No edema.  Language: No anomia, no aphasia Muscle strength and tone-pt moving all extremities.  Gait not assessed as pt remained in bed.  Neuro: Facial muscles are symmetric. Pt without tremor, no evidence of hyperarousal.  Review of Systems  Constitutional: Negative.   HENT: Negative.    Eyes: Negative.   Respiratory: Negative.    Cardiovascular: Negative.   Gastrointestinal: Negative.   Genitourinary: Negative.   Musculoskeletal: Negative.   Skin: Negative.   Neurological: Negative.   Endo/Heme/Allergies: Negative.   Psychiatric/Behavioral:  Positive for hallucinations and substance abuse. The patient is nervous/anxious.    Blood pressure (!) 130/92, pulse (!) 102, temperature 98.4 F (36.9 C), temperature source Oral,  resp. rate 16, height 6\' 2"  (1.88 m), weight 93.9 kg, SpO2 99%. Body mass index is 26.58 kg/m.   Treatment Plan Summary: Daily contact with patient to assess and evaluate symptoms and progress in treatment, Medication management, and Plan  as noted below  5/16 patient seems to have some improvement in his psychosis with current treatment regimen since titrated risperidone  from 2 to 3 mg twice daily, will follow.   Treatment Plan Summary: Daily contact with patient to assess and evaluate symptoms and progress in treatment, Medication management, and Plan     Observation Level/Precautions:  15 minute checks  Laboratory:  CBC Chemistry Profile Folic Acid HbAIC HCG UDS UA Vitamin B-12  Psychotherapy:  denies  Medications:  olanzapine , Cogentin   Consultations:    Discharge Concerns:  stabilization of psychosis and LAI  Estimated LOS: 5-7 days  Other:      Safety and Monitoring: INVOLUTARILY (completed second exam) admission to inpatient psychiatric unit for safety, stabilization and treatment Daily contact with patient to assess and evaluate symptoms and progress in treatment Patient's case to be discussed in multi-disciplinary team meeting Observation Level : q15 minute checks Vital signs: q12 hours Precautions: suicide, elopement, and assault   2. Psychiatric Problems #schizoaffective disorder bipolar type - 5/16 Depakote  was titrated to 750 mg twice daily and obtain another Depakote  level on 5/20 Depakote  level low therapeutic 47 on 5/15  -cont cogentin  1 mg BID for EPS prophylaxis - 5/17: Titrate Prolixin  to 5 mg twice daily to address psychosis, taper down further risperidone  to 1 mg twice daily for 3 more doses then discontinue Given history of fair response to Prolixin  during previous hospitalization we will consider titrating Prolixin  to 5 mg 3 times daily and if helpful will consider using Prolixin  LAI.  - Patient Ativan  1 mg TID for 3-5 days for high anxiety,  irritability and mood lability with last dose due 5/18 - Continue trazodone  200 mg at bedtime scheduled for insomnia      3. Medical Management Covid negative CMP: wnl,calcium 8.5 CBC: unremarkable EtOH: <10 UDS: + amphetamine , +THC TSH: ordered A1C:  ordered, pending Lipids: triglycerides elevated otherwise wnl Depakote  level 5/15 low therapeutic 47 Depakote  level due 5/20   #HTN -cont amlodipine  5 mg qdaily   Physician Treatment Plan for Primary Diagnosis: Schizophrenia, unspecified (HCC) Long Term Goal(s): Improvement in symptoms so as ready for discharge   Short Term Goals: Ability to identify changes in lifestyle to reduce recurrence of condition will improve, Ability to verbalize feelings will improve, Ability to disclose and discuss suicidal ideas, Ability to demonstrate self-control will improve, Ability to identify and develop effective coping behaviors will improve, Ability to maintain clinical measurements within normal limits will improve, Compliance with prescribed medications will improve, and Ability to identify triggers associated with substance abuse/mental health issues will improve   Physician Treatment Plan for Secondary Diagnosis: Principal Problem:   Schizophrenia, unspecified (HCC)       I certify that inpatient services furnished can reasonably be expected to improve the patient's condition.      Dalisha Shively Linnie Riches, MD 01/22/2024, 11:35 AM

## 2024-01-22 NOTE — Plan of Care (Signed)

## 2024-01-23 ENCOUNTER — Inpatient Hospital Stay (HOSPITAL_COMMUNITY)
Admission: AD | Admit: 2024-01-23 | Discharge: 2024-01-29 | Disposition: A | Discharge status: Home / Self Care | Source: Intra-hospital | Attending: Psychiatry | Admitting: Psychiatry

## 2024-01-23 DIAGNOSIS — F250 Schizoaffective disorder, bipolar type: Secondary | ICD-10-CM | POA: Diagnosis not present

## 2024-01-23 DIAGNOSIS — K5900 Constipation, unspecified: Secondary | ICD-10-CM | POA: Diagnosis present

## 2024-01-23 DIAGNOSIS — F909 Attention-deficit hyperactivity disorder, unspecified type: Secondary | ICD-10-CM | POA: Diagnosis present

## 2024-01-23 DIAGNOSIS — Z9151 Personal history of suicidal behavior: Secondary | ICD-10-CM

## 2024-01-23 DIAGNOSIS — F411 Generalized anxiety disorder: Secondary | ICD-10-CM | POA: Diagnosis present

## 2024-01-23 DIAGNOSIS — R072 Precordial pain: Secondary | ICD-10-CM | POA: Diagnosis present

## 2024-01-23 DIAGNOSIS — F209 Schizophrenia, unspecified: Secondary | ICD-10-CM | POA: Diagnosis present

## 2024-01-23 DIAGNOSIS — Z716 Tobacco abuse counseling: Secondary | ICD-10-CM

## 2024-01-23 DIAGNOSIS — F1210 Cannabis abuse, uncomplicated: Secondary | ICD-10-CM | POA: Diagnosis present

## 2024-01-23 DIAGNOSIS — Z79899 Other long term (current) drug therapy: Secondary | ICD-10-CM

## 2024-01-23 DIAGNOSIS — I10 Essential (primary) hypertension: Secondary | ICD-10-CM | POA: Diagnosis present

## 2024-01-23 DIAGNOSIS — F17210 Nicotine dependence, cigarettes, uncomplicated: Secondary | ICD-10-CM | POA: Diagnosis present

## 2024-01-23 DIAGNOSIS — G4700 Insomnia, unspecified: Secondary | ICD-10-CM | POA: Diagnosis present

## 2024-01-23 DIAGNOSIS — Z91148 Patient's other noncompliance with medication regimen for other reason: Secondary | ICD-10-CM

## 2024-01-23 DIAGNOSIS — I951 Orthostatic hypotension: Secondary | ICD-10-CM | POA: Diagnosis present

## 2024-01-23 DIAGNOSIS — R0789 Other chest pain: Secondary | ICD-10-CM

## 2024-01-23 LAB — HEPATIC FUNCTION PANEL
ALT: 19 U/L (ref 0–44)
AST: 21 U/L (ref 15–41)
Albumin: 3.6 g/dL (ref 3.5–5.0)
Alkaline Phosphatase: 64 U/L (ref 38–126)
Bilirubin, Direct: <0.1 mg/dL (ref 0.0–0.2)
Total Bilirubin: 0.5 mg/dL (ref 0.0–1.2)
Total Protein: 6.9 g/dL (ref 6.5–8.1)

## 2024-01-23 LAB — VALPROIC ACID LEVEL: Valproic Acid Lvl: 83 ug/mL (ref 50–100)

## 2024-01-23 MED ORDER — NICOTINE 21 MG/24HR TD PT24
21.0000 mg | MEDICATED_PATCH | Freq: Every day | TRANSDERMAL | Status: DC
  Administered 2024-01-24: 21 mg via TRANSDERMAL
  Filled 2024-01-23 (×2): qty 1

## 2024-01-23 MED ORDER — ALUM & MAG HYDROXIDE-SIMETH 200-200-20 MG/5ML PO SUSP: 30.0000 mL | ORAL | Status: DC | PRN

## 2024-01-23 MED ORDER — LORAZEPAM 1 MG PO TABS
1.0000 mg | ORAL_TABLET | Freq: Two times a day (BID) | ORAL | Status: DC
  Administered 2024-01-23 – 2024-01-29 (×12): 1 mg via ORAL
  Filled 2024-01-23 (×12): qty 1

## 2024-01-23 MED ORDER — DIPHENHYDRAMINE HCL 25 MG PO CAPS: 50.0000 mg | ORAL_CAPSULE | Freq: Three times a day (TID) | ORAL | Status: DC | PRN

## 2024-01-23 MED ORDER — LORAZEPAM 2 MG/ML IJ SOLN: 2.0000 mg | Freq: Three times a day (TID) | INTRAMUSCULAR | Status: DC | PRN

## 2024-01-23 MED ORDER — CLOZAPINE 25 MG PO TABS: 25.0000 mg | ORAL_TABLET | Freq: Every day | ORAL | Status: DC

## 2024-01-23 MED ORDER — TRAZODONE HCL 100 MG PO TABS
200.0000 mg | ORAL_TABLET | Freq: Every day | ORAL | Status: DC
  Administered 2024-01-23 – 2024-01-28 (×6): 200 mg via ORAL
  Filled 2024-01-23 (×3): qty 2
  Filled 2024-01-23: qty 20
  Filled 2024-01-23 (×2): qty 2

## 2024-01-23 MED ORDER — NICOTINE POLACRILEX 2 MG MT GUM
2.0000 mg | CHEWING_GUM | OROMUCOSAL | Status: DC | PRN
Start: 2024-01-23 — End: 2024-01-29

## 2024-01-23 MED ORDER — FLUPHENAZINE HCL 5 MG PO TABS: 5.0000 mg | ORAL_TABLET | Freq: Three times a day (TID) | ORAL | Status: DC

## 2024-01-23 MED ORDER — DIPHENHYDRAMINE HCL 50 MG/ML IJ SOLN: 50.0000 mg | Freq: Three times a day (TID) | INTRAMUSCULAR | Status: DC | PRN

## 2024-01-23 MED ORDER — ACETAMINOPHEN 325 MG PO TABS
650.0000 mg | ORAL_TABLET | Freq: Four times a day (QID) | ORAL | Status: DC | PRN
  Administered 2024-01-24 – 2024-01-28 (×2): 650 mg via ORAL
  Filled 2024-01-23 (×2): qty 2

## 2024-01-23 MED ORDER — LORAZEPAM 1 MG PO TABS
1.0000 mg | ORAL_TABLET | Freq: Two times a day (BID) | ORAL | Status: DC
  Administered 2024-01-23: 1 mg via ORAL
  Filled 2024-01-23: qty 1

## 2024-01-23 MED ORDER — TRAZODONE HCL 100 MG PO TABS: 200.0000 mg | ORAL_TABLET | Freq: Every day | ORAL | Status: DC

## 2024-01-23 MED ORDER — MAGNESIUM HYDROXIDE 400 MG/5ML PO SUSP: 30.0000 mL | Freq: Every day | ORAL | Status: DC | PRN

## 2024-01-23 MED ORDER — MIRTAZAPINE 15 MG PO TBDP
15.0000 mg | ORAL_TABLET | Freq: Every day | ORAL | Status: DC
  Administered 2024-01-23 – 2024-01-28 (×6): 15 mg via ORAL
  Filled 2024-01-23 (×5): qty 1
  Filled 2024-01-23: qty 10
  Filled 2024-01-23: qty 1

## 2024-01-23 MED ORDER — HALOPERIDOL LACTATE 5 MG/ML IJ SOLN: 10.0000 mg | Freq: Three times a day (TID) | INTRAMUSCULAR | Status: DC | PRN

## 2024-01-23 MED ORDER — MIRTAZAPINE 15 MG PO TBDP: 15.0000 mg | ORAL_TABLET | Freq: Every day | ORAL | Status: DC

## 2024-01-23 MED ORDER — FLUPHENAZINE HCL 5 MG PO TABS
5.0000 mg | ORAL_TABLET | Freq: Three times a day (TID) | ORAL | Status: DC
  Administered 2024-01-23 – 2024-01-24 (×2): 5 mg via ORAL
  Filled 2024-01-23: qty 1

## 2024-01-23 MED ORDER — DIVALPROEX SODIUM 500 MG PO DR TAB
750.0000 mg | DELAYED_RELEASE_TABLET | Freq: Two times a day (BID) | ORAL | Status: DC
  Administered 2024-01-23 – 2024-01-29 (×12): 750 mg via ORAL
  Filled 2024-01-23 (×10): qty 1

## 2024-01-23 MED ORDER — DIVALPROEX SODIUM 250 MG PO DR TAB: 750.0000 mg | DELAYED_RELEASE_TABLET | Freq: Two times a day (BID) | ORAL | Status: DC

## 2024-01-23 MED ORDER — LORAZEPAM 1 MG PO TABS: 1.0000 mg | ORAL_TABLET | Freq: Two times a day (BID) | ORAL | Status: DC

## 2024-01-23 MED ORDER — FLUPHENAZINE HCL 5 MG PO TABS
5.0000 mg | ORAL_TABLET | Freq: Three times a day (TID) | ORAL | Status: DC
  Administered 2024-01-23: 5 mg via ORAL

## 2024-01-23 MED ORDER — HALOPERIDOL 5 MG PO TABS: 5.0000 mg | ORAL_TABLET | Freq: Three times a day (TID) | ORAL | Status: DC | PRN

## 2024-01-23 MED ORDER — BENZTROPINE MESYLATE 1 MG PO TABS: 1.0000 mg | ORAL_TABLET | Freq: Two times a day (BID) | ORAL | Status: DC

## 2024-01-23 MED ORDER — NICOTINE 14 MG/24HR TD PT24: 14.0000 mg | MEDICATED_PATCH | Freq: Every day | TRANSDERMAL | Status: DC

## 2024-01-23 MED ORDER — BENZTROPINE MESYLATE 1 MG PO TABS
1.0000 mg | ORAL_TABLET | Freq: Two times a day (BID) | ORAL | Status: DC
Start: 2024-01-23 — End: 2024-01-28
  Administered 2024-01-23 – 2024-01-27 (×9): 1 mg via ORAL
  Filled 2024-01-23 (×9): qty 1

## 2024-01-23 MED ORDER — HYDROXYZINE HCL 25 MG PO TABS: 25.0000 mg | ORAL_TABLET | Freq: Three times a day (TID) | ORAL | Status: DC | PRN

## 2024-01-23 MED ORDER — HALOPERIDOL LACTATE 5 MG/ML IJ SOLN: 5.0000 mg | Freq: Three times a day (TID) | INTRAMUSCULAR | Status: DC | PRN

## 2024-01-23 MED ORDER — HYDROXYZINE HCL 50 MG PO TABS
50.0000 mg | ORAL_TABLET | Freq: Three times a day (TID) | ORAL | Status: DC | PRN
  Administered 2024-01-24 – 2024-01-27 (×3): 50 mg via ORAL
  Filled 2024-01-23: qty 15
  Filled 2024-01-23 (×3): qty 1

## 2024-01-23 NOTE — Discharge Summary (Signed)
 Physician Discharge Summary Note  Patient:  William Romero is an 43 y.o., male MRN:  [id number] DOB:  May 19, 1981 Patient phone:  [phone] (home)  Patient address:   [address] [place] [facility] [address],  Total Time spent with patient: 30 minutes  Date of Admission:  01/13/2024 Date of Discharge: 01/23/24  Reason for Admission:   43 y.o. male with a past psychiatric history of schizophrenia, reported ADHD, GAD, and episodes of depression who prsented to ed yesterday with acute worsening psychosis. UDS + amphetamines and THC.     Per psychiatric evaluation in ED yesterday: " Patient says the voices are gathering information all around him and that they say "bad words" which patient states cause him to be sick.  Patient says he has stopped his medications each time he has been discharged because they don't stop the voices.  Patient states "I haven't had good treatment."  He reports that he takes adderall 20mg  Q day but that he does not have a current prescription.  In spite of a UDS positive for amphetamines; patient denies any current use of amphetamines.  Patient reports a willingness to go inpatient for treatment, but that "those places make me anxious and it takes 2 weeks to adjust when I leave."  Patient asks for "heavy duty sedatives" for his anxiety.   During evaluation William Romero is seated on his bed in no acute distress.  He is alert & oriented x 4, calm, cooperative and attentive for this assessment.  His mood is anxious with congruent affect.  He has normal speech, and behavior.  Objectively there is evidence of psychosis/mania and delusional thinking. Pt does not appear to be responding to internal or external stimuli.  Patient is able to converse coherently, goal directed thoughts, no distractibility, or pre-occupation.  He denies suicidal/self-harm/homicidal ideation and endorses psychosis and paranoia.  Patient answered questions appropriately.  "   Patient seen  at bedside and is irritable and floridly psychotic and with paranoid persecutory delusions about his family. States his "grandmother is capable of lust and rape." States he is hearing voices including his grandmothers voice and they are "saying bad things." States his family have been inserting these voices into his head for years and "it's voodoo doc you know of voodoo?" Patient asking for a "sedative" agreeable to Depakote  and Risperdal . Patient poor historian and states not medications have helped him in the past and  that it's "voodoo you need to understand voodoo." Patient irritable and states he does not want to talk anymore. Patient will not elaborate on how much Adderall he has been using stating he is not using it. Patient denies SI or HI today but notes he feels hopeless "because the voices won't go away." Denies anhedonia. Reports poor concentration and high anxiety stating he is afraid to go to sleep at night because of the voices. Patient overall is poor historian at this time and refuses to answer when asked about manic episodes.   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 Augusta Blizzard 04/13/2023, poor followup Current therapist: none. Previous reported psychiatric diagnoses:  ?ADHD, schizoaffective disorder bipolar type, GAD Current psychiatric medications: none. Psychiatric medication history/compliance: Per chart review, poor with the exception of Adderall.  Full med trials  include: adderall, bupropion, duloxetine , haloperidol , hydroxyzine  (ineffective), lorazepam , olanzapine , ziprasidone , paliperidone , risperidone ,  Psychiatric hospitalization(s): Approximately 6 stays over the past  from 2012-2019.  Most recently, patient had 3 separate stays at [facility] in 2024, 6/11 - 6/19 and 6/22 - 7/1, and on 10/2023 Psychotherapy history: None Neuromodulation history: none. History of suicide (obtained from HPI): Reported 1 attempted OD in distant past, presently denied History of homicide or aggression (obtained in HPI): none.  Past Medical History:  Past Medical History:  Diagnosis Date   Paranoid schizophrenia (HCC) 10/03/2023   Schizophrenia (HCC)     Past Surgical History:  Procedure Laterality Date   APPENDECTOMY     Family History: History reviewed. No pertinent family history. Family Psychiatric  History:  Psych:Denies family psychiatric history  Social History:  Social History   Substance and Sexual Activity  Alcohol Use Not Currently   Alcohol/week: 5.0 standard drinks of alcohol   Types: 5 Cans of beer per week     Social History   Substance and Sexual Activity  Drug Use No    Social History   Socioeconomic History   Marital status: Single    Spouse name: Not on file   Number of children: Not on file   Years of education: Not on file   Highest education level: Not on file  Occupational History   Not on file  Tobacco Use   Smoking status: Former    Current packs/day: 0.50    Types: Cigarettes   Smokeless tobacco: Never  Vaping Use   Vaping status: Never Used  Substance and Sexual Activity   Alcohol use: Not Currently    Alcohol/week: 5.0 standard drinks of alcohol    Types: 5 Cans of beer per week   Drug use: No   Sexual activity: Not Currently  Other Topics Concern   Not on file  Social History Narrative   Not on file   Social Drivers of Health   Financial Resource Strain: Not on file  Food Insecurity: No Food Insecurity (01/14/2024)   Hunger Vital Sign    Worried About Running Out of Food in the Last Year: Never true    Ran Out of Food in the Last Year: Never true  Transportation Needs: No Transportation Needs (01/14/2024)   PRAPARE -  Administrator, Civil Service (Medical): No    Lack of Transportation (Non-Medical): No  Physical Activity: Not on file  Stress: Not on file  Social Connections: Not on file    Hospital Course:   Patient was admitted to inpatient psychiatry at [facility] for safety and stabilization. Patient was provided safe and therapeutic milieu, psychiatric and medical assessment, care and treatment, as well as support from nursing, behavioral health staff. Both psychotherapy and psychoeducation groups were provided. Different coping skills such as journaling, CBT and art therapy groups were offered. Additional consultation was provided by hospitalist for H&P and medical needs.  Patient was started on Risperdal , Depakote , mirtazapine , during the admission for schizoaffective disorder bipolar type. Patient's psychotic symptoms did not improved with Risperdal  titration and he was cross-titrated to Prolixin  with mild benefit only. Patient continues to have residual AVH on day of discharge and responding to internal stimuli with paranoid persecutory delusions that all of his family are inserting AVH into his head.    Musculoskeletal: Strength & Muscle Tone: within normal limits Gait & Station: normal Patient leans: N/A   Psychiatric Specialty Exam:  Presentation  General Appearance:  Bizarre; Disheveled  Eye Contact:  Poor  Speech: Blocked  Speech Volume: Increased  Handedness: Right   Mood and Affect  Mood: Anxious; Dysphoric; Irritable; Labile  Affect: Congruent; Labile   Thought Process  Thought Processes: Disorganized  Descriptions of Associations:Tangential  Orientation:Full (Time, Place and Person)  Thought Content:Delusions; Illogical; Paranoid Ideation; Perseveration  History of Schizophrenia/Schizoaffective disorder:Yes  Duration of Psychotic Symptoms:Greater than six months  Hallucinations:No data recorded Ideas of Reference:Percusatory; Paranoia;  Delusions  Suicidal Thoughts:No data recorded Homicidal Thoughts:No data recorded  Sensorium  Memory: Immediate Fair; Remote Poor  Judgment: Poor  Insight: Poor   Executive Functions  Concentration: Poor  Attention Span: Poor  Recall: [employer] of Knowledge: Fair  Language: Fair   Psychomotor Activity  Psychomotor Activity:No data recorded  Assets  Assets: Communication Skills; Desire for Improvement; Physical Health   Sleep  Sleep:No data recorded   Physical Exam: Physical Exam ROS Blood pressure 110/83, pulse 89, temperature 97.7 F (36.5 C), temperature source Oral, resp. rate 16, height 6\' 2"  (1.88 m), weight 93.9 kg, SpO2 100%. Body mass index is 26.58 kg/m.   Social History   Tobacco Use  Smoking Status Former   Current packs/day: 0.50   Types: Cigarettes  Smokeless Tobacco Never   Tobacco Cessation:  A prescription for an FDA-approved tobacco cessation medication provided at discharge   Blood Alcohol level:  Lab Results  Component Value Date   [facility] <15 01/11/2024   ETH <10 10/03/2023    Metabolic Disorder Labs:  Lab Results  Component Value Date   HGBA1C 5.2 01/15/2024   MPG 103 01/15/2024   MPG 99.67 10/03/2023   No results found for: "PROLACTIN" Lab Results  Component Value Date   CHOL 168 01/15/2024   TRIG 172 (H) 01/15/2024   HDL 42 01/15/2024   CHOLHDL 4.0 01/15/2024   VLDL 34 01/15/2024   LDLCALC 92 01/15/2024   LDLCALC 98 10/03/2023    See Psychiatric Specialty Exam and Suicide Risk Assessment completed by Attending Physician prior to discharge.  Discharge destination:  Home  Is patient on multiple antipsychotic therapies at discharge:  No   Has Patient had three or more failed trials of antipsychotic monotherapy by history:  No  Recommended Plan for Multiple Antipsychotic Therapies: NA   Allergies as of 01/23/2024   No Known Allergies      Medication List     STOP taking these medications     amLODipine  5 MG tablet Commonly known as: NORVASC    amphetamine -dextroamphetamine  20 MG tablet Commonly known as: ADDERALL   divalproex  500 MG 24 hr tablet Commonly known as: DEPAKOTE  ER Replaced by: divalproex  250 MG DR tablet   propranolol  10 MG tablet Commonly known as: INDERAL        TAKE these medications      Indication  benztropine  1 MG tablet Commonly known as: COGENTIN  Take 1 tablet (1 mg total) by mouth every 12 (twelve) hours. What changed:  medication strength how much to take when to take this  Indication: Extrapyramidal Reaction caused by Medications   divalproex  250 MG DR tablet Commonly known as: DEPAKOTE  Take 3 tablets (750 mg total) by mouth every 12 (twelve) hours. Replaces: divalproex  500 MG 24 hr tablet  Indication: Schizophrenia   fluPHENAZine  5 MG tablet Commonly known as: PROLIXIN  Take 1 tablet (5 mg total) by mouth every 8 (eight) hours. What changed:  medication strength how much to take when to take this  Indication: Schizophrenia   hydrOXYzine  25 MG tablet Commonly known as: ATARAX  Take  1 tablet (25 mg total) by mouth 3 (three) times daily as needed for anxiety. What changed:  medication strength how much to take  Indication: Feeling Anxious   LORazepam  1 MG tablet Commonly known as: ATIVAN  Take 1 tablet (1 mg total) by mouth every 12 (twelve) hours.  Indication: Feeling Anxious   mirtazapine  15 MG disintegrating tablet Commonly known as: REMERON  SOL-TAB Take 1 tablet (15 mg total) by mouth at bedtime.  Indication: anxiety   nicotine  14 mg/24hr patch Commonly known as: NICODERM CQ  - dosed in mg/24 hours Place 1 patch (14 mg total) onto the skin daily. Start taking on: Jan 24, 2024  Indication: Nicotine  Addiction   nicotine  polacrilex 2 MG gum Commonly known as: NICORETTE  Take 1 each (2 mg total) by mouth as needed for smoking cessation.  Indication: Nicotine  Addiction   traZODone  100 MG tablet Commonly known as:  DESYREL  Take 2 tablets (200 mg total) by mouth at bedtime. What changed: how much to take  Indication: Trouble Sleeping        Follow-up Information     [facility]. Go on 01/31/2024.   Specialty: Behavioral Health Why: Please go to this provider on 01/31/24 at 7:00 am for an assessment, to receive medication management services. You may also go on Monday through Friday, arrive by 7:00 am. Contact information: [address] [place]  [address] [phone]        [facility]. Go on 01/30/2024.   Specialty: Professional Counselor Why: Please go to this provider on 01/30/24 at 9:00 am for an assessment, to receive therapy services. You may also go on Monday through Friday, from 9 am to 1 pm. Contact information: [address] [place]  [address] [place] [address] [phone]                 Follow-up recommendations:  immediate discharge requested by public defender for patient Nairi Oswald, MD 01/23/2024, 3:48 PM

## 2024-01-23 NOTE — Progress Notes (Signed)
 Scheduled Ativan  1 mg PO and Prolixin  5 mg both given at approximately 1319 as ordered for increased anxiety, irritability and +AH. Pt awake in dayroom watching TV with peers with briefly, asleep when reassessed at 1400. Respirations noted and unlabored. 1:1 observation maintained at ordered with assigned staff in attendance at all times.

## 2024-01-23 NOTE — Progress Notes (Signed)
 Pt A & O to self,place and situation. Observed with flat affect, worried and presents preoccupied with circumstantial speech related increased auditory hallucinations. Per pt "I'm still hearing voices, it keeps coming on, it's getting worse. It's making me anxious. I can't rest". Rates his anxiety 6/10, depression 5/10 and hopelessness 0/10. Noted pacing in room at intervals, talking to self. Redirectable at this time. Reports he slept fair last night "They voices are still there" with good appetite Compliant with scheduled medications, denies adverse drug reactions when assessed. Safety maintained on 1:1 observation with assigned staff in attendance at all times.

## 2024-01-23 NOTE — Progress Notes (Signed)
 Nursing 1:1 note D:Pt observed  in bed with eyes closed. RR even and unlabored. No distress noted. A: 1:1 observation continues for safety  R: pt remains safe

## 2024-01-23 NOTE — Progress Notes (Signed)
 [facility] MD Progress Note  01/23/2024 11:49 AM William Romero  MRN:  [id number]  Per chart review, vitals are stable, patient is compliant with medications on the unit.  As needed Atarax  for anxiety being used average once to twice daily.  As needed Haldol  and Benadryl  were given for irritability and restlessness 5/18 at 6 PM.  Per staff patient continues to have occasions of reporting worsening voices, wandering around the unit requiring redirection, on one-to-one precaution which will continue.  Subjective:   Patient was assessed in his room today, he presents to me today reporting voices are ongoing and they are getting worse. Patient states his AH have not improved since admission and he wants to try Clozapine which I had told him about previously. Patient poor historian but states he has tried Risperdal  and Haldol  previously and they were ineffective. Tells me "I saw a Dr. Lugon in the 90's for psychiatric" but otherwise unable to elaborate on dosing or duration. Patient is restless and had been entering other patient's room requiring 1:1 due to command AH telling him to enter other peoples rooms.  ON discussion with nursing staff today he is more redirectable and will complete trial without 1:1. Denies SI or HI.  Denies EPS or TD reports better sleep last night and fair appetite. Agreeable to both Prolixin  increase and starting Clozapine at night although patient would like to start Clozapine trial as reports no benefit from Risperdal , Haldol  or Prolixin . Patient remains irritable with paranoid persecutory delusions about all his family and refuses for me to contact any of his family stating they did voodoo on him to cause these voices to be inserted into his head.  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 Augusta Blizzard 04/13/2023, poor followup Current therapist: none. Previous reported psychiatric diagnoses:  ?ADHD, schizoaffective disorder bipolar type, GAD Current psychiatric medications: none. Psychiatric medication history/compliance: Per chart review, poor with the exception of Adderall.  Full med trials include: adderall, bupropion, duloxetine , haloperidol , hydroxyzine  (ineffective), lorazepam , olanzapine , ziprasidone , paliperidone , risperidone ,  Psychiatric hospitalization(s): Approximately 6 stays over the past from 2012-2019.  Most recently, patient had 3 separate stays at [facility] in 2024, 6/11 - 6/19 and 6/22 - 7/1, and on 10/2023 Psychotherapy history: None Neuromodulation history: none. History of suicide (obtained from HPI): Reported 1 attempted OD in distant past, presently denied History of homicide or aggression (obtained in HPI): none.  Past Medical History:  Past Medical History:  Diagnosis Date   Paranoid schizophrenia (HCC) 10/03/2023   Schizophrenia (HCC)     Past Surgical History:  Procedure Laterality Date   APPENDECTOMY     Family History: History reviewed. No pertinent family history. Family Psychiatric  History:  Denies family psychiatric history  Social History:  Social History   Substance and Sexual Activity  Alcohol Use Not Currently   Alcohol/week: 5.0 standard drinks of alcohol   Types: 5 Cans of beer per week     Social History   Substance and Sexual Activity  Drug Use No    Social History   Socioeconomic History   Marital status: Single    Spouse name: Not on file   Number of children: Not on file   Years of education:  Not on file   Highest education level: Not on file  Occupational History   Not on file  Tobacco Use   Smoking status: Former    Current packs/day: 0.50    Types: Cigarettes    Smokeless tobacco: Never  Vaping Use   Vaping status: Never Used  Substance and Sexual Activity   Alcohol use: Not Currently    Alcohol/week: 5.0 standard drinks of alcohol    Types: 5 Cans of beer per week   Drug use: No   Sexual activity: Not Currently  Other Topics Concern   Not on file  Social History Narrative   Not on file   Social Drivers of Health   Financial Resource Strain: Not on file  Food Insecurity: No Food Insecurity (01/14/2024)   Hunger Vital Sign    Worried About Running Out of Food in the Last Year: Never true    Ran Out of Food in the Last Year: Never true  Transportation Needs: No Transportation Needs (01/14/2024)   PRAPARE - Administrator, Civil Service (Medical): No    Lack of Transportation (Non-Medical): No  Physical Activity: Not on file  Stress: Not on file  Social Connections: Not on file   Additional Social History:                         Sleep: Fair  Appetite:  Fair  Current Medications: Current Facility-Administered Medications  Medication Dose Route Frequency Provider Last Rate Last Admin   acetaminophen  (TYLENOL ) tablet 650 mg  650 mg Oral Q6H PRN Onuoha, Josephine C, NP   650 mg at 01/18/24 0948   alum & mag hydroxide-simeth (MAALOX/MYLANTA) 200-200-20 MG/5ML suspension 30 mL  30 mL Oral Q4H PRN Onuoha, Josephine C, NP       benztropine  (COGENTIN ) tablet 1 mg  1 mg Oral Q12H Plez Belton, MD   1 mg at 01/23/24 0836   cloZAPine (CLOZARIL) tablet 25 mg  25 mg Oral QHS Almee Pelphrey, MD       haloperidol  (HALDOL ) tablet 5 mg  5 mg Oral TID PRN Onuoha, Josephine C, NP   5 mg at 01/21/24 1819   And   diphenhydrAMINE  (BENADRYL ) capsule 50 mg  50 mg Oral TID PRN Onuoha, Josephine C, NP   50 mg at 01/21/24 1819   haloperidol  lactate (HALDOL ) injection 5 mg  5 mg Intramuscular TID PRN Onuoha, Josephine C, NP   5 mg at 01/17/24 1539   And   diphenhydrAMINE  (BENADRYL ) injection 50 mg  50 mg Intramuscular TID PRN Onuoha,  Josephine C, NP       And   LORazepam  (ATIVAN ) injection 2 mg  2 mg Intramuscular TID PRN Onuoha, Josephine C, NP       haloperidol  lactate (HALDOL ) injection 10 mg  10 mg Intramuscular TID PRN Onuoha, Josephine C, NP       And   diphenhydrAMINE  (BENADRYL ) injection 50 mg  50 mg Intramuscular TID PRN Onuoha, Josephine C, NP   50 mg at 01/17/24 1540   And   LORazepam  (ATIVAN ) injection 2 mg  2 mg Intramuscular TID PRN Onuoha, Josephine C, NP   2 mg at 01/17/24 1539   divalproex  (DEPAKOTE ) DR tablet 750 mg  750 mg Oral Q12H Attiah, Nadir, MD   750 mg at 01/23/24 0836   fluPHENAZine  (PROLIXIN ) tablet 5 mg  5 mg Oral Q8H Kaladin Noseworthy, MD       hydrOXYzine  (ATARAX )  tablet 25 mg  25 mg Oral TID PRN Bobbitt, Shalon E, NP   25 mg at 01/23/24 0454   LORazepam  (ATIVAN ) tablet 1 mg  1 mg Oral Q12H Mayeli Bornhorst, MD       mirtazapine  (REMERON  SOL-TAB) disintegrating tablet 15 mg  15 mg Oral QHS Onuoha, Josephine C, NP   15 mg at 01/22/24 2056   nicotine  (NICODERM CQ  - dosed in mg/24 hours) patch 14 mg  14 mg Transdermal Daily Ntuen, Tina C, FNP   14 mg at 01/23/24 0837   nicotine  polacrilex (NICORETTE ) gum 2 mg  2 mg Oral PRN Jaskarn Schweer, MD   2 mg at 01/16/24 1706   traZODone  (DESYREL ) tablet 200 mg  200 mg Oral QHS Attiah, Nadir, MD   200 mg at 01/22/24 2056    Lab Results:  Results for orders placed or performed during the hospital encounter of 01/13/24 (from the past 48 hours)  Valproic acid  level     Status: None   Collection Time: 01/23/24  6:20 AM  Result Value Ref Range   Valproic Acid  Lvl 83 50 - 100 ug/mL    Comment: Performed at [facility], [address] [place], [place] [address]  Hepatic function panel     Status: None   Collection Time: 01/23/24  6:20 AM  Result Value Ref Range   Total Protein 6.9 6.5 - 8.1 g/dL   Albumin 3.6 3.5 - 5.0 g/dL   AST 21 15 - 41 U/L   ALT 19 0 - 44 U/L   Alkaline Phosphatase 64 38 - 126 U/L   Total Bilirubin 0.5 0.0 - 1.2 mg/dL    Bilirubin, Direct <6.0 0.0 - 0.2 mg/dL    Comment: REPEATED TO VERIFY   Indirect Bilirubin NOT CALCULATED 0.3 - 0.9 mg/dL    Comment: Performed at [facility], [address] [place], [place] [address]      Blood Alcohol level:  Lab Results  Component Value Date   [facility] <15 01/11/2024   ETH <10 10/03/2023    Metabolic Disorder Labs: Lab Results  Component Value Date   HGBA1C 5.2 01/15/2024   MPG 103 01/15/2024   MPG 99.67 10/03/2023   No results found for: "PROLACTIN" Lab Results  Component Value Date   CHOL 168 01/15/2024   TRIG 172 (H) 01/15/2024   HDL 42 01/15/2024   CHOLHDL 4.0 01/15/2024   VLDL 34 01/15/2024   LDLCALC 92 01/15/2024   LDLCALC 98 10/03/2023    Physical Findings: AIMS:  , ,  ,  ,    CIWA:    COWS:     Musculoskeletal: Strength & Muscle Tone: within normal limits Gait & Station: normal Patient leans: N/A  Psychiatric Specialty Exam:  Presentation  General Appearance:  Bizarre; Disheveled  Eye Contact: Improved  Speech: Improved, more linear  Speech Volume: Normal, not increased  Handedness: Right   Mood and Affect  Mood: Dysphoric, less anxious and less irritable  Affect: Congruent   Thought Process  Thought Processes: Less disorganized  Descriptions of Associations: Less tangential more concrete  Orientation:Full (Time, Place and Person)  Thought Content: Less disorganized and delusional, improved auditory hallucinations  History of Schizophrenia/Schizoaffective disorder:Yes  Duration of Psychotic Symptoms:Greater than six months  Hallucinations:No data recorded Decreased auditory hallucinations Ideas of Reference:Decreased to paranoia Suicidal Thoughts:No data recorded Denies passive or active SI Homicidal Thoughts:No data recorded Denies HI  Sensorium  Memory: Immediate Fair; Remote Poor  Judgment: Limited  Insight: Limited   Executive Functions  Concentration: Improved  yet limited  Attention Span: Improved yet limited  Recall: [employer] of Knowledge: Fair  Language: Fair   Psychomotor Activity  Psychomotor Activity: No data recorded   Assets  Assets: Communication Skills; Desire for Improvement; Physical Health   Sleep  Sleep: No data recorded    Physical Exam: Physical exam: Please see exam on admit note. General: Well developed, well nourished.  Pupils: Normal at 3mm Respiratory: Breathing is unlabored.  Cardiovascular: No edema.  Language: No anomia, no aphasia Muscle strength and tone-pt moving all extremities.  Gait not assessed as pt remained in bed.  Neuro: Facial muscles are symmetric. Pt without tremor, no evidence of hyperarousal.  Review of Systems  Constitutional: Negative.   HENT: Negative.    Eyes: Negative.   Respiratory: Negative.    Cardiovascular: Negative.   Gastrointestinal: Negative.   Genitourinary: Negative.   Musculoskeletal: Negative.   Skin: Negative.   Neurological: Negative.   Endo/Heme/Allergies: Negative.   Psychiatric/Behavioral:  Positive for hallucinations and substance abuse. The patient is nervous/anxious.    Blood pressure 110/83, pulse 89, temperature 97.7 F (36.5 C), temperature source Oral, resp. rate 16, height 6\' 2"  (1.88 m), weight 93.9 kg, SpO2 100%. Body mass index is 26.58 kg/m.   Treatment Plan Summary: Daily contact with patient to assess and evaluate symptoms and progress in treatment, Medication management, and Plan as noted below  5/16 patient seems to have some improvement in his psychosis with current treatment regimen since titrated risperidone  from 2 to 3 mg twice daily, will follow.  5/20: Patient poor historian but states he has tried Risperdal  and Haldol  previously and they were ineffective. Tells me "I saw a Dr. Lugon in the 90's for psychiatric" but otherwise unable to elaborate on dosing or duration. Patient remains highly anxious, irritable, restless and  had been entering other patient's room requiring 1:1 due to command AH telling him to enter other peoples rooms. NO EPS or akathisia on evaluation.    Treatment Plan Summary: Daily contact with patient to assess and evaluate symptoms and progress in treatment, Medication management, and Plan     Observation Level/Precautions:  15 minute checks  Laboratory:  CBC Chemistry Profile Folic Acid HbAIC HCG UDS UA Vitamin B-12  Psychotherapy:  denies  Medications:  olanzapine , Cogentin   Consultations:    Discharge Concerns:  stabilization of psychosis and LAI  Estimated LOS: 5-7 days  Other:      Safety and Monitoring: INVOLUTARILY (completed second exam) admission to inpatient psychiatric unit for safety, stabilization and treatment Daily contact with patient to assess and evaluate symptoms and progress in treatment Patient's case to be discussed in multi-disciplinary team meeting Observation Level : q15 minute checks Vital signs: q12 hours Precautions: suicide, elopement, and assault   2. Psychiatric Problems #schizoaffective disorder bipolar type - 5/16 Depakote  was titrated to 750 mg twice daily and obtain another Depakote  level on 5/20 Depakote  level low therapeutic 47 on 5/15  -cont cogentin  1 mg BID for EPS prophylaxis - 5/17: Titrate Prolixin  to 5 mg TID daily to address psychosis   On my review of documentation patient with dubious response to Prolixin  during previous hospitalization we will titrate Prolixin  to 5 mg 3 times daily and if helpful will consider using Prolixin  LAI due to history of poor compliance and followup; patient requesting to try Clozapine as states he has not noticed any benefit from trials of Risperdal , Haldol , Invega  on prior hospitalizations  in decreasing his AH which are intrusive and bothersome and have led to agitation throughout hospitalization  - discussed with inpatient pharmacist, ordered baseline CBC with diff prior to starting Clozapine will  repeat in 4-7 days.  - restart Ativan  1 mg q12h  for high anxiety, irritability and mood lability with last dose on  5/18 - Continue trazodone  200 mg at bedtime scheduled for insomnia      3. Medical Management Covid negative CMP: wnl,calcium 8.5 CBC: unremarkable EtOH: <10 UDS: + amphetamine , +THC TSH: ordered A1C:  ordered, pending Lipids: triglycerides elevated otherwise wnl Depakote  level 5/15 low therapeutic 47 Depakote  level due 5/20   #HTN -cont amlodipine  5 mg qdaily   Physician Treatment Plan for Primary Diagnosis: Schizophrenia, unspecified (HCC) Long Term Goal(s): Improvement in symptoms so as ready for discharge   Short Term Goals: Ability to identify changes in lifestyle to reduce recurrence of condition will improve, Ability to verbalize feelings will improve, Ability to disclose and discuss suicidal ideas, Ability to demonstrate self-control will improve, Ability to identify and develop effective coping behaviors will improve, Ability to maintain clinical measurements within normal limits will improve, Compliance with prescribed medications will improve, and Ability to identify triggers associated with substance abuse/mental health issues will improve   Physician Treatment Plan for Secondary Diagnosis: Principal Problem:   Schizophrenia, unspecified (HCC)       I certify that inpatient services furnished can reasonably be expected to improve the patient's condition.      Genise Strack, MD 01/23/2024, 11:49 AM

## 2024-01-23 NOTE — BHH Suicide Risk Assessment (Signed)
 [facility] Discharge Suicide Risk Assessment   Principal Problem: Schizoaffective disorder, bipolar type (HCC) Discharge Diagnoses: Principal Problem:   Schizoaffective disorder, bipolar type (HCC) Active Problems:   Stimulant dependence (HCC)   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)   Total Time spent with patient: 30 minutes  Musculoskeletal: Strength & Muscle Tone: within normal limits Gait & Station: normal Patient leans: N/A  Psychiatric Specialty Exam  Presentation  General Appearance:  Bizarre; Disheveled  Eye Contact: Poor  Speech: Blocked  Speech Volume: Increased  Handedness: Right   Mood and Affect  Mood: Anxious; Dysphoric; Irritable; Labile  Duration of Depression Symptoms: Greater than two weeks  Affect: Congruent; Labile   Thought Process  Thought Processes: Disorganized  Descriptions of Associations:Tangential  Orientation:Full (Time, Place and Person)  Thought Content:Delusions; Illogical; Paranoid Ideation; Perseveration  History of Schizophrenia/Schizoaffective disorder:Yes  Duration of Psychotic Symptoms:Greater than six months  Hallucinations:No data recorded Ideas of Reference:Percusatory; Paranoia; Delusions  Suicidal Thoughts:No data recorded Homicidal Thoughts:No data recorded  Sensorium  Memory: Immediate Fair; Remote Poor  Judgment: Poor  Insight: Poor   Executive Functions  Concentration: Poor  Attention Span: Poor  Recall: [employer] of Knowledge: Fair  Language: Fair   Psychomotor Activity  Psychomotor Activity:No data recorded  Assets  Assets: Communication Skills; Desire for Improvement; Physical Health   Sleep  Sleep:No data recorded  Physical Exam: Physical Exam ROS Blood pressure 110/83, pulse 89, temperature 97.7 F (36.5 C), temperature source Oral, resp. rate 16, height 6\' 2"  (1.88 m), weight 93.9 kg, SpO2 100%. Body mass index is 26.58  kg/m.  Mental Status Per Nursing Assessment::   On Admission:  NA  Demographic Factors:  Male, Caucasian, Low socioeconomic status, Living alone, and Unemployed  Loss Factors: Decrease in vocational status and Financial problems/change in socioeconomic status  Historical Factors: Family history of mental illness or substance abuse and Impulsivity  Risk Reduction Factors:   NA  Continued Clinical Symptoms:  Schizophrenia:   Paranoid or undifferentiated type More than one psychiatric diagnosis Unstable or Poor Therapeutic Relationship Previous Psychiatric Diagnoses and Treatments  Cognitive Features That Contribute To Risk:  Polarized thinking    Suicide Risk:  Moderate:  Frequent suicidal ideation with limited intensity, and duration, some specificity in terms of plans, no associated intent, good self-control, limited dysphoria/symptomatology, some risk factors present, and identifiable protective factors, including available and accessible social support.   Follow-up Information     [facility] Go on 01/31/2024.   Specialty: Behavioral Health Why: Please go to this provider on 01/31/24 at 7:00 am for an assessment, to receive medication management services. You may also go on Monday through Friday, arrive by 7:00 am. Contact information: [address] [place]  [address] [phone]        [facility] Go on 01/30/2024.   Specialty: Professional Counselor Why: Please go to this provider on 01/30/24 at 9:00 am for an assessment, to receive therapy services. You may also go on Monday through Friday, from 9 am to 1 pm. Contact information: [address] [place]  [address] [place] [address] [phone]                 Plan Of Care/Follow-up recommendations:  Other:     Andren Bethea, MD 01/23/2024, 3:43 PM

## 2024-01-23 NOTE — BHH Suicide Risk Assessment (Signed)
 [facility] Admission Suicide Risk Assessment   Nursing information obtained from:    Demographic factors:  Male, Unemployed, Low socioeconomic status, Caucasian Current Mental Status:  NA Loss Factors:  Financial problems / change in socioeconomic status Historical Factors:  Family history of mental illness or substance abuse, Impulsivity Risk Reduction Factors:  NA  Total Time spent with patient: 30 minutes Principal Problem: Schizophrenia (HCC) Diagnosis:  Principal Problem:   Schizophrenia (HCC)  Subjective Data: see H&P  Continued Clinical Symptoms:    The "Alcohol Use Disorders Identification Test", Guidelines for Use in Primary Care, Second Edition.  World Science writer [facility]). Score between 0-7:  no or low risk or alcohol related problems. Score between 8-15:  moderate risk of alcohol related problems. Score between 16-19:  high risk of alcohol related problems. Score 20 or above:  warrants further diagnostic evaluation for alcohol dependence and treatment.   CLINICAL FACTORS:   Severe Anxiety and/or Agitation Schizophrenia:   Depressive state Paranoid or undifferentiated type Currently Psychotic Unstable or Poor Therapeutic Relationship Previous Psychiatric Diagnoses and Treatments Medical Diagnoses and Treatments/Surgeries   Musculoskeletal: Strength & Muscle Tone: within normal limits Gait & Station: normal Patient leans: N/A  Psychiatric Specialty Exam:  Presentation  General Appearance:  Bizarre; Disheveled  Eye Contact: Fleeting  Speech: Blocked  Speech Volume: Increased  Handedness: Right   Mood and Affect  Mood: Irritable  Affect: Congruent; Labile   Thought Process  Thought Processes: Disorganized  Descriptions of Associations:Circumstantial  Orientation:Full (Time, Place and Person)  Thought Content:Delusions; Illogical; Perseveration; Paranoid Ideation  History of Schizophrenia/Schizoaffective disorder:Yes  Duration of  Psychotic Symptoms:Greater than six months  Hallucinations:Hallucinations: Auditory Description of Auditory Hallucinations: "sexual things"  Ideas of Reference:Paranoia; Delusions; Percusatory  Suicidal Thoughts:Suicidal Thoughts: No  Homicidal Thoughts:Homicidal Thoughts: No   Sensorium  Memory: Immediate Fair  Judgment: Poor  Insight: Poor   Executive Functions  Concentration: Fair  Attention Span: Fair  Recall: Fair  Fund of Knowledge: Fair  Language: Fair   Psychomotor Activity  Psychomotor Activity: Psychomotor Activity: Normal   Assets  Assets: Communication Skills   Sleep  Sleep: Sleep: Poor    Physical Exam: Physical Exam ROS There were no vitals taken for this visit. There is no height or weight on file to calculate BMI.   COGNITIVE FEATURES THAT CONTRIBUTE TO RISK:  Closed-mindedness and Polarized thinking    SUICIDE RISK:   Moderate:  Frequent suicidal ideation with limited intensity, and duration, some specificity in terms of plans, no associated intent, good self-control, limited dysphoria/symptomatology, some risk factors present, and identifiable protective factors, including available and accessible social support.  PLAN OF CARE: admit to inpatient psychiatry  I certify that inpatient services furnished can reasonably be expected to improve the patient's condition.   Stacy Deshler, MD 01/23/2024, 6:02 PM

## 2024-01-23 NOTE — Progress Notes (Signed)
 Pt visible in milieu at intervals. Continues to requires verbal redirections for multiple demands for medications at frequent intervals. Tolerates meals and fluids well. 1:1 observation maintained as ordered for safety with assigned MHT staff in attendance. Pt is less intrusive,verbally redirectable from not going into peer's room at this time. Safety maintained in milieu.

## 2024-01-23 NOTE — Group Note (Signed)
 Date:  01/23/2024 Time:  1:24 PM  Group Topic/Focus:  Goals Group:   The focus of this group is to help patients establish daily goals to achieve during treatment and discuss how the patient can incorporate goal setting into their daily lives to aide in recovery.    Participation Level:  Did Not Attend  William Romero 01/23/2024, 1:24 PM

## 2024-01-23 NOTE — Progress Notes (Signed)
 D: Pt A & O X 3. Denies SI, HI, AVH and pain at this time. Pt D/C as ordered with plan to readmit.  Pt receptive to care. Pt compliant with medications when offered. Denies adverse drug reactions when assessed. Verbalized understanding related to d/c instructions. Ambulatory with a steady gait. Appears to be in no physical distress at this time.

## 2024-01-23 NOTE — Plan of Care (Addendum)
 Care plan completed

## 2024-01-23 NOTE — Plan of Care (Signed)
   Problem: Education: Goal: Emotional status will improve Outcome: Progressing Goal: Mental status will improve Outcome: Progressing   Problem: Activity: Goal: Sleeping patterns will improve Outcome: Progressing

## 2024-01-23 NOTE — Plan of Care (Addendum)
 Pt presents with flat affect and anxious mood. Denies SI, HI, and AVH. Cooperative in interactions with staff. Pt remains isolative and minimal, and did not attend evening group. Medication compliant with no adverse reactions. Safety checks maintained at q 15 minutes.   Problem: Education: Goal: Emotional status will improve Outcome: Progressing Goal: Mental status will improve Outcome: Progressing Goal: Verbalization of understanding the information provided will improve Outcome: Progressing   Problem: Safety: Goal: Periods of time without injury will increase Outcome: Progressing

## 2024-01-23 NOTE — Progress Notes (Signed)
   01/23/24 2115  Psych Admission Type (Psych Patients Only)  Admission Status Involuntary  Psychosocial Assessment  Patient Complaints None  Eye Contact Fair  Facial Expression Flat  Affect Appropriate to circumstance  Speech Slow  Interaction Assertive  Motor Activity Slow  Appearance/Hygiene Disheveled  Behavior Characteristics Cooperative  Mood Anxious;Preoccupied  Aggressive Behavior  Effect No apparent injury  Thought Process  Coherency WDL  Content Preoccupation  Delusions None reported or observed  Perception Hallucinations  Hallucination Auditory  Judgment Impaired  Confusion WDL  Danger to Self  Current suicidal ideation? Denies  Danger to Others  Danger to Others None reported or observed

## 2024-01-23 NOTE — [facility] Group Notes (Signed)
 [facility] Group Notes:  (Nursing/MHT/Case Management/Adjunct)  Date:  01/23/2024  Time:  8:37 PM  Type of Therapy:  Group Therapy  Participation Level:  Active  Participation Quality:  Appropriate and Attentive  Affect:  Appropriate  Cognitive:  Alert and Appropriate  Insight:  Appropriate  Engagement in Group:  Engaged  Modes of Intervention:  Discussion  Summary of Progress/Problems:  William Romero 01/23/2024, 8:37 PM

## 2024-01-23 NOTE — H&P (Signed)
 Psychiatric Admission Assessment Adult  Patient Identification: William Romero MRN:  [id number] Date of Evaluation:  01/23/2024 Chief Complaint:  "The voices are really bothering me and won't go away" Principal Diagnosis: Schizophrenia (HCC) Diagnosis:  Principal Problem:   Schizophrenia (HCC)   History of Present Illness:  43 y.o. male with a past psychiatric history of schizophrenia, reported ADHD with history of concern for stimulant dependence, GAD, and episodes of depression who prsented to ed yesterday with acute worsening psychosis. UDS + amphetamines and THC. Reviewed PDMP and patient with monthly fills of Adderall despite previous recommendations for cessation after inpatient psychiatric admission.    Patient seen for readmission in evening due to continued psychosis, agitation and intrusive behaviors. Patient requiring continued redirection and 1:1. Reports intrusive AH of multiple voices of a derogatory and sexual nature and states they are saying "sexual things... it's voodoo from my grandmother." Denies to be they are command in nature at this time however patient is irritable and wishes not to discuss. Patient reports that the Ativan  was helping his anxiety and is interested in restarting but has not felt any antipsychotic to be helpful. Patient is floridly psychotic seen responding to internal stimuli and with delayed responses to due to thought blocking. Patand with paranoid persecutory delusions about his family. States his "grandmother is capable of lust and rape."  Patient denies SI or HI although admits the voices are bothersome and difficult to deal with.  Patient poor historian and states not medications have helped him in the past and  that it's "voodoo you need to understand voodoo." Patient irritable and states he does not want to talk anymore. Patient will not elaborate on how much Adderall he has been using stating he is not using it. Patient denies SI or HI today but  notes he feels hopeless "because the voices won't go away." Denies anhedonia. Reports poor concentration and high anxiety stating he is afraid to go to sleep at night because of the voices. Patient overall is poor historian at this time and refuses to answer when asked about manic episodes.   Patient adamantly refuses for me to speak to his mother for collateral William Romero) or any other family members stating "They've turned against me with voodoo, it's been going on for years, you don't understand."     Associated Signs/Symptoms: Depression Symptoms:  patient denies Duration of Depression Symptoms: Greater than two weeks  (Hypo) Manic Symptoms:  patient denies Anxiety Symptoms:  Excessive Worry, Psychotic Symptoms:  Delusions, Hallucinations: Auditory Ideas of Reference, Paranoia, PTSD Symptoms: Negative Total Time spent with patient: 45 minutes  Past Psychiatric Hx: Current psychiatrist: Previously seen by Dr. Augusta Romero 04/13/2023, poor followup Current therapist: none. Previous reported psychiatric diagnoses:  ?ADHD, schizoaffective disorder bipolar type, GAD Current psychiatric medications: none. Psychiatric medication history/compliance: Per chart review, poor with the exception of Adderall.  Full med trials include: adderall, bupropion, duloxetine , haloperidol , hydroxyzine  (ineffective), lorazepam , olanzapine , ziprasidone , paliperidone , risperidone ,  Psychiatric hospitalization(s): Approximately 6 stays over the past from 2012-2019.  Most recently, patient had 3 separate stays at [facility] in 2024, 6/11 - 6/19 and 6/22 - 7/1, and on 10/2023 Psychotherapy history: None Neuromodulation history: none. History of suicide (obtained from HPI): Reported 1 attempted OD in distant past, presently denied History of homicide or aggression (obtained in HPI): none.  Substance Abuse Hx: Alcohol: Intermittent and reports drinking socailly, BAL was negativePatient has had alcohol use disorder in  the past per chart documenation Tobacco: Former Cannabis: Daily use,  UDS positive for THC.  Patient says that he takes marijuana for sleep  IV drug use: Denied Prescription drug use: Adderall, last prescription filled 1/17. Other illicit drugs: Patient urine positive for amphetamines, has been prescribed Adderall 20 mg twice daily.  Of note, patient Adderall use likely contributed to previous presentations for paranoia/psychosis in the past -- per outpatient progress note with Dr. Leia Romero, it appears that patient was very insistent on being put back on Adderall. Rehab history: Has previously lived at [facility]  Past Medical History: Medical Diagnoses:HTN  Family History: Psych:Denies family psychiatric history     Social History:   Is the patient at risk to self? Yes.    Has the patient been a risk to self in the past 6 months? Yes.    Has the patient been a risk to self within the distant past? Yes.    Is the patient a risk to others? No.  Has the patient been a risk to others in the past 6 months? No.  Has the patient been a risk to others within the distant past? No.    Alcohol Screening:    Substance Abuse History in the last 12 months:  No. Consequences of Substance Abuse: Negative Previous Psychotropic Medications: Yes  Psychological Evaluations: Yes  Past Medical History:  Past Medical History:  Diagnosis Date   Paranoid schizophrenia (HCC) 10/03/2023   Schizophrenia (HCC)     Past Surgical History:  Procedure Laterality Date   APPENDECTOMY     Family History: No family history on file. Family Psychiatric  History: see HPI Tobacco Screening:   Social History:  Social History   Substance and Sexual Activity  Alcohol Use Not Currently   Alcohol/week: 5.0 standard drinks of alcohol   Types: 5 Cans of beer per week     Social History   Substance and Sexual Activity  Drug Use No    Additional Social History:                            Allergies:  No Known Allergies Lab Results:  Results for orders placed or performed during the hospital encounter of 01/13/24 (from the past 48 hours)  Valproic acid  level     Status: None   Collection Time: 01/23/24  6:20 AM  Result Value Ref Range   Valproic Acid  Lvl 83 50 - 100 ug/mL    Comment: Performed at [facility], [address], [place], [place] [address]  Hepatic function panel     Status: None   Collection Time: 01/23/24  6:20 AM  Result Value Ref Range   Total Protein 6.9 6.5 - 8.1 g/dL   Albumin 3.6 3.5 - 5.0 g/dL   AST 21 15 - 41 U/L   ALT 19 0 - 44 U/L   Alkaline Phosphatase 64 38 - 126 U/L   Total Bilirubin 0.5 0.0 - 1.2 mg/dL   Bilirubin, Direct <6.5 0.0 - 0.2 mg/dL    Comment: REPEATED TO VERIFY   Indirect Bilirubin NOT CALCULATED 0.3 - 0.9 mg/dL    Comment: Performed at [facility], [address], [place], [place] [address]    Blood Alcohol level:  Lab Results  Component Value Date   [facility] <15 01/11/2024   ETH <10 10/03/2023    Metabolic Disorder Labs:  Lab Results  Component Value Date   HGBA1C 5.2 01/15/2024   MPG 103 01/15/2024   MPG 99.67  10/03/2023   No results found for: "PROLACTIN" Lab Results  Component Value Date   CHOL 168 01/15/2024   TRIG 172 (H) 01/15/2024   HDL 42 01/15/2024   CHOLHDL 4.0 01/15/2024   VLDL 34 01/15/2024   LDLCALC 92 01/15/2024   LDLCALC 98 10/03/2023    Current Medications: Current Facility-Administered Medications  Medication Dose Route Frequency Provider Last Rate Last Admin   acetaminophen  (TYLENOL ) tablet 650 mg  650 mg Oral Q6H PRN Akila Batta, MD       alum & mag hydroxide-simeth (MAALOX/MYLANTA) 200-200-20 MG/5ML suspension 30 mL  30 mL Oral Q4H PRN Mackensey Bolte, MD       benztropine  (COGENTIN ) tablet 1 mg  1 mg Oral Q12H Carlisle Enke, MD       haloperidol  (HALDOL ) tablet 5 mg  5 mg Oral TID PRN Mairlyn Tegtmeyer, MD       And   diphenhydrAMINE  (BENADRYL )  capsule 50 mg  50 mg Oral TID PRN Jenella Craigie, MD       haloperidol  lactate (HALDOL ) injection 5 mg  5 mg Intramuscular TID PRN Donja Tipping, MD       And   diphenhydrAMINE  (BENADRYL ) injection 50 mg  50 mg Intramuscular TID PRN Bellami Farrelly, MD       And   LORazepam  (ATIVAN ) injection 2 mg  2 mg Intramuscular TID PRN Kristin Barcus, MD       haloperidol  lactate (HALDOL ) injection 10 mg  10 mg Intramuscular TID PRN Trayshawn Durkin, MD       And   diphenhydrAMINE  (BENADRYL ) injection 50 mg  50 mg Intramuscular TID PRN Yazmyne Sara, MD       And   LORazepam  (ATIVAN ) injection 2 mg  2 mg Intramuscular TID PRN Gaetan Spieker, MD       divalproex  (DEPAKOTE ) DR tablet 750 mg  750 mg Oral Q12H Mikhia Dusek, MD       fluPHENAZine  (PROLIXIN ) tablet 5 mg  5 mg Oral Q8H Pratham Cassatt, MD       hydrOXYzine  (ATARAX ) tablet 50 mg  50 mg Oral TID PRN Greyson Riccardi, MD       LORazepam  (ATIVAN ) tablet 1 mg  1 mg Oral Q12H Teja Judice, MD       magnesium  hydroxide (MILK OF MAGNESIA) suspension 30 mL  30 mL Oral Daily PRN Delaney Schnick, MD       mirtazapine  (REMERON  SOL-TAB) disintegrating tablet 15 mg  15 mg Oral QHS Keiasha Diep, MD       [START ON 01/24/2024] nicotine  (NICODERM CQ  - dosed in mg/24 hours) patch 21 mg  21 mg Transdermal Q0600 Towana Stenglein, MD       traZODone  (DESYREL ) tablet 200 mg  200 mg Oral QHS Grethel Zenk, MD       PTA Medications: Medications Prior to Admission  Medication Sig Dispense Refill Last Dose/Taking   benztropine  (COGENTIN ) 1 MG tablet Take 1 tablet (1 mg total) by mouth every 12 (twelve) hours.      divalproex  (DEPAKOTE ) 250 MG DR tablet Take 3 tablets (750 mg total) by mouth every 12 (twelve) hours.      fluPHENAZine  (PROLIXIN ) 5 MG tablet Take 1 tablet (5 mg total) by mouth every 8 (eight) hours.      hydrOXYzine  (ATARAX ) 25 MG tablet Take 1 tablet (25 mg total) by mouth 3 (three) times daily as needed for anxiety.      LORazepam  (ATIVAN ) 1 MG tablet Take 1 tablet  (1 mg  total) by mouth every 12 (twelve) hours.      mirtazapine  (REMERON  SOL-TAB) 15 MG disintegrating tablet Take 1 tablet (15 mg total) by mouth at bedtime.      [START ON 01/24/2024] nicotine  (NICODERM CQ  - DOSED IN MG/24 HOURS) 14 mg/24hr patch Place 1 patch (14 mg total) onto the skin daily.      nicotine  polacrilex (NICORETTE ) 2 MG gum Take 1 each (2 mg total) by mouth as needed for smoking cessation.      traZODone  (DESYREL ) 100 MG tablet Take 2 tablets (200 mg total) by mouth at bedtime.       Musculoskeletal: Strength & Muscle Tone: within normal limits Gait & Station: normal Patient leans: N/A    Psychiatric Specialty Exam:  Presentation  General Appearance: Bizarre; Disheveled   Eye Contact:Fleeting   Speech:Blocked   Speech Volume:Increased   Handedness:Right   Mood and Affect  Mood:Irritable   Affect:Congruent; Labile    Thought Process  Thought Processes:Disorganized   Duration of Psychotic Symptoms: Greater than six months  Past Diagnosis of Schizophrenia or Psychoactive disorder: Yes  Descriptions of Associations:Circumstantial   Orientation:Full (Time, Place and Person)   Thought Content:Delusions; Illogical; Perseveration; Paranoid Ideation   Hallucinations:Hallucinations: Auditory Description of Auditory Hallucinations: "sexual things"   Ideas of Reference:Paranoia; Delusions; Percusatory   Suicidal Thoughts:Suicidal Thoughts: No   Homicidal Thoughts:Homicidal Thoughts: No    Sensorium  Memory:Immediate Fair   Judgment:Poor   Insight:Poor    Executive Functions  Concentration:Fair   Attention Span:Fair   Recall:Fair   Fund of Knowledge:Fair   Language:Fair    Psychomotor Activity  Psychomotor Activity:Psychomotor Activity: Normal    Assets  Assets:Communication Skills    Sleep  Sleep:Sleep: Poor     Physical Exam: Physical exam: Please see exam on admit note. General: Well developed,  well nourished.  Pupils: Normal at 3mm Respiratory: Breathing is unlabored.  Cardiovascular: No edema.  Language: No anomia, no aphasia Muscle strength and tone-pt moving all extremities.  Gait not assessed as pt remained in bed.  Neuro: Facial muscles are symmetric. Pt without tremor, no evidence of hyperarousal.  Review of Systems  Constitutional: Negative.   HENT: Negative.    Eyes: Negative.   Respiratory: Negative.    Cardiovascular: Negative.   Gastrointestinal: Negative.   Genitourinary: Negative.   Musculoskeletal: Negative.   Skin: Negative.   Neurological: Negative.   Endo/Heme/Allergies: Negative.   Psychiatric/Behavioral:  Positive for hallucinations. The patient is nervous/anxious and has insomnia.    There were no vitals taken for this visit. There is no height or weight on file to calculate BMI.   ASSESSMENT: Principal Problem:   Schizophrenia (HCC)  Cannabis use disorder Stimulant use  43 yo M with multiple admission history for psychosis and poor compliance with medications as well as continued use of stimulants. Working diagnosis is schizophrenia ( differential diagnosis includes schizoaffective disorder bipolar type however patient is poor historian and refuses for me to contact any of his family) Patient evaluated in morning, was discharged and presents again in evening, floridly psychotic with paranoid persecutory delusions. States they have been primarily sexual although denies of command nature at time of evaluation. Patient believes multiple family members are inserting these voices in his head. Responded poorly to Risperdal , restarting Prolixin  and have also discussed starting Clozapine. Patient will need CBC with diff in the AM. Patient overall is poor historian and history of noncompliance with medications. Patient requiring 1:1 due to agitation, intrusive AH and wandering into other  patient's rooms. Danger to others at this time.  Patient presenting with  acute on chronic exacerbation of psychosis. Patient UDS positive for amphetamine  and THC, Rule out substance induced psychosis.  [facility] day 0.   Treatment Plan Summary: Daily contact with patient to assess and evaluate symptoms and progress in treatment, Medication management, and Plan    Observation Level/Precautions:  15 minute checks  Laboratory:  CBC Chemistry Profile Folic Acid HbAIC HCG UDS UA Vitamin B-12  Psychotherapy:  denies  Medications:  Prolixin , Cogentin , Depakote , considering CLozapine  Consultations:    Discharge Concerns:  stabilization of psychosis and LAI  Estimated LOS: 5-7 days  Other:     Safety and Monitoring: INVOLUTARILY (completed first exam, second exam to be completed by different provider tomorrow AM) admission to inpatient psychiatric unit for safety, stabilization and treatment Daily contact with patient to assess and evaluate symptoms and progress in treatment Patient's case to be discussed in multi-disciplinary team meeting Observation Level : q15 minute checks Vital signs: q12 hours Precautions: suicide, elopement, and assault  2. Psychiatric Problems #schizophrenia ( differential diagnosis includes schizoaffective disorder bipolar type however patient is poor historian and refuses for me to contact any of his family) -restart  Depakote  750 mg BID -restart cogentin  1 mg BID -increase Prolixin  to 5 mg PO TID -restart hydroxyzine  50 mg q6h-PRN for anxiety -restart trazodone  200 mg qhs insomnia -restart Ativan  1 mg q12 hr for high anxiety and agitation   -STOP Adderal 20 mg BID   3. Medical Management Covid negative CMP: wnl,calcium 8.5 CBC: unremarkable EtOH: <10 UDS: + amphetamine , +THC TSH: wnl A1C:  wn Lipids: ordered   #HTN -restart amlodipine  5 mg qdaily  Physician Treatment Plan for Primary Diagnosis: Schizophrenia (HCC) Long Term Goal(s): Improvement in symptoms so as ready for discharge  Short Term Goals: Ability to  identify changes in lifestyle to reduce recurrence of condition will improve, Ability to verbalize feelings will improve, Ability to disclose and discuss suicidal ideas, Ability to demonstrate self-control will improve, Ability to identify and develop effective coping behaviors will improve, Ability to maintain clinical measurements within normal limits will improve, Compliance with prescribed medications will improve, and Ability to identify triggers associated with substance abuse/mental health issues will improve  Physician Treatment Plan for Secondary Diagnosis: Principal Problem:   Schizophrenia (HCC)    I certify that inpatient services furnished can reasonably be expected to improve the patient's condition.    Lynwood Kubisiak, MD 5/20/20256:05 PM

## 2024-01-24 ENCOUNTER — Encounter (HOSPITAL_COMMUNITY): Payer: Self-pay

## 2024-01-24 LAB — CBC WITH DIFFERENTIAL/PLATELET
Abs Immature Granulocytes: 0.03 10*3/uL (ref 0.00–0.07)
Basophils Absolute: 0.1 10*3/uL (ref 0.0–0.1)
Basophils Relative: 2 %
Eosinophils Absolute: 0.2 10*3/uL (ref 0.0–0.5)
Eosinophils Relative: 4 %
HCT: 42.9 % (ref 39.0–52.0)
Hemoglobin: 13.7 g/dL (ref 13.0–17.0)
Immature Granulocytes: 1 %
Lymphocytes Relative: 26 %
Lymphs Abs: 1.3 10*3/uL (ref 0.7–4.0)
MCH: 29.7 pg (ref 26.0–34.0)
MCHC: 31.9 g/dL (ref 30.0–36.0)
MCV: 92.9 fL (ref 80.0–100.0)
Monocytes Absolute: 0.4 10*3/uL (ref 0.1–1.0)
Monocytes Relative: 9 %
Neutro Abs: 3.1 10*3/uL (ref 1.7–7.7)
Neutrophils Relative %: 58 %
Platelets: 254 10*3/uL (ref 150–400)
RBC: 4.62 MIL/uL (ref 4.22–5.81)
RDW: 13.6 % (ref 11.5–15.5)
WBC: 5.2 10*3/uL (ref 4.0–10.5)
nRBC: 0 % (ref 0.0–0.2)

## 2024-01-24 MED ORDER — FLUPHENAZINE HCL 5 MG PO TABS
5.0000 mg | ORAL_TABLET | Freq: Two times a day (BID) | ORAL | Status: DC
  Administered 2024-01-24 – 2024-01-29 (×10): 5 mg via ORAL
  Filled 2024-01-24: qty 20
  Filled 2024-01-24 (×12): qty 1

## 2024-01-24 MED ORDER — CLOZAPINE 25 MG PO TABS
25.0000 mg | ORAL_TABLET | Freq: Every day | ORAL | Status: DC
  Administered 2024-01-24 – 2024-01-26 (×3): 25 mg via ORAL
  Filled 2024-01-24 (×4): qty 1

## 2024-01-24 NOTE — Progress Notes (Signed)
 [facility] Post 1:1 Observation Documentation  For the first (8) hours following discontinuation of 1:1 precautions, a progress note entry by nursing staff should be documented at least every 2 hours, reflecting the patient's behavior, condition, mood, and conversation.  Use the progress notes for additional entries.  Time 1:1 discontinued:  1356  Patient's Behavior:  Anxious, PRN Vistaril  25 mg PO given at 1645 with desired effect when reassessed at 1745.   Patient's Condition:    Patient's Conversation:  "I feel much better".  William Romero 01/24/2024, 7:53 PM

## 2024-01-24 NOTE — Progress Notes (Signed)
 [facility] Post 1:1 Observation Documentation  For the first (8) hours following discontinuation of 1:1 precautions, a progress note entry by nursing staff should be documented at least every 2 hours, reflecting the patient's behavior, condition, mood, and conversation.  Use the progress notes for additional entries.  Time 1:1 discontinued:  1356  Patient's Behavior:  calm , cooperative  Patient's Condition:  sad  Patient's Conversation:  pt stated he was ok  William Romero 01/24/2024, 8:00 PM

## 2024-01-24 NOTE — Progress Notes (Signed)
Nursing 1:1 note D:Pt observed sleeping in bed with eyes closed. RR even and unlabored. No distress noted. A: 1:1 observation continues for safety  R: pt remains safe  

## 2024-01-24 NOTE — Progress Notes (Signed)
   01/24/24 1945  Psych Admission Type (Psych Patients Only)  Admission Status Involuntary  Psychosocial Assessment  Patient Complaints None  Eye Contact Fair  Facial Expression Flat  Affect Appropriate to circumstance  Speech Slow  Interaction Assertive  Motor Activity Slow  Appearance/Hygiene Disheveled  Behavior Characteristics Cooperative  Mood Anxious;Preoccupied  Aggressive Behavior  Effect No apparent injury  Thought Process  Coherency WDL  Content Preoccupation  Delusions None reported or observed  Perception Hallucinations  Hallucination Auditory  Judgment Impaired  Confusion WDL  Danger to Self  Current suicidal ideation? Denies  Danger to Others  Danger to Others None reported or observed

## 2024-01-24 NOTE — Progress Notes (Signed)
 [facility] Post 1:1 Observation Documentation  For the first (8) hours following discontinuation of 1:1 precautions, a progress note entry by nursing staff should be documented at least every 2 hours, reflecting the patient's behavior, condition, mood, and conversation.  Use the progress notes for additional entries.  Time 1:1 discontinued:  1356  Patient's Behavior:  visible in dayroom at intervals with peers watched TV.   Patient's Condition:  Calm, cooperative with care at this time.   Patient's Conversation: Pt denies concerns "I'm good".    William Romero 01/24/2024, 7:48 PM

## 2024-01-24 NOTE — BH IP Treatment Plan (Signed)
 Interdisciplinary Treatment and Diagnostic Plan Update  01/24/2024 Time of Session: 12:00 PM - UPDATE William Romero MRN: [id number]  Principal Diagnosis: Schizophrenia (HCC)  Secondary Diagnoses: Principal Problem:   Schizophrenia (HCC)   Current Medications:  Current Facility-Administered Medications  Medication Dose Route Frequency Provider Last Rate Last Admin   acetaminophen  (TYLENOL ) tablet 650 mg  650 mg Oral Q6H PRN Zouev, Dmitri, MD       alum & mag hydroxide-simeth (MAALOX/MYLANTA) 200-200-20 MG/5ML suspension 30 mL  30 mL Oral Q4H PRN Zouev, Dmitri, MD       benztropine  (COGENTIN ) tablet 1 mg  1 mg Oral Q12H Zouev, Dmitri, MD   1 mg at 01/24/24 0804   haloperidol  (HALDOL ) tablet 5 mg  5 mg Oral TID PRN Zouev, Dmitri, MD       And   diphenhydrAMINE  (BENADRYL ) capsule 50 mg  50 mg Oral TID PRN Zouev, Dmitri, MD       haloperidol  lactate (HALDOL ) injection 5 mg  5 mg Intramuscular TID PRN Zouev, Dmitri, MD       And   diphenhydrAMINE  (BENADRYL ) injection 50 mg  50 mg Intramuscular TID PRN Zouev, Dmitri, MD       And   LORazepam  (ATIVAN ) injection 2 mg  2 mg Intramuscular TID PRN Zouev, Dmitri, MD       haloperidol  lactate (HALDOL ) injection 10 mg  10 mg Intramuscular TID PRN Zouev, Dmitri, MD       And   diphenhydrAMINE  (BENADRYL ) injection 50 mg  50 mg Intramuscular TID PRN Zouev, Dmitri, MD       And   LORazepam  (ATIVAN ) injection 2 mg  2 mg Intramuscular TID PRN Zouev, Dmitri, MD       divalproex  (DEPAKOTE ) DR tablet 750 mg  750 mg Oral Q12H Zouev, Dmitri, MD   750 mg at 01/24/24 0804   fluPHENAZine  (PROLIXIN ) tablet 5 mg  5 mg Oral Q8H Zouev, Dmitri, MD   5 mg at 01/24/24 0804   hydrOXYzine  (ATARAX ) tablet 50 mg  50 mg Oral TID PRN Zouev, Dmitri, MD       LORazepam  (ATIVAN ) tablet 1 mg  1 mg Oral Q12H Zouev, Dmitri, MD   1 mg at 01/24/24 0804   magnesium  hydroxide (MILK OF MAGNESIA) suspension 30 mL  30 mL Oral Daily PRN Zouev, Dmitri, MD       mirtazapine  (REMERON   SOL-TAB) disintegrating tablet 15 mg  15 mg Oral QHS Zouev, Dmitri, MD   15 mg at 01/23/24 2022   nicotine  (NICODERM CQ  - dosed in mg/24 hours) patch 21 mg  21 mg Transdermal Q0600 Zouev, Dmitri, MD   21 mg at 01/24/24 0804   traZODone  (DESYREL ) tablet 200 mg  200 mg Oral QHS Zouev, Dmitri, MD   200 mg at 01/23/24 2021   PTA Medications: Medications Prior to Admission  Medication Sig Dispense Refill Last Dose/Taking   benztropine  (COGENTIN ) 1 MG tablet Take 1 tablet (1 mg total) by mouth every 12 (twelve) hours.      divalproex  (DEPAKOTE ) 250 MG DR tablet Take 3 tablets (750 mg total) by mouth every 12 (twelve) hours.      fluPHENAZine  (PROLIXIN ) 5 MG tablet Take 1 tablet (5 mg total) by mouth every 8 (eight) hours.      hydrOXYzine  (ATARAX ) 25 MG tablet Take 1 tablet (25 mg total) by mouth 3 (three) times daily as needed for anxiety.      LORazepam  (ATIVAN ) 1 MG tablet Take 1 tablet (1  mg total) by mouth every 12 (twelve) hours.      mirtazapine  (REMERON  SOL-TAB) 15 MG disintegrating tablet Take 1 tablet (15 mg total) by mouth at bedtime.      nicotine  (NICODERM CQ  - DOSED IN MG/24 HOURS) 14 mg/24hr patch Place 1 patch (14 mg total) onto the skin daily.      nicotine  polacrilex (NICORETTE ) 2 MG gum Take 1 each (2 mg total) by mouth as needed for smoking cessation.      traZODone  (DESYREL ) 100 MG tablet Take 2 tablets (200 mg total) by mouth at bedtime.       Patient Stressors:    Patient Strengths:    Treatment Modalities: Medication Management, Group therapy, Case management,  1 to 1 session with clinician, Psychoeducation, Recreational therapy.   Physician Treatment Plan for Primary Diagnosis: Schizophrenia (HCC) Long Term Goal(s): Improvement in symptoms so as ready for discharge   Short Term Goals: Ability to identify changes in lifestyle to reduce recurrence of condition will improve Ability to verbalize feelings will improve Ability to disclose and discuss suicidal  ideas Ability to demonstrate self-control will improve Ability to identify and develop effective coping behaviors will improve Ability to maintain clinical measurements within normal limits will improve Compliance with prescribed medications will improve Ability to identify triggers associated with substance abuse/mental health issues will improve  Medication Management: Evaluate patient's response, side effects, and tolerance of medication regimen.  Therapeutic Interventions: 1 to 1 sessions, Unit Group sessions and Medication administration.  Evaluation of Outcomes: Progressing  Physician Treatment Plan for Secondary Diagnosis: Principal Problem:   Schizophrenia (HCC)  Long Term Goal(s): Improvement in symptoms so as ready for discharge   Short Term Goals: Ability to identify changes in lifestyle to reduce recurrence of condition will improve Ability to verbalize feelings will improve Ability to disclose and discuss suicidal ideas Ability to demonstrate self-control will improve Ability to identify and develop effective coping behaviors will improve Ability to maintain clinical measurements within normal limits will improve Compliance with prescribed medications will improve Ability to identify triggers associated with substance abuse/mental health issues will improve     Medication Management: Evaluate patient's response, side effects, and tolerance of medication regimen.  Therapeutic Interventions: 1 to 1 sessions, Unit Group sessions and Medication administration.  Evaluation of Outcomes: Progressing   RN Treatment Plan for Primary Diagnosis: Schizophrenia (HCC) Long Term Goal(s): Knowledge of disease and therapeutic regimen to maintain health will improve  Short Term Goals: Ability to remain free from injury will improve, Ability to verbalize frustration and anger appropriately will improve, Ability to verbalize feelings will improve, and Ability to disclose and discuss  suicidal ideas  Medication Management: RN will administer medications as ordered by provider, will assess and evaluate patient's response and provide education to patient for prescribed medication. RN will report any adverse and/or side effects to prescribing provider.  Therapeutic Interventions: 1 on 1 counseling sessions, Psychoeducation, Medication administration, Evaluate responses to treatment, Monitor vital signs and CBGs as ordered, Perform/monitor CIWA, COWS, AIMS and Fall Risk screenings as ordered, Perform wound care treatments as ordered.  Evaluation of Outcomes: Progressing   [name] Treatment Plan for Primary Diagnosis: Schizophrenia (HCC) Long Term Goal(s): Safe transition to appropriate next level of care at discharge, Engage patient in therapeutic group addressing interpersonal concerns.  Short Term Goals: Engage patient in aftercare planning with referrals and resources, Increase ability to appropriately verbalize feelings, Facilitate acceptance of mental health diagnosis and concerns, and Identify triggers associated with mental health/substance  abuse issues  Therapeutic Interventions: Assess for all discharge needs, 1 to 1 time with Child psychotherapist, Explore available resources and support systems, Assess for adequacy in community support network, Educate family and significant other(s) on suicide prevention, Complete Psychosocial Assessment, Interpersonal group therapy.  Evaluation of Outcomes: Progressing   Progress in Treatment: Attending groups: No. Participating in groups: No. Taking medication as prescribed: Yes. Toleration medication: Yes. Family/Significant other contact made:  Pt declined consents Patient understands diagnosis: Yes. Discussing patient identified problems/goals with staff: Yes. Medical problems stabilized or resolved: Yes. Denies suicidal/homicidal ideation: Yes. Issues/concerns per patient self-inventory: No.   New problem(s) identified: No,  Describe:  None   New Short Term/Long Term Goal(s): medication stabilization, elimination of SI thoughts, development of comprehensive mental wellness plan.    Patient Goals:  "The voices are really bothering me"   Discharge Plan or Barriers: Patient recently admitted. CSW will continue to follow and assess for appropriate referrals and possible discharge planning.    Reason for Continuation of Hospitalization: Aggression Anxiety Delusions  Medication stabilization Other; describe Mood stabilization, discharge planning   Estimated Length of Stay:  3 - 4 days  Last 3 [place] Suicide Severity Risk Score: Flowsheet Row Ad[facility] (Discharged) [facility] from [facility]11[facility]24 [facility] Clinical Support from [facility]/14/2023 in [facility]  C-SSRS RISK CATEGORY No Risk No Risk No Risk       Last [facility] 2/9 Scores:    11/14/2023    9:48 AM 11/07/2023    9:50 AM 04/20/2023    4:08 PM  Depression screen PHQ 2/9  Decreased Interest 0 1 1  Down, Depressed, Hopeless 0 0 0  PHQ - 2 Score 0 1 1  Altered sleeping   1  Tired, decreased energy   0  Change in appetite   0  Feeling bad or failure about yourself    0  Trouble concentrating   3  Moving slowly or fidgety/restless   3  Suicidal thoughts   0  PHQ-9 Score   8  Difficult doing work/chores   Not difficult at all    Scribe for Treatment Team: Teruo Stilley O Casmere Hollenbeck, LCSWA 01/24/2024 12:55 PM

## 2024-01-24 NOTE — Group Note (Signed)
 Date:  01/24/2024 Time:  1:14 PM  Group Topic/Focus:  Goals Group:   The focus of this group is to help patients establish daily goals to achieve during treatment and discuss how the patient can incorporate goal setting into their daily lives to aide in recovery. Orientation:   The focus of this group is to educate the patient on the purpose and policies of crisis stabilization and provide a format to answer questions about their admission.  The group details unit policies and expectations of patients while admitted.    Participation Level:  Did Not Attend  William Romero 01/24/2024, 1:14 PM

## 2024-01-24 NOTE — Progress Notes (Signed)
 [facility] Post 1:1 Observation Documentation  For the first (8) hours following discontinuation of 1:1 precautions, a progress note entry by nursing staff should be documented at least every 2 hours, reflecting the patient's behavior, condition, mood, and conversation.  Use the progress notes for additional entries.  Time 1:1 discontinued:  1356  Patient's Behavior:  calm, redirectable, not going into peer's rooms.   Patient's Condition: Cooperative     Patient's Conversation:  Denies concerns.   Ronique Simerly 01/24/2024, 7:51 PM

## 2024-01-24 NOTE — Group Note (Signed)
 Recreation Therapy Group Note   Group Topic:Communication  Group Date: 01/24/2024 Start Time: 1017 End Time: 1035 Facilitators: Jewelianna Pancoast-McCall, LRT,CTRS Location: 500 [address] Dayroom   Group Topic: Communication, Team Building, Problem Solving  Goal Area(s) Addresses:  Patient will effectively work with peer towards shared goal.  Patient will identify skills used to make activity successful.  Patient will identify how skills used during activity can be applied to reach post d/c goals.   Behavioral Response: Appropriate  Intervention: STEM Activity- Glass blower/designer  Activity: Tallest [employer]. In teams of 5-6, patients were given 11 craft pipe cleaners. Using the materials provided, patients were instructed to compete again the opposing team(s) to build the tallest free-standing structure from floor level. The activity was timed; difficulty increased by Clinical research associate as Production designer, theatre/television/film continued.  Systematically resources were removed with additional directions for example, placing one arm behind their back, working in silence, and shape stipulations. LRT facilitated post-activity discussion reviewing team processes and necessary communication skills involved in completion. Patients were encouraged to reflect how the skills utilized, or not utilized, in this activity can be incorporated to positively impact support systems post discharge.  Education: Pharmacist, community, Scientist, physiological, Discharge Planning   Education Outcome: Acknowledges education/In group clarification offered/Needs additional education.    Affect/Mood: Appropriate   Participation Level: Engaged   Participation Quality: Independent   Behavior: Appropriate   Speech/Thought Process: Focused   Insight: Good   Judgement: Good   Modes of Intervention: STEM Activity   Patient Response to Interventions:  Engaged   Education Outcome:  In group clarification offered    Clinical  Observations/Individualized Feedback: Pt was bright and lead his group in constructing the tower. Pt came up with the idea and used the help of peers to construct it. Pt expressed the group used teamwork and perseverance in completing the activity. Pt was also attentive to peers as well.     Plan: Continue to engage patient in RT group sessions 2-3x/week.   Akiyah Eppolito-McCall, LRT,CTRS 01/24/2024 1:28 PM

## 2024-01-24 NOTE — Plan of Care (Signed)
   Problem: Activity: Goal: Interest or engagement in activities will improve Outcome: Progressing   Problem: Coping: Goal: Ability to verbalize frustrations and anger appropriately will improve Outcome: Progressing Goal: Ability to demonstrate self-control will improve Outcome: Progressing

## 2024-01-24 NOTE — Progress Notes (Signed)
 Pt A & O to self, place and time with prompts. Denies SI, HI and AVH when assessed "not right now. The voices are better too. I think the medicines helped me a lot". Observed with fair eye contact, logical speech, slow but steady gait. Rates his anxiety, anger and depression all 0/10 "I'm doing ok so far". Visible in scheduled groups, participated well. Tolerates fluids and breakfast well. 1:1 observation maintained without incident at this time. Assigned staff in attendance.

## 2024-01-24 NOTE — Progress Notes (Signed)
 [facility] MD Progress Note  01/24/2024 5:53 PM William Romero  MRN:  [id number]  Per chart review, vitals are stable, patient is compliant with medications on the unit.  As needed Atarax  for anxiety being used average once to twice daily.  As needed Haldol  and Benadryl  were given for irritability and restlessness 5/18 at 6 PM.  Per staff patient continues to have occasions of reporting worsening voices, wandering around the unit requiring redirection, on one-to-one precaution which will continue.  Subjective:   Patient was assessed in his room today with 1:1 sitter present. Patient reports continue AH of commentary of "whispering 3 male voices." Denies that they are command in nature but states they have said "sexual" and "derogatory" things in the past. Patient however denies SI or HI. When asked if they were telling him to go into patients rooms, patient denies. Patient however does not feel Prolixin  has reduced the volume of the voices he is experiencing. Notes Ativan  is helping with anxiety. Patient again asks to complete Clozapine trial.  Patient poor historian but states he has tried Risperdal  and Haldol  previously and they were ineffective. Discussed with nursing staff who note patient is more redirectable and will attempt trial of lifting 1:1. Patient did reports some early morning dizziness on standing and had low BP after increase of Prolixin , thus discussed lower dose. Denies SI or HI.  Denies EPS or TD reports fair sleep last night and fair appetite.  Principal Problem: Schizophrenia (HCC) Diagnosis: Principal Problem:   Schizophrenia (HCC)  Total Time spent with patient: 35 minutes  Past Psychiatric History:  Current psychiatrist: Previously seen by Dr. Augusta Blizzard 04/13/2023, poor followup Current therapist: none. Previous reported psychiatric diagnoses:  ?ADHD, schizoaffective disorder bipolar type, GAD Current psychiatric medications: none. Psychiatric medication history/compliance:  Per chart review, poor with the exception of Adderall.  Full med trials include: adderall, bupropion, duloxetine , haloperidol , hydroxyzine  (ineffective), lorazepam , olanzapine , ziprasidone , paliperidone , risperidone ,  Psychiatric hospitalization(s): Approximately 6 stays over the past from 2012-2019.  Most recently, patient had 3 separate stays at [facility] in 2024, 6/11 - 6/19 and 6/22 - 7/1, and on 10/2023 Psychotherapy history: None Neuromodulation history: none. History of suicide (obtained from HPI): Reported 1 attempted OD in distant past, presently denied History of homicide or aggression (obtained in HPI): none.  Past Medical History:  Past Medical History:  Diagnosis Date   Paranoid schizophrenia (HCC) 10/03/2023   Schizophrenia [facility] [place])     Past Surgical History:  Procedure Laterality Date   APPENDECTOMY     Family History: No family history on file. Family Psychiatric  History:  Denies family psychiatric history  Social History:  Social History   Substance and Sexual Activity  Alcohol Use Not Currently   Alcohol/week: 5.0 standard drinks of alcohol   Types: 5 Cans of beer per week     Social History   Substance and Sexual Activity  Drug Use No    Social History   Socioeconomic History   Marital status: Single    Spouse name: Not on file   Number of children: Not on file   Years of education: Not on file   Highest education level: Not on file  Occupational History   Not on file  Tobacco Use   Smoking status: Former    Current packs/day: 0.50    Types: Cigarettes   Smokeless tobacco: Never  Vaping Use   Vaping status: Never Used  Substance and Sexual Activity   Alcohol use: Not Currently  Alcohol/week: 5.0 standard drinks of alcohol    Types: 5 Cans of beer per week   Drug use: No   Sexual activity: Not Currently  Other Topics Concern   Not on file  Social History Narrative   Not on file   Social Drivers of Health   Financial Resource Strain: Not on  file  Food Insecurity: No Food Insecurity (01/23/2024)   Hunger Vital Sign    Worried About Running Out of Food in the Last Year: Never true    Ran Out of Food in the Last Year: Never true  Transportation Needs: No Transportation Needs (01/23/2024)   PRAPARE - Administrator, Civil Service (Medical): No    Lack of Transportation (Non-Medical): No  Physical Activity: Not on file  Stress: Not on file  Social Connections: Not on file   Additional Social History:                         Sleep: Fair  Appetite:  Fair  Current Medications: Current Facility-Administered Medications  Medication Dose Route Frequency Provider Last Rate Last Admin   acetaminophen  (TYLENOL ) tablet 650 mg  650 mg Oral Q6H PRN Kenneth Lax, MD   650 mg at 01/24/24 1556   alum & mag hydroxide-simeth (MAALOX/MYLANTA) 200-200-20 MG/5ML suspension 30 mL  30 mL Oral Q4H PRN Alysiah Suppa, MD       benztropine  (COGENTIN ) tablet 1 mg  1 mg Oral Q12H Paradise Vensel, MD   1 mg at 01/24/24 0804   cloZAPine (CLOZARIL) tablet 25 mg  25 mg Oral QHS Zacari Stiff, MD       haloperidol  (HALDOL ) tablet 5 mg  5 mg Oral TID PRN Lannis Lichtenwalner, MD       And   diphenhydrAMINE  (BENADRYL ) capsule 50 mg  50 mg Oral TID PRN Jerrik Housholder, MD       haloperidol  lactate (HALDOL ) injection 5 mg  5 mg Intramuscular TID PRN Orrin Yurkovich, MD       And   diphenhydrAMINE  (BENADRYL ) injection 50 mg  50 mg Intramuscular TID PRN Murrel Bertram, MD       And   LORazepam  (ATIVAN ) injection 2 mg  2 mg Intramuscular TID PRN Esthela Brandner, MD       haloperidol  lactate (HALDOL ) injection 10 mg  10 mg Intramuscular TID PRN Selene Peltzer, MD       And   diphenhydrAMINE  (BENADRYL ) injection 50 mg  50 mg Intramuscular TID PRN Brinden Kincheloe, MD       And   LORazepam  (ATIVAN ) injection 2 mg  2 mg Intramuscular TID PRN Garek Schuneman, MD       divalproex  (DEPAKOTE ) DR tablet 750 mg  750 mg Oral Q12H Lydell Moga, MD   750 mg at 01/24/24  0804   fluPHENAZine  (PROLIXIN ) tablet 5 mg  5 mg Oral Q12H Quinterrius Errington, MD       hydrOXYzine  (ATARAX ) tablet 50 mg  50 mg Oral TID PRN Venezia Sargeant, MD   50 mg at 01/24/24 1645   LORazepam  (ATIVAN ) tablet 1 mg  1 mg Oral Q12H Yanelly Cantrelle, MD   1 mg at 01/24/24 0804   magnesium  hydroxide (MILK OF MAGNESIA) suspension 30 mL  30 mL Oral Daily PRN Miyako Oelke, MD       mirtazapine  (REMERON  SOL-TAB) disintegrating tablet 15 mg  15 mg Oral QHS Altair Stanko, MD   15 mg at 01/23/24 2022   nicotine  (  NICODERM CQ  - dosed in mg/24 hours) patch 21 mg  21 mg Transdermal Q0600 Ebone Alcivar, MD   21 mg at 01/24/24 0804   traZODone  (DESYREL ) tablet 200 mg  200 mg Oral QHS Andie Mortimer, MD   200 mg at 01/23/24 2021    Lab Results:  Results for orders placed or performed during the hospital encounter of 01/13/24 (from the past 48 hours)  Valproic acid  level     Status: None   Collection Time: 01/23/24  6:20 AM  Result Value Ref Range   Valproic Acid  Lvl 83 50 - 100 ug/mL    Comment: Performed at [facility], [address] [place], [place] [address]  Hepatic function panel     Status: None   Collection Time: 01/23/24  6:20 AM  Result Value Ref Range   Total Protein 6.9 6.5 - 8.1 g/dL   Albumin 3.6 3.5 - 5.0 g/dL   AST 21 15 - 41 U/L   ALT 19 0 - 44 U/L   Alkaline Phosphatase 64 38 - 126 U/L   Total Bilirubin 0.5 0.0 - 1.2 mg/dL   Bilirubin, Direct <6.0 0.0 - 0.2 mg/dL    Comment: REPEATED TO VERIFY   Indirect Bilirubin NOT CALCULATED 0.3 - 0.9 mg/dL    Comment: Performed at [facility], [address] [place], [place] [address]      Blood Alcohol level:  Lab Results  Component Value Date   [facility] <15 01/11/2024   ETH <10 10/03/2023    Metabolic Disorder Labs: Lab Results  Component Value Date   HGBA1C 5.2 01/15/2024   MPG 103 01/15/2024   MPG 99.67 10/03/2023   No results found for: "PROLACTIN" Lab Results  Component Value Date   CHOL  168 01/15/2024   TRIG 172 (H) 01/15/2024   HDL 42 01/15/2024   CHOLHDL 4.0 01/15/2024   VLDL 34 01/15/2024   LDLCALC 92 01/15/2024   LDLCALC 98 10/03/2023    Physical Findings: AIMS:  , ,  ,  ,    CIWA:    COWS:     Musculoskeletal: Strength & Muscle Tone: within normal limits Gait & Station: normal Patient leans: N/A  Psychiatric Specialty Exam:  Presentation  General Appearance:  Bizarre; Disheveled  Eye Contact: Improved  Speech: Improved, more linear  Speech Volume: Normal, not increased  Handedness: Right   Mood and Affect  Mood: Dysphoric, less anxious and less irritable  Affect: Congruent   Thought Process  Thought Processes: Less disorganized  Descriptions of Associations: Less tangential more concrete  Orientation:Full (Time, Place and Person)  Thought Content: Less disorganized and delusional, improved auditory hallucinations  History of Schizophrenia/Schizoaffective disorder:Yes  Duration of Psychotic Symptoms:Greater than six months  Hallucinations:Hallucinations: Auditory Description of Auditory Hallucinations: "sexual things"  Decreased auditory hallucinations Ideas of Reference:Decreased to paranoia Suicidal Thoughts:Suicidal Thoughts: No  Denies passive or active SI Homicidal Thoughts:Homicidal Thoughts: No  Denies HI  Sensorium  Memory: Immediate Fair  Judgment: Limited  Insight: Limited   Executive Functions  Concentration: Improved yet limited  Attention Span: Improved yet limited  Recall: [employer] of Knowledge: Fair  Language: Fair   Psychomotor Activity  Psychomotor Activity: Psychomotor Activity: Normal    Assets  Assets: Communication Skills   Sleep  Sleep: Sleep: Poor     Physical Exam: Physical exam: Please see exam on admit note. General: Well developed, well nourished.  Pupils: Normal at 3mm Respiratory: Breathing is unlabored.  Cardiovascular: No  edema.  Language:  No anomia, no aphasia Muscle strength and tone-pt moving all extremities.  Gait not assessed as pt remained in bed.  Neuro: Facial muscles are symmetric. Pt without tremor, no evidence of hyperarousal.  Review of Systems  Constitutional: Negative.   HENT: Negative.    Eyes: Negative.   Respiratory: Negative.    Cardiovascular: Negative.   Gastrointestinal: Negative.   Genitourinary: Negative.   Musculoskeletal: Negative.   Skin: Negative.   Neurological: Negative.   Endo/Heme/Allergies: Negative.   Psychiatric/Behavioral:  Positive for hallucinations and substance abuse. The patient is nervous/anxious.    Blood pressure 107/65, pulse 85, temperature 98.5 F (36.9 C), temperature source Oral, SpO2 98%. There is no height or weight on file to calculate BMI.   Treatment Plan Summary: Daily contact with patient to assess and evaluate symptoms and progress in treatment, Medication management, and Plan as noted below  5/16 patient seems to have some improvement in his psychosis with current treatment regimen since titrated risperidone  from 2 to 3 mg twice daily, will follow.  5/20: Patient poor historian but states he has tried Risperdal  and Haldol  previously and they were ineffective. Tells me "I saw a Dr. Lugon in the 90's for psychiatric" but otherwise unable to elaborate on dosing or duration. Patient remains highly anxious, irritable, restless and had been entering other patient's room requiring 1:1 due to command AH telling him to enter other peoples rooms. NO EPS or akathisia on evaluation.   5/21: patient requesting Clozaril trial stating Prolixin , Haldol , and Risperdal  have not decreased the volume of AH. Patient does appear calmer and less intrusive and agitated today however also reports some morning dizziness with low BP this morning. Will reduce Prolixin  dosing to 5 mg BID and complete Clozapine trial per patient request after discussing r/b/a including but not limited to:  constipation, ileus, myocarditis, agrunulocytosis, required blood monitoring, increased seizure risk, lethargy, weight gain, sedation, siallorhea, tachycardia.  Treatment Plan Summary: Daily contact with patient to assess and evaluate symptoms and progress in treatment, Medication management, and Plan     Observation Level/Precautions:  15 minute checks  Laboratory:  CBC Chemistry Profile Folic Acid HbAIC HCG UDS UA Vitamin B-12  Psychotherapy:  denies  Medications:  olanzapine , Cogentin   Consultations:    Discharge Concerns:  stabilization of psychosis and LAI  Estimated LOS: 5-7 days  Other:      Safety and Monitoring: INVOLUTARILY (completed second exam) admission to inpatient psychiatric unit for safety, stabilization and treatment Daily contact with patient to assess and evaluate symptoms and progress in treatment Patient's case to be discussed in multi-disciplinary team meeting Observation Level : q15 minute checks Vital signs: q12 hours Precautions: suicide, elopement, and assault   2. Psychiatric Problems #schizoaffective disorder bipolar type - 5/16 Depakote  was titrated to 750 mg twice daily and obtain another Depakote  level on 5/20 Depakote  level low therapeutic 47 on 5/15  -cont cogentin  1 mg BID for EPS prophylaxis -decrease Prolixin  to 5 mg BID due to low BP and dizziness this morning (patient primarily describes orthostatic) patient does appear calmer, less intrusive and agitated however states he does not feel it has lowered the AH - will start Clozapine 25 mg at bedtime -start Bowel regimen of Docusate 100 mg daily and Miralax 35 mg daily - discussed with inpatient pharmacist, ordered baseline CBC with diff prior to starting Clozapine will repeat in 4-7 days.  On my review of documentation patient with dubious response to Prolixin  during previous hospitalization, will  consider using Prolixin  LAI due to history of poor compliance and followup depending on  respond to Clozapine; patient requesting to try Clozapine as states he has not noticed any benefit from trials of Risperdal , Haldol , Invega  on prior hospitalizations in decreasing his AH which are intrusive and bothersome and have led to agitation throughout hospitalization    - cont Ativan  1 mg q12h  for high anxiety, irritability and mood lability with last dose on  5/18 - Continue trazodone  200 mg at bedtime scheduled for insomnia      3. Medical Management Covid negative CMP: wnl,calcium 8.5 CBC: unremarkable EtOH: <10 UDS: + amphetamine , +THC TSH: ordered A1C:  ordered, pending Lipids: triglycerides elevated otherwise wnl Depakote  level 5/15 low therapeutic 47 Depakote  level due 5/20   #HTN -cont amlodipine  5 mg qdaily   Physician Treatment Plan for Primary Diagnosis: Schizophrenia, unspecified (HCC) Long Term Goal(s): Improvement in symptoms so as ready for discharge   Short Term Goals: Ability to identify changes in lifestyle to reduce recurrence of condition will improve, Ability to verbalize feelings will improve, Ability to disclose and discuss suicidal ideas, Ability to demonstrate self-control will improve, Ability to identify and develop effective coping behaviors will improve, Ability to maintain clinical measurements within normal limits will improve, Compliance with prescribed medications will improve, and Ability to identify triggers associated with substance abuse/mental health issues will improve   Physician Treatment Plan for Secondary Diagnosis: Principal Problem:   Schizophrenia, unspecified (HCC)       I certify that inpatient services furnished can reasonably be expected to improve the patient's condition.      Yeny Schmoll, MD 01/24/2024, 5:53 PM

## 2024-01-24 NOTE — Plan of Care (Signed)
  Problem: Education: Goal: Mental status will improve Outcome: Progressing   Problem: Activity: Goal: Interest or engagement in activities will improve Outcome: Progressing Goal: Sleeping patterns will improve Outcome: Progressing   Problem: Coping: Goal: Ability to demonstrate self-control will improve Outcome: Progressing

## 2024-01-24 NOTE — Group Note (Signed)
 Date:  01/24/2024 Time:  8:36 PM  Group Topic/Focus:  Wrap-Up Group:   The focus of this group is to help patients review their daily goal of treatment and discuss progress on daily workbooks.    Participation Level:  Did Not Attend   Edell Mesenbrink Dacosta 01/24/2024, 8:36 PM

## 2024-01-25 ENCOUNTER — Inpatient Hospital Stay (HOSPITAL_COMMUNITY): Payer: Self-pay

## 2024-01-25 ENCOUNTER — Encounter (HOSPITAL_COMMUNITY): Payer: Self-pay | Admitting: Psychiatry

## 2024-01-25 ENCOUNTER — Other Ambulatory Visit: Payer: Self-pay

## 2024-01-25 DIAGNOSIS — F200 Paranoid schizophrenia: Secondary | ICD-10-CM | POA: Diagnosis not present

## 2024-01-25 LAB — TROPONIN I (HIGH SENSITIVITY)
Troponin I (High Sensitivity): 5 ng/L (ref <18)
Troponin I (High Sensitivity): 5 ng/L (ref <18)

## 2024-01-25 MED ORDER — ACETAMINOPHEN 500 MG PO TABS
1000.0000 mg | ORAL_TABLET | Freq: Once | ORAL | Status: AC
  Administered 2024-01-25: 1000 mg via ORAL
  Filled 2024-01-25: qty 2

## 2024-01-25 NOTE — ED Provider Notes (Signed)
 [facility] AT [facility] Provider Note   CSN: [id number] Arrival date & time: 01/25/24  1031     History  Chief Complaint  Patient presents with   Chest Pain    William Romero is a 43 y.o. male.  Pt presents from [facility] with c/o mid to left chest pain in past day. Indicates constant, mild, dull, non radiating, at rest. No exertional chest pain. No pleuritic chest pain. No associated sob, nv or diaphoresis. Denies personal or fam hx heart disease. No palpitations. No cough or uri symptoms. No heartburn. No chest wall injury or strain. No skin lesions or swelling to area. Denies leg pain or swelling. No hx dvt or pe. No neck, back or flank pain.   The history is provided by the patient and medical records.  Chest Pain Associated symptoms: no abdominal pain, no back pain, no cough, no fever, no headache, no nausea, no palpitations, no shortness of breath and no vomiting        Home Medications Prior to Admission medications   Medication Sig Start Date End Date Taking? Authorizing Provider  benztropine  (COGENTIN ) 1 MG tablet Take 1 tablet (1 mg total) by mouth every 12 (twelve) hours. 01/23/24   Zouev, Dmitri, MD  divalproex  (DEPAKOTE ) 250 MG DR tablet Take 3 tablets (750 mg total) by mouth every 12 (twelve) hours. 01/23/24   Zouev, Dmitri, MD  fluPHENAZine  (PROLIXIN ) 5 MG tablet Take 1 tablet (5 mg total) by mouth every 8 (eight) hours. 01/23/24   Zouev, Dmitri, MD  hydrOXYzine  (ATARAX ) 25 MG tablet Take 1 tablet (25 mg total) by mouth 3 (three) times daily as needed for anxiety. 01/23/24   Zouev, Dmitri, MD  LORazepam  (ATIVAN ) 1 MG tablet Take 1 tablet (1 mg total) by mouth every 12 (twelve) hours. 01/23/24   Zouev, Dmitri, MD  mirtazapine  (REMERON  SOL-TAB) 15 MG disintegrating tablet Take 1 tablet (15 mg total) by mouth at bedtime. 01/23/24   Zouev, Dmitri, MD  nicotine  (NICODERM CQ  - DOSED IN MG/24 HOURS) 14 mg/24hr patch Place 1 patch (14 mg total) onto  the skin daily. 01/24/24   Zouev, Dmitri, MD  nicotine  polacrilex (NICORETTE ) 2 MG gum Take 1 each (2 mg total) by mouth as needed for smoking cessation. 01/23/24   Zouev, Dmitri, MD  traZODone  (DESYREL ) 100 MG tablet Take 2 tablets (200 mg total) by mouth at bedtime. 01/23/24   Zouev, Dmitri, MD      Allergies    Patient has no known allergies.    Review of Systems   Review of Systems  Constitutional:  Negative for fever.  HENT:  Negative for sore throat.   Respiratory:  Negative for cough and shortness of breath.   Cardiovascular:  Positive for chest pain. Negative for palpitations and leg swelling.  Gastrointestinal:  Negative for abdominal pain, nausea and vomiting.  Genitourinary:  Negative for flank pain.  Musculoskeletal:  Negative for back pain and neck pain.  Skin:  Negative for rash.  Neurological:  Negative for headaches.  Psychiatric/Behavioral:  Negative for confusion.     Physical Exam Updated Vital Signs BP 107/60   Pulse 80   Temp 98.5 F (36.9 C) (Oral)   Resp 17   Wt 93.9 kg   SpO2 98%   BMI 26.58 kg/m  Physical Exam Vitals and nursing note reviewed.  Constitutional:      Appearance: Normal appearance. He is well-developed.  HENT:     Head: Atraumatic.  Nose: Nose normal.     Mouth/Throat:     Mouth: Mucous membranes are moist.  Eyes:     General: No scleral icterus.    Conjunctiva/sclera: Conjunctivae normal.  Neck:     Trachea: No tracheal deviation.  Cardiovascular:     Rate and Rhythm: Normal rate and regular rhythm.     Pulses: Normal pulses.     Heart sounds: Normal heart sounds. No murmur heard.    No friction rub. No gallop.  Pulmonary:     Effort: Pulmonary effort is normal. No accessory muscle usage or respiratory distress.     Breath sounds: Normal breath sounds.  Chest:     Chest wall: No tenderness.  Abdominal:     General: Bowel sounds are normal. There is no distension.     Palpations: Abdomen is soft.     Tenderness: There  is no abdominal tenderness.  Musculoskeletal:        General: No swelling or tenderness.     Cervical back: Normal range of motion and neck supple. No rigidity.     Right lower leg: No edema.     Left lower leg: No edema.  Skin:    General: Skin is warm and dry.     Findings: No rash.  Neurological:     Mental Status: He is alert.     Comments: Alert, speech clear. Motor/sens grossly intact.   Psychiatric:        Mood and Affect: Mood normal.     ED Results / Procedures / Treatments   Labs (all labs ordered are listed, but only abnormal results are displayed) Results for orders placed or performed during the hospital encounter of 01/23/24  CBC with Differential/Platelet   Collection Time: 01/24/24  6:27 PM  Result Value Ref Range   WBC 5.2 4.0 - 10.5 K/uL   RBC 4.62 4.22 - 5.81 MIL/uL   Hemoglobin 13.7 13.0 - 17.0 g/dL   HCT 95.6 21.3 - 08.6 %   MCV 92.9 80.0 - 100.0 fL   MCH 29.7 26.0 - 34.0 pg   MCHC 31.9 30.0 - 36.0 g/dL   RDW 57.8 46.9 - 62.9 %   Platelets 254 150 - 400 K/uL   nRBC 0.0 0.0 - 0.2 %   Neutrophils Relative % 58 %   Neutro Abs 3.1 1.7 - 7.7 K/uL   Lymphocytes Relative 26 %   Lymphs Abs 1.3 0.7 - 4.0 K/uL   Monocytes Relative 9 %   Monocytes Absolute 0.4 0.1 - 1.0 K/uL   Eosinophils Relative 4 %   Eosinophils Absolute 0.2 0.0 - 0.5 K/uL   Basophils Relative 2 %   Basophils Absolute 0.1 0.0 - 0.1 K/uL   Immature Granulocytes 1 %   Abs Immature Granulocytes 0.03 0.00 - 0.07 K/uL  Troponin I (High Sensitivity)   Collection Time: 01/25/24 11:10 AM  Result Value Ref Range   Troponin I (High Sensitivity) 5 <18 ng/L   ED ECG REPORT   Date: 01/25/2024  Rate: 80  Rhythm: normal sinus rhythm  QRS Axis: normal  Intervals: normal  ST/T Wave abnormalities: nonspecific ST/T changes  Conduction Disutrbances:none  Narrative Interpretation:   Old EKG Reviewed: unchanged  I have personally reviewed the EKG tracing  Radiology DG Chest 2 View Result Date:  01/25/2024 CLINICAL DATA:  Chest pain. EXAM: CHEST - 2 VIEW COMPARISON:  September 03, 2022. FINDINGS: The heart size and mediastinal contours are within normal limits. Both lungs are clear. The  visualized skeletal structures are unremarkable. IMPRESSION: No active cardiopulmonary disease. Electronically Signed   By: Rosalene Colon M.D.   On: 01/25/2024 13:05    Procedures Procedures    Medications Ordered in ED Medications  acetaminophen  (TYLENOL ) tablet 650 mg (650 mg Oral Given 01/24/24 1556)  alum & mag hydroxide-simeth (MAALOX/MYLANTA) 200-200-20 MG/5ML suspension 30 mL (has no administration in time range)  magnesium  hydroxide (MILK OF MAGNESIA) suspension 30 mL (has no administration in time range)  haloperidol  (HALDOL ) tablet 5 mg (has no administration in time range)    And  diphenhydrAMINE  (BENADRYL ) capsule 50 mg (has no administration in time range)  haloperidol  lactate (HALDOL ) injection 5 mg (has no administration in time range)    And  diphenhydrAMINE  (BENADRYL ) injection 50 mg (has no administration in time range)    And  LORazepam  (ATIVAN ) injection 2 mg (has no administration in time range)  haloperidol  lactate (HALDOL ) injection 10 mg (has no administration in time range)    And  diphenhydrAMINE  (BENADRYL ) injection 50 mg (has no administration in time range)    And  LORazepam  (ATIVAN ) injection 2 mg (has no administration in time range)  hydrOXYzine  (ATARAX ) tablet 50 mg (50 mg Oral Given 01/24/24 1645)  traZODone  (DESYREL ) tablet 200 mg (200 mg Oral Given 01/24/24 2038)  nicotine  (NICODERM CQ  - dosed in mg/24 hours) patch 21 mg (21 mg Transdermal Patient Refused/Not Given 01/25/24 0825)  mirtazapine  (REMERON  SOL-TAB) disintegrating tablet 15 mg (15 mg Oral Given 01/24/24 2039)  LORazepam  (ATIVAN ) tablet 1 mg (1 mg Oral Given 01/25/24 0824)  divalproex  (DEPAKOTE ) DR tablet 750 mg (750 mg Oral Given 01/25/24 0824)  benztropine  (COGENTIN ) tablet 1 mg (1 mg Oral Given 01/25/24  0824)  fluPHENAZine  (PROLIXIN ) tablet 5 mg (5 mg Oral Given 01/25/24 0824)  cloZAPine (CLOZARIL) tablet 25 mg (25 mg Oral Given 01/24/24 2047)  acetaminophen  (TYLENOL ) tablet 1,000 mg (1,000 mg Oral Given 01/25/24 1101)    ED Course/ Medical Decision Making/ A&P                                 Medical Decision Making Problems Addressed: Atypical chest pain: acute illness or injury with systemic symptoms that poses a threat to life or bodily functions Precordial chest pain: acute illness or injury with systemic symptoms that poses a threat to life or bodily functions  Amount and/or Complexity of Data Reviewed External Data Reviewed: notes. Labs: ordered. Decision-making details documented in ED Course. Radiology: ordered and independent interpretation performed. Decision-making details documented in ED Course. ECG/medicine tests: ordered and independent interpretation performed. Decision-making details documented in ED Course.  Risk OTC drugs. Decision regarding hospitalization.   Iv ns. Continuous pulse ox and cardiac monitoring. Labs ordered/sent. Imaging ordered.   Differential diagnosis includes acs, msk cp, gi cp, etc. Dispo decision including potential need for admission considered - will get labs and imaging and reassess.   Reviewed nursing notes and prior charts for additional history. External reports reviewed.   Cardiac monitor: sinus rhythm, rate 80.  Labs reviewed/interpreted by me - wbc and hgb normal. Trop normal - after symptoms for past day, felt not c/w acs.   Acetaminophen  po.   Xrays reviewed/interpreted by me - no pna.   Recheck pt, no chest pain or sob. Discussed trop and plan for outpatient f/u.   Pt appears stable for return to [facility].  Rec outpatient pcp/card f/u.  Return precautions provided.  Final Clinical Impression(s) / ED Diagnoses Final diagnoses:  Precordial chest pain  Atypical chest pain    Rx / DC Orders ED Discharge  Orders          Ordered    Ambulatory referral to Cardiology       Comments: If you have not heard from the Cardiology office within the next 72 hours please call [phone].   01/25/24 1500              Guadalupe Lee, MD 01/25/24 [phone]

## 2024-01-25 NOTE — Plan of Care (Signed)
   Problem: Education: Goal: Emotional status will improve Outcome: Progressing Goal: Mental status will improve Outcome: Progressing   Problem: Activity: Goal: Interest or engagement in activities will improve Outcome: Progressing Goal: Sleeping patterns will improve Outcome: Progressing

## 2024-01-25 NOTE — Progress Notes (Addendum)
 Pt c/o chest pain. Dr. Zouev notified. EKG completed and transport called for pt to be transported to hospital, per new MD order

## 2024-01-25 NOTE — ED Notes (Addendum)
 Arrives from [facility]. Concurrently inpt under IVC for psychosis paranoid schizophrenia and AH transferred out to ED for c/o CP. 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 Denies SI at this time. Alert, NAD, calm, interactive, resps e/u, speaking in clear complete sentences. VSS. Denies pain, sob or nausea.

## 2024-01-25 NOTE — ED Notes (Signed)
 [employer] here for transport 3 copies attached to clipboard in blue zone given to officer

## 2024-01-25 NOTE — Progress Notes (Signed)
 Notified by nursing staff patient reporting chest pain onset 15 minutes ago. Patient seen and describes "sharp, throbbing" substernal chest pain that he also describes as "weight" on his chest. Denies radiation. Denies anything making it better or worse. Patient asked about any prior chest pain episodes and states "never like this." Patient overall has been very poor historian throughout admission the past week with respect to both his medical and psychiatric history. Does not appear to be following up with PCP. Patient thinks his father "may have had something cardiac."   Spoke with Arlin Benes ED Charge nurse and accepting provider. Dr. Dolan Freiberg accepting physician and signout given  -maintain 1:1 as patient is under IVC - stat EKG ordered -stat troponin ordered - if patient needs medical admission, please place psychiatry consult

## 2024-01-25 NOTE — Progress Notes (Signed)
   01/25/24 0824  Psych Admission Type (Psych Patients Only)  Admission Status Involuntary  Psychosocial Assessment  Patient Complaints Anxiety  Eye Contact Fair  Facial Expression Flat  Affect Appropriate to circumstance  Speech Slow  Interaction Assertive  Motor Activity Slow  Appearance/Hygiene Disheveled  Behavior Characteristics Cooperative  Mood Preoccupied  Thought Process  Coherency WDL  Content Preoccupation  Delusions None reported or observed  Perception Hallucinations  Hallucination Auditory  Judgment Impaired  Confusion None  Danger to Self  Current suicidal ideation? Denies  Agreement Not to Harm Self Yes  Description of Agreement verbal  Danger to Others  Danger to Others None reported or observed

## 2024-01-25 NOTE — Progress Notes (Signed)
   01/25/24 2000  Psych Admission Type (Psych Patients Only)  Admission Status Involuntary  Psychosocial Assessment  Patient Complaints Anxiety  Eye Contact Fair  Facial Expression Flat  Affect Appropriate to circumstance  Speech Slow  Interaction Assertive  Motor Activity Slow  Appearance/Hygiene Disheveled  Behavior Characteristics Cooperative;Anxious  Mood Angry;Preoccupied;Anxious  Aggressive Behavior  Effect No apparent injury  Thought Process  Coherency WDL  Content Preoccupation  Delusions None reported or observed  Perception Hallucinations  Hallucination Auditory  Judgment Impaired  Confusion WDL  Danger to Self  Current suicidal ideation? Denies  Danger to Others  Danger to Others None reported or observed

## 2024-01-25 NOTE — Group Note (Signed)
 Date:  01/25/2024 Time:  11:04 PM  Group Topic/Focus:  Wrap-Up Group:   The focus of this group is to help patients review their daily goal of treatment and discuss progress on daily workbooks.    Additional Comments:  Pt was encouraged, but opted out of attending wrap up group this evening.   LORALI KHAMIS 01/25/2024, 11:04 PM

## 2024-01-25 NOTE — ED Notes (Signed)
 Patient came in already IVC documents have been printed out, 3 copies attached to clipboard in blue, 1 in medical records drawer and one in red folder

## 2024-01-25 NOTE — ED Notes (Signed)
 EDP at [facility]

## 2024-01-25 NOTE — Discharge Instructions (Signed)
 Return to [facility].  Follow up with primary care doctor/cardiologist in the next 1-2 weeks.  Return to ER if worse, new symptoms, fevers, persistent/recurrent chest pain, increased trouble breathing, or other concern.

## 2024-01-25 NOTE — ED Notes (Signed)
 IVC remains at [facility]. [facility] notified primary RN and AC. IVC paperwork requested, pending fax. Pt with sitter to xray. Remains amiable, alert, NAD, calm, interactive, polite.

## 2024-01-25 NOTE — ED Notes (Signed)
 Called [facility] transport pt to [facility]- NO ETA PROVIDED

## 2024-01-26 DIAGNOSIS — F200 Paranoid schizophrenia: Secondary | ICD-10-CM | POA: Diagnosis not present

## 2024-01-26 MED ORDER — CLOZAPINE 25 MG PO TABS
25.0000 mg | ORAL_TABLET | Freq: Once | ORAL | Status: AC
Start: 2024-01-26 — End: 2024-01-26
  Administered 2024-01-26: 25 mg via ORAL
  Filled 2024-01-26: qty 1

## 2024-01-26 NOTE — Group Note (Signed)
 Date:  01/26/2024 Time:  8:51 PM  Group Topic/Focus:  Wrap-Up Group:   The focus of this group is to help patients review their daily goal of treatment and discuss progress on daily workbooks.    Participation Level:  Did Not Attend   Alyssa Rotondo Dacosta 01/26/2024, 8:51 PM

## 2024-01-26 NOTE — [facility] Group Notes (Signed)
 Adult Psychoeducational Group Note  Date:  01/26/2024 Time:  7:36 PM  Group Topic/Focus:  Goals Group:   The focus of this group is to help patients establish daily goals to achieve during treatment and discuss how the patient can incorporate goal setting into their daily lives to aide in recovery. Orientation:   The focus of this group is to educate the patient on the purpose and policies of crisis stabilization and provide a format to answer questions about their admission.  The group details unit policies and expectations of patients while admitted.  Participation Level:  Did Not Attend  Participation Quality:    Affect:    Cognitive:    Insight:   Engagement in Group:    Modes of Intervention:    Additional Comments:    Joan Avetisyan O 01/26/2024, 7:36 PM

## 2024-01-26 NOTE — Group Note (Signed)
 Recreation Therapy Group Note   Group Topic:Other  Group Date: 01/26/2024 Start Time: 1025 End Time: 1110 Facilitators: Keshawna Dix-McCall, LRT,CTRS Location: [address] Day[address]   Group Topic/Focus: Personal Development   Goal Area(s) Addresses:  Patient will identify things that help them develop as a person.   Patient will express how personal development can be used post d/c.  Behavioral Response:    Intervention: Movie  Activity: LRT played "The Last Champion". 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 He returned home when his mother passed and had to face all judgement and hatred towards him head on by constantly keeping it in the for front and not letting him get past it.    Affect/Mood: N/A   Participation Level: Did not attend    Clinical Observations/Individualized Feedback:    Plan: Continue to engage patient in RT group sessions 2-3x/week.   Bunnie Rehberg-McCall, LRT,CTRS  01/26/2024 12:53 PM

## 2024-01-26 NOTE — Progress Notes (Signed)
 [facility] MD Progress Note  01/26/2024 7:32 PM William Romero  MRN:  [id number]    Subjective:   Patient was assessed in his room and reports anxiety. Continues to report "derogatory voices of females and my family." States they are whispers that have never improved with any medication. He is interested in trying a higher dose of Clozapine. Reports anxiety.  Patient however denies SI or HI.Denies voices being command in nature. Notes Ativan  is helping with anxiety. Tolerating Clozapine starting dose without any side effects and reports good bowel movements. Patient denies any dizziness today after lowering Prolixin  dosing to 5 mg BID. Patient poor historian but states he has tried Risperdal  and Haldol  previously and they were ineffective. Denies SI or HI.  Denies EPS or TD reports fair sleep last night and fair appetite.  Principal Problem: Schizophrenia (HCC) Diagnosis: Principal Problem:   Schizophrenia (HCC)  Total Time spent with patient: 35 minutes  Past Psychiatric History:  Current psychiatrist: Previously seen by Dr. Augusta Blizzard 04/13/2023, poor followup Current therapist: none. Previous reported psychiatric diagnoses:  ?ADHD, schizoaffective disorder bipolar type, GAD Current psychiatric medications: none. Psychiatric medication history/compliance: Per chart review, poor with the exception of Adderall.  Full med trials include: adderall, bupropion, duloxetine , haloperidol , hydroxyzine  (ineffective), lorazepam , olanzapine , ziprasidone , paliperidone , risperidone ,  Psychiatric hospitalization(s): Approximately 6 stays over the past from 2012-2019.  Most recently, patient had 3 separate stays at [facility] in 2024, 6/11 - 6/19 and 6/22 - 7/1, and on 10/2023 Psychotherapy history: None Neuromodulation history: none. History of suicide (obtained from HPI): Reported 1 attempted OD in distant past, presently denied History of homicide or aggression (obtained in HPI): none.  Past Medical History:  Past  Medical History:  Diagnosis Date   Paranoid schizophrenia (HCC) 10/03/2023   Schizophrenia (HCC)     Past Surgical History:  Procedure Laterality Date   APPENDECTOMY     Family History: History reviewed. No pertinent family history. Family Psychiatric  History:  Denies family psychiatric history  Social History:  Social History   Substance and Sexual Activity  Alcohol Use Not Currently   Alcohol/week: 5.0 standard drinks of alcohol   Types: 5 Cans of beer per week     Social History   Substance and Sexual Activity  Drug Use No    Social History   Socioeconomic History   Marital status: Single    Spouse name: Not on file   Number of children: Not on file   Years of education: Not on file   Highest education level: Not on file  Occupational History   Not on file  Tobacco Use   Smoking status: Former    Current packs/day: 0.50    Types: Cigarettes   Smokeless tobacco: Never  Vaping Use   Vaping status: Never Used  Substance and Sexual Activity   Alcohol use: Not Currently    Alcohol/week: 5.0 standard drinks of alcohol    Types: 5 Cans of beer per week   Drug use: No   Sexual activity: Not Currently  Other Topics Concern   Not on file  Social History Narrative   Not on file   Social Drivers of Health   Financial Resource Strain: Not on file  Food Insecurity: No Food Insecurity (01/25/2024)   Hunger Vital Sign    Worried About Running Out of Food in the Last Year: Never true    Ran Out of Food in the Last Year: Never true  Transportation Needs: No Transportation Needs (01/25/2024)  PRAPARE - Administrator, Civil Service (Medical): No    Lack of Transportation (Non-Medical): No  Physical Activity: Not on file  Stress: Not on file  Social Connections: Not on file   Additional Social History:                         Sleep: Fair  Appetite:  Fair  Current Medications: Current Facility-Administered Medications  Medication Dose  Route Frequency Provider Last Rate Last Admin   acetaminophen  (TYLENOL ) tablet 650 mg  650 mg Oral Q6H PRN Daryan Cagley, MD   650 mg at 01/24/24 1556   alum & mag hydroxide-simeth (MAALOX/MYLANTA) 200-200-20 MG/5ML suspension 30 mL  30 mL Oral Q4H PRN Glennie Rodda, MD       benztropine  (COGENTIN ) tablet 1 mg  1 mg Oral Q12H Saja Bartolini, MD   1 mg at 01/26/24 0754   cloZAPine (CLOZARIL) tablet 25 mg  25 mg Oral QHS Andrue Dini, MD   25 mg at 01/25/24 2054   haloperidol  (HALDOL ) tablet 5 mg  5 mg Oral TID PRN Duan Scharnhorst, MD       And   diphenhydrAMINE  (BENADRYL ) capsule 50 mg  50 mg Oral TID PRN Saori Umholtz, MD       haloperidol  lactate (HALDOL ) injection 5 mg  5 mg Intramuscular TID PRN Eleuterio Dollar, MD       And   diphenhydrAMINE  (BENADRYL ) injection 50 mg  50 mg Intramuscular TID PRN Kesleigh Morson, MD       And   LORazepam  (ATIVAN ) injection 2 mg  2 mg Intramuscular TID PRN Aysel Gilchrest, MD       haloperidol  lactate (HALDOL ) injection 10 mg  10 mg Intramuscular TID PRN Casara Perrier, MD       And   diphenhydrAMINE  (BENADRYL ) injection 50 mg  50 mg Intramuscular TID PRN Timo Hartwig, MD       And   LORazepam  (ATIVAN ) injection 2 mg  2 mg Intramuscular TID PRN Danford Tat, MD       divalproex  (DEPAKOTE ) DR tablet 750 mg  750 mg Oral Q12H Avinash Maltos, MD   750 mg at 01/26/24 0754   fluPHENAZine  (PROLIXIN ) tablet 5 mg  5 mg Oral Q12H Giovanne Nickolson, MD   5 mg at 01/26/24 0754   hydrOXYzine  (ATARAX ) tablet 50 mg  50 mg Oral TID PRN Hendricks Schwandt, MD   50 mg at 01/26/24 1213   LORazepam  (ATIVAN ) tablet 1 mg  1 mg Oral Q12H Rajah Lamba, MD   1 mg at 01/26/24 0755   magnesium  hydroxide (MILK OF MAGNESIA) suspension 30 mL  30 mL Oral Daily PRN Justinn Welter, MD       mirtazapine  (REMERON  SOL-TAB) disintegrating tablet 15 mg  15 mg Oral QHS Taner Rzepka, MD   15 mg at 01/25/24 2053   nicotine  (NICODERM CQ  - dosed in mg/24 hours) patch 21 mg  21 mg Transdermal Q0600 Lillard Bailon,  MD   21 mg at 01/24/24 0804   traZODone  (DESYREL ) tablet 200 mg  200 mg Oral QHS Claxton Levitz, MD   200 mg at 01/25/24 2053    Lab Results:  Results for orders placed or performed during the hospital encounter of 01/23/24 (from the past 48 hours)  Troponin I (High Sensitivity)     Status: None   Collection Time: 01/25/24 11:10 AM  Result Value Ref Range   Troponin I (High Sensitivity) 5 <18 ng/L  Comment: (NOTE) Elevated high sensitivity troponin I (hsTnI) values and significant  changes across serial measurements may suggest ACS but many other  chronic and acute conditions are known to elevate hsTnI results.  Refer to the "Links" section for chest pain algorithms and additional  guidance. Performed at [facility], [address], [place], [place] [address]   Troponin I (High Sensitivity)     Status: None   Collection Time: 01/25/24  6:32 PM  Result Value Ref Range   Troponin I (High Sensitivity) 5 <18 ng/L    Comment: (NOTE) Elevated high sensitivity troponin I (hsTnI) values and significant  changes across serial measurements may suggest ACS but many other  chronic and acute conditions are known to elevate hsTnI results.  Refer to the "Links" section for chest pain algorithms and additional  guidance. Performed at [facility], [address] [place], [place] [address]       Blood Alcohol level:  Lab Results  Component Value Date   [facility] <15 01/11/2024   ETH <10 10/03/2023    Metabolic Disorder Labs: Lab Results  Component Value Date   HGBA1C 5.2 01/15/2024   MPG 103 01/15/2024   MPG 99.67 10/03/2023   No results found for: "PROLACTIN" Lab Results  Component Value Date   CHOL 168 01/15/2024   TRIG 172 (H) 01/15/2024   HDL 42 01/15/2024   CHOLHDL 4.0 01/15/2024   VLDL 34 01/15/2024   LDLCALC 92 01/15/2024   LDLCALC 98 10/03/2023    Physical Findings: AIMS:  , ,  ,  ,    CIWA:    COWS:     Musculoskeletal: Strength &  Muscle Tone: within normal limits Gait & Station: normal Patient leans: N/A  Psychiatric Specialty Exam:  Presentation  General Appearance:  Bizarre; Disheveled  Eye Contact: Improved  Speech: Improved, more linear  Speech Volume: Normal, not increased  Handedness: Right   Mood and Affect  Mood: Dysphoric, less anxious and less irritable  Affect: Congruent   Thought Process  Thought Processes: Less disorganized  Descriptions of Associations: Less tangential more concrete  Orientation:Full (Time, Place and Person)  Thought Content: Less disorganized and delusional, improved auditory hallucinations  History of Schizophrenia/Schizoaffective disorder:Yes  Duration of Psychotic Symptoms:Greater than six months  Hallucinations:No data recorded  Decreased auditory hallucinations Ideas of Reference:Decreased to paranoia Suicidal Thoughts:No data recorded  Denies passive or active SI Homicidal Thoughts:No data recorded  Denies HI  Sensorium  Memory: Immediate Fair  Judgment: Limited  Insight: Limited   Executive Functions  Concentration: Improved yet limited  Attention Span: Improved yet limited  Recall: [employer] of Knowledge: Fair  Language: Fair   Psychomotor Activity  Psychomotor Activity: No data recorded    Assets  Assets: Communication Skills   Sleep  Sleep: No data recorded     Physical Exam: Physical exam: Please see exam on admit note. General: Well developed, well nourished.  Pupils: Normal at 3mm Respiratory: Breathing is unlabored.  Cardiovascular: No edema.  Language: No anomia, no aphasia Muscle strength and tone-pt moving all extremities.  Gait not assessed as pt remained in bed.  Neuro: Facial muscles are symmetric. Pt without tremor, no evidence of hyperarousal.  Review of Systems  Constitutional: Negative.   HENT: Negative.    Eyes: Negative.   Respiratory: Negative.    Cardiovascular:  Negative.   Gastrointestinal: Negative.   Genitourinary: Negative.   Musculoskeletal: Negative.   Skin: Negative.   Neurological: Negative.  Endo/Heme/Allergies: Negative.   Psychiatric/Behavioral:  Positive for hallucinations and substance abuse. The patient is nervous/anxious.    Blood pressure 107/81, pulse 92, temperature 98.5 F (36.9 C), temperature source Oral, resp. rate 16, weight 93.9 kg, SpO2 98%. Body mass index is 26.58 kg/m.   Treatment Plan Summary: Daily contact with patient to assess and evaluate symptoms and progress in treatment, Medication management, and Plan as noted below  5/16 patient seems to have some improvement in his psychosis with current treatment regimen since titrated risperidone  from 2 to 3 mg twice daily, will follow.  5/20: Patient poor historian but states he has tried Risperdal  and Haldol  previously and they were ineffective. Tells me "I saw a Dr. Lugon in the 90's for psychiatric" but otherwise unable to elaborate on dosing or duration. Patient remains highly anxious, irritable, restless and had been entering other patient's room requiring 1:1 due to command AH telling him to enter other peoples rooms. NO EPS or akathisia on evaluation.   5/21: patient requesting Clozaril trial stating Prolixin , Haldol , and Risperdal  have not decreased the volume of AH. Patient does appear calmer and less intrusive and agitated today however also reports some morning dizziness with low BP this morning. Will reduce Prolixin  dosing to 5 mg BID and complete Clozapine trial per patient request after discussing r/b/a including but not limited to: constipation, ileus, myocarditis, agrunulocytosis, required blood monitoring, increased seizure risk, lethargy, weight gain, sedation, siallorhea, tachycardia.  5/23: patient no longer dizzy or having chest pain. Prolixin  dose lowered and BP is back to normal. Tolerating Clozapine and requesting increased dose. Reports intrusive AH  of multiple voices although denies SI, HI. Denies they are command in nature.  Treatment Plan Summary: Daily contact with patient to assess and evaluate symptoms and progress in treatment, Medication management, and Plan     Observation Level/Precautions:  15 minute checks  Laboratory:  CBC Chemistry Profile Folic Acid HbAIC HCG UDS UA Vitamin B-12  Psychotherapy:  denies  Medications:  olanzapine , Cogentin   Consultations:    Discharge Concerns:  stabilization of psychosis and LAI  Estimated LOS: 5-7 days  Other:      Safety and Monitoring: INVOLUTARILY (completed second exam) admission to inpatient psychiatric unit for safety, stabilization and treatment Daily contact with patient to assess and evaluate symptoms and progress in treatment Patient's case to be discussed in multi-disciplinary team meeting Observation Level : q15 minute checks Vital signs: q12 hours Precautions: suicide, elopement, and assault   2. Psychiatric Problems #schizoaffective disorder bipolar type - 5/16 Depakote  was titrated to 750 mg twice daily and obtain another Depakote  level on 5/20 Depakote  level low therapeutic 47 on 5/15  -cont cogentin  1 mg BID for EPS prophylaxis -cont Prolixin  to 5 mg BID due to low BP and dizziness this morning (patient primarily describes orthostatic) patient does appear calmer, less intrusive and agitated however states he does not feel it has lowered the AH - increase Clozapine 25 mg BID -cont Bowel regimen of Docusate 100 mg daily and Miralax 35 mg daily - discussed with inpatient pharmacist, ordered baseline CBC with diff prior to starting Clozapine will repeat in 4-7 days.  On my review of documentation patient with dubious response to Prolixin  during previous hospitalization, will consider using Prolixin  LAI due to history of poor compliance and followup depending on respond to Clozapine; patient requesting to try Clozapine as states he has not noticed any benefit  from trials of Risperdal , Haldol , Invega  on prior hospitalizations in decreasing his AH  which are intrusive and bothersome and have led to agitation throughout hospitalization    - cont Ativan  1 mg q12h  for high anxiety, irritability and mood lability with last dose on  5/18 - Continue trazodone  200 mg at bedtime scheduled for insomnia      3. Medical Management Covid negative CMP: wnl,calcium 8.5 CBC: unremarkable EtOH: <10 UDS: + amphetamine , +THC TSH: ordered A1C:  ordered, pending Lipids: triglycerides elevated otherwise wnl Depakote  level 5/15 low therapeutic 47 Depakote  level due 5/20   #HTN -cont amlodipine  5 mg qdaily   Physician Treatment Plan for Primary Diagnosis: Schizophrenia, unspecified (HCC) Long Term Goal(s): Improvement in symptoms so as ready for discharge   Short Term Goals: Ability to identify changes in lifestyle to reduce recurrence of condition will improve, Ability to verbalize feelings will improve, Ability to disclose and discuss suicidal ideas, Ability to demonstrate self-control will improve, Ability to identify and develop effective coping behaviors will improve, Ability to maintain clinical measurements within normal limits will improve, Compliance with prescribed medications will improve, and Ability to identify triggers associated with substance abuse/mental health issues will improve   Physician Treatment Plan for Secondary Diagnosis: Principal Problem:   Schizophrenia, unspecified (HCC)       I certify that inpatient services furnished can reasonably be expected to improve the patient's condition.      Carlinda Ohlson, MD 01/26/2024, 7:32 PM

## 2024-01-26 NOTE — Plan of Care (Addendum)
 Pt A & O to self, place and time with prompts. Denies SI, HI, AVH and pain when assessed "I feel better right now". However, he continues to make constat request for medications afterwards "I'm anxious right now, did the doctor put me some medications for me. Give me now, the Ativan ". Presents with fair affect, out in milieu majority of this shift, attended scheduled groups and participated in activities.  Verbal education done in reference to medication schedules. Safety checks maintained at Q 15 minutes intervals without outburst. Emotional support, encouragement and reassurance offered to pt.   Problem: Health Behavior/Discharge Planning: Goal: Compliance with treatment plan for underlying cause of condition will improve 01/26/2024 1704 by Maricus Tanzi, RN Outcome: Progressing    Problem: Safety: Goal: 01/26/2024 1704 by Doninique Lwin, RN Outcome: Progressing

## 2024-01-27 DIAGNOSIS — F200 Paranoid schizophrenia: Secondary | ICD-10-CM | POA: Diagnosis not present

## 2024-01-27 MED ORDER — CLOZAPINE 25 MG PO TABS
50.0000 mg | ORAL_TABLET | Freq: Every day | ORAL | Status: DC
  Administered 2024-01-27: 50 mg via ORAL
  Filled 2024-01-27: qty 2

## 2024-01-27 MED ORDER — CLOZAPINE 25 MG PO TABS
25.0000 mg | ORAL_TABLET | Freq: Every day | ORAL | Status: DC
  Administered 2024-01-27: 25 mg via ORAL
  Filled 2024-01-27: qty 1

## 2024-01-27 NOTE — Group Note (Signed)
 Date:  01/27/2024 Time:  8:37 PM  Group Topic/Focus:  Wrap-Up Group:   The focus of this group is to help patients review their daily goal of treatment and discuss progress on daily workbooks.    Participation Level:  Did Not Attend    William Romero 01/27/2024, 8:37 PM

## 2024-01-27 NOTE — Progress Notes (Signed)
 [facility] MD Progress Note  01/27/2024 1:54 PM William Romero  MRN:  [id number]    Subjective:   Patient was assessed in his room and reports anxiety. Continues to report "derogatory voices." Denies improvement and states they are mostly whispers. Patient is not agitated, denies SI, HI. Denies command AH. Agreeable to further Clozapine titration. Notes Ativan  is helping with anxiety. Reports previous dizziness is resolved and he denies any constipation. Reports some daytime sedation and asking for Clozapine to be switched at night fully.  Patient poor historian but states he has tried Risperdal  and Haldol  previously and they were ineffective. Denies EPS or TD reports fair sleep last night and fair appetite.  Principal Problem: Schizophrenia (HCC) Diagnosis: Principal Problem:   Schizophrenia (HCC)  Total Time spent with patient: 35 minutes  Past Psychiatric History:  Current psychiatrist: Previously seen by Dr. Augusta Blizzard 04/13/2023, poor followup Current therapist: none. Previous reported psychiatric diagnoses:  ?ADHD, schizoaffective disorder bipolar type, GAD Current psychiatric medications: none. Psychiatric medication history/compliance: Per chart review, poor with the exception of Adderall.  Full med trials include: adderall, bupropion, duloxetine , haloperidol , hydroxyzine  (ineffective), lorazepam , olanzapine , ziprasidone , paliperidone , risperidone ,  Psychiatric hospitalization(s): Approximately 6 stays over the past from 2012-2019.  Most recently, patient had 3 separate stays at [facility] in 2024, 6/11 - 6/19 and 6/22 - 7/1, and on 10/2023 Psychotherapy history: None Neuromodulation history: none. History of suicide (obtained from HPI): Reported 1 attempted OD in distant past, presently denied History of homicide or aggression (obtained in HPI): none.  Past Medical History:  Past Medical History:  Diagnosis Date   Paranoid schizophrenia (HCC) 10/03/2023   Schizophrenia (HCC)     Past  Surgical History:  Procedure Laterality Date   APPENDECTOMY     Family History: History reviewed. No pertinent family history. Family Psychiatric  History:  Denies family psychiatric history  Social History:  Social History   Substance and Sexual Activity  Alcohol Use Not Currently   Alcohol/week: 5.0 standard drinks of alcohol   Types: 5 Cans of beer per week     Social History   Substance and Sexual Activity  Drug Use No    Social History   Socioeconomic History   Marital status: Single    Spouse name: Not on file   Number of children: Not on file   Years of education: Not on file   Highest education level: Not on file  Occupational History   Not on file  Tobacco Use   Smoking status: Former    Current packs/day: 0.50    Types: Cigarettes   Smokeless tobacco: Never  Vaping Use   Vaping status: Never Used  Substance and Sexual Activity   Alcohol use: Not Currently    Alcohol/week: 5.0 standard drinks of alcohol    Types: 5 Cans of beer per week   Drug use: No   Sexual activity: Not Currently  Other Topics Concern   Not on file  Social History Narrative   Not on file   Social Drivers of Health   Financial Resource Strain: Not on file  Food Insecurity: No Food Insecurity (01/25/2024)   Hunger Vital Sign    Worried About Running Out of Food in the Last Year: Never true    Ran Out of Food in the Last Year: Never true  Transportation Needs: No Transportation Needs (01/25/2024)   PRAPARE - Administrator, Civil Service (Medical): No    Lack of Transportation (Non-Medical): No  Physical Activity:  Not on file  Stress: Not on file  Social Connections: Not on file   Additional Social History:                         Sleep: Fair  Appetite:  Fair  Current Medications: Current Facility-Administered Medications  Medication Dose Route Frequency Provider Last Rate Last Admin   acetaminophen  (TYLENOL ) tablet 650 mg  650 mg Oral Q6H PRN  Daelyn Pettaway, MD   650 mg at 01/24/24 1556   alum & mag hydroxide-simeth (MAALOX/MYLANTA) 200-200-20 MG/5ML suspension 30 mL  30 mL Oral Q4H PRN Eduardo Honor, MD       benztropine  (COGENTIN ) tablet 1 mg  1 mg Oral Q12H Wavie Hashimi, MD   1 mg at 01/27/24 0756   cloZAPine (CLOZARIL) tablet 50 mg  50 mg Oral QHS Ishmael Berkovich, MD       haloperidol  (HALDOL ) tablet 5 mg  5 mg Oral TID PRN Ameia Morency, MD       And   diphenhydrAMINE  (BENADRYL ) capsule 50 mg  50 mg Oral TID PRN Mirren Gest, MD       haloperidol  lactate (HALDOL ) injection 5 mg  5 mg Intramuscular TID PRN Lahari Suttles, MD       And   diphenhydrAMINE  (BENADRYL ) injection 50 mg  50 mg Intramuscular TID PRN Adryen Cookson, MD       And   LORazepam  (ATIVAN ) injection 2 mg  2 mg Intramuscular TID PRN Jhony Antrim, MD       haloperidol  lactate (HALDOL ) injection 10 mg  10 mg Intramuscular TID PRN Antoino Westhoff, MD       And   diphenhydrAMINE  (BENADRYL ) injection 50 mg  50 mg Intramuscular TID PRN Yaiden Yang, MD       And   LORazepam  (ATIVAN ) injection 2 mg  2 mg Intramuscular TID PRN Tommy Minichiello, MD       divalproex  (DEPAKOTE ) DR tablet 750 mg  750 mg Oral Q12H Gredmarie Delange, MD   750 mg at 01/27/24 0755   fluPHENAZine  (PROLIXIN ) tablet 5 mg  5 mg Oral Q12H Raffi Milstein, MD   5 mg at 01/27/24 0756   hydrOXYzine  (ATARAX ) tablet 50 mg  50 mg Oral TID PRN Jasara Corrigan, MD   50 mg at 01/26/24 1213   LORazepam  (ATIVAN ) tablet 1 mg  1 mg Oral Q12H Ricca Melgarejo, MD   1 mg at 01/27/24 0756   magnesium  hydroxide (MILK OF MAGNESIA) suspension 30 mL  30 mL Oral Daily PRN Nakoma Gotwalt, MD       mirtazapine  (REMERON  SOL-TAB) disintegrating tablet 15 mg  15 mg Oral QHS Monzerat Handler, MD   15 mg at 01/26/24 2041   nicotine  (NICODERM CQ  - dosed in mg/24 hours) patch 21 mg  21 mg Transdermal Q0600 Ardit Danh, MD   21 mg at 01/24/24 1610   traZODone  (DESYREL ) tablet 200 mg  200 mg Oral QHS Delitha Elms, MD   200 mg at 01/26/24 2041     Lab Results:  Results for orders placed or performed during the hospital encounter of 01/23/24 (from the past 48 hours)  Troponin I (High Sensitivity)     Status: None   Collection Time: 01/25/24  6:32 PM  Result Value Ref Range   Troponin I (High Sensitivity) 5 <18 ng/L    Comment: (NOTE) Elevated high sensitivity troponin I (hsTnI) values and significant  changes across serial measurements may suggest ACS but many  other  chronic and acute conditions are known to elevate hsTnI results.  Refer to the "Links" section for chest pain algorithms and additional  guidance. Performed at [facility], [address] [place], [place] [address]       Blood Alcohol level:  Lab Results  Component Value Date   [facility] <15 01/11/2024   ETH <10 10/03/2023    Metabolic Disorder Labs: Lab Results  Component Value Date   HGBA1C 5.2 01/15/2024   MPG 103 01/15/2024   MPG 99.67 10/03/2023   No results found for: "PROLACTIN" Lab Results  Component Value Date   CHOL 168 01/15/2024   TRIG 172 (H) 01/15/2024   HDL 42 01/15/2024   CHOLHDL 4.0 01/15/2024   VLDL 34 01/15/2024   LDLCALC 92 01/15/2024   LDLCALC 98 10/03/2023    Physical Findings: AIMS:  , ,  ,  ,    CIWA:    COWS:     Musculoskeletal: Strength & Muscle Tone: within normal limits Gait & Station: normal Patient leans: N/A  Psychiatric Specialty Exam:  Presentation  General Appearance:  Bizarre; Disheveled  Eye Contact: Improved  Speech: Improved, more linear  Speech Volume: Normal, not increased  Handedness: Right   Mood and Affect  Mood: Dysphoric, less anxious and less irritable  Affect: Congruent   Thought Process  Thought Processes: Less disorganized  Descriptions of Associations: Less tangential more concrete  Orientation:Full (Time, Place and Person)  Thought Content: Less disorganized and delusional, improved auditory hallucinations  History of  Schizophrenia/Schizoaffective disorder:Yes  Duration of Psychotic Symptoms:Greater than six months  Hallucinations:No data recorded  Decreased auditory hallucinations Ideas of Reference:Decreased to paranoia Suicidal Thoughts:No data recorded  Denies passive or active SI Homicidal Thoughts:No data recorded  Denies HI  Sensorium  Memory: Immediate Fair  Judgment: Limited  Insight: Limited   Executive Functions  Concentration: Improved yet limited  Attention Span: Improved yet limited  Recall: [employer] of Knowledge: Fair  Language: Fair   Psychomotor Activity  Psychomotor Activity: No data recorded    Assets  Assets: Communication Skills   Sleep  Sleep: No data recorded     Physical Exam: Physical exam: Please see exam on admit note. General: Well developed, well nourished.  Pupils: Normal at 3mm Respiratory: Breathing is unlabored.  Cardiovascular: No edema.  Language: No anomia, no aphasia Muscle strength and tone-pt moving all extremities.  Gait not assessed as pt remained in bed.  Neuro: Facial muscles are symmetric. Pt without tremor, no evidence of hyperarousal.  Review of Systems  Constitutional: Negative.   HENT: Negative.    Eyes: Negative.   Respiratory: Negative.    Cardiovascular: Negative.   Gastrointestinal: Negative.   Genitourinary: Negative.   Musculoskeletal: Negative.   Skin: Negative.   Neurological: Negative.   Endo/Heme/Allergies: Negative.   Psychiatric/Behavioral:  Positive for hallucinations and substance abuse. The patient is nervous/anxious.    Blood pressure 109/71, pulse 78, temperature 98.2 F (36.8 C), temperature source Oral, resp. rate 16, weight 93.9 kg, SpO2 99%. Body mass index is 26.58 kg/m.   Treatment Plan Summary: Daily contact with patient to assess and evaluate symptoms and progress in treatment, Medication management, and Plan as noted below  5/16 patient seems to have some  improvement in his psychosis with current treatment regimen since titrated risperidone  from 2 to 3 mg twice daily, will follow.  5/20: Patient poor historian but states he has tried Risperdal  and Haldol  previously and they were ineffective. Tells  me "I saw a Dr. Lugon in the 90's for psychiatric" but otherwise unable to elaborate on dosing or duration. Patient remains highly anxious, irritable, restless and had been entering other patient's room requiring 1:1 due to command AH telling him to enter other peoples rooms. NO EPS or akathisia on evaluation.   5/21: patient requesting Clozaril trial stating Prolixin , Haldol , and Risperdal  have not decreased the volume of AH. Patient does appear calmer and less intrusive and agitated today however also reports some morning dizziness with low BP this morning. Will reduce Prolixin  dosing to 5 mg BID and complete Clozapine trial per patient request after discussing r/b/a including but not limited to: constipation, ileus, myocarditis, agrunulocytosis, required blood monitoring, increased seizure risk, lethargy, weight gain, sedation, siallorhea, tachycardia.  5/23: patient no longer dizzy or having chest pain. Prolixin  dose lowered and BP is back to normal. Tolerating Clozapine and requesting increased dose. Reports intrusive AH of multiple voices although denies SI, HI. Denies they are command in nature.   5/24: patient has been calm and cooperative. Denies SI, HI or AVH. Some treatment resistant AH which he describes as whispers that are not command in nature. Patient has had improvement since admission when he was first completely unable to participate and disorganized. Agreeable to further increase of Clozapine at night. Limited resources without insurance for ACTs team. Plan for discharge next week.   Treatment Plan Summary: Daily contact with patient to assess and evaluate symptoms and progress in treatment, Medication management, and Plan     Observation  Level/Precautions:  15 minute checks  Laboratory:  CBC Chemistry Profile Folic Acid HbAIC HCG UDS UA Vitamin B-12  Psychotherapy:  denies  Medications:  olanzapine , Cogentin   Consultations:    Discharge Concerns:  stabilization of psychosis and LAI  Estimated LOS: 5-7 days  Other:      Safety and Monitoring: INVOLUTARILY (completed second exam) admission to inpatient psychiatric unit for safety, stabilization and treatment Daily contact with patient to assess and evaluate symptoms and progress in treatment Patient's case to be discussed in multi-disciplinary team meeting Observation Level : q15 minute checks Vital signs: q12 hours Precautions: suicide, elopement, and assault   2. Psychiatric Problems #schizoaffective disorder bipolar type - 5/16 Depakote  was titrated to 750 mg twice daily and obtain another Depakote  level on 5/20 Depakote  level low therapeutic 47 on 5/15  -cont cogentin  1 mg BID for EPS prophylaxis -cont Prolixin  to 5 mg BID due to low BP and dizziness this morning (patient primarily describes orthostatic) patient does appear calmer, less intrusive and agitated however states he does not feel it has lowered the AH - increase Clozapine 75 mg qhs -cont Bowel regimen of Docusate 100 mg daily and Miralax 35 mg daily - discussed with inpatient pharmacist, ordered baseline CBC with diff prior to starting Clozapine will repeat in 4-7 days.  On my review of documentation patient with dubious response to Prolixin  during previous hospitalization, will consider using Prolixin  LAI due to history of poor compliance and followup depending on respond to Clozapine; patient requesting to try Clozapine as states he has not noticed any benefit from trials of Risperdal , Haldol , Invega  on prior hospitalizations in decreasing his AH which are intrusive and bothersome and have led to agitation throughout hospitalization    - cont Ativan  1 mg q12h  for high anxiety, irritability and  mood lability with last dose on  5/18 - Continue trazodone  200 mg at bedtime scheduled for insomnia      3.  Medical Management Covid negative CMP: wnl,calcium 8.5 CBC: unremarkable EtOH: <10 UDS: + amphetamine , +THC TSH: ordered A1C:  ordered, pending Lipids: triglycerides elevated otherwise wnl Depakote  level 5/15 low therapeutic 47 Depakote  level due 5/20   #HTN -cont amlodipine  5 mg qdaily   Physician Treatment Plan for Primary Diagnosis: Schizophrenia, unspecified (HCC) Long Term Goal(s): Improvement in symptoms so as ready for discharge   Short Term Goals: Ability to identify changes in lifestyle to reduce recurrence of condition will improve, Ability to verbalize feelings will improve, Ability to disclose and discuss suicidal ideas, Ability to demonstrate self-control will improve, Ability to identify and develop effective coping behaviors will improve, Ability to maintain clinical measurements within normal limits will improve, Compliance with prescribed medications will improve, and Ability to identify triggers associated with substance abuse/mental health issues will improve   Physician Treatment Plan for Secondary Diagnosis: Principal Problem:   Schizophrenia, unspecified (HCC)       I certify that inpatient services furnished can reasonably be expected to improve the patient's condition.      Nalini Alcaraz, MD 01/27/2024, 1:54 PM

## 2024-01-27 NOTE — Plan of Care (Signed)
  Problem: Education: Goal: Emotional status will improve Outcome: Progressing Goal: Verbalization of understanding the information provided will improve Outcome: Progressing   Problem: Health Behavior/Discharge Planning: Goal: Identification of resources available to assist in meeting health care needs will improve Outcome: Progressing

## 2024-01-27 NOTE — Progress Notes (Signed)
   01/26/24 2045  Psych Admission Type (Psych Patients Only)  Admission Status Involuntary  Psychosocial Assessment  Patient Complaints None  Eye Contact Brief  Facial Expression Flat  Affect Appropriate to circumstance  Speech Soft  Interaction Isolative  Motor Activity Slow  Appearance/Hygiene Improved  Behavior Characteristics Cooperative;Appropriate to situation  Mood Preoccupied;Pleasant  Aggressive Behavior  Effect No apparent injury  Thought Process  Coherency WDL  Content Preoccupation  Delusions None reported or observed  Perception Hallucinations  Hallucination Auditory  Judgment Poor  Confusion None  Danger to Self  Current suicidal ideation? Denies

## 2024-01-27 NOTE — [facility] Group Notes (Signed)
 Adult Psychoeducational Group Note  Date:  01/27/2024 Time:  6:44 PM  Group Topic/Focus:  Goals Group:   The focus of this group is to help patients establish daily goals to achieve during treatment and discuss how the patient can incorporate goal setting into their daily lives to aide in recovery. Orientation:   The focus of this group is to educate the patient on the purpose and policies of crisis stabilization and provide a format to answer questions about their admission.  The group details unit policies and expectations of patients while admitted.  Participation Level:  Did Not Attend  Participation Quality:    Affect:    Cognitive:    Insight:   Engagement in Group:    Modes of Intervention:    Additional Comments:    William Romero 01/27/2024, 6:44 PM

## 2024-01-27 NOTE — Progress Notes (Signed)
   01/27/24 2240  Psych Admission Type (Psych Patients Only)  Admission Status Involuntary  Psychosocial Assessment  Patient Complaints None  Eye Contact Brief  Facial Expression Flat  Affect Appropriate to circumstance  Speech Soft  Interaction Isolative  Motor Activity Slow  Appearance/Hygiene Improved  Behavior Characteristics Appropriate to situation  Mood Pleasant  Aggressive Behavior  Effect No apparent injury  Thought Process  Coherency WDL  Content Preoccupation  Delusions None reported or observed  Perception Hallucinations  Hallucination Auditory  Judgment Poor  Confusion None  Danger to Self  Current suicidal ideation? Denies

## 2024-01-28 DIAGNOSIS — F200 Paranoid schizophrenia: Secondary | ICD-10-CM | POA: Diagnosis not present

## 2024-01-28 MED ORDER — SENNOSIDES-DOCUSATE SODIUM 8.6-50 MG PO TABS
1.0000 | ORAL_TABLET | Freq: Two times a day (BID) | ORAL | Status: DC
Start: 2024-01-28 — End: 2024-01-29
  Administered 2024-01-28 – 2024-01-29 (×3): 1 via ORAL
  Filled 2024-01-28: qty 1
  Filled 2024-01-28: qty 14
  Filled 2024-01-28 (×2): qty 1

## 2024-01-28 MED ORDER — CLOZAPINE 100 MG PO TABS
100.0000 mg | ORAL_TABLET | Freq: Every day | ORAL | Status: DC
  Administered 2024-01-28: 100 mg via ORAL
  Filled 2024-01-28: qty 1
  Filled 2024-01-28: qty 10

## 2024-01-28 MED ORDER — POLYETHYLENE GLYCOL 3350 17 G PO PACK
17.0000 g | PACK | Freq: Every day | ORAL | Status: DC
  Administered 2024-01-28: 17 g via ORAL
  Filled 2024-01-28 (×16): qty 1

## 2024-01-28 MED ORDER — BENZTROPINE MESYLATE 1 MG/ML IJ SOLN: 2.0000 mg | Freq: Four times a day (QID) | INTRAMUSCULAR | Status: DC | PRN

## 2024-01-28 MED ORDER — CLOZAPINE 25 MG PO TABS: 75.0000 mg | ORAL_TABLET | Freq: Every day | ORAL | Status: DC

## 2024-01-28 NOTE — Plan of Care (Signed)
  Problem: Activity: Goal: Sleeping patterns will improve Outcome: Progressing   Problem: Coping: Goal: Ability to demonstrate self-control will improve Outcome: Progressing   Problem: Safety: Goal: Periods of time without injury will increase Outcome: Progressing   

## 2024-01-28 NOTE — Progress Notes (Signed)
 [facility] MD Progress Note  01/28/2024 2:27 PM William Romero  MRN:  [id number]    Subjective:   Patient was assessed in his room and reports anxiety. Reports some constipation this morning and patient started on BID docusate/senna + Miralax. States he had a good bowel movement after. Cogentin  discontinued and patient denies any EPS. Reports now improvement in AH of "whispers." That he states are derogatory but he states he can ignore them. Patient states his goal is to find a job. Patient is not agitated, denies SI, HI. Denies command AH. Agreeable to further Clozapine titration. Notes Ativan  is helping with anxiety. Reports previous dizziness is resolved. Patient poor historian but states he has tried Risperdal  and Haldol  previously and they were ineffective. Denies EPS or TD reports fair sleep last night and fair appetite.  Principal Problem: Schizophrenia (HCC) Diagnosis: Principal Problem:   Schizophrenia (HCC)  Total Time spent with patient: 35 minutes  Past Psychiatric History:  Current psychiatrist: Previously seen by Dr. Augusta Blizzard 04/13/2023, poor followup Current therapist: none. Previous reported psychiatric diagnoses:  ?ADHD, schizoaffective disorder bipolar type, GAD Current psychiatric medications: none. Psychiatric medication history/compliance: Per chart review, poor with the exception of Adderall.  Full med trials include: adderall, bupropion, duloxetine , haloperidol , hydroxyzine  (ineffective), lorazepam , olanzapine , ziprasidone , paliperidone , risperidone ,  Psychiatric hospitalization(s): Approximately 6 stays over the past from 2012-2019.  Most recently, patient had 3 separate stays at [facility] in 2024, 6/11 - 6/19 and 6/22 - 7/1, and on 10/2023 Psychotherapy history: None Neuromodulation history: none. History of suicide (obtained from HPI): Reported 1 attempted OD in distant past, presently denied History of homicide or aggression (obtained in HPI): none.  Past Medical History:   Past Medical History:  Diagnosis Date   Paranoid schizophrenia (HCC) 10/03/2023   Schizophrenia (HCC)     Past Surgical History:  Procedure Laterality Date   APPENDECTOMY     Family History: History reviewed. No pertinent family history. Family Psychiatric  History:  Denies family psychiatric history  Social History:  Social History   Substance and Sexual Activity  Alcohol Use Not Currently   Alcohol/week: 5.0 standard drinks of alcohol   Types: 5 Cans of beer per week     Social History   Substance and Sexual Activity  Drug Use No    Social History   Socioeconomic History   Marital status: Single    Spouse name: Not on file   Number of children: Not on file   Years of education: Not on file   Highest education level: Not on file  Occupational History   Not on file  Tobacco Use   Smoking status: Former    Current packs/day: 0.50    Types: Cigarettes   Smokeless tobacco: Never  Vaping Use   Vaping status: Never Used  Substance and Sexual Activity   Alcohol use: Not Currently    Alcohol/week: 5.0 standard drinks of alcohol    Types: 5 Cans of beer per week   Drug use: No   Sexual activity: Not Currently  Other Topics Concern   Not on file  Social History Narrative   Not on file   Social Drivers of Health   Financial Resource Strain: Not on file  Food Insecurity: No Food Insecurity (01/25/2024)   Hunger Vital Sign    Worried About Running Out of Food in the Last Year: Never true    Ran Out of Food in the Last Year: Never true  Transportation Needs: No Transportation Needs (01/25/2024)  PRAPARE - Administrator, Civil Service (Medical): No    Lack of Transportation (Non-Medical): No  Physical Activity: Not on file  Stress: Not on file  Social Connections: Not on file   Additional Social History:                         Sleep: Fair  Appetite:  Fair  Current Medications: Current Facility-Administered Medications  Medication  Dose Route Frequency Provider Last Rate Last Admin   acetaminophen  (TYLENOL ) tablet 650 mg  650 mg Oral Q6H PRN Deeya Richeson, MD   650 mg at 01/28/24 0813   alum & mag hydroxide-simeth (MAALOX/MYLANTA) 200-200-20 MG/5ML suspension 30 mL  30 mL Oral Q4H PRN Khaya Theissen, MD       benztropine  mesylate (COGENTIN ) injection 2 mg  2 mg Intramuscular Q6H PRN Lateya Dauria, MD       cloZAPine (CLOZARIL) tablet 100 mg  100 mg Oral QHS Lutricia Widjaja, MD       haloperidol  (HALDOL ) tablet 5 mg  5 mg Oral TID PRN Devarion Mcclanahan, MD       And   diphenhydrAMINE  (BENADRYL ) capsule 50 mg  50 mg Oral TID PRN Jlon Betker, MD       haloperidol  lactate (HALDOL ) injection 5 mg  5 mg Intramuscular TID PRN Drae Mitzel, MD       And   diphenhydrAMINE  (BENADRYL ) injection 50 mg  50 mg Intramuscular TID PRN Shady Bradish, MD       And   LORazepam  (ATIVAN ) injection 2 mg  2 mg Intramuscular TID PRN Keira Bohlin, MD       haloperidol  lactate (HALDOL ) injection 10 mg  10 mg Intramuscular TID PRN Blessings Inglett, MD       And   diphenhydrAMINE  (BENADRYL ) injection 50 mg  50 mg Intramuscular TID PRN Willson Lipa, MD       And   LORazepam  (ATIVAN ) injection 2 mg  2 mg Intramuscular TID PRN Rayneisha Bouza, MD       divalproex  (DEPAKOTE ) DR tablet 750 mg  750 mg Oral Q12H Shanequa Whitenight, MD   750 mg at 01/28/24 7846   fluPHENAZine  (PROLIXIN ) tablet 5 mg  5 mg Oral Q12H Lianna Sitzmann, MD   5 mg at 01/28/24 9629   hydrOXYzine  (ATARAX ) tablet 50 mg  50 mg Oral TID PRN Genny Caulder, MD   50 mg at 01/27/24 1705   LORazepam  (ATIVAN ) tablet 1 mg  1 mg Oral Q12H Reneta Niehaus, MD   1 mg at 01/28/24 5284   magnesium  hydroxide (MILK OF MAGNESIA) suspension 30 mL  30 mL Oral Daily PRN Dayden Viverette, MD       mirtazapine  (REMERON  SOL-TAB) disintegrating tablet 15 mg  15 mg Oral QHS Moneka Mcquinn, MD   15 mg at 01/27/24 2106   nicotine  (NICODERM CQ  - dosed in mg/24 hours) patch 21 mg  21 mg Transdermal Q0600 Desani Sprung, MD   21 mg  at 01/24/24 0804   polyethylene glycol (MIRALAX / GLYCOLAX) packet 17 g  17 g Oral Daily Rukia Mcgillivray, MD   17 g at 01/28/24 0940   senna-docusate (Senokot-S) tablet 1 tablet  1 tablet Oral BID Laurens Matheny, MD   1 tablet at 01/28/24 0940   traZODone  (DESYREL ) tablet 200 mg  200 mg Oral QHS Ruperto Kiernan, MD   200 mg at 01/27/24 2106    Lab Results:  No results found for this or  any previous visit (from the past 48 hours).     Blood Alcohol level:  Lab Results  Component Value Date   [facility] <15 01/11/2024   ETH <10 10/03/2023    Metabolic Disorder Labs: Lab Results  Component Value Date   HGBA1C 5.2 01/15/2024   MPG 103 01/15/2024   MPG 99.67 10/03/2023   No results found for: "PROLACTIN" Lab Results  Component Value Date   CHOL 168 01/15/2024   TRIG 172 (H) 01/15/2024   HDL 42 01/15/2024   CHOLHDL 4.0 01/15/2024   VLDL 34 01/15/2024   LDLCALC 92 01/15/2024   LDLCALC 98 10/03/2023    Physical Findings: AIMS:  , ,  ,  ,    CIWA:    COWS:     Musculoskeletal: Strength & Muscle Tone: within normal limits Gait & Station: normal Patient leans: N/A  Psychiatric Specialty Exam:  Presentation  General Appearance:  Bizarre; Disheveled  Eye Contact: Improved  Speech: Improved, more linear  Speech Volume: Normal, not increased  Handedness: Right   Mood and Affect  Mood: Dysphoric, less anxious and less irritable  Affect: Congruent   Thought Process  Thought Processes: Less disorganized  Descriptions of Associations: Less tangential more concrete  Orientation:Full (Time, Place and Person)  Thought Content: Less disorganized and delusional, improved auditory hallucinations  History of Schizophrenia/Schizoaffective disorder:Yes  Duration of Psychotic Symptoms:Greater than six months  Hallucinations:No data recorded  Decreased auditory hallucinations Ideas of Reference:Decreased to paranoia Suicidal Thoughts:No data recorded  Denies  passive or active SI Homicidal Thoughts:No data recorded  Denies HI  Sensorium  Memory: Immediate Fair  Judgment: Limited  Insight: Limited   Executive Functions  Concentration: Improved yet limited  Attention Span: Improved yet limited  Recall: [employer] of Knowledge: Fair  Language: Fair   Psychomotor Activity  Psychomotor Activity: No data recorded    Assets  Assets: Communication Skills   Sleep  Sleep: No data recorded     Physical Exam: Physical exam: Please see exam on admit note. General: Well developed, well nourished.  Pupils: Normal at 3mm Respiratory: Breathing is unlabored.  Cardiovascular: No edema.  Language: No anomia, no aphasia Muscle strength and tone-pt moving all extremities.  Gait not assessed as pt remained in bed.  Neuro: Facial muscles are symmetric. Pt without tremor, no evidence of hyperarousal.  Review of Systems  Constitutional: Negative.   HENT: Negative.    Eyes: Negative.   Respiratory: Negative.    Cardiovascular: Negative.   Gastrointestinal: Negative.   Genitourinary: Negative.   Musculoskeletal: Negative.   Skin: Negative.   Neurological: Negative.   Endo/Heme/Allergies: Negative.   Psychiatric/Behavioral:  Positive for hallucinations and substance abuse. The patient is nervous/anxious.    Blood pressure 108/76, pulse 87, temperature 98.1 F (36.7 C), temperature source Oral, resp. rate 16, weight 93.9 kg, SpO2 100%. Body mass index is 26.58 kg/m.   Treatment Plan Summary: Daily contact with patient to assess and evaluate symptoms and progress in treatment, Medication management, and Plan as noted below  5/16 patient seems to have some improvement in his psychosis with current treatment regimen since titrated risperidone  from 2 to 3 mg twice daily, will follow.  5/20: Patient poor historian but states he has tried Risperdal  and Haldol  previously and they were ineffective. Tells me "I saw a Dr.  Lugon in the 90's for psychiatric" but otherwise unable to elaborate on dosing or duration. Patient remains highly anxious, irritable, restless and had been entering other patient's room  requiring 1:1 due to command AH telling him to enter other peoples rooms. NO EPS or akathisia on evaluation.   5/21: patient requesting Clozaril trial stating Prolixin , Haldol , and Risperdal  have not decreased the volume of AH. Patient does appear calmer and less intrusive and agitated today however also reports some morning dizziness with low BP this morning. Will reduce Prolixin  dosing to 5 mg BID and complete Clozapine trial per patient request after discussing r/b/a including but not limited to: constipation, ileus, myocarditis, agrunulocytosis, required blood monitoring, increased seizure risk, lethargy, weight gain, sedation, siallorhea, tachycardia.  5/23: patient no longer dizzy or having chest pain. Prolixin  dose lowered and BP is back to normal. Tolerating Clozapine and requesting increased dose. Reports intrusive AH of multiple voices although denies SI, HI. Denies they are command in nature.   5/24: patient has been calm and cooperative. Denies SI, HI or AVH. Some treatment resistant AH which he describes as whispers that are not command in nature. Patient has had improvement since admission when he was first completely unable to participate and disorganized. Agreeable to further increase of Clozapine at night. Limited resources without insurance for ACTs team. Plan for discharge next week.   Treatment Plan Summary: Daily contact with patient to assess and evaluate symptoms and progress in treatment, Medication management, and Plan     Observation Level/Precautions:  15 minute checks  Laboratory:  CBC Chemistry Profile Folic Acid HbAIC HCG UDS UA Vitamin B-12  Psychotherapy:  denies  Medications:  olanzapine , Cogentin   Consultations:    Discharge Concerns:  stabilization of psychosis and LAI   Estimated LOS: 5-7 days  Other:      Safety and Monitoring: INVOLUTARILY (completed second exam) admission to inpatient psychiatric unit for safety, stabilization and treatment Daily contact with patient to assess and evaluate symptoms and progress in treatment Patient's case to be discussed in multi-disciplinary team meeting Observation Level : q15 minute checks Vital signs: q12 hours Precautions: suicide, elopement, and assault   2. Psychiatric Problems #schizoaffective disorder bipolar type - 5/16 Depakote  was titrated to 750 mg twice daily and obtain another Depakote  level on 5/20 Depakote  level low therapeutic 47 on 5/15  -stop cogentin  1 mg BID for EPS prophylaxis (due to constipation) started Cogentin  2 mg q6h-PRN for acute dystonia, however patient without any EPS at this time -cont Prolixin  to 5 mg BID (experienced orthostatic hypotension at 15 mg dose)) patient does appear calmer, less intrusive and agitated however states he does not feel it has lowered the AH - increase Clozapine 100 mg at bedtime (patient received 75 mg dose, 25 mg in the morning and 50 mg at night yesterday and tolerating well)  -cont Bowel regimen of Docusate 100 mg+ senna BID and Miralax 35 mg daily - discussed with inpatient pharmacist, ordered baseline CBC with diff prior to starting Clozapine will repeat in 4-7 days, will ordered CBC for Tuesday morning  On my review of documentation patient with dubious response to Prolixin  during previous hospitalization, will consider using Prolixin  LAI due to history of poor compliance and followup depending on respond to Clozapine; patient requesting to try Clozapine as states he has not noticed any benefit from trials of Risperdal , Haldol , Invega  on prior hospitalizations in decreasing his AH which are intrusive and bothersome and have led to agitation throughout hospitalization   - cont Ativan  1 mg q12h  for high anxiety, irritability and mood lability - Continue  trazodone  200 mg at bedtime scheduled for insomnia  3. Medical Management Covid negative CMP: wnl,calcium 8.5 CBC: unremarkable EtOH: <10 UDS: + amphetamine , +THC TSH: ordered A1C:  ordered, pending Lipids: triglycerides elevated otherwise wnl Depakote  level 5/15 low therapeutic 47 Depakote  level due 5/20   #HTN -cont amlodipine  5 mg qdaily   Physician Treatment Plan for Primary Diagnosis: Schizophrenia, unspecified (HCC) Long Term Goal(s): Improvement in symptoms so as ready for discharge   Short Term Goals: Ability to identify changes in lifestyle to reduce recurrence of condition will improve, Ability to verbalize feelings will improve, Ability to disclose and discuss suicidal ideas, Ability to demonstrate self-control will improve, Ability to identify and develop effective coping behaviors will improve, Ability to maintain clinical measurements within normal limits will improve, Compliance with prescribed medications will improve, and Ability to identify triggers associated with substance abuse/mental health issues will improve   Physician Treatment Plan for Secondary Diagnosis: Principal Problem:   Schizophrenia, unspecified (HCC)       I certify that inpatient services furnished can reasonably be expected to improve the patient's condition.      Revia Nghiem, MD 01/28/2024, 2:27 PM

## 2024-01-28 NOTE — Progress Notes (Signed)
   01/28/24 2010  Psych Admission Type (Psych Patients Only)  Admission Status Involuntary  Psychosocial Assessment  Patient Complaints None  Eye Contact Brief  Facial Expression Flat  Affect Appropriate to circumstance  Speech Soft  Interaction Isolative  Motor Activity Slow  Appearance/Hygiene Improved  Behavior Characteristics Appropriate to situation  Mood Pleasant  Aggressive Behavior  Effect No apparent injury  Thought Process  Coherency WDL  Content Preoccupation  Delusions None reported or observed  Perception Hallucinations  Hallucination Auditory  Judgment Poor  Confusion None  Danger to Self  Current suicidal ideation? Denies

## 2024-01-28 NOTE — Progress Notes (Signed)
   01/28/24 1100  Psych Admission Type (Psych Patients Only)  Admission Status Involuntary  Psychosocial Assessment  Patient Complaints None  Eye Contact Brief  Facial Expression Flat  Affect Appropriate to circumstance  Speech Soft  Interaction Isolative  Motor Activity Slow  Appearance/Hygiene Improved  Behavior Characteristics Appropriate to situation  Mood Pleasant  Aggressive Behavior  Effect No apparent injury  Thought Process  Coherency WDL  Content Preoccupation  Delusions None reported or observed  Perception Hallucinations  Hallucination Auditory  Judgment Poor  Confusion None  Danger to Self  Agreement Not to Harm Self Yes  Description of Agreement verbal  Danger to Others  Danger to Others None reported or observed

## 2024-01-28 NOTE — BHH Counselor (Signed)
 CSW touched base with patient to engage in safe discharge planning.   Patient expressed that he wanted to get his automobile for "[facility], [place]" on [address]   CSW explained to patient the importance of a safe discharge plan that included the patient being discharged to a actual location.   Patient has agreed to go to the [facility]) in [place], [place].   This has been communicated to team.   CSW team to follow up as discharge approaches.   Wilian Kwong, MSW, LCSWA 01/28/2024 10:23 AM

## 2024-01-28 NOTE — Plan of Care (Signed)
  Problem: Education: Goal: Emotional status will improve Outcome: Progressing   Problem: Coping: Goal: Ability to demonstrate self-control will improve Outcome: Progressing   

## 2024-01-29 DIAGNOSIS — F200 Paranoid schizophrenia: Secondary | ICD-10-CM | POA: Diagnosis not present

## 2024-01-29 LAB — CBC WITH DIFFERENTIAL/PLATELET
Abs Immature Granulocytes: 0.04 10*3/uL (ref 0.00–0.07)
Basophils Absolute: 0 10*3/uL (ref 0.0–0.1)
Basophils Relative: 1 %
Eosinophils Absolute: 0.4 10*3/uL (ref 0.0–0.5)
Eosinophils Relative: 8 %
HCT: 44.8 % (ref 39.0–52.0)
Hemoglobin: 14.4 g/dL (ref 13.0–17.0)
Immature Granulocytes: 1 %
Lymphocytes Relative: 30 %
Lymphs Abs: 1.4 10*3/uL (ref 0.7–4.0)
MCH: 29.3 pg (ref 26.0–34.0)
MCHC: 32.1 g/dL (ref 30.0–36.0)
MCV: 91.2 fL (ref 80.0–100.0)
Monocytes Absolute: 0.4 10*3/uL (ref 0.1–1.0)
Monocytes Relative: 9 %
Neutro Abs: 2.4 10*3/uL (ref 1.7–7.7)
Neutrophils Relative %: 51 %
Platelets: 241 10*3/uL (ref 150–400)
RBC: 4.91 MIL/uL (ref 4.22–5.81)
RDW: 13.6 % (ref 11.5–15.5)
WBC: 4.7 10*3/uL (ref 4.0–10.5)
nRBC: 0 % (ref 0.0–0.2)

## 2024-01-29 LAB — COMPREHENSIVE METABOLIC PANEL WITH GFR
ALT: 16 U/L (ref 0–44)
AST: 18 U/L (ref 15–41)
Albumin: 3.6 g/dL (ref 3.5–5.0)
Alkaline Phosphatase: 66 U/L (ref 38–126)
Anion gap: 9 (ref 5–15)
BUN: 12 mg/dL (ref 6–20)
CO2: 29 mmol/L (ref 22–32)
Calcium: 8.7 mg/dL — ABNORMAL LOW (ref 8.9–10.3)
Chloride: 101 mmol/L (ref 98–111)
Creatinine, Ser: 1.1 mg/dL (ref 0.61–1.24)
GFR, Estimated: >60 mL/min (ref >60)
Glucose, Bld: 110 mg/dL — ABNORMAL HIGH (ref 70–99)
Potassium: 4.7 mmol/L (ref 3.5–5.1)
Sodium: 139 mmol/L (ref 135–145)
Total Bilirubin: 0.3 mg/dL (ref 0.0–1.2)
Total Protein: 6.9 g/dL (ref 6.5–8.1)

## 2024-01-29 MED ORDER — MIRTAZAPINE 15 MG PO TBDP: 15.0000 mg | ORAL_TABLET | Freq: Every day | ORAL | 0 refills | Status: DC

## 2024-01-29 MED ORDER — CLOZAPINE 100 MG PO TABS: 100.0000 mg | ORAL_TABLET | Freq: Every day | ORAL | 0 refills | Status: DC

## 2024-01-29 MED ORDER — BENZTROPINE MESYLATE 1 MG PO TABS: 1.0000 mg | ORAL_TABLET | Freq: Two times a day (BID) | ORAL | 0 refills | Status: DC | PRN

## 2024-01-29 MED ORDER — DIVALPROEX SODIUM 250 MG PO DR TAB
750.0000 mg | DELAYED_RELEASE_TABLET | Freq: Two times a day (BID) | ORAL | Status: DC
  Filled 2024-01-29: qty 60

## 2024-01-29 MED ORDER — POLYETHYLENE GLYCOL 3350 17 G PO PACK: 17.0000 g | PACK | Freq: Every day | ORAL | 0 refills | Status: DC

## 2024-01-29 MED ORDER — SENNOSIDES-DOCUSATE SODIUM 8.6-50 MG PO TABS: 1.0000 | ORAL_TABLET | Freq: Two times a day (BID) | ORAL | 0 refills | Status: DC

## 2024-01-29 MED ORDER — NICOTINE 21 MG/24HR TD PT24
21.0000 mg | MEDICATED_PATCH | Freq: Every day | TRANSDERMAL | 0 refills | Status: DC
Start: 2024-01-30 — End: 2024-02-03

## 2024-01-29 MED ORDER — FLUPHENAZINE HCL 5 MG PO TABS: 2.5000 mg | ORAL_TABLET | Freq: Two times a day (BID) | ORAL | 0 refills | Status: DC

## 2024-01-29 MED ORDER — DIVALPROEX SODIUM 250 MG PO DR TAB: 750.0000 mg | DELAYED_RELEASE_TABLET | Freq: Two times a day (BID) | ORAL | 0 refills | Status: DC

## 2024-01-29 MED ORDER — HYDROXYZINE HCL 50 MG PO TABS: 50.0000 mg | ORAL_TABLET | Freq: Three times a day (TID) | ORAL | 0 refills | Status: DC | PRN

## 2024-01-29 MED ORDER — FLUPHENAZINE DECANOATE 25 MG/ML IJ SOLN
12.5000 mg | INTRAMUSCULAR | Status: DC
  Administered 2024-01-29: 12.5 mg via INTRAMUSCULAR
  Filled 2024-01-29 (×2): qty 0.5

## 2024-01-29 MED ORDER — FLUPHENAZINE HCL 2.5 MG PO TABS
2.5000 mg | ORAL_TABLET | Freq: Two times a day (BID) | ORAL | Status: DC
  Filled 2024-01-29: qty 14

## 2024-01-29 MED ORDER — TRAZODONE HCL 100 MG PO TABS: 200.0000 mg | ORAL_TABLET | Freq: Every evening | ORAL | 0 refills | Status: DC | PRN

## 2024-01-29 MED ORDER — FLUPHENAZINE DECANOATE 25 MG/ML IJ SOLN: 12.5000 mg | INTRAMUSCULAR | 0 refills | Status: DC

## 2024-01-29 NOTE — Progress Notes (Signed)
 Pt discharged to lobby. Pt was stable and appreciative at that time. All papers, samples and prescriptions were given and valuables returned. Verbal understanding expressed. Denies SI/HI and A/VH. Pt given opportunity to express concerns and ask questions.

## 2024-01-29 NOTE — BHH Suicide Risk Assessment (Signed)
 [facility] Discharge Suicide Risk Assessment   Principal Problem: Schizophrenia [facility]) Discharge Diagnoses: Principal Problem:   Schizophrenia (HCC)   Total Time spent with patient: 30 minutes  Musculoskeletal: Strength & Muscle Tone: within normal limits Gait & Station: normal Patient leans: N/A  Psychiatric Specialty Exam  Presentation  General Appearance:  Bizarre; Disheveled  Eye Contact: Fleeting  Speech: Blocked  Speech Volume: Increased  Handedness: Right   Mood and Affect  Mood: Irritable  Duration of Depression Symptoms: Greater than two weeks  Affect: Congruent; Labile   Thought Process  Thought Processes: Disorganized  Descriptions of Associations:Circumstantial  Orientation:Full (Time, Place and Person)  Thought Content:Delusions; Illogical; Perseveration; Paranoid Ideation  History of Schizophrenia/Schizoaffective disorder:Yes  Duration of Psychotic Symptoms:Greater than six months  Hallucinations:No data recorded Ideas of Reference:Paranoia; Delusions; Percusatory  Suicidal Thoughts:No data recorded Homicidal Thoughts:No data recorded  Sensorium  Memory: Immediate Fair  Judgment: Poor  Insight: Poor   Executive Functions  Concentration: Fair  Attention Span: Fair  Recall: [employer] of Knowledge: Fair  Language: Fair   Psychomotor Activity  Psychomotor Activity:No data recorded  Assets  Assets: Communication Skills   Sleep  Sleep:No data recorded  Physical Exam: Physical Exam ROS Blood pressure 110/87, pulse 85, temperature 97.7 F (36.5 C), temperature source Oral, resp. rate 16, weight 93.9 kg, SpO2 98%. Body mass index is 26.58 kg/m.  Mental Status Per Nursing Assessment::   On Admission:     Demographic Factors:  Male, Low socioeconomic status, Living alone, and Unemployed  Loss Factors: Decrease in vocational status and Financial problems/change in socioeconomic status  Historical  Factors: NA  Risk Reduction Factors:   Positive coping skills or problem solving skills  Continued Clinical Symptoms:  Previous Psychiatric Diagnoses and Treatments  Cognitive Features That Contribute To Risk:  None    Suicide Risk:  Minimal: No identifiable suicidal ideation.  Patients presenting with no risk factors but with morbid ruminations; may be classified as minimal risk based on the severity of the depressive symptoms  Patient denies SI or HI for >48 hours. Denies wanting to be dead and future oriented. SRA complete and acute risk for suicide is low.   [place] Suicide Risk assessment:  1. Do you wish to be dead? NONE REPORTED 2. Have you wished your dead or wished you could go to sleep and not wake up? NONE REPORTED 3.  Have you actually had thoughts of killing yourself?  NONE REPORTED 4.  Have you been thinking about how you might do this?  NONE REPORTED 5.  Have you had these thoughts and some intention of acting on them? NONE REPORTED 6.  Have you started to work out or worked out the details to kill yourself? NONE REPORTED 7.  Do you intend to carry out this plan? NONE REPORTED 8. On a scale of 1-5 with 1 being the least severe and 5 being the most severe answer the following questions place for intensity of ideation. ZERO 9. How many times have you had these thoughts? NONE REPORTED 10. When you have the thoughts how long to the last?  NONE REPORTED 11. Control ability.  Could you or can you stop thinking about killing herself or wanting to die if you want to?  YES 12. Are there any things anyone or anything family religion pain of death that stop you from wanting to die or acting on thoughts of committing suicide?  FAMILY 13.  What sort of reason to do have to think about  wanting to die or killing yourself? NONE REPORTED 14.Was it to end the pain or stop the way you are feeling in other words you could not go on living with his pain or how you are feeling or was not to  get attention revenge or reaction from others?  Or both?  NONE REPORTED  [place] Suicide Risk assessment:  1. Do you wish to be dead? NONE REPORTED 2. Have you wished your dead or wished you could go to sleep and not wake up? NONE REPORTED 3.  Have you actually had thoughts of killing yourself?  NONE REPORTED 4.  Have you been thinking about how you might do this?  NONE REPORTED 5.  Have you had these thoughts and some intention of acting on them? NONE REPORTED 6.  Have you started to work out or worked out the details to kill yourself? NONE REPORTED 7.  Do you intend to carry out this plan? NONE REPORTED 8. On a scale of 1-5 with 1 being the least severe and 5 being the most severe answer the following questions place for intensity of ideation. ZERO 9. How many times have you had these thoughts? NONE REPORTED 10. When you have the thoughts how long to the last?  NONE REPORTED 11. Control ability.  Could you or can you stop thinking about killing herself or wanting to die if you want to?  YES 12. Are there any things anyone or anything family religion pain of death that stop you from wanting to die or acting on thoughts of committing suicide?  "I want to get a job" 13.  What sort of reason to do have to think about wanting to die or killing yourself? NONE REPORTED 14.Was it to end the pain or stop the way you are feeling in other words you could not go on living with his pain or how you are feeling or was not to get attention revenge or reaction from others?  Or both?  NONE REPORTED    Follow-up Information     [place] Go on 01/31/2024.   Specialty: Professional Counselor Why: Please go to this provider for an assessment on 01/31/24 at 9:00 am, to obtain therapy services.  You may also go Monday through Friday, from 9 am to 1 pm. Contact information: [address]  [address] [place] [address] [phone]         [facility].  Go on 02/01/2024.   Specialty: Behavioral Health Why: Please go to this provider for an assessment on 02/01/24 at 7:00 am, to obtain medication management services. You may also go Monday through Friday, arrive by 7:00 am. Contact information: [address] [place]  [address] [phone]                Plan Of Care/Follow-up recommendations:  Outpatient psychiatric followup as above for LAI and Clozaril lab draws - patient did not have sufficient insurance for ACTs team referral per social workers  Shalana Jardin, MD 01/29/2024, 12:02 PM

## 2024-01-29 NOTE — Discharge Summary (Signed)
 Physician Discharge Summary Note  Patient:  William Romero is an 43 y.o., male MRN:  [id number] DOB:  1981-01-20 Patient phone:  [phone] (home)  Patient address:   [address] [facility] Rom[facility] [address],  Total Time spent with patient: 30 minutes  Date of Admission:  01/23/2024 Date of Discharge: 01/29/24  Reason for Admission:   Per H&P: "43 y.o. male with a past psychiatric history of schizophrenia, reported ADHD with history of concern for stimulant dependence, GAD, and episodes of depression who prsented to ed yesterday with acute worsening psychosis. UDS + amphetamines and THC. Reviewed PDMP and patient with monthly fills of Adderall despite previous recommendations for cessation after inpatient psychiatric admission.      Patient seen for readmission in evening due to continued psychosis, agitation and intrusive behaviors. Patient requiring continued redirection and 1:1. Reports intrusive AH of multiple voices of a derogatory and sexual nature and states they are saying "sexual things... it's voodoo from my grandmother." Denies to be they are command in nature at this time however patient is irritable and wishes not to discuss. Patient reports that the Ativan  was helping his anxiety and is interested in restarting but has not felt any antipsychotic to be helpful. Patient is floridly psychotic seen responding to internal stimuli and with delayed responses to due to thought blocking. Patand with paranoid persecutory delusions about his family. States his "grandmother is capable of lust and rape."  Patient denies SI or HI although admits the voices are bothersome and difficult to deal with.  Patient poor historian and states not medications have helped him in the past and  that it's "voodoo you need to understand voodoo." Patient irritable and states he does not want to talk anymore. Patient will not elaborate on how much Adderall he has been using stating he is not using it. Patient  denies SI or HI today but notes he feels hopeless "because the voices won't go away." Denies anhedonia. Reports poor concentration and high anxiety stating he is afraid to go to sleep at night because of the voices. Patient overall is poor historian at this time and refuses to answer when asked about manic episodes.    Patient adamantly refuses for me to speak to his mother for collateral William Romero) or any other family members stating "They've turned against me with voodoo, it's been going on for years, you don't understand."  "  Principal Problem: Schizophrenia [facility]) Discharge Diagnoses: Principal Problem:   Schizophrenia [facility])   Past Psychiatric History:  Current psychiatrist: Previously seen by Dr. Augusta Romero 04/13/2023, poor followup Current therapist: none. Previous reported psychiatric diagnoses:  ?ADHD, schizoaffective disorder bipolar type, GAD Current psychiatric medications: none. Psychiatric medication history/compliance: Per chart review, poor with the exception of Adderall.  Full med trials include: adderall, bupropion, duloxetine , haloperidol , hydroxyzine  (ineffective), lorazepam , olanzapine , ziprasidone , paliperidone , risperidone ,  Psychiatric hospitalization(s): Approximately 6 stays over the past from 2012-2019.  Most recently, patient had 3 separate stays at [facility] in 2024, 6/11 - 6/19 and 6/22 - 7/1, and on 10/2023 Psychotherapy history: None Neuromodulation history: none. History of suicide (obtained from HPI): Reported 1 attempted OD in distant past, presently denied History of homicide or aggression (obtained in HPI): none.    Past Medical History:  Past Medical History:  Diagnosis Date   Paranoid schizophrenia (HCC) 10/03/2023   Schizophrenia (HCC)     Past Surgical History:  Procedure Laterality Date   APPENDECTOMY     Family History: History reviewed. No pertinent family  history. Family Psychiatric  History: Psych:Denies family psychiatric history  Social  History:  Social History   Substance and Sexual Activity  Alcohol Use Not Currently   Alcohol/week: 5.0 standard drinks of alcohol   Types: 5 Cans of beer per week     Social History   Substance and Sexual Activity  Drug Use No    Social History   Socioeconomic History   Marital status: Single    Spouse name: Not on file   Number of children: Not on file   Years of education: Not on file   Highest education level: Not on file  Occupational History   Not on file  Tobacco Use   Smoking status: Former    Current packs/day: 0.50    Types: Cigarettes   Smokeless tobacco: Never  Vaping Use   Vaping status: Never Used  Substance and Sexual Activity   Alcohol use: Not Currently    Alcohol/week: 5.0 standard drinks of alcohol    Types: 5 Cans of beer per week   Drug use: No   Sexual activity: Not Currently  Other Topics Concern   Not on file  Social History Narrative   Not on file   Social Drivers of Health   Financial Resource Strain: Not on file  Food Insecurity: No Food Insecurity (01/25/2024)   Hunger Vital Sign    Worried About Running Out of Food in the Last Year: Never true    Ran Out of Food in the Last Year: Never true  Transportation Needs: No Transportation Needs (01/25/2024)   PRAPARE - Administrator, Civil Service (Medical): No    Lack of Transportation (Non-Medical): No  Physical Activity: Not on file  Stress: Not on file  Social Connections: Not on file    Hospital Course:    Patient was admitted to inpatient psychiatry at [facility] for safety and stabilization. Patient was provided safe and therapeutic milieu, psychiatric and medical assessment, care and treatment, as well as support from nursing, behavioral health staff. Both psychotherapy and psychoeducation groups were provided. Different coping skills such as journaling, CBT and art therapy groups were offered. Additional consultation was provided by hospitalist for H&P and medical  needs.  Patient was started on Depakote  and Risperdal  (but did not have any improvement in psychotic symptoms after a week) for treatment resistant schizoaffective disorder (by history). Patient reported failing multiple antipsychotic trials including Risperdal , Haldol . Patient was switched to Prolixin  which was titrated to 5 mg TID but had to be lowered to 5 mg BID due to orthostatic hypotension. Patient had some improvement in his symptoms with improved organization of thoughts, improved sleep and resolution of agitation however continued to experience residual intrusive and treatment resistant AH of voices and paranoid delusions about his family inserting these voices. He denied SI or HI for >1 week prior to discharge. R/B/A of Clozapine were discussed and patient wanted to complete a Clozapine trial. Patient's Clozapine was titrated to 100 mg with good effect with patient reporting improvement in AH and paranoia. Patient was calm, cooperative and organized for 1 week prior to discharge. HE requested discharge on 5/26 with outpatient followup at [facility] (did not quality for ACT services due to lack of sufficient insurance) Patient no longer met IVC criteria and staff noted that patient was at baseline relative to prior admissions. Patient agreed to LAI of Prolixin  decanoate of 12.5 mg and tolerated without side efffects. Oral Prolixin  dose was decreased to 2.5 mg BID and patient  was provided samples as well as 30 day scripts for his medications and for Prolixin  with next dose of 12.5 mg IM due in 21 days on 02/19/24. Patient experienced constipation 2 days prior to discharge and Cogentin  1 mg BID was changed to PRN without any emergency of EPS symptoms or dystonia.     Patient tolerated without side effects and medications were titrated to therapeutic effect. As patient stabilized on medications and participated in therapeutic interventions, symptoms began to improve.  On the day of discharge, the chart was  reviewed, case was discussed with staff and the patient was seen in person. Patient's overall mood has improved. Patient was calm and cooperative and did not appear anxious. Patient reports feeling "Good" and is asking for a ride to shelter so he can walk to his car. States he will followup at [facility] and we discussed importance of followup and blood draws with Clozapine. Reports the voices "are almost gone." And states Prolixin  and Clozapine combination are helping. Denies side effects. Patient reported adequate sleep and stable mood. Patient was tolerating medications well without side effects reported or noted. Patient denied suicidal ideation, plan or intent, denied hopelessness, helplessness or worthlessness, and denied homicidal ideation. Insight and judgement have improved. Patient demonstrated future orientation and was motivated to follow-up with aftercare. Patient was encouraged to be adherent with medications. Patient was instructed to call 911, ask for help to go to the closest emergency room or crisis center, call crisis hotlines for help if in critical status or when symptoms were worsening. Patient voiced understanding of this information. At the time of discharge, patient had reached maximum benefit from hospitalization, was no longer considered to be dangerous to self or others, and was psychiatrically stable and otherwise appropriate for discharge to less restrictive care in the community.   Medical Hospital Course: Patient was seen by the hospitalist for routine admission examination. Medications for chronic conditions were continued.  Medical hospital course was otherwise unremarkable.    Musculoskeletal: Strength & Muscle Tone: within normal limits Gait & Station: normal Patient leans: N/A   Psychiatric Specialty Exam:  Presentation  General Appearance:  Appropriate for Environment  Eye Contact: Fair  Speech: Clear and Coherent  Speech  Volume: Normal  Handedness: Right   Mood and Affect  Mood: Euthymic  Affect: Congruent   Thought Process  Thought Processes: Coherent  Descriptions of Associations:Intact  Orientation:Full (Time, Place and Person)  Thought Content:Logical  History of Schizophrenia/Schizoaffective disorder:Yes  Duration of Psychotic Symptoms:Greater than six months  Hallucinations:Hallucinations: None Description of Auditory Hallucinations: denies today  Ideas of Reference:None  Suicidal Thoughts:Suicidal Thoughts: No  Homicidal Thoughts:Homicidal Thoughts: No   Sensorium  Memory: Immediate Fair  Judgment: Fair  Insight: Fair   Art therapist  Concentration: Fair  Attention Span: Fair  Recall: [employer] of Knowledge: Fair  Language: Fair   Psychomotor Activity  Psychomotor Activity: Psychomotor Activity: Normal   Assets  Assets: Communication Skills; Desire for Improvement; Physical Health; Resilience; Transportation   Sleep  Sleep: Sleep: Fair    Physical Exam: Physical Exam ROS Blood pressure 110/87, pulse 85, temperature 97.7 F (36.5 C), temperature source Oral, resp. rate 16, weight 93.9 kg, SpO2 98%. Body mass index is 26.58 kg/m.   Social History   Tobacco Use  Smoking Status Former   Current packs/day: 0.50   Types: Cigarettes  Smokeless Tobacco Never   Tobacco Cessation:  A prescription for an FDA-approved tobacco cessation medication provided at discharge   Blood  Alcohol level:  Lab Results  Component Value Date   [facility] <15 01/11/2024   ETH <10 10/03/2023    Metabolic Disorder Labs:  Lab Results  Component Value Date   HGBA1C 5.2 01/15/2024   MPG 103 01/15/2024   MPG 99.67 10/03/2023   No results found for: "PROLACTIN" Lab Results  Component Value Date   CHOL 168 01/15/2024   TRIG 172 (H) 01/15/2024   HDL 42 01/15/2024   CHOLHDL 4.0 01/15/2024   VLDL 34 01/15/2024   LDLCALC 92 01/15/2024   LDLCALC  98 10/03/2023    See Psychiatric Specialty Exam and Suicide Risk Assessment completed by Attending Physician prior to discharge.  Discharge destination:  Other:  shelter [facility]  Is patient on multiple antipsychotic therapies at discharge:  Yes,   Do you recommend tapering to monotherapy for antipsychotics?  No   Has Patient had three or more failed trials of antipsychotic monotherapy by history:  Yes,   Antipsychotic medications that previously failed include:   1.  RIsperdal ., 2.  Haldol ., and 3.  Prolixin  (continued residual symptoms including paranoid delusions and AH of whispers although command AH resolved and agitation decreased (patient requested Clozapine trial and had benefit).  Recommended Plan for Multiple Antipsychotic Therapies: Second antipsychotic is Clozapine.  Reason for adding Clozapine treatment resistant schizophrenia  Discharge Instructions     Ambulatory referral to Cardiology   Complete by: As directed    If you have not heard from the Cardiology office within the next 72 hours please call [phone].      Allergies as of 01/29/2024   No Known Allergies      Medication List     STOP taking these medications    LORazepam  1 MG tablet Commonly known as: ATIVAN    nicotine  14 mg/24hr patch Commonly known as: NICODERM CQ  - dosed in mg/24 hours Replaced by: nicotine  21 mg/24hr patch   nicotine  polacrilex 2 MG gum Commonly known as: NICORETTE        TAKE these medications      Indication  benztropine  1 MG tablet Commonly known as: COGENTIN  Take 1 tablet (1 mg total) by mouth 2 (two) times daily as needed (for muscle spasm or dystonia only). What changed:  when to take this reasons to take this  Indication: Extrapyramidal Reaction caused by Medications   cloZAPine 100 MG tablet Commonly known as: CLOZARIL Take 1 tablet (100 mg total) by mouth at bedtime.  Indication: Schizophrenia that does Not Respond to Usual Drug Therapy   divalproex  250 MG  DR tablet Commonly known as: DEPAKOTE  Take 3 tablets (750 mg total) by mouth 2 (two) times daily. What changed: when to take this  Indication: Schizophrenia   fluPHENAZine  5 MG tablet Commonly known as: PROLIXIN  Take 0.5 tablets (2.5 mg total) by mouth 2 (two) times daily. What changed:  how much to take when to take this  Indication: Schizophrenia   fluPHENAZine  decanoate 25 MG/ML injection Commonly known as: PROLIXIN  Inject 0.5 mLs (12.5 mg total) into the muscle every 21 ( twenty-one) days.  Indication: Schizophrenia   hydrOXYzine  50 MG tablet Commonly known as: ATARAX  Take 1 tablet (50 mg total) by mouth 3 (three) times daily as needed for anxiety. What changed:  medication strength how much to take  Indication: Feeling Anxious   mirtazapine  15 MG disintegrating tablet Commonly known as: REMERON  SOL-TAB Take 1 tablet (15 mg total) by mouth at bedtime.  Indication: anxiety   nicotine  21 mg/24hr patch Commonly known as: NICODERM  CQ - dosed in mg/24 hours Place 1 patch (21 mg total) onto the skin daily at 6 (six) AM. Start taking on: Jan 30, 2024 Replaces: nicotine  14 mg/24hr patch  Indication: Nicotine  Addiction   polyethylene glycol 17 g packet Commonly known as: MIRALAX / GLYCOLAX Take 17 g by mouth daily. Start taking on: Jan 30, 2024  Indication: Constipation   senna-docusate 8.6-50 MG tablet Commonly known as: Senokot-S Take 1 tablet by mouth 2 (two) times daily.  Indication: Constipation   traZODone  100 MG tablet Commonly known as: DESYREL  Take 2 tablets (200 mg total) by mouth at bedtime as needed for sleep. What changed:  when to take this reasons to take this  Indication: Trouble Sleeping        Follow-up Information     [facility] Go on 01/31/2024.   Specialty: Professional Counselor Why: Please go to this provider for an assessment on 01/31/24 at 9:00 am, to obtain therapy services.  You may also go Monday through  Friday, from 9 am to 1 pm. Contact information: [address]  [address] [place] [address] [phone]         [facility]. Go on 02/01/2024.   Specialty: Behavioral Health Why: Please go to this provider for an assessment on 02/01/24 at 7:00 am, to obtain medication management services. You may also go Monday through Friday, arrive by 7:00 am. Contact information: [address] [place]  [address] [phone]                Follow-up recommendations:  outpatient followup as above Outpatient psychiatric followup as above for LAI (Prolixin  12.5 mg q21 days with next dose due on 02/19/24) and Clozaril lab draws - patient did not have sufficient insurance for ACTs team referral per social workers  Signed: Tayen Narang, MD 01/29/2024, 12:10 PM

## 2024-01-29 NOTE — Progress Notes (Signed)
 Conversation with patient:  Patient doesn't have insurance.  CSW encouraged him to apply for Medicaid, so he could apply for Assertive Community Treatment Team (ACTT) services.  Patient said that he will go to DSS building (he knows where it's located), and will apply for Medicaid.     Zayanna Pundt, LCSWA 01/29/2024

## 2024-01-29 NOTE — Progress Notes (Signed)
  [facility] Adult Case Management Discharge Plan :  Will you be returning to the same living situation after discharge:  Yes, patient will be discharged to [facility]. At discharge, do you have transportation home?: Yes,  CSW arranged transportation through [facility]. Do you have the ability to pay for your medications: No, patient doesn't have insurance so he will receive medication samples.  Release of information consent forms completed and in the chart;  Patient's signature needed at discharge.  Patient to Follow up at:  Follow-up Information     [facility] Go on 01/31/2024.   Specialty: Professional Counselor Why: Please go to this provider for an assessment on 01/31/24 at 9:00 am, to obtain therapy services.  You may also go Monday through Friday, from 9 am to 1 pm. Contact information: [address]  [address] [place] [address] [phone]         [facility]. Go on 02/01/2024.   Specialty: Behavioral Health Why: Please go to this provider for an assessment on 02/01/24 at 7:00 am, to obtain medication management services. You may also go Monday through Friday, arrive by 7:00 am. Contact information: [address] [place]  [address] [phone]                Next level of care provider has access to [facility] Link:no  Safety Planning and Suicide Prevention discussed: Yes,  with patient  Has patient been referred to the Quitline?: Patient refused referral for treatment.  Patient said that he quit smoking a year ago, and doesn't need a referral at this time.  Patient has been referred for addiction treatment:  At admission, patient tested positive for amphetamines and marijuana.  He said he would like to stop using, and will work on it in outpatient therapy.   Daine Gunther O Jorgia Manthei, LCSWA 01/29/2024, 11:16 AM

## 2024-01-29 NOTE — [facility] Suicide Risk Assessment (Signed)
 [facility] INPATIENT:  Family/Significant Other Suicide Prevention Education  Suicide Prevention Education:  Education Completed; with patient,  (name of family member/significant other) has been identified by the patient as the family member/significant other with whom the patient will be residing, and identified as the person(s) who will aid the patient in the event of a mental health crisis (suicidal ideations/suicide attempt).  With written consent from the patient, the family member/significant other has been provided the following suicide prevention education, prior to the and/or following the discharge of the patient.  Patient was given "Suicide Prevention Information" brochure.  Patient doesn't have any guns or weapons.    The suicide prevention education provided includes the following: Suicide risk factors Suicide prevention and interventions National Suicide Hotline telephone number [facility] assessment telephone number [facility] Emergency Assistance 911 [facility] and/or Residential Mobile Crisis Unit telephone number  Request made of family/significant other to: Remove weapons (e.g., guns, rifles, knives), all items previously/currently identified as safety concern.   Remove drugs/medications (over-the-counter, prescriptions, illicit drugs), all items previously/currently identified as a safety concern.  The family member/significant other verbalizes understanding of the suicide prevention education information provided.  The family member/significant other agrees to remove the items of safety concern listed above.   Naturi Alarid O Suki Crockett, LCSWA 01/29/2024, 11:04 AM

## 2024-01-29 NOTE — Transportation (Signed)
 01/29/2024  William Romero DOB: [birth year] MRN: [id number]   RIDER WAIVER AND RELEASE OF LIABILITY  For the purposes of helping with transportation needs, [facility] partners with outside transportation providers (taxi companies, [place], Catering manager.) to give [facility] patients or other approved people the choice of on-demand rides [employer]") to our buildings for non-emergency visits.  By using [employer], I, the person signing this document, on behalf of myself and/or any legal minors (in my care using the [employer]), agree:  Science writer given to me are supplied by independent, outside transportation providers who do not work for, or have any affiliation with, [employer]. [place] is not a transportation company. [facility] has no control over the quality or safety of the rides I get using [employer]. [place] has no control over whether any outside ride will happen on time or not. [facility] gives no guarantee on the reliability, quality, safety, or availability on any rides, or that no mistakes will happen. I know and accept that traveling by vehicle (car, truck, [employer], [employer], bus, taxi, etc.) has risks of serious injuries such as disability, being paralyzed, and death. I know and agree the risk of using [employer] is mine alone, and not [employer]. Transport Services are provided "as is" and as are available. The transportation providers are in charge for all inspections and care of the vehicles used to provide these rides. I agree not to take legal action against [facility], its agents, employees, officers, directors, representatives, insurers, attorneys, assigns, successors, subsidiaries, and affiliates at any time for any reasons related directly or indirectly to using [employer]. I also agree not to take legal action against  or its affiliates for any injury, death, or damage to property caused by or related to  using [employer]. I have read this Waiver and Release of Liability, and I understand the terms used in it and their legal meaning. This Waiver is freely and voluntarily given with the understanding that my right (or any legal minors) to legal action against  relating to [employer] is knowingly given up to use these services.   I attest that I read the Ride Waiver and Release of Liability to William Romero, gave William Romero the opportunity to ask questions and answered the questions asked (if any). I affirm that William Romero then provided consent for assistance with transportation.    Carollyn Etcheverry, LCSWA 01/29/2024

## 2024-02-02 ENCOUNTER — Ambulatory Visit (HOSPITAL_COMMUNITY)
Admission: EM | Admit: 2024-02-02 | Discharge: 2024-02-03 | Disposition: A | Discharge status: Other Acute Inpatient Hospital | Attending: Family Medicine | Admitting: Family Medicine

## 2024-02-02 DIAGNOSIS — R45851 Suicidal ideations: Secondary | ICD-10-CM | POA: Insufficient documentation

## 2024-02-02 DIAGNOSIS — F200 Paranoid schizophrenia: Secondary | ICD-10-CM | POA: Diagnosis not present

## 2024-02-02 LAB — POCT URINE DRUG SCREEN - MANUAL ENTRY (I-SCREEN)
POC Amphetamine UR: NOT DETECTED
POC Buprenorphine (BUP): NOT DETECTED
POC Cocaine UR: NOT DETECTED
POC Marijuana UR: POSITIVE — AB
POC Methadone UR: NOT DETECTED
POC Methamphetamine UR: NOT DETECTED
POC Morphine: NOT DETECTED
POC Oxazepam (BZO): NOT DETECTED
POC Oxycodone UR: NOT DETECTED
POC Secobarbital (BAR): NOT DETECTED

## 2024-02-02 LAB — CBC WITH DIFFERENTIAL/PLATELET
Abs Immature Granulocytes: 0.05 10*3/uL (ref 0.00–0.07)
Basophils Absolute: 0.1 10*3/uL (ref 0.0–0.1)
Basophils Relative: 1 %
Eosinophils Absolute: 0.3 10*3/uL (ref 0.0–0.5)
Eosinophils Relative: 4 %
HCT: 42.6 % (ref 39.0–52.0)
Hemoglobin: 13.9 g/dL (ref 13.0–17.0)
Immature Granulocytes: 1 %
Lymphocytes Relative: 18 %
Lymphs Abs: 1.2 10*3/uL (ref 0.7–4.0)
MCH: 30.2 pg (ref 26.0–34.0)
MCHC: 32.6 g/dL (ref 30.0–36.0)
MCV: 92.6 fL (ref 80.0–100.0)
Monocytes Absolute: 0.5 10*3/uL (ref 0.1–1.0)
Monocytes Relative: 8 %
Neutro Abs: 4.5 10*3/uL (ref 1.7–7.7)
Neutrophils Relative %: 68 %
Platelets: 253 10*3/uL (ref 150–400)
RBC: 4.6 MIL/uL (ref 4.22–5.81)
RDW: 14.4 % (ref 11.5–15.5)
WBC: 6.6 10*3/uL (ref 4.0–10.5)
nRBC: 0 % (ref 0.0–0.2)

## 2024-02-02 LAB — COMPREHENSIVE METABOLIC PANEL WITH GFR
ALT: 23 U/L (ref 0–44)
AST: 31 U/L (ref 15–41)
Albumin: 3.6 g/dL (ref 3.5–5.0)
Alkaline Phosphatase: 55 U/L (ref 38–126)
Anion gap: 14 (ref 5–15)
BUN: 13 mg/dL (ref 6–20)
CO2: 27 mmol/L (ref 22–32)
Calcium: 8.8 mg/dL — ABNORMAL LOW (ref 8.9–10.3)
Chloride: 104 mmol/L (ref 98–111)
Creatinine, Ser: 1.08 mg/dL (ref 0.61–1.24)
GFR, Estimated: >60 mL/min (ref >60)
Glucose, Bld: 102 mg/dL — ABNORMAL HIGH (ref 70–99)
Potassium: 4.8 mmol/L (ref 3.5–5.1)
Sodium: 145 mmol/L (ref 135–145)
Total Bilirubin: 0.4 mg/dL (ref 0.0–1.2)
Total Protein: 6 g/dL — ABNORMAL LOW (ref 6.5–8.1)

## 2024-02-02 LAB — ACETAMINOPHEN LEVEL: Acetaminophen (Tylenol), Serum: 25 ug/mL (ref 10–30)

## 2024-02-02 LAB — ETHANOL: Alcohol, Ethyl (B): 23 mg/dL — ABNORMAL HIGH (ref <15)

## 2024-02-02 MED ORDER — FLUPHENAZINE HCL 2.5 MG PO TABS
2.5000 mg | ORAL_TABLET | Freq: Two times a day (BID) | ORAL | Status: DC
  Administered 2024-02-02 – 2024-02-03 (×2): 2.5 mg via ORAL
  Filled 2024-02-02 (×2): qty 1

## 2024-02-02 MED ORDER — MAGNESIUM HYDROXIDE 400 MG/5ML PO SUSP: 30.0000 mL | Freq: Every day | ORAL | Status: DC | PRN

## 2024-02-02 MED ORDER — HALOPERIDOL LACTATE 5 MG/ML IJ SOLN: 5.0000 mg | Freq: Three times a day (TID) | INTRAMUSCULAR | Status: DC | PRN

## 2024-02-02 MED ORDER — HALOPERIDOL 5 MG PO TABS: 5.0000 mg | ORAL_TABLET | Freq: Three times a day (TID) | ORAL | Status: DC | PRN

## 2024-02-02 MED ORDER — DIPHENHYDRAMINE HCL 50 MG/ML IJ SOLN
50.0000 mg | Freq: Three times a day (TID) | INTRAMUSCULAR | Status: DC | PRN
  Administered 2024-02-03: 50 mg via INTRAMUSCULAR
  Filled 2024-02-02: qty 1

## 2024-02-02 MED ORDER — LORAZEPAM 2 MG/ML IJ SOLN
2.0000 mg | Freq: Three times a day (TID) | INTRAMUSCULAR | Status: DC | PRN
  Administered 2024-02-03: 2 mg via INTRAMUSCULAR
  Filled 2024-02-02: qty 1

## 2024-02-02 MED ORDER — HALOPERIDOL LACTATE 5 MG/ML IJ SOLN: 10.0000 mg | Freq: Three times a day (TID) | INTRAMUSCULAR | Status: DC | PRN

## 2024-02-02 MED ORDER — DIPHENHYDRAMINE HCL 50 MG/ML IJ SOLN: 50.0000 mg | Freq: Three times a day (TID) | INTRAMUSCULAR | Status: DC | PRN

## 2024-02-02 MED ORDER — ALUM & MAG HYDROXIDE-SIMETH 200-200-20 MG/5ML PO SUSP: 30.0000 mL | ORAL | Status: DC | PRN

## 2024-02-02 MED ORDER — ACETAMINOPHEN 325 MG PO TABS: 650.0000 mg | ORAL_TABLET | Freq: Four times a day (QID) | ORAL | Status: DC | PRN

## 2024-02-02 MED ORDER — LORAZEPAM 2 MG/ML IJ SOLN: 2.0000 mg | Freq: Three times a day (TID) | INTRAMUSCULAR | Status: DC | PRN

## 2024-02-02 MED ORDER — DIPHENHYDRAMINE HCL 50 MG PO CAPS: 50.0000 mg | ORAL_CAPSULE | Freq: Three times a day (TID) | ORAL | Status: DC | PRN

## 2024-02-02 MED ORDER — HYDROXYZINE HCL 25 MG PO TABS
25.0000 mg | ORAL_TABLET | Freq: Three times a day (TID) | ORAL | Status: DC | PRN
  Administered 2024-02-02 – 2024-02-03 (×2): 25 mg via ORAL
  Filled 2024-02-02 (×2): qty 1

## 2024-02-02 NOTE — BH Assessment (Incomplete)
 Comprehensive Clinical Assessment (CCA) Note  02/02/2024 William Romero [id number]  DISPOSITION: Patient has been recommended for an inpatient admission as bed placement is investigated.   The patient demonstrates the following risk factors for suicide: Chronic risk factors for suicide include: N/A. Acute risk factors for suicide include: N/A. Protective factors for this patient include: coping skills. Considering these factors, the overall suicide risk at this point appears to be low. Patient is appropriate for outpatient follow up.   William Romero is a 43 year old male that presents to [facility] as a voluntary walk in escorted by [employer]. Patient reports passive SI although denies any specific plan or intent. Patient denies any HI and VH although reports active AH although initially was vague in reference to content. Patient reports that he took 5 sleeping pills last night to assist him with sleeping hoping the, "voices would stop." Patient denies any current SA issues although has a history of amphetamine  use. Per chart, he also has a past psychiatric history of schizophrenia, ADHD, psychosis and cannabis usage. Patient reports SI with no plan this date. Patient also reports feelings that someone is out to get him. Patient at the beginning of assessment stated he could not make out what the voices were saying although later reports voices are threatening his life. Patient reports current stressors to include, homelessness, voices, financial and lack of support. Patient reports worsening depressive symptoms of feeling hopeless and no motivation to, "do anything." Patient denied prior suicide attempts and self-harming behaviors.   Patient denied receiving any outpatient mental health services. Patient denied taking any psych medications at this time although states he was recently discharged with medications (See MAR) on 5/26. Patient reports he is currently homeless. Patient reports being estranged from  family and that he has no support system. Patient is currently unemployed. Patient denied access to guns. Patient was calm and cooperative during assessment. Patient feels that he would not be safe if discharged.  Patient is alert and oriented x 4.  States that he was brought to this facility by police.  States he called them to ask for help because of the AH. Patient reports he has not slept in the last 72 hours. States last night he took five acetaminophen  PM to try to sleep. States the acetaminophen  PM was ineffective, it did not cause him to sleep or even doze off.    Patient's memory appears to be intact although thoughts slightly disorganized. Patient's mood is anxious with affect congruent. Patient does not appear to be responding to internal stimuli.     Chief Complaint:  Chief Complaint  Patient presents with   Schizophrenia   Suicidal Thoughts   Visit Diagnosis: Schizophrenia     CCA Screening, Triage and Referral (STR)  Patient Reported Information How did you hear about us ? Self  What Is the Reason for Your Visit/Call Today? William Romero is a 43 year old male presenting to [facility] escorted by [employer] vol. Pt mentions he has been off his meds today and that he is hearing voices. PT is diagnosed with Schizophrenia and has been endorsing voices for some time. Pt reports that he took 5 sleeping pills last night because he could not sleep. PT endorses suicidal thoughts today, but no plan. Pt denies substance use, HI and VH.  How Long Has This Been Causing You Problems? 1 wk - 1 month  What Do You Feel Would Help You the Most Today? Medication(s)   Have You Recently Had Any Thoughts About  Hurting Yourself? Yes  Are You Planning to Commit Suicide/Harm Yourself At This time? No   Flowsheet Row ED to Hosp-Ad[facility] (Dis[facility]) [facility] from  01/11/2024 in [facility] at [facility]  C-SSRS RISK CATEGORY No Risk No Risk No Risk       Have you Recently Had Thoughts About Hurting Someone William Romero? No  Are You Planning to Harm Someone at This Time? No  Explanation: NA   Have You Used Any Alcohol or Drugs in the Past 24 Hours? No  How Long Ago Did You Use Drugs or Alcohol? NA What Did You Use and How Much? NA  Do You Currently Have a Therapist/Psychiatrist? No  Name of Therapist/Psychiatrist: NA   Have You Been Recently Discharged From Any Office Practice or Programs? No  Explanation of Discharge From Practice/Program: NA    CCA Screening Triage Referral Assessment Type of Contact: Face-to-Face  Telemedicine Service Delivery: initial   Is this Initial or Reassessment? initial  Date Telepsych consult ordered in [facility]:  02/02/2024  Time Telepsych consult ordered in [facility]:  [id number]  Location of Assessment: [facility]  Provider Location: [facility] Assessment Services   Collateral Involvement: None at this time   Does Patient Have a Automotive engineer Guardian? No  Legal Guardian Contact Information: NA  Copy of Legal Guardianship Form: -- (NA)  Legal Guardian Notified of Arrival: -- (NA)  Legal Guardian Notified of Pending Discharge: -- (NA)  If Minor and Not Living with Parent(s), Who has Custody? NA  Is CPS involved or ever been involved? Never  Is APS involved or ever been involved? Never   Patient Determined To Be At Risk for Harm To Self or Others Based on Review of Patient Reported Information or Presenting Complaint? Yes, for Self-Harm  Method: No Plan  Availability of Means: No access or NA  Intent: Vague intent or NA  Notification Required: No need or identified person  Additional Information for Danger to Others Potential: -- (NA)  Additional Comments for Danger to Others Potential: NA  Are There Guns or Other Weapons in Your Home? No  Types of  Guns/Weapons: NA  Are These Weapons Safely Secured?                            -- (NA)  Who Could Verify You Are Able To Have These Secured: NA  Do You Have any Outstanding Charges, Pending Court Dates, Parole/Probation? Pt denies  Contacted To Inform of Risk of Harm To Self or Others: -- (NA)    Does Patient Present under Involuntary Commitment? No    [place] of Residence: [place]   Patient Currently Receiving the Following Services: Not Receiving Services   Determination of Need: Urgent (48 hours)   Options For Referral: Inpatient Hospitalization     CCA Biopsychosocial Patient Reported Schizophrenia/Schizoaffective Diagnosis in Past: Yes   Strengths: Pt is willing to participate in treatment   Mental Health Symptoms Depression:  Hopelessness; Worthlessness; Difficulty Concentrating   Duration of Depressive symptoms: Duration of Depressive Symptoms: Greater than two weeks   Mania:  None   Anxiety:   Worrying; Sleep   Psychosis:  Hallucinations   Duration of Psychotic symptoms: Duration of Psychotic Symptoms: Less than six months   Trauma:  None   Obsessions:  None   Compulsions:  None   Inattention:  Disorganized; Forgetful   Hyperactivity/Impulsivity:  Always on the go   Oppositional/Defiant Behaviors:  None   Emotional Irregularity:  None   Other Mood/Personality Symptoms:  N/A    Mental Status Exam Appearance and self-care  Stature:  Tall   Weight:  Average weight   Clothing:  Casual   Grooming:  Normal   Cosmetic use:  None   Posture/gait:  Normal   Motor activity:  Not Remarkable   Sensorium  Attention:  Normal; Distractible   Concentration:  Normal   Orientation:  Object; Person; X5; Situation; Place; Time   Recall/memory:  Normal   Affect and Mood  Affect:  Appropriate   Mood:  Depressed; Anxious   Relating  Eye contact:  Normal   Facial expression:  Depressed; Anxious   Attitude toward examiner:   Cooperative   Thought and Language  Speech flow: Normal   Thought content:  Appropriate to Mood and Circumstances   Preoccupation:  None   Hallucinations:  Auditory   Organization:  Patent examiner of Knowledge:  Average   Intelligence:  Average   Abstraction:  Normal   Judgement:  Fair   Dance movement psychotherapist:  Adequate; Distorted   Insight:  Lacking   Decision Making:  Vacilates   Social Functioning  Social Maturity:  Isolates   Social Judgement:  "Chief of Staff"   Stress  Stressors:  Family conflict; Work; Office manager Ability:  Exhausted; Overwhelmed   Skill Deficits:  Self-care   Supports:  Support needed     Religion: Religion/Spirituality Are You A Religious Person?: No How Might This Affect Treatment?: N/A  Leisure/Recreation: Leisure / Recreation Do You Have Hobbies?: No Leisure and Hobbies: NA  Exercise/Diet: Exercise/Diet Do You Exercise?: No Have You Gained or Lost A Significant Amount of Weight in the Past Six Months?: No Do You Follow a Special Diet?: No Do You Have Any Trouble Sleeping?: No   CCA Employment/Education Employment/Work Situation: Employment / Work Situation Employment Situation: Unemployed Patient's Job has Been Impacted by Current Illness: No Has Patient ever Been in Equities trader?: No  Education: Education Is Patient Currently Attending School?: No Last Grade Completed: 12 Did You Product manager?: Yes What Type of College Degree Do you Have?: Some college Did You Have An Individualized Education Program (IIEP): No Did You Have Any Difficulty At School?: No Patient's Education Has Been Impacted by Current Illness: No   CCA Family/Childhood History Family and Relationship History: Family history Marital status: Single Does patient have children?: No  Childhood History:  Childhood History By whom was/is the patient raised?: Both parents Description of patient's current relationship  with siblings: NA Did patient suffer any verbal/emotional/physical/sexual abuse as a child?: No Did patient suffer from severe childhood neglect?: No Has patient ever been sexually abused/assaulted/raped as an adolescent or adult?: No Type of abuse, by whom, and at what age: NA Was the patient ever a victim of a crime or a disaster?: No How has this affected patient's relationships?: NA Spoken with a professional about abuse?: No Does patient feel these issues are resolved?: No Witnessed domestic violence?: No Has patient been affected by domestic violence as an adult?: No Description of domestic violence: NA       CCA Substance Use Alcohol/Drug Use: Alcohol / Drug Use Pain Medications: See MAR Prescriptions: See MAR Over the Counter: See MAR History of alcohol / drug use?: Yes (Pt reports Hx of THC and alcohol  currently in sober living program through [facility].) Longest period of sobriety (when/how long): N/A Negative Consequences of Use:  (none reported) Withdrawal Symptoms:  (none reported) Substance #1 Name of Substance 1: Pt denies current use although per chart review patient has prior methamphetamine use 1 - Age of First Use: UTA 1 - Amount (size/oz): UTA 1 - Frequency: UTA 1 - Duration: UTA 1 - Last Use / Amount: Pt denies current use 1 - Method of Aquiring: UTA 1- Route of Use: UTA                       ASAM's:  Six Dimensions of Multidimensional Assessment  Dimension 1:  Acute Intoxication and/or Withdrawal Potential:   Dimension 1:  Description of individual's past and current experiences of substance use and withdrawal: none reported  Dimension 2:  Biomedical Conditions and Complications:   Dimension 2:  Description of patient's biomedical conditions and  complications: none reported  Dimension 3:  Emotional, Behavioral, or Cognitive Conditions and Complications:  Dimension 3:  Description of emotional, behavioral, or cognitive conditions and  complications: Hx of schizophrenia  Dimension 4:  Readiness to Change:  Dimension 4:  Description of Readiness to Change criteria: n/a  Dimension 5:  Relapse, Continued use, or Continued Problem Potential:  Dimension 5:  Relapse, continued use, or continued problem potential critiera description: n/a  Dimension 6:  Recovery/Living Environment:  Dimension 6:  Recovery/Iiving environment criteria description: n/a  ASAM Severity Score: ASAM's Severity Rating Score: 4  ASAM Recommended Level of Treatment: ASAM Recommended Level of Treatment:  (n/a)   Substance use Disorder (SUD) Substance Use Disorder (SUD)  Checklist Symptoms of Substance Use:  (Pt denies current use)  Recommendations for Services/Supports/Treatments: Recommendations for Services/Supports/Treatments Recommendations For Services/Supports/Treatments: Inpatient Hospitalization  Disposition Recommendation per psychiatric provider: We recommend inpatient psychiatric hospitalization when medically cleared. Patient is under voluntary admission status at this time; please IVC if attempts to leave hospital.   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   Schizoaffective disorder, depressive type (HCC) 10/09/2023   Schizoaffective disorder, bipolar type (HCC) 10/04/2023   Mild depression 03/30/2023   GAD (generalized anxiety disorder) 03/30/2023   Homelessness 02/26/2023   Tobacco use disorder 02/16/2023   Cannabis use disorder 02/16/2023   Schizophreniform psychosis (HCC) 02/15/2023   Schizophrenia (HCC) 11/01/2022   ADHD 03/28/2019     Referrals to Alternative Service(s): Referred to Alternative Service(s):   Place:   Date:   Time:    Referred to Alternative Service(s):   Place:   Date:   Time:    Referred to Alternative Service(s):   Place:   Date:   Time:    Referred  to Alternative Service(s):   Place:   Date:   Time:     Ansel Bass, LCAS

## 2024-02-02 NOTE — ED Notes (Signed)
 [facility] intake coordinator [phone]) called facility to determine if patient still needs placement.  Per social work notes pt has been faxed out for an inpatient bed.  Intake coordinator states she will review patient with provider to determine if they can accept at this time.

## 2024-02-02 NOTE — ED Notes (Addendum)
 Pt presenting to [facility] escorted by [employer]. Pt reported he has been off his meds today and that he is hearing voices but no commands. Pt Dx;Schizophrenia and has been endorsing voices for some time. Pt reports that he took 5 sleeping pills last night because he could not sleep. PT endorses suicidal thoughts today but no intent or plan. UDS + for THC. Affect worried. Mood anxious. Hydoxyzine 25mg  po given for anxiety and tolerated. Cooperative with admission process and skin check. Oriented to unit and no concerns. Meal offered and accepted. Currently pt is resting in recliner, appears relaxed. Behavior is controlled. Safety maintained.

## 2024-02-02 NOTE — Progress Notes (Addendum)
 [name] Progress Note:    William Romero MRN: [id number]  02/02/2024 5:53 pm  William Romero is a 43 year old male who [facility], where he was evaluated and recommended for inpatient treatment by [facility] provider, Addie Holstein, NP. The patient was initially referred to [facility] for admission; however, after review by the [facility], it was determined that there was no appropriate bed available.  Clinician was advised to fax the referral to alternative hospitals for consideration of bed placement. The patient was referred to the following hospitals.   Destination  Service Provider Request Status Services Address Phone Fax Patient Preferred  [facility] Patient Placement Pending - Request Sent -- [facility] [place], [place] [phone] --  [facility] Pending - Request Sent -- [address] [place] [address] [phone] --  [facility] Pending - Request Sent -- [address] [place], [place] [address] [phone] --  [facility] Pending - Request Sent -- [address] [place] [address] [phone] --  [facility] Pending - Request Sent -- [id number] [place] [address] [phone] --  [facility] Pending - Request Sent -- [address] [place], [place] [address] [phone] --  [facility] Pending - Request Sent -- [address] [place] [address] [phone] --  [facility] Pending - Request Sent -- [address] [place] [address] [phone] --  [facility] Pending - Request Sent -- [address] [place] [address] [phone] --  [facility] Pending - Request Sent -- [address] [place] [address] [phone] --  [facility] Pending - Request Sent -- [place] [phone] --  [facility] Pending - Request Sent -- [address] [place] [address] [phone] --  [facility] Pending - Request Sent -- [address] [place] [address] [phone] --  [facility] Pending - Request Sent -- [address], [place] [address] [phone] --  [facility] Pending - Request Sent -- [address], [place] [address] [phone] --  [facility] EFAX Pending - Request Sent -- [place] [phone] --  [facility] Pending - Request Sent -- [address] [place], [place] [address] [phone] --  [facility] [place] Pending - [address] [place] [address] [phone] --  [facility] Pending - Request Sent -- [address] [place] [address] [phone] --  [facility] Pending - Request Sent -- [address] [place] [address] [phone] --  [facility] Pending - Request Sent -- [address] [place], [place] [phone] --  [facility] Pending - Request Sent -- [address]os [address], [place] [address] [phone] --  [facility] Pending - Request Sent -- [address] [place] [address] [phone] --  [facility] Pending - Request Sent -- [address], [place] [address] [phone] --  [facility] Pending - Request Sent -- [address] [place] [address] [phone] --  [facility] Pending - Request  Sent -- [address] [place] [address] [phone] --  [facility] Pending -  Request Sent -- [address] [place] [address] [phone]

## 2024-02-02 NOTE — ED Notes (Signed)
Pt resting quietly with eyes closed.  No pain or discomfort noted/voiced.  Breathing is even and unlabored.  Will continue to monitor for safety.  

## 2024-02-02 NOTE — ED Provider Notes (Signed)
 [facility] Continuous Assessment Admission H&P  Date: 02/02/24 Patient Name: William Romero MRN: [id number] Chief Complaint: "Can't calm down"  Diagnoses:  Final diagnoses:  Schizophrenia, paranoid [facility])    HPI: Per triage, William Romero is a 43 year old male presenting to [facility] escorted by [employer] vol. Pt mentions he has been off his meds today and that he is hearing voices. PT is diagnosed with Schizophrenia and has been endorsing voices for some time. Pt reports that he took 5 sleeping pills last night because he could not sleep. PT endorses suicidal thoughts today, but no plan. Pt denies substance use, Hi and Vh.   Chart reviewed with attending psychiatrist, Dr. Kathlen Para.  Patient is seen face-to-face at [facility]. He is ambulating around the room.  He is visibly anxious.  He is alert and oriented x 4.  States that he was brought to this facility by police.  States he called them to ask for help.He states "can't calm down and can't sleep."  He states he has not had any sleep in the past 72 hours. States last night he took five acetaminophen  PM to try to sleep.  States the acetaminophen  PM was ineffective, it did not cause him to sleep or even doze. He reports mental health diagnosis of schizophrenia. He is supposed to be on mental health meds. He is unable to recall the name of the medication and the last time he took the medication.   He reports being hospitalized at [facility] behavioral [facility] for 1 week last week. States medications prescribed there "did not help."  Discharge summary from this admission reviewed.  Per chart patient reported failing multiple antipsychotic trials including Risperdal , Haldol .  During most recent inpatient hospitalization patient was switched to Prolixin  which was titrated to 5 mg 3 times daily but had to be lowered to 5 mg twice daily due to orthostatic hypotension.  Clozapine was initiated and titrated to 100 mg with good effect with patient reporting  improvement in auditory hallucinations and paranoia.  During most recent inpatient hospitalization patient agreed to long-acting injectable of Prolixin  decanoate 12.5 mg.  He was discharged with samples as well as 30-day scripts for medications and for Prolixin  with the next dose of 12.5 mg IM due to on February 19, 2024.  Today, patient states he does not recall receiving an injection during hospitalization.  He also states that he lost samples that were provided to him and again is unclear of the last time that he had any mental health medications.  He endorses suicidal ideation without plan or intent. He denies homicidal ideation, intent or plan.  Denies visual hallucinations.  He endorses auditory hallucinations; denies these are command in nature.  He denies tobacco, alcohol, or illicit substance use.  Patient to be admitted for exacerbation of schizophrenia as evidenced by limited to fair recall, auditory hallucinations, paranoia and limited insight.   Total Time spent with patient: 15 minutes  Musculoskeletal  Strength & Muscle Tone: within normal limits Gait & Station: normal Patient leans: N/A  Psychiatric Specialty Exam  Presentation General Appearance:  Appropriate for Environment; Fairly Groomed  Eye Contact: Good  Speech: Clear and Coherent  Speech Volume: Normal  Handedness: Right   Mood and Affect  Mood: Anxious  Affect: Congruent   Thought Process  Thought Processes: Coherent  Descriptions of Associations:Intact  Orientation:Full (Time, Place and Person)  Thought Content:Logical  Diagnosis of Schizophrenia or Schizoaffective disorder in past: Yes  Duration of Psychotic Symptoms: Greater  than six months  Hallucinations:Hallucinations: Auditory  Ideas of Reference:None  Suicidal Thoughts:No data recorded Homicidal Thoughts:Homicidal Thoughts: No   Sensorium  Memory: Immediate Fair; Recent Fair; Remote  Fair  Judgment: Fair  Insight: Limited  Executive Functions  Concentration: Fair  Attention Span: Fair  Recall: Fair  Fund of Knowledge: Fair  Language: Good   Psychomotor Activity  Psychomotor Activity:Psychomotor Activity: Increased   Assets  Assets: Communication Skills; Desire for Improvement; Physical Health   Sleep  Sleep:Sleep: Poor Number of Hours of Sleep: 0   Nutritional Assessment (For OBS and FBC admissions only) Has the patient had a weight loss or gain of 10 pounds or more in the last 3 months?: No Has the patient had a decrease in food intake/or appetite?: No Does the patient have eating habits or behaviors that may be indicators of an eating disorder including binging or inducing vomiting?: No Has the patient recently lost weight without trying?: 0 Has the patient been eating poorly because of a decreased appetite?: 0 Malnutrition Screening Tool Score: 0    Physical Exam Vitals and nursing note reviewed.  HENT:     Head: Normocephalic.     Nose: Nose normal.     Mouth/Throat:     Mouth: Mucous membranes are moist.  Cardiovascular:     Rate and Rhythm: Normal rate.  Pulmonary:     Effort: Pulmonary effort is normal.  Musculoskeletal:        General: Normal range of motion.  Skin:    General: Skin is warm and dry.  Neurological:     Mental Status: He is alert and oriented to person, place, and time.   Review of Systems  Constitutional:  Negative for fever.  HENT:  Negative for congestion.   Respiratory:  Negative for shortness of breath.   Cardiovascular:  Negative for chest pain and palpitations.  Psychiatric/Behavioral:  Positive for hallucinations and suicidal ideas. The patient is nervous/anxious.     Blood pressure 124/89, pulse 88, temperature 98.7 F (37.1 C), temperature source Oral, resp. rate 19, SpO2 99%. There is no height or weight on file to calculate BMI.  Past Psychiatric History:  Current psychiatrist:  Previously seen by Dr. Augusta Blizzard 04/13/2023, poor followup Current therapist: none. Previous reported psychiatric diagnoses:  ?ADHD, schizoaffective disorder bipolar type, GAD Current psychiatric medications: none. Psychiatric medication history/compliance: Per chart review, poor with the exception of Adderall.  Full med trials include: adderall, bupropion, duloxetine , haloperidol , hydroxyzine  (ineffective), lorazepam , olanzapine , ziprasidone , paliperidone , risperidone ,  Psychiatric hospitalization(s): Approximately 6 stays over the past from 2012-2019.  Most recently, patient had 3 separate stays at [facility] in 2024, 6/11 - 6/19 and 6/22 - 7/1, and on 10/2023 Psychotherapy history: None Neuromodulation history: none. History of suicide (obtained from HPI): Reported 1 attempted OD in distant past, presently denied History of homicide or aggression (obtained in HPI): none.  Is the patient at risk to self? Yes  Has the patient been a risk to self in the past 6 months? Yes .    Has the patient been a risk to self within the distant past? Yes   Is the patient a risk to others? No   Has the patient been a risk to others in the past 6 months? No   Has the patient been a risk to others within the distant past? No   Past Medical History: h/o appendectomy  Family History: No pertinent family history  Social History: states living in his car.  Last Labs:  Admission on 01/23/2024, Discharged on 01/29/2024  Component Date Value Ref Range Status   WBC 01/24/2024 5.2  4.0 - 10.5 K/uL Final   RBC 01/24/2024 4.62  4.22 - 5.81 MIL/uL Final   Hemoglobin 01/24/2024 13.7  13.0 - 17.0 g/dL Final   HCT 66/44/0347 42.9  39.0 - 52.0 % Final   MCV 01/24/2024 92.9  80.0 - 100.0 fL Final   MCH 01/24/2024 29.7  26.0 - 34.0 pg Final   MCHC 01/24/2024 31.9  30.0 - 36.0 g/dL Final   RDW 42/59/5638 13.6  11.5 - 15.5 % Final   Platelets 01/24/2024 254  150 - 400 K/uL Final   nRBC 01/24/2024 0.0  0.0 - 0.2 % Final    Neutrophils Relative % 01/24/2024 58  % Final   Neutro Abs 01/24/2024 3.1  1.7 - 7.7 K/uL Final   Lymphocytes Relative 01/24/2024 26  % Final   Lymphs Abs 01/24/2024 1.3  0.7 - 4.0 K/uL Final   Monocytes Relative 01/24/2024 9  % Final   Monocytes Absolute 01/24/2024 0.4  0.1 - 1.0 K/uL Final   Eosinophils Relative 01/24/2024 4  % Final   Eosinophils Absolute 01/24/2024 0.2  0.0 - 0.5 K/uL Final   Basophils Relative 01/24/2024 2  % Final   Basophils Absolute 01/24/2024 0.1  0.0 - 0.1 K/uL Final   Immature Granulocytes 01/24/2024 1  % Final   Abs Immature Granulocytes 01/24/2024 0.03  0.00 - 0.07 K/uL Final   Performed at [facility], [address] [place], [place] [address]   Troponin I (High Sensitivity) 01/25/2024 5  <18 ng/L Final   Comment: (NOTE) Elevated high sensitivity troponin I (hsTnI) values and significant  changes across serial measurements may suggest ACS but many other  chronic and acute conditions are known to elevate hsTnI results.  Refer to the "Links" section for chest pain algorithms and additional  guidance. Performed at [facility], [address], [place], [place] [address]    Troponin I (High Sensitivity) 01/25/2024 5  <18 ng/L Final   Comment: (NOTE) Elevated high sensitivity troponin I (hsTnI) values and significant  changes across serial measurements may suggest ACS but many other  chronic and acute conditions are known to elevate hsTnI results.  Refer to the "Links" section for chest pain algorithms and additional  guidance. Performed at [facility], [address] [place], [place] [address]    WBC 01/29/2024 4.7  4.0 - 10.5 K/uL Final   RBC 01/29/2024 4.91  4.22 - 5.81 MIL/uL Final   Hemoglobin 01/29/2024 14.4  13.0 - 17.0 g/dL Final   HCT 60/63/0160 44.8  39.0 - 52.0 % Final   MCV 01/29/2024 91.2  80.0 - 100.0 fL Final   MCH 01/29/2024 29.3  26.0 - 34.0 pg Final   MCHC 01/29/2024 32.1  30.0 - 36.0  g/dL Final   RDW 10/93/2355 13.6  11.5 - 15.5 % Final   Platelets 01/29/2024 241  150 - 400 K/uL Final   nRBC 01/29/2024 0.0  0.0 - 0.2 % Final   Neutrophils Relative % 01/29/2024 51  % Final   Neutro Abs 01/29/2024 2.4  1.7 - 7.7 K/uL Final   Lymphocytes Relative 01/29/2024 30  % Final   Lymphs Abs 01/29/2024 1.4  0.7 - 4.0 K/uL Final   Monocytes Relative 01/29/2024 9  % Final   Monocytes Absolute 01/29/2024 0.4  0.1 - 1.0 K/uL Final   Eosinophils Relative 01/29/2024 8  %  Final   Eosinophils Absolute 01/29/2024 0.4  0.0 - 0.5 K/uL Final   Basophils Relative 01/29/2024 1  % Final   Basophils Absolute 01/29/2024 0.0  0.0 - 0.1 K/uL Final   Immature Granulocytes 01/29/2024 1  % Final   Abs Immature Granulocytes 01/29/2024 0.04  0.00 - 0.07 K/uL Final   Performed at [facility], [address], [place], [place] [address]   Sodium 01/29/2024 139  135 - 145 mmol/L Final   Potassium 01/29/2024 4.7  3.5 - 5.1 mmol/L Final   Chloride 01/29/2024 101  98 - 111 mmol/L Final   CO2 01/29/2024 29  22 - 32 mmol/L Final   Glucose, Bld 01/29/2024 110 (H)  70 - 99 mg/dL Final   Glucose reference range applies only to samples taken after fasting for at least 8 hours.   BUN 01/29/2024 12  6 - 20 mg/dL Final   Creatinine, Ser 01/29/2024 1.10  0.61 - 1.24 mg/dL Final   Calcium 98/07/9146 8.7 (L)  8.9 - 10.3 mg/dL Final   Total Protein 82/95/6213 6.9  6.5 - 8.1 g/dL Final   Albumin 08/65/7846 3.6  3.5 - 5.0 g/dL Final   AST 96/29/5284 18  15 - 41 U/L Final   ALT 01/29/2024 16  0 - 44 U/L Final   Alkaline Phosphatase 01/29/2024 66  38 - 126 U/L Final   Total Bilirubin 01/29/2024 0.3  0.0 - 1.2 mg/dL Final   GFR, Estimated 01/29/2024 >60  >60 mL/min Final   Comment: (NOTE) Calculated using the CKD-EPI Creatinine Equation (2021)    Anion gap 01/29/2024 9  5 - 15 Final   Performed at [facility], [address] [place], [place] [address]  Admission on 01/13/2024,  Discharged on 01/23/2024  Component Date Value Ref Range Status   TSH 01/15/2024 1.559  0.350 - 4.500 uIU/mL Final   Comment: Performed by a 3rd Generation assay with a functional sensitivity of <=0.01 uIU/mL. Performed at [facility], [address], [place], [place] [address]    Hgb A1c MFr Bld 01/15/2024 5.2  4.8 - 5.6 % Final   Comment: (NOTE)         Prediabetes: 5.7 - 6.4         Diabetes: >6.4         Glycemic control for adults with diabetes: <7.0    Mean Plasma Glucose 01/15/2024 103  mg/dL Final   Comment: (NOTE) Performed At: [facility] [address] [place], [place] [id number] Pearlean Botts MD [id number]    Cholesterol 01/15/2024 168  0 - 200 mg/dL Final   Triglycerides 64/33/2951 172 (H)  <150 mg/dL Final   HDL 88/41/6606 42  >40 mg/dL Final   Total CHOL/HDL Ratio 01/15/2024 4.0  RATIO Final   VLDL 01/15/2024 34  0 - 40 mg/dL Final   LDL Cholesterol 01/15/2024 92  0 - 99 mg/dL Final   Comment:        Total Cholesterol/HDL:CHD Risk Coronary Heart Disease Risk Table                     Men   Women  1/2 Average Risk   3.4   3.3  Average Risk       5.0   4.4  2 X Average Risk   9.6   7.1  3 X Average Risk  23.4   11.0        Use the calculated Patient Ratio  above and the CHD Risk Table to determine the patient's CHD Risk.        ATP III CLASSIFICATION (LDL):  <100     mg/dL   Optimal  440-347  mg/dL   Near or Above                    Optimal  130-159  mg/dL   Borderline  425-956  mg/dL   High  >387     mg/dL   Very High Performed at [facility], [address] [place], [place] [address]    Valproic Acid  Lvl 01/18/2024 47 (L)  50 - 100 ug/mL Final   Performed at [facility], [address], [place], [place] [address]   Valproic Acid  Lvl 01/23/2024 83  50 - 100 ug/mL Final   Performed at [facility], [address], [place], [place] [address]   Total Protein  01/23/2024 6.9  6.5 - 8.1 g/dL Final   Albumin 03/04/1600 3.6  3.5 - 5.0 g/dL Final   AST 09/32/3557 21  15 - 41 U/L Final   ALT 01/23/2024 19  0 - 44 U/L Final   Alkaline Phosphatase 01/23/2024 64  38 - 126 U/L Final   Total Bilirubin 01/23/2024 0.5  0.0 - 1.2 mg/dL Final   Bilirubin, Direct 01/23/2024 <0.1  0.0 - 0.2 mg/dL Final   REPEATED TO VERIFY   Indirect Bilirubin 01/23/2024 NOT CALCULATED  0.3 - 0.9 mg/dL Final   Performed at [facility], [address], [place], [place] [address]  Admission on 01/11/2024, Discharged on 01/13/2024  Component Date Value Ref Range Status   Sodium 01/11/2024 140  135 - 145 mmol/L Final   Potassium 01/11/2024 4.1  3.5 - 5.1 mmol/L Final   Chloride 01/11/2024 110  98 - 111 mmol/L Final   CO2 01/11/2024 23  22 - 32 mmol/L Final   Glucose, Bld 01/11/2024 102 (H)  70 - 99 mg/dL Final   Glucose reference range applies only to samples taken after fasting for at least 8 hours.   BUN 01/11/2024 10  6 - 20 mg/dL Final   Creatinine, Ser 01/11/2024 0.82  0.61 - 1.24 mg/dL Final   Calcium 54/27/0623 8.5 (L)  8.9 - 10.3 mg/dL Final   Total Protein 76/28/3151 6.3 (L)  6.5 - 8.1 g/dL Final   Albumin 76/16/0737 3.5  3.5 - 5.0 g/dL Final   AST 10/62/6948 21  15 - 41 U/L Final   ALT 01/11/2024 17  0 - 44 U/L Final   Alkaline Phosphatase 01/11/2024 59  38 - 126 U/L Final   Total Bilirubin 01/11/2024 0.5  0.0 - 1.2 mg/dL Final   GFR, Estimated 01/11/2024 >60  >60 mL/min Final   Comment: (NOTE) Calculated using the CKD-EPI Creatinine Equation (2021)    Anion gap 01/11/2024 7  5 - 15 Final   Performed at [facility], [address], [place], [place] [address]   Alcohol, Ethyl (B) 01/11/2024 <15  <15 mg/dL Final   Comment: Please note change in reference range. (NOTE) For medical purposes only. Performed at [facility], [address] [place], [place] [address]    WBC 01/11/2024 8.2  4.0 - 10.5 K/uL  Final   RBC 01/11/2024 4.62  4.22 - 5.81 MIL/uL Final   Hemoglobin 01/11/2024 13.6  13.0 - 17.0 g/dL Final   HCT 93/81/8299 43.6  39.0 - 52.0 % Final  MCV 01/11/2024 94.4  80.0 - 100.0 fL Final   MCH 01/11/2024 29.4  26.0 - 34.0 pg Final   MCHC 01/11/2024 31.2  30.0 - 36.0 g/dL Final   RDW 09/81/1914 14.0  11.5 - 15.5 % Final   Platelets 01/11/2024 330  150 - 400 K/uL Final   nRBC 01/11/2024 0.0  0.0 - 0.2 % Final   Performed at [facility], [address] [place], [place] [address]   Opiates 01/11/2024 NONE DETECTED  NONE DETECTED Final   Cocaine 01/11/2024 NONE DETECTED  NONE DETECTED Final   Benzodiazepines 01/11/2024 NONE DETECTED  NONE DETECTED Final   Amphetamines 01/11/2024 POSITIVE (A)  NONE DETECTED Final   Comment: (NOTE) Trazodone  is metabolized in vivo to several metabolites, including pharmacologically active m-CPP, which is excreted in the urine. Immunoassay screens for amphetamines and MDMA have potential cross-reactivity with these compounds and may provide false positive  results.     Tetrahydrocannabinol 01/11/2024 POSITIVE (A)  NONE DETECTED Final   Barbiturates 01/11/2024 NONE DETECTED  NONE DETECTED Final   Comment: (NOTE) DRUG SCREEN FOR MEDICAL PURPOSES ONLY.  IF CONFIRMATION IS NEEDED FOR ANY PURPOSE, NOTIFY LAB WITHIN 5 DAYS.  LOWEST DETECTABLE LIMITS FOR URINE DRUG SCREEN Drug Class                     Cutoff (ng/mL) Amphetamine  and metabolites    1000 Barbiturate and metabolites    200 Benzodiazepine                 200 Opiates and metabolites        300 Cocaine and metabolites        300 THC                            50 Performed at [facility], [address], [place], [place] [address]   Admission on 10/03/2023, Discharged on 10/12/2023  Component Date Value Ref Range Status   Sodium 10/05/2023 139  135 - 145 mmol/L Final   Potassium 10/05/2023 4.2  3.5 - 5.1 mmol/L Final   Chloride 10/05/2023 103   98 - 111 mmol/L Final   CO2 10/05/2023 27  22 - 32 mmol/L Final   Glucose, Bld 10/05/2023 82  70 - 99 mg/dL Final   Glucose reference range applies only to samples taken after fasting for at least 8 hours.   BUN 10/05/2023 11  6 - 20 mg/dL Final   Creatinine, Ser 10/05/2023 0.99  0.61 - 1.24 mg/dL Final   Calcium 86/57/8469 8.7 (L)  8.9 - 10.3 mg/dL Final   GFR, Estimated 10/05/2023 >60  >60 mL/min Final   Comment: (NOTE) Calculated using the CKD-EPI Creatinine Equation (2021)    Anion gap 10/05/2023 9  5 - 15 Final   Performed at [facility], [address] [place], [place] [address]  Admission on 10/03/2023, Discharged on 10/03/2023  Component Date Value Ref Range Status   WBC 10/03/2023 5.8  4.0 - 10.5 K/uL Final   RBC 10/03/2023 4.81  4.22 - 5.81 MIL/uL Final   Hemoglobin 10/03/2023 14.4  13.0 - 17.0 g/dL Final   HCT 84/13/2440 41.7  39.0 - 52.0 % Final   MCV 10/03/2023 86.7  80.0 - 100.0 fL Final   MCH 10/03/2023 29.9  26.0 - 34.0 pg Final   MCHC 10/03/2023 34.5  30.0 - 36.0 g/dL Final   RDW 07/02/2535  12.9  11.5 - 15.5 % Final   Platelets 10/03/2023 361  150 - 400 K/uL Final   nRBC 10/03/2023 0.0  0.0 - 0.2 % Final   Neutrophils Relative % 10/03/2023 70  % Final   Neutro Abs 10/03/2023 4.0  1.7 - 7.7 K/uL Final   Lymphocytes Relative 10/03/2023 17  % Final   Lymphs Abs 10/03/2023 1.0  0.7 - 4.0 K/uL Final   Monocytes Relative 10/03/2023 11  % Final   Monocytes Absolute 10/03/2023 0.6  0.1 - 1.0 K/uL Final   Eosinophils Relative 10/03/2023 1  % Final   Eosinophils Absolute 10/03/2023 0.1  0.0 - 0.5 K/uL Final   Basophils Relative 10/03/2023 1  % Final   Basophils Absolute 10/03/2023 0.1  0.0 - 0.1 K/uL Final   Immature Granulocytes 10/03/2023 0  % Final   Abs Immature Granulocytes 10/03/2023 0.01  0.00 - 0.07 K/uL Final   Performed at [facility], [address] [place], [place] [address]   Sodium 10/03/2023 139  135 - 145 mmol/L Final    Potassium 10/03/2023 3.3 (L)  3.5 - 5.1 mmol/L Final   Chloride 10/03/2023 105  98 - 111 mmol/L Final   CO2 10/03/2023 27  22 - 32 mmol/L Final   Glucose, Bld 10/03/2023 94  70 - 99 mg/dL Final   Glucose reference range applies only to samples taken after fasting for at least 8 hours.   BUN 10/03/2023 8  6 - 20 mg/dL Final   Creatinine, Ser 10/03/2023 1.14  0.61 - 1.24 mg/dL Final   Calcium 62/13/0865 9.0  8.9 - 10.3 mg/dL Final   Total Protein 78/46/9629 7.6  6.5 - 8.1 g/dL Final   Albumin 52/84/1324 3.6  3.5 - 5.0 g/dL Final   AST 40/06/2724 21  15 - 41 U/L Final   ALT 10/03/2023 20  0 - 44 U/L Final   Alkaline Phosphatase 10/03/2023 83  38 - 126 U/L Final   Total Bilirubin 10/03/2023 0.6  0.0 - 1.2 mg/dL Final   GFR, Estimated 10/03/2023 >60  >60 mL/min Final   Comment: (NOTE) Calculated using the CKD-EPI Creatinine Equation (2021)    Anion gap 10/03/2023 7  5 - 15 Final   Performed at [facility], [address] [place], [place] [address]   Hgb A1c MFr Bld 10/03/2023 5.1  4.8 - 5.6 % Final   Comment: (NOTE) Pre diabetes:          5.7%-6.4%  Diabetes:              >6.4%  Glycemic control for   <7.0% adults with diabetes    Mean Plasma Glucose 10/03/2023 99.67  mg/dL Final   Performed at [facility], [address] [place], [place] [address]   Magnesium  10/03/2023 2.1  1.7 - 2.4 mg/dL Final   Performed at [facility], [address], [place], [place] [address]   Alcohol, Ethyl (B) 10/03/2023 <10  <10 mg/dL Final   Comment: (NOTE) Lowest detectable limit for serum alcohol is 10 mg/dL.  For medical purposes only. Performed at [facility], [address] [place], [place] [address]    Cholesterol 10/03/2023 157  0 - 200 mg/dL Final   Triglycerides 43/32/9518 76  <150 mg/dL Final   HDL 84/16/6063 44  >40 mg/dL Final   Total CHOL/HDL Ratio 10/03/2023 3.6  RATIO Final   VLDL 10/03/2023 15  0 - 40 mg/dL Final  LDL Cholesterol 10/03/2023 98  0 -  99 mg/dL Final   Comment:        Total Cholesterol/HDL:CHD Risk Coronary Heart Disease Risk Table                     Men   Women  1/2 Average Risk   3.4   3.3  Average Risk       5.0   4.4  2 X Average Risk   9.6   7.1  3 X Average Risk  23.4   11.0        Use the calculated Patient Ratio above and the CHD Risk Table to determine the patient's CHD Risk.        ATP III CLASSIFICATION (LDL):  <100     mg/dL   Optimal  161-096  mg/dL   Near or Above                    Optimal  130-159  mg/dL   Borderline  045-409  mg/dL   High  >811     mg/dL   Very High Performed at [facility], [address] [place], [place] [address]    TSH 10/03/2023 0.641  0.350 - 4.500 uIU/mL Final   Comment: Performed by a 3rd Generation assay with a functional sensitivity of <=0.01 uIU/mL. Performed at [facility], [address] [place], [place] [address]    POC Amphetamine  UR 10/03/2023 Positive (A)  NONE DETECTED (Cut Off Level 1000 ng/mL) Final   POC Secobarbital (BAR) 10/03/2023 None Detected  NONE DETECTED (Cut Off Level 300 ng/mL) Final   POC Buprenorphine (BUP) 10/03/2023 None Detected  NONE DETECTED (Cut Off Level 10 ng/mL) Final   POC Oxazepam (BZO) 10/03/2023 None Detected  NONE DETECTED (Cut Off Level 300 ng/mL) Final   POC Cocaine UR 10/03/2023 None Detected  NONE DETECTED (Cut Off Level 300 ng/mL) Final   POC Methamphetamine UR 10/03/2023 None Detected  NONE DETECTED (Cut Off Level 1000 ng/mL) Final   POC Morphine 10/03/2023 None Detected  NONE DETECTED (Cut Off Level 300 ng/mL) Final   POC Methadone UR 10/03/2023 None Detected  NONE DETECTED (Cut Off Level 300 ng/mL) Final   POC Oxycodone UR 10/03/2023 None Detected  NONE DETECTED (Cut Off Level 100 ng/mL) Final   POC Marijuana UR 10/03/2023 Positive (A)  NONE DETECTED (Cut Off Level 50 ng/mL) Final   Color, Urine 10/03/2023 YELLOW  YELLOW Final   APPearance 10/03/2023 CLEAR  CLEAR Final   Specific Gravity, Urine 10/03/2023  1.025  1.005 - 1.030 Final   pH 10/03/2023 5.0  5.0 - 8.0 Final   Glucose, UA 10/03/2023 NEGATIVE  NEGATIVE mg/dL Final   Hgb urine dipstick 10/03/2023 NEGATIVE  NEGATIVE Final   Bilirubin Urine 10/03/2023 NEGATIVE  NEGATIVE Final   Ketones, ur 10/03/2023 NEGATIVE  NEGATIVE mg/dL Final   Protein, ur 13/04/6577 NEGATIVE  NEGATIVE mg/dL Final   Nitrite 46/96/2952 NEGATIVE  NEGATIVE Final   Leukocytes,Ua 10/03/2023 NEGATIVE  NEGATIVE Final   Performed at [facility] [facility], [address], [place], [place] [address]    Allergies: Patient has no known allergies.  Medications:  Facility Ordered Medications  Medication   acetaminophen  (TYLENOL ) tablet 650 mg   alum & mag hydroxide-simeth (MAALOX/MYLANTA) 200-200-20 MG/5ML suspension 30 mL   magnesium  hydroxide (MILK OF MAGNESIA) suspension 30 mL   haloperidol  (HALDOL ) tablet 5 mg   And   diphenhydrAMINE  (BENADRYL )  capsule 50 mg   haloperidol  lactate (HALDOL ) injection 5 mg   And   diphenhydrAMINE  (BENADRYL ) injection 50 mg   And   LORazepam  (ATIVAN ) injection 2 mg   haloperidol  lactate (HALDOL ) injection 10 mg   And   diphenhydrAMINE  (BENADRYL ) injection 50 mg   And   LORazepam  (ATIVAN ) injection 2 mg   hydrOXYzine  (ATARAX ) tablet 25 mg   PTA Medications  Medication Sig   fluPHENAZine  (PROLIXIN ) 5 MG tablet Take 0.5 tablets (2.5 mg total) by mouth 2 (two) times daily.   fluPHENAZine  decanoate (PROLIXIN ) 25 MG/ML injection Inject 0.5 mLs (12.5 mg total) into the muscle every 21 ( twenty-one) days.   hydrOXYzine  (ATARAX ) 50 MG tablet Take 1 tablet (50 mg total) by mouth 3 (three) times daily as needed for anxiety.   mirtazapine  (REMERON  SOL-TAB) 15 MG disintegrating tablet Take 1 tablet (15 mg total) by mouth at bedtime.   nicotine  (NICODERM CQ  - DOSED IN MG/24 HOURS) 21 mg/24hr patch Place 1 patch (21 mg total) onto the skin daily at 6 (six) AM.   traZODone  (DESYREL ) 100 MG tablet Take 2 tablets (200 mg total) by mouth at bedtime  as needed for sleep.   polyethylene glycol (MIRALAX / GLYCOLAX) 17 g packet Take 17 g by mouth daily.   senna-docusate (SENOKOT-S) 8.6-50 MG tablet Take 1 tablet by mouth 2 (two) times daily.   benztropine  (COGENTIN ) 1 MG tablet Take 1 tablet (1 mg total) by mouth 2 (two) times daily as needed (for muscle spasm or dystonia only).   divalproex  (DEPAKOTE ) 250 MG DR tablet Take 3 tablets (750 mg total) by mouth 2 (two) times daily.   cloZAPine (CLOZARIL) 100 MG tablet Take 1 tablet (100 mg total) by mouth at bedtime.      Medical Decision Making  Lab Orders         CBC with Differential/Platelet         Comprehensive metabolic panel         Ethanol         Clozapine (clozaril)         Acetaminophen  level         POCT Urine Drug Screen - (I-Screen)     EKG    Recommendations  Based on my evaluation the patient does not appear to have an emergency medical condition.  Start Prolixin  2.5 mg PO BID  Addie Holstein, PMHNP-BC, FNP-BC  02/02/24  12:06 PM

## 2024-02-02 NOTE — Progress Notes (Signed)
   02/02/24 1052  [facility] Triage Screening (Walk-ins at [facility] only)  How Did You Hear About Us ? Legal System  What Is the Reason for Your Visit/Call Today? William Romero is a 43 year old male presenting to [facility] escorted by [employer] vol. Pt mentions he has been off his meds today and that he is hearing voices. PT is diagnosed with Schizophrenia and has been endorsing voices for some time. Pt reports that he took 5 sleeping pills last night because he could not sleep. PT endorses suicidal thoughts today, but no plan. Pt denies substance use, Hi and Vh.  How Long Has This Been Causing You Problems? <Week  Have You Recently Had Any Thoughts About Hurting Yourself? Yes  How long ago did you have thoughts about hurting yourself? today  Are You Planning to Commit Suicide/Harm Yourself At This time? No  Have you Recently Had Thoughts About Hurting Someone [name]? No  Are You Planning To Harm Someone At This Time? No  Physical Abuse Denies  Verbal Abuse Denies  Sexual Abuse Denies  Exploitation of patient/patient's resources Denies  Self-Neglect Denies  Possible abuse reported to: Other (Comment)  Are you currently experiencing any auditory, visual or other hallucinations? Yes  Please explain the hallucinations you are currently experiencing: AH- unable to specify what he is hearing  Have You Used Any Alcohol or Drugs in the Past 24 Hours? No  Do you have any current medical co-morbidities that require immediate attention? No  Clinician description of patient physical appearance/behavior: fidgety, anxious, pacing  What Do You Feel Would Help You the Most Today? Medication(s)  If access to [facility] was not available, would you have sought care in the Emergency Department? No  Determination of Need Urgent (48 hours)  Options For Referral Inpatient Hospitalization;Medication Management  Determination of Need filed? Yes

## 2024-02-03 LAB — POC SARS CORONAVIRUS 2 AG: SARSCOV2ONAVIRUS 2 AG: NEGATIVE

## 2024-02-03 MED ORDER — HYDROXYZINE HCL 25 MG PO TABS: 50.0000 mg | ORAL_TABLET | Freq: Once | ORAL | Status: DC

## 2024-02-03 NOTE — ED Notes (Signed)
 Patient in milieu eating breakfast. Calm, collected, no physical complaints at this time. Patient in no apparent acute distress. Environment secured. Safety checks in place per facility protocol.

## 2024-02-03 NOTE — ED Notes (Signed)
 Patient alert & oriented x4. Denies intent to harm self or others when asked. Endorses A/VH (auditory as voices that he can't make out and visual of faces). Patient denies CAH. Patient denies any pain when asked but reports a generalized discomfort regarding being here. No acute distress noted. Support and encouragement provided. Scheduled medications administered with no complications. Routine safety checks conducted per facility protocol. Encouraged patient to notify staff if any thoughts of harm towards self or others arise. Patient verbalizes understanding and agreement.

## 2024-02-03 NOTE — ED Provider Notes (Signed)
 FBC/OBS ASAP Discharge Summary  Date and Time: 02/03/2024 9:52 AM  Name: William Romero  MRN:  [id number]   Discharge Diagnoses:  Final diagnoses:  Schizophrenia, paranoid (HCC)    Subjective: "Feeling scared."   Stay Summary: Per triage, William Romero is a 43 year old male presenting to [facility] escorted by [employer] vol. Pt mentions he has been off his meds today and that he is hearing voices. PT is diagnosed with Schizophrenia and has been endorsing voices for some time. Pt reports that he took 5 sleeping pills last night because he could not sleep. PT endorses suicidal thoughts today, but no plan. Pt denies substance use, Hi and Vh.   Patient stated that he called police to ask for help due "can't calm down and can't sleep."States due to inability to sleep he took  five acetaminophen  PM to try to sleep which he states was ineffective. He reported being hospitalized at [facility] behavioral health.  Felt the medications he was prescribed there "did not help."  Discharge summary was reviewed with documentation reporting failing multiple antipsychotic trials including Risperdal , Haldol .  During most recent inpatient hospitalization patient was switched to Prolixin  which was titrated to 5 mg 3 times daily but had to be lowered to 5 mg twice daily due to orthostatic hypotension.  Clozapine  was initiated and titrated to 100 mg with good effect with patient reporting improvement in auditory hallucinations and paranoia.  During most recent inpatient hospitalization patient agreed to long-acting injectable of Prolixin  decanoate 12.5 mg.  He was discharged with samples as well as 30-day scripts for medications and for Prolixin  with the next dose of 12.5 mg IM due to on February 19, 2024.  It is unclear how long he continue clozapine  after hospital discharge; he states that he lost his medication.   Today, states "feeling scared, feeling worse than yesterday." He describes his mood as "calm and scared." States he was able  to sleep last night. He endorses suicidal ideation without plan or intent. He denies homicidal ideation, intent or plan.  He endorses auditory hallucinations of "a voice telling me to kill myself."  He endorses visual hallucinations of "faces." Prolixin  was restarted yesterday and patient has been adherent with medication. He has been accepted to [facility]. Will order POCT Covid testing for admission criteria to [facility].   Total Time spent with patient: 15 minutes  Past Psychiatric History:  Current psychiatrist: Previously seen by Dr. Augusta Blizzard 04/13/2023, poor followup Current therapist: none. Previous reported psychiatric diagnoses:  ?ADHD, schizoaffective disorder bipolar type, GAD Current psychiatric medications: none. Psychiatric medication history/compliance: Per chart review, poor with the exception of Adderall.  Full med trials include: adderall, bupropion, duloxetine , haloperidol , hydroxyzine  (ineffective), lorazepam , olanzapine , ziprasidone , paliperidone , risperidone ,  Psychiatric hospitalization(s): Approximately 6 stays over the past from 2012-2019.  Most recently, patient had 3 separate stays at [facility] in 2024, 6/11 - 6/19 and 6/22 - 7/1, and on 10/2023 Psychotherapy history: None Neuromodulation history: none. History of suicide (obtained from HPI): Reported 1 attempted OD in distant past, presently denied History of homicide or aggression (obtained in HPI): none. Tobacco Cessation:  Prescription not provided due to being transferred to another facility.   Current Medications:  Current Facility-Administered Medications  Medication Dose Route Frequency Provider Last Rate Last Admin   acetaminophen  (TYLENOL ) tablet 650 mg  650 mg Oral Q6H PRN Justine Cossin E, NP       alum & mag hydroxide-simeth (MAALOX/MYLANTA) 200-200-20 MG/5ML suspension 30 mL  30 mL  Oral Q4H PRN Shelbi Vaccaro E, NP       haloperidol  (HALDOL ) tablet 5 mg  5 mg Oral TID PRN Jaleah Lefevre E, NP       And    diphenhydrAMINE  (BENADRYL ) capsule 50 mg  50 mg Oral TID PRN Malcome Ambrocio E, NP       haloperidol  lactate (HALDOL ) injection 5 mg  5 mg Intramuscular TID PRN Raigen Jagielski E, NP       And   diphenhydrAMINE  (BENADRYL ) injection 50 mg  50 mg Intramuscular TID PRN Wakisha Alberts E, NP   50 mg at 02/03/24 0203   And   LORazepam  (ATIVAN ) injection 2 mg  2 mg Intramuscular TID PRN Orah Sonnen E, NP   2 mg at 02/03/24 0201   haloperidol  lactate (HALDOL ) injection 10 mg  10 mg Intramuscular TID PRN Glendi Mohiuddin E, NP       And   diphenhydrAMINE  (BENADRYL ) injection 50 mg  50 mg Intramuscular TID PRN Samarie Pinder E, NP       And   LORazepam  (ATIVAN ) injection 2 mg  2 mg Intramuscular TID PRN Toini Failla E, NP       fluPHENAZine  (PROLIXIN ) tablet 2.5 mg  2.5 mg Oral BID Vinton Layson E, NP   2.5 mg at 02/03/24 1610   hydrOXYzine  (ATARAX ) tablet 25 mg  25 mg Oral TID PRN Jasn Xia E, NP   25 mg at 02/02/24 1245   magnesium  hydroxide (MILK OF MAGNESIA) suspension 30 mL  30 mL Oral Daily PRN Harmoni Lucus E, NP       Current Outpatient Medications  Medication Sig Dispense Refill   acetaminophen  (TYLENOL ) 325 MG tablet Take 1,625 mg by mouth every 6 (six) hours as needed (For sleep).     benztropine  (COGENTIN ) 1 MG tablet Take 1 tablet (1 mg total) by mouth 2 (two) times daily as needed (for muscle spasm or dystonia only). 60 tablet 0   cloZAPine  (CLOZARIL ) 100 MG tablet Take 1 tablet (100 mg total) by mouth at bedtime. 30 tablet 0   divalproex  (DEPAKOTE ) 250 MG DR tablet Take 3 tablets (750 mg total) by mouth 2 (two) times daily. 180 tablet 0   fluPHENAZine  (PROLIXIN ) 5 MG tablet Take 0.5 tablets (2.5 mg total) by mouth 2 (two) times daily. 30 tablet 0   fluPHENAZine  decanoate (PROLIXIN ) 25 MG/ML injection Inject 0.5 mLs (12.5 mg total) into the muscle every 21 ( twenty-one) days. 0.5 mL 0   hydrOXYzine  (ATARAX ) 50 MG tablet Take 1 tablet (50 mg total) by mouth 3 (three) times daily as needed for anxiety.  30 tablet 0   mirtazapine  (REMERON  SOL-TAB) 15 MG disintegrating tablet Take 1 tablet (15 mg total) by mouth at bedtime. 30 tablet 0   nicotine  (NICODERM CQ  - DOSED IN MG/24 HOURS) 21 mg/24hr patch Place 1 patch (21 mg total) onto the skin daily at 6 (six) AM. 28 patch 0   polyethylene glycol (MIRALAX  / GLYCOLAX ) 17 g packet Take 17 g by mouth daily. 30 packet 0   senna-docusate (SENOKOT-S) 8.6-50 MG tablet Take 1 tablet by mouth 2 (two) times daily. 60 tablet 0   traZODone  (DESYREL ) 100 MG tablet Take 2 tablets (200 mg total) by mouth at bedtime as needed for sleep. 60 tablet 0    PTA Medications:  Facility Ordered Medications  Medication   acetaminophen  (TYLENOL ) tablet 650 mg   alum & mag hydroxide-simeth (MAALOX/MYLANTA) 200-200-20 MG/5ML suspension 30 mL   magnesium   hydroxide (MILK OF MAGNESIA) suspension 30 mL   haloperidol  (HALDOL ) tablet 5 mg   And   diphenhydrAMINE  (BENADRYL ) capsule 50 mg   haloperidol  lactate (HALDOL ) injection 5 mg   And   diphenhydrAMINE  (BENADRYL ) injection 50 mg   And   LORazepam  (ATIVAN ) injection 2 mg   haloperidol  lactate (HALDOL ) injection 10 mg   And   diphenhydrAMINE  (BENADRYL ) injection 50 mg   And   LORazepam  (ATIVAN ) injection 2 mg   hydrOXYzine  (ATARAX ) tablet 25 mg   fluPHENAZine  (PROLIXIN ) tablet 2.5 mg   PTA Medications  Medication Sig   fluPHENAZine  (PROLIXIN ) 5 MG tablet Take 0.5 tablets (2.5 mg total) by mouth 2 (two) times daily.   fluPHENAZine  decanoate (PROLIXIN ) 25 MG/ML injection Inject 0.5 mLs (12.5 mg total) into the muscle every 21 ( twenty-one) days.   hydrOXYzine  (ATARAX ) 50 MG tablet Take 1 tablet (50 mg total) by mouth 3 (three) times daily as needed for anxiety.   mirtazapine  (REMERON  SOL-TAB) 15 MG disintegrating tablet Take 1 tablet (15 mg total) by mouth at bedtime.   nicotine  (NICODERM CQ  - DOSED IN MG/24 HOURS) 21 mg/24hr patch Place 1 patch (21 mg total) onto the skin daily at 6 (six) AM.   traZODone  (DESYREL ) 100  MG tablet Take 2 tablets (200 mg total) by mouth at bedtime as needed for sleep.   polyethylene glycol (MIRALAX  / GLYCOLAX ) 17 g packet Take 17 g by mouth daily.   senna-docusate (SENOKOT-S) 8.6-50 MG tablet Take 1 tablet by mouth 2 (two) times daily.   benztropine  (COGENTIN ) 1 MG tablet Take 1 tablet (1 mg total) by mouth 2 (two) times daily as needed (for muscle spasm or dystonia only).   divalproex  (DEPAKOTE ) 250 MG DR tablet Take 3 tablets (750 mg total) by mouth 2 (two) times daily.   cloZAPine  (CLOZARIL ) 100 MG tablet Take 1 tablet (100 mg total) by mouth at bedtime.       11/14/2023    9:48 AM 11/07/2023    9:50 AM 04/20/2023    4:08 PM  Depression screen PHQ 2/9  Decreased Interest 0 1 1  Down, Depressed, Hopeless 0 0 0  PHQ - 2 Score 0 1 1  Altered sleeping   1  Tired, decreased energy   0  Change in appetite   0  Feeling bad or failure about yourself    0  Trouble concentrating   3  Moving slowly or fidgety/restless   3  Suicidal thoughts   0  PHQ-9 Score   8  Difficult doing work/chores   Not difficult at all    Flowsheet Row ED from 02/02/2024 in [facility] to Hosp-Admission (Discharged) [facility] from [facility]  C-SSRS RISK CATEGORY No Risk No Risk No Risk       Musculoskeletal  Strength & Muscle Tone: within normal limits Gait & Station: normal Patient leans: N/A  Psychiatric Specialty Exam  Presentation  General Appearance:  Appropriate for Environment  Eye Contact: Good  Speech: Clear and Coherent  Speech Volume: Normal  Handedness: Right   Mood and Affect  Mood: Depressed  Affect: Congruent   Thought Process  Thought Processes: Coherent  Descriptions of Associations:Intact  Orientation:Full (Time, Place and Person)  Thought Content:Illogical  Diagnosis of Schizophrenia or  Schizoaffective disorder in past: Yes  Duration of Psychotic Symptoms: Less than six months   Hallucinations:Hallucinations:  Auditory; Visual Description of Auditory Hallucinations: "voice telling me to kill myself" Description of Visual Hallucinations: "faces"  Ideas of Reference:None  Suicidal Thoughts:Suicidal Thoughts: Yes, Passive SI Passive Intent and/or Plan: Without Intent; Without Plan  Homicidal Thoughts:Homicidal Thoughts: No   Sensorium  Memory: Recent Fair; Remote Fair  Judgment: Fair  Insight: Fair   Chartered certified accountant: Fair  Attention Span: Fair  Recall: [employer] of Knowledge: Fair  Language: Fair   Psychomotor Activity  Psychomotor Activity: Psychomotor Activity: Normal   Assets  Assets: Manufacturing systems engineer; Desire for Improvement; Physical Health; Resilience   Sleep  Sleep: Sleep: Fair Number of Hours of Sleep: 7   Nutritional Assessment (For OBS and FBC admissions only) Has the patient had a weight loss or gain of 10 pounds or more in the last 3 months?: No Has the patient had a decrease in food intake/or appetite?: No Does the patient have eating habits or behaviors that may be indicators of an eating disorder including binging or inducing vomiting?: No Has the patient recently lost weight without trying?: 0 Has the patient been eating poorly because of a decreased appetite?: 0 Malnutrition Screening Tool Score: 0    Physical Exam  Physical Exam Vitals and nursing note reviewed.  HENT:     Head: Normocephalic.     Mouth/Throat:     Mouth: Mucous membranes are moist.  Cardiovascular:     Rate and Rhythm: Normal rate.  Pulmonary:     Effort: Pulmonary effort is normal.  Musculoskeletal:        General: Normal range of motion.  Skin:    General: Skin is warm and dry.  Neurological:     Mental Status: He is alert and oriented to person, place, and time.  Psychiatric:        Behavior: Behavior  normal.    Review of Systems  Constitutional:  Negative for fever.  HENT:  Negative for congestion.   Respiratory:  Negative for shortness of breath.   Cardiovascular:  Negative for chest pain and palpitations.  Psychiatric/Behavioral:  Positive for hallucinations and suicidal ideas. The patient is nervous/anxious.    Blood pressure 107/69, pulse 78, temperature 98.4 F (36.9 C), temperature source Oral, resp. rate (!) 78, SpO2 100%. There is no height or weight on file to calculate BMI.  Demographic Factors:  Male, Caucasian, and Unemployed  Loss Factors: NA   Plan Of Care/Follow-up recommendations:  Inpatient hospitalization   Disposition: Discharge to [facility] for inpatient psychiatric hospitalization   Addie Holstein, PMHNP-BC, FNP-BC  02/03/2024, 12:25 PM

## 2024-02-03 NOTE — ED Notes (Signed)
 Patient transferred to [facility] per MD order. Admission discussed with patient and no concerns were voiced. Patient discharged in no acute distress, A& O x4 and ambulatory. Patient denied SI/HI upon transfer, did endorse AVH. Patient mood fair.  Patient belongings returned to patient from locker complete and intact. Patient escorted to back sallyport via staff for transport to destination. Safety maintained.

## 2024-02-03 NOTE — Discharge Instructions (Signed)
 Transferred to [facility] for psychiatric care

## 2024-02-03 NOTE — ED Notes (Signed)
 [facility] contacted to arrange for transport to [facility]. Driver stated they would call back to arrange as they are currently with another client.

## 2024-02-03 NOTE — ED Notes (Signed)
 Patient resting in lounger with eyes closed, respirations even and unlabored. Patient in no apparent acute distress. Environment secured. Safety checks in place per facility protocol.

## 2024-02-03 NOTE — ED Notes (Signed)
 Patient alert and oriented.  Denies SI, HI, AVH at this time but states he does hear things and sees shadow people. Scheduled medications administered to patient, per MD orders. Support and encouragement provided.  Routine safety checks conducted every hour.  Patient informed to notify staff with problems or concerns. No adverse drug reactions noted. Patient contracts for safety at this time. Patient compliant with medications and treatment plan. Patient receptive, calm, and cooperative. Patient interacts well with others on the unit.  Patient remains safe at this time.

## 2024-02-03 NOTE — ED Notes (Signed)
 PRN atarax  given due to patient reports of anxiety rating 7/10. Medication administered with no complications. Environment secured, safety checks in place per facility policy.

## 2024-02-03 NOTE — ED Notes (Signed)
Pt resting quietly with eyes closed.  No pain or discomfort noted/voiced.  Breathing is even and unlabored.  Will continue to monitor for safety.  

## 2024-02-03 NOTE — ED Notes (Signed)
 [facility] [employer] contacted Clinical research associate regarding transport of patient to [facility]. Driver should be at facility within 20 minutes to transport patient.

## 2024-02-04 LAB — CLOZAPINE (CLOZARIL)
Clozapine Lvl: <20 ng/mL — ABNORMAL LOW (ref 350–600)
NorClozapine: <20 ng/mL
Total(Cloz+Norcloz): <40 ng/mL

## 2024-04-29 ENCOUNTER — Ambulatory Visit (INDEPENDENT_AMBULATORY_CARE_PROVIDER_SITE_OTHER): Admitting: Physician Assistant

## 2024-04-29 VITALS — BP 143/96 | HR 95 | Ht 74.0 in | Wt 233.2 lb

## 2024-04-29 DIAGNOSIS — F200 Paranoid schizophrenia: Secondary | ICD-10-CM

## 2024-04-29 DIAGNOSIS — F411 Generalized anxiety disorder: Secondary | ICD-10-CM

## 2024-04-29 MED ORDER — ESCITALOPRAM OXALATE 10 MG PO TABS: 10.0000 mg | ORAL_TABLET | Freq: Every day | ORAL | 1 refills | Status: DC

## 2024-04-29 NOTE — Progress Notes (Unsigned)
 [facility] MD/PA/NP OP Progress Note  04/29/2024 8:48 AM William Romero  MRN:  [id number]  Chief Complaint:  No chief complaint on file.  HPI:   William Romero. William Romero is a 43 year old male with a past psychiatric history significant for schizoaffective disorder (bipolar type), generalized anxiety disorder, and attention deficit hyperactivity disorder (unspecified ADHD type) who presents to [facility] for follow-up and medication management.  Patient is not currently being prescribed any medications at this time.  Patient presents to the encounter inquiring about the use of Clozaril  for the management of his current symptoms.  Provider explained the process that the patient would have to undergo in order to be placed on Clozaril .  Provider informed patient of the potential side effects associated with Clozaril  use.  Patient vocalized understanding.  Patient informed provider that he has been on several medications in the past and does not want to be on any of them due to them not helping his condition.  Per chart review, patient has been on the following psychiatric medications: Adderall, bupropion, duloxetine , haloperidol , hydroxyzine  (ineffective), lorazepam , olanzapine , ziprasidone , paliperidone , and risperidone .  Patient was most recently admitted to [facility] on 10/03/2023 and discharged on 10/12/2023 on the following psychiatric medications:  Benztropine  0.5 mg daily Divalproex  1000 mg at bedtime Fluphenazine  10 mg 2 times daily Hydroxyzine  50 mg 3 times daily as needed Propranolol  10 mg 3 times daily Trazodone  100 mg at bedtime  Patient continues to endorse auditory hallucinations stating that the voices that he hears has his own intelligent life and repeats things that he sees in his environment.  Patient reports that the things that the voices repeat are moral and he is tired of it.  Patient denies overt depressive symptoms but  does endorse anxiety.  He reports that his anxiety is attributed to not being on Adderall.  A GAD-7 screen was performed the patient scoring a 17.  Patient is alert and oriented x 4, calm, cooperative, and fully engaged in conversation during the encounter.  Patient endorses good mood.  Patient exhibits anxious mood with appropriate affect.  Patient denies suicidal or homicidal ideations.  He endorses auditory hallucinations.  Patient denies visual hallucinations but states that he believes that there may be an outside force causing him to have auditory hallucinations.  Patient does not appear to be responding to any internal/external stimuli.  Patient endorses fair sleep and receives on average 6 hours of sleep per night.  Patient states that he sleeps 3 hours at a time.  Patient endorses good appetite and eats on average 3 meals per day.  Patient endorses alcohol consumption on occasion.  Patient denies tobacco use.  Patient endorses illicit drug use in the form of delta products.  Visit Diagnosis:  No diagnosis found.   Past Psychiatric History:  Patient has a past psychiatric diagnosis significant for schizoaffective disorder (bipolar type)   Patient has a past history of hospitalization due to mental health - Patient was most recently hospitalized at [facility] on 10/03/2023   Patient denies a past history of suicide attempt   Patient denies a past history of homicide attempt  Past Medical History:  Past Medical History:  Diagnosis Date  . Paranoid schizophrenia (HCC) 10/03/2023  . Schizophrenia [facility])     Past Surgical History:  Procedure Laterality Date  . APPENDECTOMY      Family Psychiatric History:  Patient reports that his grandmother is mentally ill   Family  history of suicide attempt: Patient denies Family history of homicide attempt: Patient is unsure Family history of substance abuse: Patient denies  Family History: No family history on  file.  Social History:  Social History   Socioeconomic History  . Marital status: Single    Spouse name: Not on file  . Number of children: Not on file  . Years of education: Not on file  . Highest education level: Not on file  Occupational History  . Not on file  Tobacco Use  . Smoking status: Former    Current packs/day: 0.50    Types: Cigarettes  . Smokeless tobacco: Never  Vaping Use  . Vaping status: Never Used  Substance and Sexual Activity  . Alcohol use: Not Currently    Alcohol/week: 5.0 standard drinks of alcohol    Types: 5 Cans of beer per week  . Drug use: No  . Sexual activity: Not Currently  Other Topics Concern  . Not on file  Social History Narrative  . Not on file   Social Drivers of Health   Financial Resource Strain: Not on file  Food Insecurity: No Food Insecurity (02/02/2024)   Hunger Vital Sign   . Worried About Programme researcher, broadcasting/film/video in the Last Year: Never true   . Ran Out of Food in the Last Year: Never true  Transportation Needs: No Transportation Needs (02/02/2024)   PRAPARE - Transportation   . Lack of Transportation (Medical): No   . Lack of Transportation (Non-Medical): No  Physical Activity: Not on file  Stress: Not on file  Social Connections: Not on file    Allergies: No Known Allergies  Metabolic Disorder Labs: Lab Results  Component Value Date   HGBA1C 5.2 01/15/2024   MPG 103 01/15/2024   MPG 99.67 10/03/2023   No results found for: PROLACTIN Lab Results  Component Value Date   CHOL 168 01/15/2024   TRIG 172 (H) 01/15/2024   HDL 42 01/15/2024   CHOLHDL 4.0 01/15/2024   VLDL 34 01/15/2024   LDLCALC 92 01/15/2024   LDLCALC 98 10/03/2023   Lab Results  Component Value Date   TSH 1.559 01/15/2024   TSH 0.641 10/03/2023    Therapeutic Level Labs: No results found for: LITHIUM Lab Results  Component Value Date   VALPROATE 83 01/23/2024   VALPROATE 47 (L) 01/18/2024   No results found for: CBMZ  Current  Medications: No current outpatient medications on file.   No current facility-administered medications for this visit.     Musculoskeletal: Strength & Muscle Tone: within normal limits Gait & Station: normal Patient leans: N/A  Psychiatric Specialty Exam: Review of Systems  Psychiatric/Behavioral:  Positive for hallucinations (Auditory) and sleep disturbance. Negative for decreased concentration, dysphoric mood, self-injury and suicidal ideas. The patient is nervous/anxious. The patient is not hyperactive.     There were no vitals taken for this visit.There is no height or weight on file to calculate BMI.  General Appearance: Casual  Eye Contact:  Good  Speech:  Clear and Coherent and Normal Rate  Volume:  Normal  Mood:  Anxious  Affect:  Congruent  Thought Process:  Coherent, Goal Directed, and Descriptions of Associations: Intact  Orientation:  Full (Time, Place, and Person)  Thought Content: Hallucinations: Auditory   Suicidal Thoughts:  No  Homicidal Thoughts:  No  Memory:  Immediate;   Good Recent;   Good Remote;   Good  Judgement:  Fair  Insight:  Fair  Psychomotor Activity:  Normal  Concentration:  Concentration: Good and Attention Span: Good  Recall:  Fair  Fund of Knowledge: Good  Language: Good  Akathisia:  No  Handed:  Right  AIMS (if indicated): not done  Assets:  Communication Skills Desire for Improvement Transportation  ADL's:  Intact  Cognition: WNL  Sleep:  Fair   Screenings: AIMS    Flowsheet Row [facility] ([facility] Ad[facility] (Dis[facility]  AIMS Total Score 0 0 0   AUDIT    Flowsheet Row [facility]) [facility] (Dis[facility] Ad[facility] (Dis[facility]) [facility]  Alcohol Use Disorder Identification Test Final Score (AUDIT) 0 2 0 2   GAD-7    Flowsheet Row Clinical Support from 11/14/2023 in [facility] Office Visit from 11/07/2023 in [facility] Counselor from 04/20/2023 in [facility] Office Visit from 04/03/2023 in [facility] Counselor from 03/30/2023 in [facility]  Total GAD-7 Score 17 21 13 20 14    PHQ2-9    Flowsheet Row Clinical Support from 11/14/2023 in [facility] Office Visit from 11/07/2023 in [facility] Counselor from 04/20/2023 in [facility] Office Visit from 04/03/2023 in [facility] Counselor from 03/30/2023 in [facility]  PHQ-2 Total Score 0 1 1 5 2   PHQ-9 Total Score -- -- 8 14 9    Flowsheet Row ED from 02/02/2024 in [facility] ED to Hosp-Admission (Discharged) [facility] (Discharged[facility]  C-SSRS RISK CATEGORY No Risk No Risk No Risk     Assessment and Plan:   William Romero. Shahab Polhamus is a 43 year old male with a past psychiatric history significant for schizoaffective disorder (bipolar type), generalized anxiety disorder, and attention deficit hyperactivity disorder (unspecified ADHD type) who presents to [facility] for follow-up and medication management.  Patient presents today encounter requesting to be placed on Clozaril  for the management of his auditory  hallucinations.  Patient reports that he has been on several different psychiatric medications in the past and refuses to be placed back on them.  After discussing the process of going on Clozaril  with the patient, provider recommended patient be placed back on his previously prescribed medications that he was discharged with.  Provider recommended that patient rechallenge the use of the following psychiatric medications:  Benztropine  0.5 mg daily Divalproex  1000 mg at bedtime Fluphenazine  10 mg 2 times daily Hydroxyzine  50 mg 3 times daily as needed Propranolol  10 mg 3 times daily Trazodone  100 mg at bedtime  Provider instructed patient to try fluphenazine  and Depakote  for the first 4 to 6 weeks to see if they are able to manage his auditory hallucinations.  Patient was in agreement with plan.  Patient's medications to be e-prescribed to pharmacy of choice.  Collaboration of Care: Collaboration of Care: Medication Management AEB provider managing patient's psychiatric medications and Psychiatrist AEB patient being followed by mental health provider at this facility  Patient/Guardian  was advised Release of Information must be obtained prior to any record release in order to collaborate their care with an outside provider. Patient/Guardian was advised if they have not already done so to contact the registration department to sign all necessary forms in order for us  to release information regarding their care.   Consent: Patient/Guardian gives verbal consent for treatment and assignment of benefits for services provided during this visit. Patient/Guardian expressed understanding and agreed to proceed.   1. Schizoaffective disorder, bipolar type (HCC) (Primary)  - traZODone  (DESYREL ) 100 MG tablet; Take 1 tablet (100 mg total) by mouth at bedtime.  Dispense: 30 tablet; Refill: 1 - fluPHENAZine  (PROLIXIN ) 10 MG tablet; Take 1 tablet (10 mg total) by mouth 2 (two) times daily.  Dispense: 60 tablet;  Refill: 1 - divalproex  (DEPAKOTE  ER) 500 MG 24 hr tablet; Take 2 tablets (1,000 mg total) by mouth at bedtime.  Dispense: 60 tablet; Refill: 1 - benztropine  (COGENTIN ) 0.5 MG tablet; Take 1 tablet (0.5 mg total) by mouth daily.  Dispense: 30 tablet; Refill: 1  2. GAD (generalized anxiety disorder)  - hydrOXYzine  (ATARAX ) 50 MG tablet; Take 1 tablet (50 mg total) by mouth 3 (three) times daily as needed for anxiety.  Dispense: 75 tablet; Refill: 1 - propranolol  (INDERAL ) 10 MG tablet; Take 1 tablet (10 mg total) by mouth 3 (three) times daily.  Dispense: 90 tablet; Refill: 1  3. Attention deficit hyperactivity disorder (ADHD), unspecified ADHD type  Patient to follow up on 12/19/2023 Provider spent a total of 21 minutes with the patient/reviewing patient's chart  Reginia FORBES Bolster, PA 04/29/2024, 8:48 AM

## 2024-04-30 ENCOUNTER — Encounter (HOSPITAL_COMMUNITY): Payer: Self-pay | Admitting: Physician Assistant

## 2024-05-01 ENCOUNTER — Telehealth (HOSPITAL_COMMUNITY): Payer: Self-pay

## 2024-05-01 NOTE — Telephone Encounter (Signed)
 Patient reports newly prescribed medication is too expense and is requesting provider call to determine if there are any cheaper alternatives.

## 2024-05-03 NOTE — Telephone Encounter (Signed)
 Provider attempted to reach out to patient regarding cheaper alternatives to patient's medication, but no answer.

## 2024-05-08 ENCOUNTER — Telehealth (HOSPITAL_COMMUNITY): Payer: Self-pay

## 2024-05-08 NOTE — Telephone Encounter (Signed)
 Patient called left voicemail is wanting to get cheaper medication. He is wanting something on the [facility] four dollar list.

## 2024-05-10 ENCOUNTER — Ambulatory Visit (INDEPENDENT_AMBULATORY_CARE_PROVIDER_SITE_OTHER): Payer: Self-pay | Admitting: Physician Assistant

## 2024-05-10 ENCOUNTER — Other Ambulatory Visit (HOSPITAL_COMMUNITY): Payer: Self-pay

## 2024-05-10 ENCOUNTER — Ambulatory Visit: Payer: Self-pay | Admitting: *Deleted

## 2024-05-10 ENCOUNTER — Encounter: Payer: Self-pay | Admitting: Physician Assistant

## 2024-05-10 VITALS — BP 106/70 | HR 111 | Temp 98.2°F | Ht 74.0 in | Wt 227.0 lb

## 2024-05-10 DIAGNOSIS — F411 Generalized anxiety disorder: Secondary | ICD-10-CM

## 2024-05-10 DIAGNOSIS — R079 Chest pain, unspecified: Secondary | ICD-10-CM

## 2024-05-10 DIAGNOSIS — F200 Paranoid schizophrenia: Secondary | ICD-10-CM

## 2024-05-10 MED ORDER — ESCITALOPRAM OXALATE 10 MG PO TABS
10.0000 mg | ORAL_TABLET | Freq: Every day | ORAL | 0 refills | Status: DC
  Filled 2024-05-10: qty 30, 30d supply, fill #0

## 2024-05-10 MED ORDER — HYDROXYZINE HCL 25 MG PO TABS
25.0000 mg | ORAL_TABLET | Freq: Three times a day (TID) | ORAL | 0 refills | Status: DC | PRN
Start: 2024-05-10 — End: 2024-06-23
  Filled 2024-05-10: qty 12, 4d supply, fill #0

## 2024-05-10 NOTE — Progress Notes (Signed)
 William Romero is a 43 y.o. male here for a recurrence of a previously resolved problem.  History of Present Illness:   Chief Complaint  Patient presents with   Anxiety    Pt has had increase for the past 2 weeks, he is not sleeping, having chest discomfort,slight dizziness, brain fog, and sinus pressure.    For the past two weeks he has had increase of significant stress He is fearful of radon exposure in his home -- there are two rooms in his home that when he is inside of them he has intense pressure to nasal bridge, temples and a buzzing sensation in his head -- but not in any of the other rooms; roommates do not  He is working two jobs -- [employer] and [employer] -- with plans to only do one eventually but for now he is doing both to save up money  He is not sleeping at night; he has history of this that resulted in him taking 5 tylenol  PM in the past  He is eating and drinking well, reports there is no alcohol, marijuana or stimulant use  Per chart review, he has history of non-compliance and has had multiple treatment failures  He was recently prescribed Lexapro  but did not start due to cost  Reports there is no shortness of breath, nausea, vomiting   Past Medical History:  Diagnosis Date   Paranoid schizophrenia (HCC) 10/03/2023   Schizophrenia (HCC)      Social History   Tobacco Use   Smoking status: Former    Current packs/day: 0.50    Types: Cigarettes   Smokeless tobacco: Never  Vaping Use   Vaping status: Never Used  Substance Use Topics   Alcohol use: Not Currently    Alcohol/week: 5.0 standard drinks of alcohol    Types: 5 Cans of beer per week   Drug use: No    Past Surgical History:  Procedure Laterality Date   APPENDECTOMY      No family history on file.  No Known Allergies  Current Medications:   Current Outpatient Medications:    escitalopram  (LEXAPRO ) 10 MG tablet, Take 1 tablet (10 mg total) by mouth daily. Patient to take half  tablet (5 mg total) for 6 days, then continuing taking full tablet (10 mg total) daily. (Patient not taking: Reported on 05/10/2024), Disp: 30 tablet, Rfl: 1   Review of Systems:   Negative unless otherwise specified per HPI.  Vitals:   Vitals:   05/10/24 1141  BP: 106/70  Pulse: (!) 111  Temp: 98.2 F (36.8 C)  TempSrc: Temporal  SpO2: 95%  Weight: 227 lb (103 kg)  Height: 6' 2 (1.88 m)     Body mass index is 29.15 kg/m.  Physical Exam:   Physical Exam Vitals and nursing note reviewed.  Constitutional:      Appearance: He is well-developed.  HENT:     Head: Normocephalic.  Eyes:     Conjunctiva/sclera: Conjunctivae normal.     Pupils: Pupils are equal, round, and reactive to light.  Pulmonary:     Effort: Pulmonary effort is normal.  Musculoskeletal:        General: Normal range of motion.     Cervical back: Normal range of motion.  Skin:    General: Skin is warm and dry.  Neurological:     Mental Status: He is alert and oriented to person, place, and time.  Psychiatric:        Attention and Perception:  Attention normal.        Mood and Affect: Mood is anxious.        Speech: Speech normal.        Behavior: Behavior is hyperactive.        Thought Content: Thought content is paranoid.        Judgment: Judgment normal.     Assessment and Plan:   Chest pain, unspecified type EKG tracing is personally reviewed.  EKG notes sinus tachycardia.  No acute changes.  No red flags with current symptom(s) Discussed that he should continue to pursue better control of his mental health Offered blood work today for further evaluation of symptom(s) however he declined due to cost If worsening chest pain, he was recommended to go to the ER  GAD (generalized anxiety disorder) Uncontrolled Discussed that Lexapro  is currently on $5 list at [facility] - will refill this there and I discussed this plan with this psychiatrist through secure chat. He repeatedly asks me for  something to help with stress during this encounter -- I offered hydroxyzine , buspar, or seroquel and he declined all of these as they have been too mild for him Discussed that additional prescriptions should come from his psychiatrist or [facility] Reports there is no suicidal ideation/hi at today's visit  Paranoid schizophrenia (HCC) Uncontrolled Reports there is no suicidal ideation/hi Recommend continued efforts to try to get Medicaid  I personally spent a total of 65 minutes in the care of the patient today including preparing to see the patient, getting/reviewing separately obtained history, performing a medically appropriate exam/evaluation, counseling and educating, placing orders, referring and communicating with other health care professionals, documenting clinical information in the EHR, independently interpreting results, and coordinating care.   Lucie Buttner, PA-C

## 2024-05-10 NOTE — Telephone Encounter (Signed)
 FYI Only or Action Required?: FYI only for provider.  Patient was last seen in primary care on 03/22/2023 by Theophilus Andrews, Tully GRADE, MD.  Called Nurse Triage reporting Chest Pain.  Symptoms began several weeks ago.  Interventions attempted: Nothing.  Symptoms are: unchanged.  Triage Disposition: See Physician Within 24 Hours  Patient/caregiver understands and will follow disposition?: Yes   Reason for Disposition  [1] Chest pain lasts < 5 minutes AND [2] NO chest pain or cardiac symptoms (e.g., breathing difficulty, sweating) now  (Exception: Chest pains that last only a few seconds.)  Answer Assessment - Initial Assessment Questions 1. LOCATION: Where does it hurt?       All over chest 2. RADIATION: Does the pain go anywhere else? (e.g., into neck, jaw, arms, back)     Pressure in sinus 3. ONSET: When did the chest pain begin? (Minutes, hours or days)      2 weeks ago 4. PATTERN: Does the pain come and go, or has it been constant since it started?  Does it get worse with exertion?      Constant, not worse with exertion 5. DURATION: How long does it last (e.g., seconds, minutes, hours)     Never goes away- not sleeping 6. SEVERITY: How bad is the pain?  (e.g., Scale 1-10; mild, moderate, or severe)     Discomfort-2/10 7. CARDIAC RISK FACTORS: Do you have any history of heart problems or risk factors for heart disease? (e.g., angina, prior heart attack; diabetes, high blood pressure, high cholesterol, smoker, or strong family history of heart disease)     no 8. PULMONARY RISK FACTORS: Do you have any history of lung disease?  (e.g., blood clots in lung, asthma, emphysema, birth control pills)     no 9. CAUSE: What do you think is causing the chest pain?     stress 10. OTHER SYMPTOMS: Do you have any other symptoms? (e.g., dizziness, nausea, vomiting, sweating, fever, difficulty breathing, cough)       Dizziness- brief  Protocols used: Chest  Pain-A-AH   Copied from CRM #[id number]. Topic: Clinical - Red Word Triage >> May 10, 2024  8:02 AM Berneda FALCON wrote: Red Word that prompted transfer to Nurse Triage: Pt states he has been having chest pains for about 2 weeks. He believes this is stress-related.

## 2024-05-20 ENCOUNTER — Other Ambulatory Visit (HOSPITAL_COMMUNITY): Payer: Self-pay

## 2024-06-11 ENCOUNTER — Encounter (HOSPITAL_COMMUNITY): Admitting: Physician Assistant

## 2024-06-23 ENCOUNTER — Other Ambulatory Visit: Payer: Self-pay

## 2024-06-23 ENCOUNTER — Inpatient Hospital Stay (HOSPITAL_COMMUNITY)
Admission: AD | Admit: 2024-06-23 | Discharge: 2024-07-02 | DRG: 885 | Disposition: A | Discharge status: Home / Self Care | Payer: MEDICAID

## 2024-06-23 ENCOUNTER — Ambulatory Visit (HOSPITAL_COMMUNITY)
Admission: EM | Admit: 2024-06-23 | Discharge: 2024-06-23 | Disposition: A | Discharge status: Other Acute Inpatient Hospital | Payer: MEDICAID | Attending: Nurse Practitioner | Admitting: Nurse Practitioner

## 2024-06-23 ENCOUNTER — Encounter (HOSPITAL_COMMUNITY): Payer: Self-pay

## 2024-06-23 DIAGNOSIS — F259 Schizoaffective disorder, unspecified: Secondary | ICD-10-CM | POA: Diagnosis present

## 2024-06-23 DIAGNOSIS — F17210 Nicotine dependence, cigarettes, uncomplicated: Secondary | ICD-10-CM | POA: Diagnosis present

## 2024-06-23 DIAGNOSIS — F329 Major depressive disorder, single episode, unspecified: Secondary | ICD-10-CM | POA: Diagnosis present

## 2024-06-23 DIAGNOSIS — Z62810 Personal history of physical and sexual abuse in childhood: Secondary | ICD-10-CM | POA: Insufficient documentation

## 2024-06-23 DIAGNOSIS — G4700 Insomnia, unspecified: Secondary | ICD-10-CM | POA: Diagnosis present

## 2024-06-23 DIAGNOSIS — F203 Undifferentiated schizophrenia: Secondary | ICD-10-CM | POA: Insufficient documentation

## 2024-06-23 DIAGNOSIS — Z91148 Patient's other noncompliance with medication regimen for other reason: Secondary | ICD-10-CM

## 2024-06-23 DIAGNOSIS — F909 Attention-deficit hyperactivity disorder, unspecified type: Secondary | ICD-10-CM | POA: Diagnosis present

## 2024-06-23 DIAGNOSIS — F411 Generalized anxiety disorder: Secondary | ICD-10-CM | POA: Diagnosis present

## 2024-06-23 LAB — CBC WITH DIFFERENTIAL/PLATELET
Abs Immature Granulocytes: 0.02 K/uL (ref 0.00–0.07)
Basophils Absolute: 0.1 K/uL (ref 0.0–0.1)
Basophils Relative: 1 %
Eosinophils Absolute: 0.2 K/uL (ref 0.0–0.5)
Eosinophils Relative: 4 %
HCT: 43.5 % (ref 39.0–52.0)
Hemoglobin: 14.7 g/dL (ref 13.0–17.0)
Immature Granulocytes: 0 %
Lymphocytes Relative: 20 %
Lymphs Abs: 1 K/uL (ref 0.7–4.0)
MCH: 29.9 pg (ref 26.0–34.0)
MCHC: 33.8 g/dL (ref 30.0–36.0)
MCV: 88.4 fL (ref 80.0–100.0)
Monocytes Absolute: 0.4 K/uL (ref 0.1–1.0)
Monocytes Relative: 8 %
Neutro Abs: 3.2 K/uL (ref 1.7–7.7)
Neutrophils Relative %: 67 %
Platelets: 305 K/uL (ref 150–400)
RBC: 4.92 MIL/uL (ref 4.22–5.81)
RDW: 12 % (ref 11.5–15.5)
WBC: 4.8 K/uL (ref 4.0–10.5)
nRBC: 0 % (ref 0.0–0.2)

## 2024-06-23 LAB — COMPREHENSIVE METABOLIC PANEL WITH GFR
ALT: 24 U/L (ref 0–44)
AST: 24 U/L (ref 15–41)
Albumin: 3.2 g/dL — ABNORMAL LOW (ref 3.5–5.0)
Alkaline Phosphatase: 66 U/L (ref 38–126)
Anion gap: 7 (ref 5–15)
BUN: 10 mg/dL (ref 6–20)
CO2: 26 mmol/L (ref 22–32)
Calcium: 8.5 mg/dL — ABNORMAL LOW (ref 8.9–10.3)
Chloride: 106 mmol/L (ref 98–111)
Creatinine, Ser: 1.3 mg/dL — ABNORMAL HIGH (ref 0.61–1.24)
GFR, Estimated: >60 mL/min (ref >60)
Glucose, Bld: 93 mg/dL (ref 70–99)
Potassium: 4.8 mmol/L (ref 3.5–5.1)
Sodium: 139 mmol/L (ref 135–145)
Total Bilirubin: 0.5 mg/dL (ref 0.0–1.2)
Total Protein: 5.5 g/dL — ABNORMAL LOW (ref 6.5–8.1)

## 2024-06-23 LAB — LIPID PANEL
Cholesterol: 180 mg/dL (ref 0–200)
HDL: 41 mg/dL (ref >40)
LDL Cholesterol: 119 mg/dL — ABNORMAL HIGH (ref 0–99)
Total CHOL/HDL Ratio: 4.4 ratio
Triglycerides: 102 mg/dL (ref <150)
VLDL: 20 mg/dL (ref 0–40)

## 2024-06-23 LAB — ETHANOL: Alcohol, Ethyl (B): <15 mg/dL (ref <15)

## 2024-06-23 LAB — HEMOGLOBIN A1C
Hgb A1c MFr Bld: 4.8 % (ref 4.8–5.6)
Mean Plasma Glucose: 91.06 mg/dL

## 2024-06-23 LAB — MAGNESIUM: Magnesium: 1.9 mg/dL (ref 1.7–2.4)

## 2024-06-23 LAB — TSH: TSH: 0.968 u[IU]/mL (ref 0.350–4.500)

## 2024-06-23 LAB — HIV ANTIBODY (ROUTINE TESTING W REFLEX): HIV Screen 4th Generation wRfx: NONREACTIVE

## 2024-06-23 MED ORDER — BENZTROPINE MESYLATE 0.5 MG PO TABS
0.5000 mg | ORAL_TABLET | Freq: Two times a day (BID) | ORAL | Status: DC
  Administered 2024-06-23: 0.5 mg via ORAL
  Filled 2024-06-23: qty 1

## 2024-06-23 MED ORDER — MAGNESIUM HYDROXIDE 400 MG/5ML PO SUSP: 30.0000 mL | Freq: Every day | ORAL | Status: DC | PRN

## 2024-06-23 MED ORDER — LORAZEPAM 2 MG/ML IJ SOLN: 2.0000 mg | Freq: Three times a day (TID) | INTRAMUSCULAR | Status: DC | PRN

## 2024-06-23 MED ORDER — TRAZODONE HCL 50 MG PO TABS: 50.0000 mg | ORAL_TABLET | Freq: Every evening | ORAL | Status: DC | PRN

## 2024-06-23 MED ORDER — HALOPERIDOL LACTATE 5 MG/ML IJ SOLN: 10.0000 mg | Freq: Three times a day (TID) | INTRAMUSCULAR | Status: DC | PRN

## 2024-06-23 MED ORDER — FLUPHENAZINE HCL 5 MG PO TABS
5.0000 mg | ORAL_TABLET | Freq: Two times a day (BID) | ORAL | Status: DC
  Administered 2024-06-23 – 2024-06-25 (×4): 5 mg via ORAL
  Filled 2024-06-23 (×4): qty 1

## 2024-06-23 MED ORDER — ACETAMINOPHEN 325 MG PO TABS: 650.0000 mg | ORAL_TABLET | Freq: Four times a day (QID) | ORAL | Status: DC | PRN

## 2024-06-23 MED ORDER — ALUM & MAG HYDROXIDE-SIMETH 200-200-20 MG/5ML PO SUSP: 30.0000 mL | ORAL | Status: DC | PRN

## 2024-06-23 MED ORDER — HYDROXYZINE HCL 25 MG PO TABS
25.0000 mg | ORAL_TABLET | Freq: Three times a day (TID) | ORAL | Status: DC | PRN
  Administered 2024-06-23: 25 mg via ORAL
  Filled 2024-06-23: qty 1

## 2024-06-23 MED ORDER — HALOPERIDOL 5 MG PO TABS: 5.0000 mg | ORAL_TABLET | Freq: Three times a day (TID) | ORAL | Status: DC | PRN

## 2024-06-23 MED ORDER — DIPHENHYDRAMINE HCL 50 MG PO CAPS: 50.0000 mg | ORAL_CAPSULE | Freq: Three times a day (TID) | ORAL | Status: DC | PRN

## 2024-06-23 MED ORDER — DIPHENHYDRAMINE HCL 50 MG/ML IJ SOLN: 50.0000 mg | Freq: Three times a day (TID) | INTRAMUSCULAR | Status: DC | PRN

## 2024-06-23 MED ORDER — BENZTROPINE MESYLATE 0.5 MG PO TABS
0.5000 mg | ORAL_TABLET | Freq: Two times a day (BID) | ORAL | Status: DC
  Administered 2024-06-23 – 2024-07-02 (×18): 0.5 mg via ORAL
  Filled 2024-06-23 (×18): qty 1

## 2024-06-23 MED ORDER — ACETAMINOPHEN 325 MG PO TABS
650.0000 mg | ORAL_TABLET | Freq: Four times a day (QID) | ORAL | Status: DC | PRN
  Administered 2024-06-27 – 2024-07-01 (×2): 650 mg via ORAL
  Filled 2024-06-23 (×2): qty 2

## 2024-06-23 MED ORDER — FLUPHENAZINE HCL 5 MG PO TABS
5.0000 mg | ORAL_TABLET | Freq: Two times a day (BID) | ORAL | Status: DC
  Administered 2024-06-23: 5 mg via ORAL
  Filled 2024-06-23: qty 1

## 2024-06-23 MED ORDER — HALOPERIDOL LACTATE 5 MG/ML IJ SOLN: 5.0000 mg | Freq: Three times a day (TID) | INTRAMUSCULAR | Status: DC | PRN

## 2024-06-23 MED ORDER — HALOPERIDOL 5 MG PO TABS
5.0000 mg | ORAL_TABLET | Freq: Three times a day (TID) | ORAL | Status: DC | PRN
  Administered 2024-06-23 – 2024-06-27 (×5): 5 mg via ORAL
  Filled 2024-06-23 (×5): qty 1

## 2024-06-23 MED ORDER — DIPHENHYDRAMINE HCL 25 MG PO CAPS
50.0000 mg | ORAL_CAPSULE | Freq: Three times a day (TID) | ORAL | Status: DC | PRN
  Administered 2024-06-23 – 2024-06-27 (×5): 50 mg via ORAL
  Filled 2024-06-23 (×5): qty 2

## 2024-06-23 MED ORDER — TRAZODONE HCL 50 MG PO TABS
50.0000 mg | ORAL_TABLET | Freq: Every evening | ORAL | Status: DC | PRN
  Administered 2024-06-23 – 2024-07-01 (×9): 50 mg via ORAL
  Filled 2024-06-23 (×10): qty 1
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
## 2024-06-23 NOTE — Progress Notes (Signed)
(  Sleep Hours) -9.5  (Any PRNs that were needed, meds refused, or side effects to meds)- hydroxyzine  25mg ; Trazodone  50mg   (Any disturbances and when (visitation, over night)-none  (Concerns raised by the patient)- insomnia; admits continuing auditory hallucinations; hearing persons from his past; denies voices telling to hurt himself or anyone else right now  (SI/HI/AVH)-denies

## 2024-06-23 NOTE — Plan of Care (Signed)
  Problem: Education: Goal: Mental status will improve Outcome: Progressing Goal: Verbalization of understanding the information provided will improve Outcome: Progressing   Problem: Activity: Goal: Sleeping patterns will improve Outcome: Progressing

## 2024-06-23 NOTE — Plan of Care (Signed)
   Problem: Education: Goal: Knowledge of [facility] Education information/materials will improve Outcome: Progressing Goal: Verbalization of understanding the information provided will improve Outcome: Progressing
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
## 2024-06-23 NOTE — Plan of Care (Signed)
   Problem: Education: Goal: Emotional status will improve Outcome: Not Progressing Goal: Mental status will improve Outcome: Not Progressing

## 2024-06-23 NOTE — Progress Notes (Signed)
 BHH/BMU [name] Progress Note   06/23/2024    11:27 AM  William Romero   [id number]   Type of Contact and Topic:  Psychiatric Bed Placement   Pt accepted to [facility] 501-1   Patient meets inpatient criteria per William Snell, NP   The attending provider will be Dr. Prentis  Call report to [phone]    [facility], RN @ [facility] notified.     Pt scheduled  to arrive at [facility] TODAY.    Bunnie Gallop, MSW, [name]-A  11:28 AM 06/23/2024

## 2024-06-23 NOTE — ED Notes (Signed)
 Patient transferred to [facility]. Patient discharged in no acute distress, A& O x4 and ambulatory. Patient denied SI/HI, A/VH upon discharge. Patient verbalized understanding of transfer to next facility for continuation of care. Patient mood fair. Patient belongings returned to patient from locker #13 complete and intact. Patient escorted to back sallyport via staff for transport to destination. Safety maintained.
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
## 2024-06-23 NOTE — Discharge Instructions (Signed)
 Please transfer patient to a higher level of care at the [facility] for treatment and stabilization of thought disorder.

## 2024-06-23 NOTE — Progress Notes (Signed)
 Prentice JINNY Angle, RN, 06/23/24, Time of arrival: 47  Patient is a new admit to unit. Patient is voluntary. Patient belongings addressed and stored. Skin check performed with two staff, results unremarkable. Vital signs unremarkable. No reported or observed physiological concerns or abnormalities. Patient engaged with assessment with encouragement. Patient orientated to facility, unit and room. All questions and concerns addressed at this time.  Patient stated reason for admission/reason for being here: Reports constant CAH I want to make sure I'm not gonan hurt other people   Patient current presentation remarkable for: Brief, rapid, pressured speech. Brief avertive eye contact.Patient requested meeting with chaplain.  Patient history and collateral remarkable for: Hx schizoaffective bipolar type, GAD, ADHD. Patient reports new onset of CAH 1 year ago. CAH are constant and disturb patient as they say immoral things. Patient expressing religious preoccupation and or delusion stating he thinks his CAH might be a Ambulance person. Patient reports prior tx for CAH has been ineffective. Patient reports poor sleep.

## 2024-06-23 NOTE — Tx Team (Signed)
 Initial Treatment Plan 06/23/2024 7:04 PM Gadiel SCOTT Laverne FMW:[id number]    PATIENT STRESSORS: Other: CAH     PATIENT STRENGTHS: Other: Not sure   PATIENT IDENTIFIED PROBLEMS: Reports constant CAH I want to make sure I'm not gonan hurt other people                      DISCHARGE CRITERIA:  Improved stabilization in mood, thinking, and/or behavior  PRELIMINARY DISCHARGE PLAN:  PATIENT/FAMILY INVOLVEMENT: This treatment plan has been presented to and reviewed with the patient, Stylianos SCOTT Gawthrop, and/or family member.  The patient and family have been given the opportunity to ask questions and make suggestions.  Prentice JINNY Angle, RN 06/23/2024, 7:04 PM

## 2024-06-23 NOTE — ED Notes (Signed)
 Patient admitted to continuous observation unit d/t  command auditory hallucinations. Calm, cooperative throughout interview process. Skin assessment completed. Oriented to unit. Meal and drink offered.  Patient alert & oriented x4. Denies intent to harm self or others when asked. Denies VH, endorses CAH of demonic voices telling him many things including murder and stuff. Patient voices he is concerned he may hurt someone, no signs of aggression and agitation at this time. Patient denies any physical complaints when asked. No acute distress noted. Support and encouragement provided. Routine safety checks conducted per facility protocol. Encouraged patient to notify staff if any thoughts of harm towards self or others arise. Patient verbalizes understanding and agreement.

## 2024-06-24 ENCOUNTER — Encounter (HOSPITAL_COMMUNITY): Payer: Self-pay

## 2024-06-24 LAB — URINALYSIS, ROUTINE W REFLEX MICROSCOPIC
Bilirubin Urine: NEGATIVE
Glucose, UA: NEGATIVE mg/dL
Hgb urine dipstick: NEGATIVE
Ketones, ur: NEGATIVE mg/dL
Leukocytes,Ua: NEGATIVE
Nitrite: NEGATIVE
Protein, ur: NEGATIVE mg/dL
Specific Gravity, Urine: 1.011 (ref 1.005–1.030)
pH: 7 (ref 5.0–8.0)

## 2024-06-24 LAB — RPR: RPR Ser Ql: NONREACTIVE

## 2024-06-24 MED ORDER — HYDROXYZINE HCL 50 MG PO TABS
50.0000 mg | ORAL_TABLET | Freq: Three times a day (TID) | ORAL | Status: DC | PRN
  Administered 2024-06-25 – 2024-07-01 (×10): 50 mg via ORAL
  Filled 2024-06-24 (×10): qty 1
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
## 2024-06-24 NOTE — Plan of Care (Signed)
  Problem: Education: Goal: Emotional status will improve Outcome: Progressing Goal: Mental status will improve Outcome: Progressing   Problem: Activity: Goal: Interest or engagement in activities will improve Outcome: Not Progressing   

## 2024-06-24 NOTE — Group Note (Signed)
 [name] Group Therapy Note   Group Date: 06/24/2024 Start Time: 1300 End Time: 1350   Type of Therapy and Topic:  Group Therapy: Boundaries  Participation Level:  Did Not Attend  Description of Group: This group will address the use of boundaries in their personal lives. Patients will explore why boundaries are important, the difference between healthy and unhealthy boundaries, and negative and postive outcomes of different boundaries and will look at how boundaries can be crossed.  Patients will be encouraged to identify current boundaries in their own lives and identify what kind of boundary is being set. Facilitators will guide patients in utilizing problem-solving interventions to address and correct types boundaries being used and to address when no boundary is being used. Understanding and applying boundaries will be explored and addressed for obtaining and maintaining a balanced life. Patients will be encouraged to explore ways to assertively make their boundaries and needs known to significant others in their lives, using other group members and facilitator for role play, support, and feedback.  Therapeutic Goals:  1.  Patient will identify areas in their life where setting clear boundaries could be  used to improve their life.  2.  Patient will identify signs/triggers that a boundary is not being respected. 3.  Patient will identify two ways to set boundaries in order to achieve balance in  their lives: 4.  Patient will demonstrate ability to communicate their needs and set boundaries  through discussion and/or role plays  Summary of Patient Progress:  Patient did not attend.  Therapeutic Modalities:   Cognitive Behavioral Therapy Solution-Focused Therapy  Louetta Lame, LCSWA 06/24/2024  4:07 PM

## 2024-06-24 NOTE — Progress Notes (Signed)
   06/24/24 1016  Psych Admission Type (Psych Patients Only)  Admission Status Voluntary  Psychosocial Assessment  Patient Complaints Anxiety  Eye Contact Fair  Facial Expression Flat  Affect Preoccupied  Speech Logical/coherent  Interaction Minimal;Guarded  Motor Activity Other (Comment) (WDL)  Appearance/Hygiene Unremarkable  Behavior Characteristics Guarded  Mood Apprehensive;Preoccupied  Thought Process  Coherency WDL  Content Preoccupation  Delusions None reported or observed  Perception Hallucinations  Hallucination Auditory  Judgment Poor  Confusion None  Danger to Self  Current suicidal ideation? Denies  Agreement Not to Harm Self Yes  Description of Agreement Verbal  Danger to Others  Danger to Others None reported or observed

## 2024-06-24 NOTE — BH IP Treatment Plan (Signed)
 Interdisciplinary Treatment and Diagnostic Plan Update  06/24/2024 Time of Session: 10:20AM William Romero MRN: [id number]  Principal Diagnosis: Schizoaffective disorder (HCC)  Secondary Diagnoses: Principal Problem:   Schizoaffective disorder (HCC)   Current Medications:  Current Facility-Administered Medications  Medication Dose Route Frequency Provider Last Rate Last Admin   acetaminophen  (TYLENOL ) tablet 650 mg  650 mg Oral Q6H PRN Tex Drilling, NP       alum & mag hydroxide-simeth (MAALOX/MYLANTA) 200-200-20 MG/5ML suspension 30 mL  30 mL Oral Q4H PRN Tex Drilling, NP       benztropine  (COGENTIN ) tablet 0.5 mg  0.5 mg Oral BID Tex Drilling, NP   0.5 mg at 06/24/24 9085   haloperidol  (HALDOL ) tablet 5 mg  5 mg Oral TID PRN Tex Drilling, NP   5 mg at 06/24/24 1006   And   diphenhydrAMINE  (BENADRYL ) capsule 50 mg  50 mg Oral TID PRN Tex Drilling, NP   50 mg at 06/24/24 1006   haloperidol  lactate (HALDOL ) injection 5 mg  5 mg Intramuscular TID PRN Tex Drilling, NP       And   diphenhydrAMINE  (BENADRYL ) injection 50 mg  50 mg Intramuscular TID PRN Tex Drilling, NP       And   LORazepam  (ATIVAN ) injection 2 mg  2 mg Intramuscular TID PRN Tex Drilling, NP       haloperidol  lactate (HALDOL ) injection 10 mg  10 mg Intramuscular TID PRN Tex Drilling, NP       And   diphenhydrAMINE  (BENADRYL ) injection 50 mg  50 mg Intramuscular TID PRN Tex Drilling, NP       And   LORazepam  (ATIVAN ) injection 2 mg  2 mg Intramuscular TID PRN Tex Drilling, NP       fluPHENAZine  (PROLIXIN ) tablet 5 mg  5 mg Oral BID Tex Drilling, NP   5 mg at 06/24/24 9085   hydrOXYzine  (ATARAX ) tablet 25 mg  25 mg Oral TID PRN Tex Drilling, NP   25 mg at 06/23/24 2139   magnesium  hydroxide (MILK OF MAGNESIA) suspension 30 mL  30 mL Oral Daily PRN Tex Drilling, NP       traZODone  (DESYREL ) tablet 50 mg  50 mg Oral QHS PRN Tex Drilling, NP   50 mg at 06/23/24 2139   PTA  Medications: Medications Prior to Admission  Medication Sig Dispense Refill Last Dose/Taking   VYVANSE 50 MG capsule Take 50 mg by mouth daily.       Patient Stressors: Other: CAH    Patient Strengths: Other: Not sure  Treatment Modalities: Medication Management, Group therapy, Case management,  1 to 1 session with clinician, Psychoeducation, Recreational therapy.   Physician Treatment Plan for Primary Diagnosis: Schizoaffective disorder (HCC) Long Term Goal(s):     Short Term Goals:    Medication Management: Evaluate patient's response, side effects, and tolerance of medication regimen.  Therapeutic Interventions: 1 to 1 sessions, Unit Group sessions and Medication administration.  Evaluation of Outcomes: Not Progressing  Physician Treatment Plan for Secondary Diagnosis: Principal Problem:   Schizoaffective disorder (HCC)  Long Term Goal(s):     Short Term Goals:       Medication Management: Evaluate patient's response, side effects, and tolerance of medication regimen.  Therapeutic Interventions: 1 to 1 sessions, Unit Group sessions and Medication administration.  Evaluation of Outcomes: Not Progressing   RN Treatment Plan for Primary Diagnosis: Schizoaffective disorder (HCC) Long Term Goal(s): Knowledge of disease and therapeutic regimen to maintain health will improve  Short Term Goals: Ability to remain free from injury will improve, Ability to verbalize frustration and anger appropriately will improve, Ability to demonstrate self-control, Ability to participate in decision making will improve, and Ability to verbalize feelings will improve  Medication Management: RN will administer medications as ordered by provider, will assess and evaluate patient's response and provide education to patient for prescribed medication. RN will report any adverse and/or side effects to prescribing provider.  Therapeutic Interventions: 1 on 1 counseling sessions, Psychoeducation,  Medication administration, Evaluate responses to treatment, Monitor vital signs and CBGs as ordered, Perform/monitor CIWA, COWS, AIMS and Fall Risk screenings as ordered, Perform wound care treatments as ordered.  Evaluation of Outcomes: Not Progressing   [name] Treatment Plan for Primary Diagnosis: Schizoaffective disorder (HCC) Long Term Goal(s): Safe transition to appropriate next level of care at discharge, Engage patient in therapeutic group addressing interpersonal concerns.  Short Term Goals: Engage patient in aftercare planning with referrals and resources, Increase social support, Increase ability to appropriately verbalize feelings, Increase emotional regulation, and Facilitate acceptance of mental health diagnosis and concerns  Therapeutic Interventions: Assess for all discharge needs, 1 to 1 time with Social worker, Explore available resources and support systems, Assess for adequacy in community support network, Educate family and significant other(s) on suicide prevention, Complete Psychosocial Assessment, Interpersonal group therapy.  Evaluation of Outcomes: Not Progressing   Progress in Treatment: Attending groups: No. Participating in groups: No. Taking medication as prescribed: Yes. Toleration medication: Yes. Family/Significant other contact made: No, will contact:  family/friends once consent is given. Patient understands diagnosis: Yes. Discussing patient identified problems/goals with staff: Yes. Medical problems stabilized or resolved: Yes. Denies suicidal/homicidal ideation: Yes. Issues/concerns per patient self-inventory: No.  Patient Goals:  Nothing  Discharge Plan or Barriers: Patient likely to discharge back to home once stable.  Reason for Continuation of Hospitalization: Delusions  Medication stabilization  Estimated Length of Stay: 7-10 days  Last 3 [place] Suicide Severity Risk Score: Flowsheet Row [facility] Most recent reading at 06/23/2024  7:35 PM ED from 06/23/2024 in [facility] Most recent reading at 06/23/2024 11:08 AM Clinical Support from 04/29/2024 in [facility] Most recent reading at 04/29/2024  8:49 AM  C-SSRS RISK CATEGORY No Risk No Risk No Risk    Last PHQ 2/9 Scores:    05/10/2024   11:50 AM 04/29/2024    8:47 AM 11/14/2023    9:48 AM  Depression screen PHQ 2/9  Decreased Interest 0 1 0  Down, Depressed, Hopeless 0 3 0  PHQ - 2 Score 0 4 0  Altered sleeping 3 2   Tired, decreased energy 3 0   Change in appetite 0 0   Feeling bad or failure about yourself  0 0   Trouble concentrating 3 3   Moving slowly or fidgety/restless 1 3   Suicidal thoughts 0 0   PHQ-9 Score 10 12   Difficult doing work/chores Extremely dIfficult Very difficult     Scribe for Treatment Team: Tore Carreker M Skyylar Kopf, [place]RA[place] 06/24/2024 11:35 AM

## 2024-06-24 NOTE — Group Note (Signed)
 Date:  06/24/2024 Time:  9:25 AM  Group Topic/Focus:  Goals Group:   The focus of this group is to help patients establish daily goals to achieve during treatment and discuss how the patient can incorporate goal setting into their daily lives to aide in recovery.    Participation Level:  Did Not Attend  Participation Quality:    Affect:    Cognitive:    Insight:   Engagement in Group:    Modes of Intervention:    Additional Comments:    William Romero 06/24/2024, 9:25 AM

## 2024-06-24 NOTE — Progress Notes (Signed)
(  Sleep Hours) -16  (Any PRNs that were needed, meds refused, or side effects to meds)- Trazodone  50mg   (Any disturbances and when (visitation, over night)-none  (Concerns raised by the patient)- none  (SI/HI/AVH)-Denies SI/HI. Acknowledges continued auditory hallucinations. Denies command or belittling hallucinations.

## 2024-06-24 NOTE — [facility] Suicide Risk Assessment (Addendum)
 Suicide Risk Assessment  Admission Assessment    [facility] Admission Suicide Risk Assessment   Nursing information obtained from:  Patient Demographic factors:  Male, Caucasian Current Mental Status:  Thoughts of violence towards others Loss Factors:  NA Historical Factors:  NA Risk Reduction Factors:  NA  Total Time spent with patient: 1.5 hours Principal Problem: Schizoaffective disorder (HCC) Diagnosis:  Principal Problem:   Schizoaffective disorder (HCC)  Subjective Data: William Romero. William Romero is a 43 year old male with a past psychiatric history of schizophrenia, reported ADHD, GAD and episodes of depression who presented to [facility] on 06/23/24 with acute worsening psychosis.    Continued Clinical Symptoms:  Alcohol Use Disorder Identification Test Final Score (AUDIT): 0 The Alcohol Use Disorders Identification Test, Guidelines for Use in Primary Care, Second Edition.  World Science writer [facility]). Score between 0-7:  no or low risk or alcohol related problems. Score between 8-15:  moderate risk of alcohol related problems. Score between 16-19:  high risk of alcohol related problems. Score 20 or above:  warrants further diagnostic evaluation for alcohol dependence and treatment.   CLINICAL FACTORS:   Schizophrenia:   Paranoid or undifferentiated type More than one psychiatric diagnosis Currently Psychotic Previous Psychiatric Diagnoses and Treatments   Musculoskeletal: Strength & Muscle Tone: within normal limits Gait & Station: normal Patient leans: N/A  Psychiatric Specialty Exam:  Presentation  General Appearance:  Casual  Eye Contact: Fair  Speech: Clear and Coherent  Speech Volume: Normal  Handedness: Right   Mood and Affect  Mood: Depressed; Anxious  Affect: Congruent   Thought Process  Thought Processes: Coherent  Descriptions of Associations:Intact  Orientation:Full (Time, Place and  Person)  Thought Content:Illogical  History of Schizophrenia/Schizoaffective disorder:Yes  Duration of Psychotic Symptoms:Less than six months  Hallucinations:Hallucinations: Auditory; Tactile Description of Auditory Hallucinations: AH of the demon's voice-tactile of the devil touching him inappropriately.  Ideas of Reference:Paranoia; Percusatory  Suicidal Thoughts:Suicidal Thoughts: No  Homicidal Thoughts:Homicidal Thoughts: No   Sensorium  Memory: Immediate Fair  Judgment: Fair  Insight: Fair   Chartered certified accountant: Fair  Attention Span: Fair  Recall: Fair  Fund of Knowledge: Fair  Language: Fair   Psychomotor Activity  Psychomotor Activity: Psychomotor Activity: Normal   Assets  Assets: Desire for Improvement   Sleep  Sleep: Sleep: Fair    Physical Exam: Physical Exam ROS Blood pressure 121/77, pulse 74, temperature 98.2 F (36.8 C), temperature source Oral, resp. rate 18, height 6' (1.829 m), weight 102.1 kg, SpO2 96%. Body mass index is 30.52 kg/m.   COGNITIVE FEATURES THAT CONTRIBUTE TO RISK:  Loss of executive function    SUICIDE RISK:   Minimal: No identifiable suicidal ideation.  Patients presenting with no risk factors but with morbid ruminations; may be classified as minimal risk based on the severity of the depressive symptoms  PLAN OF CARE: See H&P for assessment and plan.   I certify that inpatient services furnished can reasonably be expected to improve the patient's condition.   Blair Chiquita Hint, NP 06/24/2024, 10:27 AM

## 2024-06-24 NOTE — [facility] Suicide Risk Assessment (Signed)
 [facility] INPATIENT:  Family/Significant Other Suicide Prevention Education  Suicide Prevention Education:   Suicide Prevention Education was reviewed thoroughly with patient, including risk factors, warning signs, and what to do.  Mobile Crisis services were described and that telephone number pointed out, with encouragement to patient to put this number in personal cell phone.  Brochure was provided to patient to share with natural supports.  Patient acknowledged the ways in which they are at risk, and how working through each of their issues can gradually start to reduce their risk factors.  Patient was encouraged to think of the information in the context of people in their own lives.  Patient denied having access to firearms  Patient verbalized understanding of information provided.  Patient endorsed a desire to live.        Golda Louder LCSWA 06/24/2024, 10:13 AM

## 2024-06-24 NOTE — Progress Notes (Signed)
 Chaplains received a spiritual care consult that William Romero was requesting prayer.  After reviewing chart and speaking with nurses, we will plan to wait and see William Romero later in the week so that we don't exacerbate hyper-religiosity.

## 2024-06-24 NOTE — Plan of Care (Signed)
   Problem: Education: Goal: Emotional status will improve Outcome: Progressing Goal: Mental status will improve Outcome: Progressing Goal: Verbalization of understanding the information provided will improve Outcome: Progressing

## 2024-06-24 NOTE — Progress Notes (Signed)
 Patient is visible agitated and reports his agitation being a 5 out of 10. Mild agitation medications given.
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
## 2024-06-24 NOTE — Group Note (Signed)
 Recreation Therapy Group Note   Group Topic:General Recreation  Group Date: 06/24/2024 Start Time: 1010 End Time: 1030 Facilitators: Cassie Shedlock-McCall, LRT,CTRS Location: [address] Day[address]   Group Topic/Focus: General Recreation   Goal Area(s) Addresses:  Patient will use appropriate interactions with peers.   Patient will follow directions.  Behavioral Response:   Intervention: Art and Movies  Activity: Patient with peers allowed  the opportunity to engage in art activities or watching a movie during recreation therapy group session today.    Affect/Mood: N/A   Participation Level: Did not attend    Clinical Observations/Individualized Feedback:     Plan: Continue to engage patient in RT group sessions 2-3x/week.   Faizah Kandler-McCall, LRT,CTRS 06/24/2024 1:21 PM

## 2024-06-24 NOTE — [facility] Suicide Risk Assessment (Signed)
 [facility] INPATIENT:  Family/Significant Other Suicide Prevention Education  Suicide Prevention Education:  Patient Refusal for Family/Significant Other Suicide Prevention Education: The patient William Romero has refused to provide written consent for family/significant other to be provided Family/Significant Other Suicide Prevention Education during admission and/or prior to discharge.  Physician notified.  Chole Driver LCSWA 06/24/2024, 10:12 AM

## 2024-06-25 MED ORDER — FLUPHENAZINE HCL 5 MG PO TABS
10.0000 mg | ORAL_TABLET | Freq: Two times a day (BID) | ORAL | Status: DC
  Administered 2024-06-25 – 2024-06-26 (×2): 10 mg via ORAL
  Filled 2024-06-25 (×2): qty 2

## 2024-06-25 NOTE — Plan of Care (Signed)
   Problem: Education: Goal: Verbalization of understanding the information provided will improve Outcome: Progressing

## 2024-06-25 NOTE — Group Note (Signed)
 Date:  06/25/2024 Time:  11:33 AM  Group Topic/Focus:  Pet Therapy:   This group aims to reduce stress and anxiety, improve mood, and increase feelings of comfort using animals.   Participation Level:  Did Not Attend   William Romero 06/25/2024, 11:33 AM

## 2024-06-25 NOTE — Progress Notes (Signed)
 [facility] MD Progress Note  06/25/2024 4:04 PM William Romero  MRN:  [id number]  Principal Problem: Schizoaffective disorder [facility]) Diagnosis: Principal Problem:   Schizoaffective disorder (HCC)  Reason for Admission:  William Romero. William Romero is a 43 year old male with a past psychiatric history of schizophrenia, reported ADHD, GAD and episodes of depression who presented to [facility] on 06/24/24 with acute worsening psychosis.   24-hour Chart Review: Chart reviewed. Patient's case discussed in interdisciplinary team meeting.  Vital signs reviewed without critical value. PRN Trazodone  required overnight. He slept a documented 16.0 hours.  He is adherent to taking psychotropic medication regimen. Nursing notes indicate patient acknowledges continued auditory hallucinations; Denies command or belittling hallucinations.   Today's Assessment Notes:  William Romero was seen in his room lying in bed. He reports no issues with sleep and states his appetite is fair, reporting he ate breakfast on the unit rather than in the cafeteria. He describes his concentration and energy as adequate. He denies suicidal ideation, including passive thoughts of death or self-harm urges. He endorses auditory hallucinations of hearing a "demon voice," which he reports are neither command nor derogatory in nature. He denies visual hallucinations but continues to report tactile hallucinations of being inappropriately touched by a demon. Patient states that his current medications are "not working." The option of increasing Prolixin  to help manage psychotic symptoms was discussed, and the patient agreed with this plan. He also inquired about the duration of his hospitalization, stating that he needs to notify his employer.   Total Time spent with patient: 45 minutes  Past Psychiatric History:  Previous reported psychiatric diagnoses: MDD, ?ADHD, schizophrenia, GAD Current psychiatrist:   Previously seen by Dr. Prentice Romero 04/13/2023, Did not follow-up  Current therapist: none.   Current psychiatric medications: none. Psychiatric medication history/compliance: Per chart review, poor.  Full med trials include: adderall, bupropion, duloxetine , haloperidol , hydroxyzine  (ineffective), lorazepam , olanzapine , ziprasidone , paliperidone , risperidone ,   Psychiatric hospitalization(s): Multiple inpatient hospitalizations at [facility] 6/11-6/19/2024, 6/22-03/06/2023, 1/25-10/12/2023. Most recent stay was 5/10-5/20/2025  Psychotherapy history: None Neuromodulation history: none. History of suicide (obtained from HPI): Reported 1 attempted OD in distant past, presently denied History of homicide or aggression (obtained in HPI): none. Rehab History: Has previously lived at [facility]   Past Medical History:  Past Medical History:  Diagnosis Date   Paranoid schizophrenia (HCC) 10/03/2023   Schizophrenia [facility])     Past Surgical History:  Procedure Laterality Date   APPENDECTOMY     Family History: History reviewed. No pertinent family history. Family Psychiatric  History: See H&P  Social History:  Social History   Substance and Sexual Activity  Alcohol Use Not Currently   Alcohol/week: 5.0 standard drinks of alcohol   Types: 5 Cans of beer per week     Social History   Substance and Sexual Activity  Drug Use No    Social History   Socioeconomic History   Marital status: Single    Spouse name: Not on file   Number of children: Not on file   Years of education: Not on file   Highest education level: Not on file  Occupational History   Not on file  Tobacco Use   Smoking status: Former    Current packs/day: 0.50    Types: Cigarettes   Smokeless tobacco: Never  Vaping Use   Vaping status: Never Used  Substance and Sexual Activity   Alcohol use: Not Currently    Alcohol/week: 5.0 standard drinks  of alcohol    Types: 5 Cans of beer per week   Drug use: No   Sexual  activity: Not Currently  Other Topics Concern   Not on file  Social History Narrative   Reports new onset CAH at 43 years old, denies prior hx   Social Drivers of Corporate investment banker Strain: Not on file  Food Insecurity: No Food Insecurity (06/23/2024)   Hunger Vital Sign    Worried About Running Out of Food in the Last Year: Never true    Ran Out of Food in the Last Year: Never true  Transportation Needs: No Transportation Needs (06/23/2024)   PRAPARE - Administrator, Civil Service (Medical): No    Lack of Transportation (Non-Medical): No  Physical Activity: Not on file  Stress: Not on file  Social Connections: Unknown (06/23/2024)   Social Connection and Isolation Panel    Frequency of Communication with Friends and Family: Not on file    Frequency of Social Gatherings with Friends and Family: Never    Attends Religious Services: Never    Database administrator or Organizations: No    Attends Engineer, structural: Never    Marital Status: Patient declined   Additional Social History:                          Current Medications: Current Facility-Administered Medications  Medication Dose Route Frequency Provider Last Rate Last Admin   acetaminophen  (TYLENOL ) tablet 650 mg  650 mg Oral Q6H PRN Tex Drilling, NP       alum & mag hydroxide-simeth (MAALOX/MYLANTA) 200-200-20 MG/5ML suspension 30 mL  30 mL Oral Q4H PRN Tex Drilling, NP       benztropine  (COGENTIN ) tablet 0.5 mg  0.5 mg Oral BID Tex Drilling, NP   0.5 mg at 06/25/24 9185   haloperidol  (HALDOL ) tablet 5 mg  5 mg Oral TID PRN Tex Drilling, NP   5 mg at 06/25/24 1143   And   diphenhydrAMINE  (BENADRYL ) capsule 50 mg  50 mg Oral TID PRN Tex Drilling, NP   50 mg at 06/25/24 1143   haloperidol  lactate (HALDOL ) injection 5 mg  5 mg Intramuscular TID PRN Tex Drilling, NP       And   diphenhydrAMINE  (BENADRYL ) injection 50 mg  50 mg Intramuscular TID PRN Tex Drilling, NP        And   LORazepam  (ATIVAN ) injection 2 mg  2 mg Intramuscular TID PRN Tex Drilling, NP       haloperidol  lactate (HALDOL ) injection 10 mg  10 mg Intramuscular TID PRN Tex Drilling, NP       And   diphenhydrAMINE  (BENADRYL ) injection 50 mg  50 mg Intramuscular TID PRN Tex Drilling, NP       And   LORazepam  (ATIVAN ) injection 2 mg  2 mg Intramuscular TID PRN Tex Drilling, NP       fluPHENAZine  (PROLIXIN ) tablet 10 mg  10 mg Oral BID Sir Mallis H, NP       hydrOXYzine  (ATARAX ) tablet 50 mg  50 mg Oral TID PRN Romello Hoehn H, NP   50 mg at 06/25/24 9185   magnesium  hydroxide (MILK OF MAGNESIA) suspension 30 mL  30 mL Oral Daily PRN Tex Drilling, NP       traZODone  (DESYREL ) tablet 50 mg  50 mg Oral QHS PRN Tex Drilling, NP   50 mg at 06/24/24 2023  Lab Results:  Results for orders placed or performed during the hospital encounter of 06/23/24 (from the past 48 hours)  Urinalysis, Routine w reflex microscopic -Urine, Clean Catch     Status: Abnormal   Collection Time: 06/23/24  5:40 PM  Result Value Ref Range   Color, Urine STRAW (A) YELLOW   APPearance CLEAR CLEAR   Specific Gravity, Urine 1.011 1.005 - 1.030   pH 7.0 5.0 - 8.0   Glucose, UA NEGATIVE NEGATIVE mg/dL   Hgb urine dipstick NEGATIVE NEGATIVE   Bilirubin Urine NEGATIVE NEGATIVE   Ketones, ur NEGATIVE NEGATIVE mg/dL   Protein, ur NEGATIVE NEGATIVE mg/dL   Nitrite NEGATIVE NEGATIVE   Leukocytes,Ua NEGATIVE NEGATIVE    Comment: Performed at [facility], [address], [place], [place] [address]    Blood Alcohol level:  Lab Results  Component Value Date   ETH <15 06/23/2024   ETH 23 (H) 02/02/2024    Metabolic Disorder Labs: Lab Results  Component Value Date   HGBA1C 4.8 06/23/2024   MPG 91.06 06/23/2024   MPG 103 01/15/2024   No results found for: PROLACTIN Lab Results  Component Value Date   CHOL 180 06/23/2024   TRIG 102 06/23/2024   HDL 41 06/23/2024    CHOLHDL 4.4 06/23/2024   VLDL 20 06/23/2024   LDLCALC 119 (H) 06/23/2024   LDLCALC 92 01/15/2024    Physical Findings: AIMS:  ,  ,0  ,  ,  ,  ,   CIWA:   N/A COWS:   N/A  Musculoskeletal: Strength & Muscle Tone: within normal limits Gait & Station: normal Patient leans: N/A  Psychiatric Specialty Exam:  Presentation  General Appearance:  Casual  Eye Contact: Minimal  Speech: Clear and Coherent  Speech Volume: Normal  Handedness: Right   Mood and Affect  Mood: Dysphoric  Affect: Congruent   Thought Process  Thought Processes: Coherent  Descriptions of Associations:Intact  Orientation:Full (Time, Place and Person)  Thought Content:Perseveration  History of Schizophrenia/Schizoaffective disorder:Yes  Duration of Psychotic Symptoms:Less than six months  Hallucinations:Hallucinations: Auditory  Ideas of Reference:Paranoia; Percusatory  Suicidal Thoughts:Suicidal Thoughts: No  Homicidal Thoughts:Homicidal Thoughts: No   Sensorium  Memory: Immediate Fair  Judgment: Impaired  Insight: Shallow   Executive Functions  Concentration: Fair  Attention Span: Fair  Recall: Fair  Fund of Knowledge: Fair  Language: Fair   Psychomotor Activity  Psychomotor Activity:Psychomotor Activity: Decreased   Assets  Assets: Desire for Improvement; Physical Health   Sleep  Sleep:Sleep: Fair    Physical Exam: Physical Exam Vitals and nursing note reviewed.  Constitutional:      General: He is not in acute distress.    Appearance: He is not ill-appearing.  HENT:     Mouth/Throat:     Pharynx: Oropharynx is clear.  Cardiovascular:     Rate and Rhythm: Normal rate.     Pulses: Normal pulses.  Skin:    General: Skin is dry.  Neurological:     Mental Status: He is alert and oriented to person, place, and time.    Review of Systems  Psychiatric/Behavioral:  Positive for hallucinations.    Blood pressure 121/77, pulse 74,  temperature 98.2 F (36.8 C), temperature source Oral, resp. rate 18, height 6' (1.829 m), weight 102.1 kg, SpO2 96%. Body mass index is 30.52 kg/m.   Treatment Plan Summary: Daily contact with patient to assess and evaluate symptoms and progress in treatment and Medication management  06/25/2024: Patient appears irritable with  flat affect and minimal eye contact. He appears guarded but cooperative with assessment. Persistent auditory and tactile hallucinations indicate ongoing psychotic symptoms despite current medication regimen. Given limited response to treatment, Prolixin  will be increased to 10 mg twice daily for improved management of psychosis. A no-roommate order remains active for safety. Nursing reported he received mild agitation medication this afternoon. Continued inpatient hospitalization is recommended to monitor medication adjustment, assess tolerability, and ongoing stabilization.   PLAN: Safety and Monitoring:             -- Voluntary admission to inpatient psychiatric unit for safety, stabilization and treatment             -- Daily contact with patient to assess and evaluate symptoms and progress in treatment             -- Patient's case to be discussed in multi-disciplinary team meeting             -- Observation Level: q15 minute checks             -- Vital signs:  q12 hours             -- Precautions: suicide, elopement, and assault   2. Psychiatric Diagnoses and Treatment:  # Schizophrenia   -- Increase Prolixin  to 10 mg 2 times daily, psychosis             -- Continue Cogentin  0.5 mg 2 times daily, EPS prevention   -- Continue hydroxyzine  50 mg oral, 3 times daily as needed, anxiety -- Trazodone  50 mg, oral, daily at bedtime as needed, sleep             -- Haldol  BH Agitation Protocol (See MAR)                   3. Medical Issues Being Addressed:           N/A     4. Labs  -- CBC: Unremarkable             -- CMP: Creatinine 1.30; Calcium 8.5; Total  protein 5.5; Albumin 3.2, otherwise WNL             -- Ethanol: <15             -- Lipid Panel: LDL cholesterol 119, otherwise WNL             -- HIV: Non Reactive             -- HgBA1c: WNL             -- Magnesium : WNL             -- Prolactin: In process             -- RPR: Non Reactive             -- TSH: WNL             -- UDS: Active              -- UA: Straw urine color, otherwise WNL              -- EKG: QT/QTc 378/449    -- The risks/benefits/side-effects/alternatives to this medication were discussed in detail with the patient and time was given for questions. The patient consents to medication trial.  -- FDA -- Metabolic profile and EKG monitoring obtained while on an atypical antipsychotic (BMI: Lipid Panel: HbgA1c: QTc:)               --  Encouraged patient to participate in unit milieu and in scheduled group therapies  -- Short Term Goals: Ability to identify changes in lifestyle to reduce recurrence of condition will improve, Ability to verbalize feelings will improve, Ability to disclose and discuss suicidal ideas, Ability to demonstrate self-control will improve, Ability to identify and develop effective coping behaviors will improve, Ability to maintain clinical measurements within normal limits will improve, Compliance with prescribed medications will improve, and Ability to identify triggers associated with substance abuse/mental health issues will improve             -- Long Term Goals: Improvement in symptoms so as ready for discharge     5. Discharge Planning:  -- Social work and case management to assist with discharge planning and identification of hospital follow-up needs prior to discharge -- Estimated LOS: 5-7 days -- Discharge Concerns: Need to establish a safety plan; Medication compliance and effectiveness -- Discharge Goals: Return home with outpatient referrals for mental health follow-up including medication management/psychotherapy      Physician  Treatment Plan for Primary Diagnosis: Schizoaffective disorder (HCC) Long Term Goal(s): Improvement in symptoms so as ready for discharge   Short Term Goals: Ability to identify changes in lifestyle to reduce recurrence of condition will improve and Ability to identify triggers associated with substance abuse/mental health issues will improve   I certify that inpatient services furnished can reasonably be expected to improve the patient's condition.    Blair Chiquita Hint, NP 06/25/2024, 4:04 PM

## 2024-06-25 NOTE — Progress Notes (Signed)
(  Sleep Hours) -15.5  (Any PRNs that were needed, meds refused, or side effects to meds)- hydroxyzine  50mg ; Trazodone  50mg   (Any disturbances and when (visitation, over night)-none  (Concerns raised by the patient)- anxiety  (SI/HI/AVH)-Admits continuing auditory hallucinations; no command hallucinations

## 2024-06-25 NOTE — Progress Notes (Signed)
 Patient denies SI, HI and AVH this shift Patient has exhibited no behavioral dsycontrol this shift. Patient has attended groups, engaged in 1:1 staff talks, and been medication compliant.  Assess patient's safety, offer medications as prescribed, engage patient in 1:1 staff talks.  Continue to monitor as planned. Patient able to contract for safety.

## 2024-06-25 NOTE — [facility] Group Notes (Signed)
 Adult Psychoeducational Group Note  Date:  06/25/2024 Time:  8:53 PM  Group Topic/Focus:  Wrap-Up Group:   The focus of this group is to help patients review their daily goal of treatment and discuss progress on daily workbooks.  Participation Level:  Did Not Attend  William Romero 06/25/2024, 8:53 PM

## 2024-06-25 NOTE — Group Note (Signed)
 Recreation Therapy Group Note   Group Topic:Coping Skills  Group Date: 06/25/2024 Start Time: 1055 End Time: 1115 Facilitators: Vylette Strubel-McCall, LRT,CTRS Location: [address] Dayroom   Group Topic: Coping Skills    Goal Area(s) Addresses: Patient will define what a coping skill is. Patient will work with to create a list of healthy coping skills beginning with each letter of the alphabet. Patient will successfully identify positive coping skills they can use post d/c.  Patient will acknowledge benefit(s) of using learned coping skills post d/c.   Behavioral Response:    Intervention: Group work   Activity: Coping A to Z. Patient asked to identify what a coping skill is and when they use them. Patients with Clinical research associate discussed healthy versus unhealthy coping skills. Next patients were given a blank worksheet titled Coping Skills A-Z. Patients were instructed to come up with at least one positive coping skill per letter of the alphabet. Patients were given 15 minutes to brainstorm before ideas were presented to the large group. Patients and LRT debriefed on the importance of coping skill selection based on situation and back-up plans when a skill tried is not effective. At the end of group, patients were given an handout of alphabetized strategies to keep for future reference.   Education: Pharmacologist, Scientist, physiological, Discharge Planning.    Education Outcome: Acknowledges education/Verbalizes understanding/In group clarification offered/Additional education needed   Affect/Mood: N/A   Participation Level: Did not attend    Clinical Observations/Individualized Feedback:      Plan: Continue to engage patient in RT group sessions 2-3x/week.   Melyna Huron-McCall, LRT,CTRS 06/25/2024 1:29 PM

## 2024-06-25 NOTE — Progress Notes (Signed)
 Recreation Therapy Notes  Patient admitted to unit 10.19.25. Due to admission within last year, no new recreation therapy assessment conducted at this time. Last assessment conducted on 5.13.25.    Reason for current admission per patient, voices.  Patient reports voices as current stressor.  Patient reports goal of to get better.  Patient denies SI, HI and VH at this time.  Patient reports hearing voices telling him immoral things.    Information found below from assessment conducted 5.13.25.  Coping Skills: Journal, Sports, Exercise, Meditate, Deep Breathing, Talk, Art, Prayer, Avoidance, Read, Hot Bath/Shower  Leisure Interests: get plans together for the future  Patient Strengths: Positive, Organizational, Learn quickly  Areas of Improvement: always improve daily    Makaylyn Sinyard-McCall, LRT,CTRS [place] A Moyses Pavey-McCall 06/25/2024 3:02 PM

## 2024-06-26 LAB — PROLACTIN: Prolactin: 15.6 ng/mL (ref 3.9–22.7)

## 2024-06-26 MED ORDER — HALOPERIDOL 5 MG PO TABS
10.0000 mg | ORAL_TABLET | Freq: Two times a day (BID) | ORAL | Status: DC
  Administered 2024-06-26 – 2024-06-29 (×6): 10 mg via ORAL
  Filled 2024-06-26 (×6): qty 2

## 2024-06-26 NOTE — Progress Notes (Signed)
   06/26/24 0840  Psych Admission Type (Psych Patients Only)  Admission Status Voluntary  Psychosocial Assessment  Patient Complaints Anxiety  Eye Contact Fair  Facial Expression Flat  Affect Preoccupied  Speech Logical/coherent  Interaction Guarded  Motor Activity Other (Comment) (WDL for pt)  Appearance/Hygiene Unremarkable  Behavior Characteristics Guarded  Mood Apprehensive  Thought Process  Coherency WDL  Content Preoccupation  Delusions None reported or observed  Perception Hallucinations  Hallucination Auditory  Judgment Poor  Confusion None  Danger to Self  Current suicidal ideation? Denies  Agreement Not to Harm Self Yes  Description of Agreement verbal  Danger to Others  Danger to Others None reported or observed

## 2024-06-26 NOTE — [facility] Group Notes (Signed)
 Adult Psychoeducational Group Note  Date:  06/26/2024 Time:  8:43 PM  Group Topic/Focus:  Wrap-Up Group:   The focus of this group is to help patients review their daily goal of treatment and discuss progress on daily workbooks.  Participation Level:  Did Not Attend  William Romero 06/26/2024, 8:43 PM

## 2024-06-26 NOTE — Progress Notes (Signed)
 [facility] MD Progress Note  06/26/2024 2:29 PM Javad SHOWN DISSINGER  MRN:  [id number]  Principal Problem: Schizoaffective disorder [facility]) Diagnosis: Principal Problem:   Schizoaffective disorder (HCC)  Reason for Admission:  Tomasz Steeves. Laquentin Loudermilk is a 43 year old male with a past psychiatric history of schizophrenia, reported ADHD, GAD and episodes of depression who presented to [facility] on 06/24/24 with acute worsening psychosis.   24-hour Chart Review: Chart reviewed. Patient's case discussed in interdisciplinary team meeting.  Vital signs reviewed without critical value. PRN Trazodone  required overnight. He slept a documented 15.5 hours.  He is adherent to taking psychotropic medication regimen. Nursing notes indicate patient acknowledges diminished auditory hallucinations; Denies command or belittling hallucinations.   Today's Assessment Notes: On evaluation today patient presents alert, cooperative, calm, and oriented to person, time, place, and situation.  He reports sleeping the whole night and being restful. Reports appetite is fair, reporting he ate all breakfast and lunch on the unit rather than in the cafeteria. He describes his concentration and energy as adequate. He denies suicidal ideation, including passive thoughts of death or self-harm urges. He endorses auditory hallucinations of hearing a "demon voice," which he reports are neither command nor derogatory in nature. He denies visual hallucinations. The option of changing from Prolixin  to Haldol  10 mg p.o. twice daily due to cost to help manage psychotic symptoms was discussed, and the patient agreed with this plan.  Plan is to discharge patient  Haldol  LAI q. monthly.  He also inquired about how long to stay in the hospital, stating that he needs to notify his employer.  Made patient aware that the team will continue to monitor patient's symptoms for improvement prior to discharge.  Total Time  spent with patient: 45 minutes  Past Psychiatric History:  Previous reported psychiatric diagnoses: MDD, ?ADHD, schizophrenia, GAD Current psychiatrist:  Previously seen by Dr. Prentice Espy 04/13/2023, Did not follow-up  Current therapist: none.   Current psychiatric medications: none. Psychiatric medication history/compliance: Per chart review, poor.  Full med trials include: adderall, bupropion, duloxetine , haloperidol , hydroxyzine  (ineffective), lorazepam , olanzapine , ziprasidone , paliperidone , risperidone ,   Psychiatric hospitalization(s): Multiple inpatient hospitalizations at [facility] 6/11-6/19/2024, 6/22-03/06/2023, 1/25-10/12/2023. Most recent stay was 5/10-5/20/2025  Psychotherapy history: None Neuromodulation history: none. History of suicide (obtained from HPI): Reported 1 attempted OD in distant past, presently denied History of homicide or aggression (obtained in HPI): none. Rehab History: Has previously lived at [facility] house   Past Medical History:  Past Medical History:  Diagnosis Date   Paranoid schizophrenia (HCC) 10/03/2023   Schizophrenia [facility])     Past Surgical History:  Procedure Laterality Date   APPENDECTOMY     Family History: History reviewed. No pertinent family history. Family Psychiatric  History: See H&P  Social History:  Social History   Substance and Sexual Activity  Alcohol Use Not Currently   Alcohol/week: 5.0 standard drinks of alcohol   Types: 5 Cans of beer per week     Social History   Substance and Sexual Activity  Drug Use No    Social History   Socioeconomic History   Marital status: Single    Spouse name: Not on file   Number of children: Not on file   Years of education: Not on file   Highest education level: Not on file  Occupational History   Not on file  Tobacco Use   Smoking status: Former    Current packs/day: 0.50    Types: Cigarettes  Smokeless tobacco: Never  Vaping Use   Vaping status: Never Used  Substance and  Sexual Activity   Alcohol use: Not Currently    Alcohol/week: 5.0 standard drinks of alcohol    Types: 5 Cans of beer per week   Drug use: No   Sexual activity: Not Currently  Other Topics Concern   Not on file  Social History Narrative   Reports new onset CAH at 43 years old, denies prior hx   Social Drivers of Corporate investment banker Strain: Not on file  Food Insecurity: No Food Insecurity (06/23/2024)   Hunger Vital Sign    Worried About Running Out of Food in the Last Year: Never true    Ran Out of Food in the Last Year: Never true  Transportation Needs: No Transportation Needs (06/23/2024)   PRAPARE - Administrator, Civil Service (Medical): No    Lack of Transportation (Non-Medical): No  Physical Activity: Not on file  Stress: Not on file  Social Connections: Unknown (06/23/2024)   Social Connection and Isolation Panel    Frequency of Communication with Friends and Family: Not on file    Frequency of Social Gatherings with Friends and Family: Never    Attends Religious Services: Never    Database administrator or Organizations: No    Attends Engineer, structural: Never    Marital Status: Patient declined   Additional Social History:     Current Medications: Current Facility-Administered Medications  Medication Dose Route Frequency Provider Last Rate Last Admin   acetaminophen  (TYLENOL ) tablet 650 mg  650 mg Oral Q6H PRN Tex Drilling, NP       alum & mag hydroxide-simeth (MAALOX/MYLANTA) 200-200-20 MG/5ML suspension 30 mL  30 mL Oral Q4H PRN Tex Drilling, NP       benztropine  (COGENTIN ) tablet 0.5 mg  0.5 mg Oral BID Tex Drilling, NP   0.5 mg at 06/26/24 9146   haloperidol  (HALDOL ) tablet 5 mg  5 mg Oral TID PRN Tex Drilling, NP   5 mg at 06/26/24 1058   And   diphenhydrAMINE  (BENADRYL ) capsule 50 mg  50 mg Oral TID PRN Tex Drilling, NP   50 mg at 06/26/24 1059   haloperidol  lactate (HALDOL ) injection 5 mg  5 mg Intramuscular TID PRN  Tex Drilling, NP       And   diphenhydrAMINE  (BENADRYL ) injection 50 mg  50 mg Intramuscular TID PRN Tex Drilling, NP       And   LORazepam  (ATIVAN ) injection 2 mg  2 mg Intramuscular TID PRN Tex Drilling, NP       haloperidol  lactate (HALDOL ) injection 10 mg  10 mg Intramuscular TID PRN Tex Drilling, NP       And   diphenhydrAMINE  (BENADRYL ) injection 50 mg  50 mg Intramuscular TID PRN Tex Drilling, NP       And   LORazepam  (ATIVAN ) injection 2 mg  2 mg Intramuscular TID PRN Tex Drilling, NP       haloperidol  (HALDOL ) tablet 10 mg  10 mg Oral BID Charisma Charlot C, FNP       hydrOXYzine  (ATARAX ) tablet 50 mg  50 mg Oral TID PRN Bennett, Christal H, NP   50 mg at 06/26/24 9146   magnesium  hydroxide (MILK OF MAGNESIA) suspension 30 mL  30 mL Oral Daily PRN Tex Drilling, NP       traZODone  (DESYREL ) tablet 50 mg  50 mg Oral QHS PRN Tex Drilling,  NP   50 mg at 06/25/24 2031   Lab Results:  No results found for this or any previous visit (from the past 48 hours).  Blood Alcohol level:  Lab Results  Component Value Date   ETH <15 06/23/2024   ETH 23 (H) 02/02/2024   Metabolic Disorder Labs: Lab Results  Component Value Date   HGBA1C 4.8 06/23/2024   MPG 91.06 06/23/2024   MPG 103 01/15/2024   Lab Results  Component Value Date   PROLACTIN 15.6 06/23/2024   Lab Results  Component Value Date   CHOL 180 06/23/2024   TRIG 102 06/23/2024   HDL 41 06/23/2024   CHOLHDL 4.4 06/23/2024   VLDL 20 06/23/2024   LDLCALC 119 (H) 06/23/2024   LDLCALC 92 01/15/2024    Physical Findings: AIMS:  ,  ,0  ,  ,  ,  ,   CIWA:   N/A COWS:   N/A  Musculoskeletal: Strength & Muscle Tone: within normal limits Gait & Station: normal Patient leans: N/A  Psychiatric Specialty Exam:  Presentation  General Appearance:  Casual  Eye Contact: Fair  Speech: Clear and Coherent; Normal Rate  Speech Volume: Normal  Handedness: Right  Mood and Affect  Mood: Depressed;  Anxious  Affect: Congruent  Thought Process  Thought Processes: Coherent  Descriptions of Associations:Intact  Orientation:Full (Time, Place and Person)  Thought Content:Perseveration  History of Schizophrenia/Schizoaffective disorder:Yes  Duration of Psychotic Symptoms:Less than six months  Hallucinations:Hallucinations: Auditory  Ideas of Reference:Paranoia; Percusatory  Suicidal Thoughts:Suicidal Thoughts: No  Homicidal Thoughts:Homicidal Thoughts: No  Sensorium  Memory: Immediate Fair; Recent Fair  Judgment: Impaired  Insight: Shallow  Executive Functions  Concentration: Fair  Attention Span: Fair  Recall: Fair  Fund of Knowledge: Fair  Language: Fair  Psychomotor Activity  Psychomotor Activity:Psychomotor Activity: Decreased  Assets  Assets: Communication Skills; Desire for Improvement  Sleep  Sleep:Sleep: Good Number of Hours of Sleep: 15.5  Physical Exam: Physical Exam Vitals and nursing note reviewed.  Constitutional:      General: He is not in acute distress.    Appearance: He is not ill-appearing.  HENT:     Head: Normocephalic.     Right Ear: External ear normal.     Left Ear: External ear normal.     Mouth/Throat:     Pharynx: Oropharynx is clear.  Eyes:     Extraocular Movements: Extraocular movements intact.  Cardiovascular:     Rate and Rhythm: Normal rate.     Pulses: Normal pulses.  Pulmonary:     Effort: Pulmonary effort is normal.  Abdominal:     Comments: Deferred   Genitourinary:    Comments: Deferred  Musculoskeletal:        General: Normal range of motion.     Cervical back: Normal range of motion.  Skin:    General: Skin is dry.  Neurological:     Mental Status: He is alert and oriented to person, place, and time.    Review of Systems  Psychiatric/Behavioral:  Positive for hallucinations.    Blood pressure 125/89, pulse 78, temperature 98.2 F (36.8 C), temperature source Oral, resp. rate 18,  height 6' (1.829 m), weight 102.1 kg, SpO2 97%. Body mass index is 30.52 kg/m.  Treatment Plan Summary: Daily contact with patient to assess and evaluate symptoms and progress in treatment and Medication management  06/26/2024: Patient appears less irritable with flat affect and fair eye contact. He appears guarded but cooperative with assessment. Reduced auditory and tactile  hallucinations.  Prolixin  was changed to Haldol  10 mg p.o. twice daily, with plan to discharge patient Haldol  LAI q. monthly.  Nursing reported he received mild agitation medication. Continued inpatient hospitalization is recommended to monitor medication adjustment, assess tolerability, and ongoing stabilization.  PLAN: Safety and Monitoring:             -- Voluntary admission to inpatient psychiatric unit for safety, stabilization and treatment             -- Daily contact with patient to assess and evaluate symptoms and progress in treatment             -- Patient's case to be discussed in multi-disciplinary team meeting             -- Observation Level: q15 minute checks             -- Vital signs:  q12 hours             -- Precautions: suicide, elopement, and assault   2. Psychiatric Diagnoses and Treatment:  # Schizophrenia   --Initiate Haldol  10 mg p.o. twice daily for psychosis.  Plan is to discharge patient on Haldol  LAI q. monthly -- Discontinue Prolixin  to 10 mg 2 times daily, psychosis             -- Continue Cogentin  0.5 mg 2 times daily, EPS prevention   -- Continue hydroxyzine  50 mg oral, 3 times daily as needed, anxiety -- Trazodone  50 mg, oral, daily at bedtime as needed, sleep             -- Haldol  BH Agitation Protocol (See MAR)              3. Medical Issues Being Addressed:           N/A    4. Labs  -- CBC: Unremarkable             -- CMP: Creatinine 1.30; Calcium 8.5; Total protein 5.5; Albumin 3.2, otherwise WNL             -- Ethanol: <15             -- Lipid Panel: LDL cholesterol 119,  otherwise WNL             -- HIV: Non Reactive             -- HgBA1c: WNL             -- Magnesium : WNL             -- Prolactin: In process             -- RPR: Non Reactive             -- TSH: WNL             -- UDS: Active              -- UA: Straw urine color, otherwise WNL              -- EKG: QT/QTc 378/449    -- The risks/benefits/side-effects/alternatives to this medication were discussed in detail with the patient and time was given for questions. The patient consents to medication trial.  -- FDA -- Metabolic profile and EKG monitoring obtained while on an atypical antipsychotic (BMI: Lipid Panel: HbgA1c: QTc:)               -- Encouraged patient to participate in unit milieu and in scheduled group  therapies  -- Short Term Goals: Ability to identify changes in lifestyle to reduce recurrence of condition will improve, Ability to verbalize feelings will improve, Ability to disclose and discuss suicidal ideas, Ability to demonstrate self-control will improve, Ability to identify and develop effective coping behaviors will improve, Ability to maintain clinical measurements within normal limits will improve, Compliance with prescribed medications will improve, and Ability to identify triggers associated with substance abuse/mental health issues will improve             -- Long Term Goals: Improvement in symptoms so as ready for discharge   5. Discharge Planning:  -- Social work and case management to assist with discharge planning and identification of hospital follow-up needs prior to discharge -- Estimated LOS: 5-7 days -- Discharge Concerns: Need to establish a safety plan; Medication compliance and effectiveness -- Discharge Goals: Return home with outpatient referrals for mental health follow-up including medication management/psychotherapy      Physician Treatment Plan for Primary Diagnosis: Schizoaffective disorder (HCC) Long Term Goal(s): Improvement in symptoms so as ready for  discharge   Short Term Goals: Ability to identify changes in lifestyle to reduce recurrence of condition will improve and Ability to identify triggers associated with substance abuse/mental health issues will improve   I certify that inpatient services furnished can reasonably be expected to improve the patient's condition.    Ellouise JAYSON Azure, FNP 06/26/2024, 2:29 PM Patient ID: William Romero, male   DOB: 1981-02-12, 43 y.o.   MRN: [id number]

## 2024-06-26 NOTE — Group Note (Signed)
 Recreation Therapy Group Note   Group Topic:Self-Esteem  Group Date: 06/26/2024 Start Time: 1042 End Time: 1115 Facilitators: Remmy Riffe-McCall, LRT,CTRS Location: [address] Dayroom   Group Topic: Self-esteem  Goal Area(s) Addresses:  Patient will identify and write at least one positive trait about themself. Patient will successfully identify important people, dates and mottos that is important to them. Patient will acknowledge the benefit of healthy self-esteem. Patient will endorse understanding of ways to increase self-esteem.   Behavioral Response:    Intervention: Personalized Plate- printed license plate template, markers or colored pencils   Activity: LRT began group session with open dialogue asking the patients to define self-esteem and verbally identify positive qualities and traits people may possess. Patients were then instructed to design a personalized license plate, with words and drawings, representing at least 3 positive things about themselves. Pts were encouraged to include favorites, things they are proud of, what they enjoy doing, and dreams for their future. If a patient had a life motto or a meaningful phase that expressed their life values, pt's were asked to incorporate that into their design as well. Patients were given the opportunity to share their completed work with the group.   Education: Healthy self-esteem, Positive character traits, Accepting compliments, Leisure as competence and coping, Support Systems, Discharge planning; LRT educated patients on the importance of healthy self-esteem and ways to build self-esteem. LRT addressed discharge planning reviewing positive coping skills and healthy support systems.  Education Outcome: Acknowledges education/In group clarification offered   Affect/Mood: N/A   Participation Level: Did not attend    Clinical Observations/Individualized Feedback:      Plan: Continue to engage patient in RT group  sessions 2-3x/week.   William Romero, LRT,CTRS 06/26/2024 3:40 PM

## 2024-06-26 NOTE — Progress Notes (Signed)
 Pt c/o agitation, requested benadryl  and haldol , medications given, patient denies any other need, no acute distress noted. Patient returned to his room.

## 2024-06-26 NOTE — Plan of Care (Signed)
  Problem: Education: Goal: Knowledge of [place] [facility] Education information/materials will improve Outcome: Progressing Goal: Verbalization of understanding the information provided will improve Outcome: Progressing   Problem: Activity: Goal: Sleeping patterns will improve Outcome: Progressing   Problem: Coping: Goal: Ability to verbalize frustrations and anger appropriately will improve Outcome: Progressing

## 2024-06-27 NOTE — Plan of Care (Signed)
   Problem: Education: Goal: Emotional status will improve Outcome: Progressing Goal: Mental status will improve Outcome: Progressing Goal: Verbalization of understanding the information provided will improve Outcome: Progressing

## 2024-06-27 NOTE — Progress Notes (Signed)
 [facility] MD Progress Note  06/27/2024 2:15 PM William Romero  MRN:  [id number]  Principal Problem: Schizoaffective disorder [facility]) Diagnosis: Principal Problem:   Schizoaffective disorder (HCC)  Reason for Admission:  William Romero is a 43 year old male with a past psychiatric history of schizophrenia, reported ADHD, GAD and episodes of depression who presented to [facility] on 06/24/24 with acute worsening psychosis.   24-hour Chart Review: Chart reviewed. Patient's case discussed in interdisciplinary team meeting.  Vital signs reviewed without critical value. PRN Trazodone  required overnight. He slept a documented 15.5 hours.  He is adherent to taking psychotropic medication regimen. Nursing notes indicate patient acknowledges diminished auditory hallucinations; Denies command or belittling hallucinations.   Today's Assessment Notes: William Romero is seen and evaluated sitting up on a chair on the unit.  He presents alert, cooperative, calm, and oriented to person, time, place, and situation.  Reports appetite is good and he ate all breakfast and lunch. He describes his concentration and energy as adequate. He denies suicidal ideation, including passive thoughts of death or self-harm urges. He endorses auditory hallucinations of hearing minimal "demon voice," which is residual for patient. He denies visual hallucinations.  Patient continues on Haldol  tablet 10 mg p.o. twice daily for psychotic symptoms.  No EPS symptoms as cogwheel rigidity, stiffness, tongue or mouth movement observed.  He continues on Cogentin  for EPS prevention.  Plan is to discharge patient  Haldol  LAI q. monthly.  He also inquired about how long to stay in the hospital, stating that he needs to notify his employer.  Made patient aware that the team will continue to monitor patient's symptoms for improvement prior to discharge.  Total Time spent with patient: 45 minutes  Past  Psychiatric History:  Previous reported psychiatric diagnoses: MDD, ?ADHD, schizophrenia, GAD Current psychiatrist:  Previously seen by Dr. Prentice Espy 04/13/2023, Did not follow-up  Current therapist: none.   Current psychiatric medications: none. Psychiatric medication history/compliance: Per chart review, poor.  Full med trials include: adderall, bupropion, duloxetine , haloperidol , hydroxyzine  (ineffective), lorazepam , olanzapine , ziprasidone , paliperidone , risperidone ,   Psychiatric hospitalization(s): Multiple inpatient hospitalizations at [facility] 6/11-6/19/2024, 6/22-03/06/2023, 1/25-10/12/2023. Most recent stay was 5/10-5/20/2025  Psychotherapy history: None Neuromodulation history: none. History of suicide (obtained from HPI): Reported 1 attempted OD in distant past, presently denied History of homicide or aggression (obtained in HPI): none. Rehab History: Has previously lived at [facility] house   Past Medical History:  Past Medical History:  Diagnosis Date   Paranoid schizophrenia (HCC) 10/03/2023   Schizophrenia [facility])     Past Surgical History:  Procedure Laterality Date   APPENDECTOMY     Family History: History reviewed. No pertinent family history. Family Psychiatric  History: See H&P  Social History:  Social History   Substance and Sexual Activity  Alcohol Use Not Currently   Alcohol/week: 5.0 standard drinks of alcohol   Types: 5 Cans of beer per week     Social History   Substance and Sexual Activity  Drug Use No    Social History   Socioeconomic History   Marital status: Single    Spouse name: Not on file   Number of children: Not on file   Years of education: Not on file   Highest education level: Not on file  Occupational History   Not on file  Tobacco Use   Smoking status: Former    Current packs/day: 0.50    Types: Cigarettes   Smokeless tobacco: Never  Vaping  Use   Vaping status: Never Used  Substance and Sexual Activity   Alcohol use: Not  Currently    Alcohol/week: 5.0 standard drinks of alcohol    Types: 5 Cans of beer per week   Drug use: No   Sexual activity: Not Currently  Other Topics Concern   Not on file  Social History Narrative   Reports new onset CAH at 43 years old, denies prior hx   Social Drivers of Corporate investment banker Strain: Not on file  Food Insecurity: No Food Insecurity (06/23/2024)   Hunger Vital Sign    Worried About Running Out of Food in the Last Year: Never true    Ran Out of Food in the Last Year: Never true  Transportation Needs: No Transportation Needs (06/23/2024)   PRAPARE - Administrator, Civil Service (Medical): No    Lack of Transportation (Non-Medical): No  Physical Activity: Not on file  Stress: Not on file  Social Connections: Unknown (06/23/2024)   Social Connection and Isolation Panel    Frequency of Communication with Friends and Family: Not on file    Frequency of Social Gatherings with Friends and Family: Never    Attends Religious Services: Never    Database administrator or Organizations: No    Attends Engineer, structural: Never    Marital Status: Patient declined   Additional Social History:     Current Medications: Current Facility-Administered Medications  Medication Dose Route Frequency Provider Last Rate Last Admin   acetaminophen  (TYLENOL ) tablet 650 mg  650 mg Oral Q6H PRN Tex Drilling, NP       alum & mag hydroxide-simeth (MAALOX/MYLANTA) 200-200-20 MG/5ML suspension 30 mL  30 mL Oral Q4H PRN Tex Drilling, NP       benztropine  (COGENTIN ) tablet 0.5 mg  0.5 mg Oral BID Tex Drilling, NP   0.5 mg at 06/27/24 0831   haloperidol  (HALDOL ) tablet 5 mg  5 mg Oral TID PRN Tex Drilling, NP   5 mg at 06/27/24 1049   And   diphenhydrAMINE  (BENADRYL ) capsule 50 mg  50 mg Oral TID PRN Tex Drilling, NP   50 mg at 06/27/24 1049   haloperidol  lactate (HALDOL ) injection 5 mg  5 mg Intramuscular TID PRN Tex Drilling, NP       And    diphenhydrAMINE  (BENADRYL ) injection 50 mg  50 mg Intramuscular TID PRN Tex Drilling, NP       And   LORazepam  (ATIVAN ) injection 2 mg  2 mg Intramuscular TID PRN Tex Drilling, NP       haloperidol  lactate (HALDOL ) injection 10 mg  10 mg Intramuscular TID PRN Tex Drilling, NP       And   diphenhydrAMINE  (BENADRYL ) injection 50 mg  50 mg Intramuscular TID PRN Tex Drilling, NP       And   LORazepam  (ATIVAN ) injection 2 mg  2 mg Intramuscular TID PRN Tex Drilling, NP       haloperidol  (HALDOL ) tablet 10 mg  10 mg Oral BID Jacelyn Cuen C, FNP   10 mg at 06/27/24 0831   hydrOXYzine  (ATARAX ) tablet 50 mg  50 mg Oral TID PRN Bennett, Christal H, NP   50 mg at 06/26/24 2029   magnesium  hydroxide (MILK OF MAGNESIA) suspension 30 mL  30 mL Oral Daily PRN Tex Drilling, NP       traZODone  (DESYREL ) tablet 50 mg  50 mg Oral QHS PRN Tex Drilling, NP  50 mg at 06/26/24 2029   Lab Results:  No results found for this or any previous visit (from the past 48 hours).  Blood Alcohol level:  Lab Results  Component Value Date   ETH <15 06/23/2024   ETH 23 (H) 02/02/2024   Metabolic Disorder Labs: Lab Results  Component Value Date   HGBA1C 4.8 06/23/2024   MPG 91.06 06/23/2024   MPG 103 01/15/2024   Lab Results  Component Value Date   PROLACTIN 15.6 06/23/2024   Lab Results  Component Value Date   CHOL 180 06/23/2024   TRIG 102 06/23/2024   HDL 41 06/23/2024   CHOLHDL 4.4 06/23/2024   VLDL 20 06/23/2024   LDLCALC 119 (H) 06/23/2024   LDLCALC 92 01/15/2024    Physical Findings: AIMS:  ,  ,0  ,  ,  ,  ,   CIWA:   N/A COWS:   N/A  Musculoskeletal: Strength & Muscle Tone: within normal limits Gait & Station: normal Patient leans: N/A  Psychiatric Specialty Exam:  Presentation  General Appearance:  Casual  Eye Contact: Good  Speech: Clear and Coherent  Speech Volume: Normal  Handedness: Right  Mood and Affect  Mood: Euthymic  Affect: Congruent  Thought  Process  Thought Processes: Coherent  Descriptions of Associations:Intact  Orientation:Full (Time, Place and Person)  Thought Content:Perseveration  History of Schizophrenia/Schizoaffective disorder:Yes  Duration of Psychotic Symptoms:Less than six months  Hallucinations:Hallucinations: Auditory Description of Auditory Hallucinations: Demons voices minimizing  Ideas of Reference:Paranoia  Suicidal Thoughts:Suicidal Thoughts: No  Homicidal Thoughts:Homicidal Thoughts: No  Sensorium  Memory: Immediate Fair; Recent Fair  Judgment: Fair  Insight: Shallow  Executive Functions  Concentration: Good  Attention Span: Good  Recall: Fair  Fund of Knowledge: Fair  Language: Good  Psychomotor Activity  Psychomotor Activity:Psychomotor Activity: Normal  Assets  Assets: Communication Skills; Desire for Improvement; Physical Health; Resilience  Sleep  Sleep:Sleep: Good Number of Hours of Sleep: 9.25  Physical Exam: Physical Exam Vitals and nursing note reviewed.  Constitutional:      General: He is not in acute distress.    Appearance: He is not ill-appearing.  HENT:     Head: Normocephalic.     Right Ear: External ear normal.     Left Ear: External ear normal.     Mouth/Throat:     Pharynx: Oropharynx is clear.  Eyes:     Extraocular Movements: Extraocular movements intact.  Cardiovascular:     Rate and Rhythm: Normal rate.     Pulses: Normal pulses.  Pulmonary:     Effort: Pulmonary effort is normal.  Abdominal:     Comments: Deferred   Genitourinary:    Comments: Deferred  Musculoskeletal:        General: Normal range of motion.     Cervical back: Normal range of motion.  Skin:    General: Skin is dry.  Neurological:     Mental Status: He is alert and oriented to person, place, and time.  Psychiatric:        Mood and Affect: Mood normal.        Behavior: Behavior normal.    Review of Systems  Constitutional:  Negative for chills and  fever.  HENT:  Negative for sore throat.   Eyes:  Negative for blurred vision.  Respiratory:  Negative for cough, sputum production, shortness of breath and wheezing.   Cardiovascular:  Negative for chest pain and palpitations.  Gastrointestinal:  Negative for abdominal pain, constipation, diarrhea, heartburn, nausea  and vomiting.  Genitourinary:  Negative for dysuria.  Musculoskeletal:  Negative for falls.  Skin:  Negative for itching and rash.  Neurological:  Negative for dizziness and headaches.  Endo/Heme/Allergies:        See allergy listing  Psychiatric/Behavioral:  Positive for depression and hallucinations. Negative for substance abuse and suicidal ideas. The patient is nervous/anxious. The patient does not have insomnia.    Blood pressure 98/82, pulse 81, temperature 98.3 F (36.8 C), temperature source Oral, resp. rate 18, height 6' (1.829 m), weight 102.1 kg, SpO2 97%. Body mass index is 30.52 kg/m.  Treatment Plan Summary: Daily contact with patient to assess and evaluate symptoms and progress in treatment and Medication management  06/27/2024: Patient appears less irritable with flat affect and fair eye contact. He appears guarded but cooperative with assessment. Reduced auditory and tactile hallucinations.  Continues on Haldol  10 mg p.o. twice daily, with plan to discharge patient Haldol  LAI q. monthly.  Nursing reported he received mild agitation medication. Continued inpatient hospitalization is recommended to monitor medication adjustment, assess tolerability, and ongoing stabilization.  PLAN: Safety and Monitoring:             -- Voluntary admission to inpatient psychiatric unit for safety, stabilization and treatment             -- Daily contact with patient to assess and evaluate symptoms and progress in treatment             -- Patient's case to be discussed in multi-disciplinary team meeting             -- Observation Level: q15 minute checks             -- Vital  signs:  q12 hours             -- Precautions: suicide, elopement, and assault   2. Psychiatric Diagnoses and Treatment:  # Schizophrenia   --Continue Haldol  10 mg p.o. twice daily for psychosis.  Plan is to discharge patient on Haldol  LAI q. monthly -- Discontinue Prolixin  to 10 mg 2 times daily, psychosis             -- Continue Cogentin  0.5 mg 2 times daily, EPS prevention   -- Continue hydroxyzine  50 mg oral, 3 times daily as needed, anxiety -- Trazodone  50 mg, oral, daily at bedtime as needed, sleep             -- Haldol  BH Agitation Protocol (See MAR)              3. Medical Issues Being Addressed:           N/A    4. Labs  -- CBC: Unremarkable             -- CMP: Creatinine 1.30; Calcium 8.5; Total protein 5.5; Albumin 3.2, otherwise WNL             -- Ethanol: <15             -- Lipid Panel: LDL cholesterol 119, otherwise WNL             -- HIV: Non Reactive             -- HgBA1c: WNL             -- Magnesium : WNL             -- Prolactin: In process             -- RPR:  Non Reactive             -- TSH: WNL             -- UDS: Active              -- UA: Straw urine color, otherwise WNL              -- EKG: QT/QTc 378/449    -- The risks/benefits/side-effects/alternatives to this medication were discussed in detail with the patient and time was given for questions. The patient consents to medication trial.  -- FDA -- Metabolic profile and EKG monitoring obtained while on an atypical antipsychotic (BMI: Lipid Panel: HbgA1c: QTc:)               -- Encouraged patient to participate in unit milieu and in scheduled group therapies  -- Short Term Goals: Ability to identify changes in lifestyle to reduce recurrence of condition will improve, Ability to verbalize feelings will improve, Ability to disclose and discuss suicidal ideas, Ability to demonstrate self-control will improve, Ability to identify and develop effective coping behaviors will improve, Ability to maintain clinical  measurements within normal limits will improve, Compliance with prescribed medications will improve, and Ability to identify triggers associated with substance abuse/mental health issues will improve             -- Long Term Goals: Improvement in symptoms so as ready for discharge   5. Discharge Planning:  -- Social work and case management to assist with discharge planning and identification of hospital follow-up needs prior to discharge -- Estimated LOS: 5-7 days -- Discharge Concerns: Need to establish a safety plan; Medication compliance and effectiveness -- Discharge Goals: Return home with outpatient referrals for mental health follow-up including medication management/psychotherapy      Physician Treatment Plan for Primary Diagnosis: Schizoaffective disorder (HCC) Long Term Goal(s): Improvement in symptoms so as ready for discharge   Short Term Goals: Ability to identify changes in lifestyle to reduce recurrence of condition will improve and Ability to identify triggers associated with substance abuse/mental health issues will improve   I certify that inpatient services furnished can reasonably be expected to improve the patient's condition.    William JAYSON Azure, FNP 06/27/2024, 2:15 PM Patient ID: William Romero, male   DOB: Jun 25, 1981, 43 y.o.   MRN: [id number] Patient ID: William Romero, male   DOB: [birth year], 43 y.o.   MRN: [id number]

## 2024-06-27 NOTE — Progress Notes (Signed)
   06/27/24 0820  Psych Admission Type (Psych Patients Only)  Admission Status Voluntary  Psychosocial Assessment  Patient Complaints Anxiety  Eye Contact Fair  Facial Expression Flat  Affect Preoccupied  Speech Logical/coherent  Interaction Guarded  Motor Activity Slow  Appearance/Hygiene Unremarkable  Behavior Characteristics Cooperative  Mood Apprehensive  Thought Process  Coherency WDL  Content Preoccupation  Delusions None reported or observed  Perception Hallucinations  Hallucination Auditory  Judgment Poor  Confusion None  Danger to Self  Current suicidal ideation? Denies  Agreement Not to Harm Self Yes  Description of Agreement verbal  Danger to Others  Danger to Others None reported or observed

## 2024-06-27 NOTE — Progress Notes (Signed)
(  Sleep Hours) - 8.25 (Any PRNs that were needed, meds refused, or side effects to meds)- Vistaril  for anxiety and Trazodone  for sleep at HS - Effective (Any disturbances and when (visitation, over night)- none (Concerns raised by the patient)- none (SI/HI/AVH)- AH

## 2024-06-27 NOTE — Progress Notes (Signed)
 Pt  c/o agitation and irritability, pt requested Benadryl  and haldol  to calm down. Meds given, patient denies any other needs at this time.

## 2024-06-27 NOTE — Group Note (Signed)
 Date:  06/27/2024 Time:  8:50 PM  Group Topic/Focus:  Wrap-Up Group:   The focus of this group is to help patients review their daily goal of treatment and discuss progress on daily workbooks.    Participation Level:  Did Not Attend   William Romero 06/27/2024, 8:50 PM

## 2024-06-27 NOTE — Group Note (Signed)
 Recreation Therapy Group Note   Group Topic:Team Building  Group Date: 06/27/2024 Start Time: 1002 End Time: 1030 Facilitators: Gal Smolinski-McCall, LRT,CTRS Location: [address] Day[address]   Group Topic: Communication, Team Building, Problem Solving  Goal Area(s) Addresses:  Patient will effectively work with peer towards shared goal.  Patient will identify skills used to make activity successful.  Patient will identify how skills used during activity can be applied to reach post d/c goals.   Behavioral Response:   Intervention: STEM Activity- Glass blower/designer  Activity: Tallest [employer]. In teams of 5-6, patients were given 11 craft pipe cleaners. Using the materials provided, patients were instructed to compete again the opposing team(s) to build the tallest free-standing structure from floor level. The activity was timed; difficulty increased by Clinical research associate as Production designer, theatre/television/film continued.  Systematically resources were removed with additional directions for example, placing one arm behind their back, working in silence, and shape stipulations. LRT facilitated post-activity discussion reviewing team processes and necessary communication skills involved in completion. Patients were encouraged to reflect how the skills utilized, or not utilized, in this activity can be incorporated to positively impact support systems post discharge.  Education: Pharmacist, community, Scientist, physiological, Discharge Planning   Education Outcome: Acknowledges education/In group clarification offered/Needs additional education.    Affect/Mood: N/A   Participation Level: Did not attend    Clinical Observations/Individualized Feedback:      Plan: Continue to engage patient in RT group sessions 2-3x/week.   Khaleef Ruby-McCall, LRT,CTRS 06/27/2024 11:10 AM

## 2024-06-27 NOTE — Plan of Care (Signed)
   Problem: Education: Goal: Knowledge of [facility] Education information/materials will improve Outcome: Progressing Goal: Verbalization of understanding the information provided will improve Outcome: Progressing   Problem: Activity: Goal: Interest or engagement in activities will improve Outcome: Progressing

## 2024-06-27 NOTE — Group Note (Signed)
 Date:  06/27/2024 Time:  9:36 AM  Group Topic/Focus:  Goals Group:   The focus of this group is to help patients establish daily goals to achieve during treatment and discuss how the patient can incorporate goal setting into their daily lives to aide in recovery.    Participation Level:  Did Not Attend  Participation Quality:  NA  Affect:  NA  Cognitive:  NA  Insight: NA  Engagement in Group:  NA  Modes of Intervention:  NA  Additional Comments:  Patient did not attend group  William Romero [name] 06/27/2024, 9:36 AM

## 2024-06-28 ENCOUNTER — Encounter (HOSPITAL_COMMUNITY): Payer: Self-pay

## 2024-06-28 NOTE — Group Note (Signed)
 Recreation Therapy Group Note   Group Topic:Leisure Education  Group Date: 06/28/2024 Start Time: 1037 End Time: 1055 Facilitators: Reality Dejonge-McCall, LRT,CTRS Location: [address] Dayroom   Group Topic: Leisure Education  Goal Area(s) Addresses:  Patient will identify positive leisure and recreation activities.  Patient will identify one positive benefit of participation in leisure activities.   Behavioral Response:   Intervention: Group Game  Activity: Lyrically Correct. Patients were divided in to 2 groups for game play. LRT used a deck of cards with various questions pertaining to the lyrics of songs. Some of the answers were given in multiple choice form while others were to be guesses. The team with the most right responses wins the game.    Education:  Teacher, English as a foreign language, Special educational needs teacher, Discharge Planning  Education Outcome: Acknowledges education/In group clarification offered/Needs additional education   Affect/Mood: N/A   Participation Level: Did not attend    Clinical Observations/Individualized Feedback:      Plan: Continue to engage patient in RT group sessions 2-3x/week.   Islay Polanco-McCall, LRT,CTRS 06/28/2024 1:39 PM

## 2024-06-28 NOTE — BH Assessment (Signed)
(  Sleep Hours) - 9.25 (Any PRNs that were needed, meds refused, or side effects to meds)-  (Any disturbances and when (visitation, over night)- None (Concerns raised by the patient)- Pt complained of R knee pain. (SI/HI/AVH)- Denies

## 2024-06-28 NOTE — Plan of Care (Signed)
   Problem: Education: Goal: Knowledge of [facility] Education information/materials will improve Outcome: Progressing Goal: Emotional status will improve Outcome: Progressing Goal: Mental status will improve Outcome: Progressing Goal: Verbalization of understanding the information provided will improve Outcome: Progressing

## 2024-06-28 NOTE — BH IP Treatment Plan (Signed)
 Interdisciplinary Treatment and Diagnostic Plan Update  06/28/2024 Time of Session: 12:30 PM - UPDATE William Romero MRN: [id number]  Principal Diagnosis: Schizoaffective disorder (HCC)  Secondary Diagnoses: Principal Problem:   Schizoaffective disorder (HCC)   Current Medications:  Current Facility-Administered Medications  Medication Dose Route Frequency Provider Last Rate Last Admin   acetaminophen  (TYLENOL ) tablet 650 mg  650 mg Oral Q6H PRN Tex Drilling, NP   650 mg at 06/27/24 2305   alum & mag hydroxide-simeth (MAALOX/MYLANTA) 200-200-20 MG/5ML suspension 30 mL  30 mL Oral Q4H PRN Tex Drilling, NP       benztropine  (COGENTIN ) tablet 0.5 mg  0.5 mg Oral BID Tex Drilling, NP   0.5 mg at 06/28/24 9096   haloperidol  (HALDOL ) tablet 5 mg  5 mg Oral TID PRN Tex Drilling, NP   5 mg at 06/27/24 1049   And   diphenhydrAMINE  (BENADRYL ) capsule 50 mg  50 mg Oral TID PRN Tex Drilling, NP   50 mg at 06/27/24 1049   haloperidol  lactate (HALDOL ) injection 5 mg  5 mg Intramuscular TID PRN Tex Drilling, NP       And   diphenhydrAMINE  (BENADRYL ) injection 50 mg  50 mg Intramuscular TID PRN Tex Drilling, NP       And   LORazepam  (ATIVAN ) injection 2 mg  2 mg Intramuscular TID PRN Tex Drilling, NP       haloperidol  lactate (HALDOL ) injection 10 mg  10 mg Intramuscular TID PRN Tex Drilling, NP       And   diphenhydrAMINE  (BENADRYL ) injection 50 mg  50 mg Intramuscular TID PRN Tex Drilling, NP       And   LORazepam  (ATIVAN ) injection 2 mg  2 mg Intramuscular TID PRN Tex Drilling, NP       haloperidol  (HALDOL ) tablet 10 mg  10 mg Oral BID Ntuen, Tina C, FNP   10 mg at 06/28/24 9096   hydrOXYzine  (ATARAX ) tablet 50 mg  50 mg Oral TID PRN Blair, Christal H, NP   50 mg at 06/28/24 9052   magnesium  hydroxide (MILK OF MAGNESIA) suspension 30 mL  30 mL Oral Daily PRN Tex Drilling, NP       traZODone  (DESYREL ) tablet 50 mg  50 mg Oral QHS PRN Tex Drilling, NP   50 mg at  06/27/24 2035   PTA Medications: Medications Prior to Admission  Medication Sig Dispense Refill Last Dose/Taking   VYVANSE 50 MG capsule Take 50 mg by mouth daily.       Patient Stressors: Other: CAH    Patient Strengths: Other: Not sure  Treatment Modalities: Medication Management, Group therapy, Case management,  1 to 1 session with clinician, Psychoeducation, Recreational therapy.   Physician Treatment Plan for Primary Diagnosis: Schizoaffective disorder (HCC) Long Term Goal(s): Improvement in symptoms so as ready for discharge   Short Term Goals: Ability to identify changes in lifestyle to reduce recurrence of condition will improve Ability to identify triggers associated with substance abuse/mental health issues will improve  Medication Management: Evaluate patient's response, side effects, and tolerance of medication regimen.  Therapeutic Interventions: 1 to 1 sessions, Unit Group sessions and Medication administration.  Evaluation of Outcomes: Progressing  Physician Treatment Plan for Secondary Diagnosis: Principal Problem:   Schizoaffective disorder (HCC)  Long Term Goal(s): Improvement in symptoms so as ready for discharge   Short Term Goals: Ability to identify changes in lifestyle to reduce recurrence of condition will improve Ability to identify triggers associated with substance abuse/mental  health issues will improve     Medication Management: Evaluate patient's response, side effects, and tolerance of medication regimen.  Therapeutic Interventions: 1 to 1 sessions, Unit Group sessions and Medication administration.  Evaluation of Outcomes: Progressing   RN Treatment Plan for Primary Diagnosis: Schizoaffective disorder (HCC) Long Term Goal(s): Knowledge of disease and therapeutic regimen to maintain health will improve  Short Term Goals: Ability to remain free from injury will improve, Ability to verbalize frustration and anger appropriately will improve,  Ability to verbalize feelings will improve, and Ability to disclose and discuss suicidal ideas  Medication Management: RN will administer medications as ordered by provider, will assess and evaluate patient's response and provide education to patient for prescribed medication. RN will report any adverse and/or side effects to prescribing provider.  Therapeutic Interventions: 1 on 1 counseling sessions, Psychoeducation, Medication administration, Evaluate responses to treatment, Monitor vital signs and CBGs as ordered, Perform/monitor CIWA, COWS, AIMS and Fall Risk screenings as ordered, Perform wound care treatments as ordered.  Evaluation of Outcomes: Progressing   [name] Treatment Plan for Primary Diagnosis: Schizoaffective disorder (HCC) Long Term Goal(s): Safe transition to appropriate next level of care at discharge, Engage patient in therapeutic group addressing interpersonal concerns.  Short Term Goals: Engage patient in aftercare planning with referrals and resources, Increase ability to appropriately verbalize feelings, Facilitate acceptance of mental health diagnosis and concerns, and Identify triggers associated with mental health/substance abuse issues  Therapeutic Interventions: Assess for all discharge needs, 1 to 1 time with Social worker, Explore available resources and support systems, Assess for adequacy in community support network, Educate family and significant other(s) on suicide prevention, Complete Psychosocial Assessment, Interpersonal group therapy.  Evaluation of Outcomes: Progressing   Progress in Treatment: Attending groups: No. Participating in groups: No. Taking medication as prescribed: Yes. Toleration medication: Yes. Family/Significant other contact made: No, will contact:  patient declined consents Patient understands diagnosis: Yes. Discussing patient identified problems/goals with staff: Yes. Medical problems stabilized or resolved: Yes. Denies  suicidal/homicidal ideation: Yes. Issues/concerns per patient self-inventory: No.   Patient Goals:  Nothing   Discharge Plan or Barriers: Patient likely to discharge back to home once stable.   Reason for Continuation of Hospitalization: Delusions  Medication stabilization   Estimated Length of Stay: 4 - 6 days  Last 3 [place] Suicide Severity Risk Score: Flowsheet Row [facility] (Current[facility] Most recent reading at 06/25/2024  7:55 PM ED from 06/23/2024 in [facility] Most recent reading at 06/23/2024 11:08 AM Clinical Support from 04/29/2024 in [facility] Most recent reading at 04/29/2024  8:49 AM  C-SSRS RISK CATEGORY No Risk No Risk No Risk    Last PHQ 2/9 Scores:    05/10/2024   11:50 AM 04/29/2024    8:47 AM 11/14/2023    9:48 AM  Depression screen PHQ 2/9  Decreased Interest 0 1 0  Down, Depressed, Hopeless 0 3 0  PHQ - 2 Score 0 4 0  Altered sleeping 3 2   Tired, decreased energy 3 0   Change in appetite 0 0   Feeling bad or failure about yourself  0 0   Trouble concentrating 3 3   Moving slowly or fidgety/restless 1 3   Suicidal thoughts 0 0   PHQ-9 Score 10 12   Difficult doing work/chores Extremely dIfficult Very difficult     Scribe for Treatment Team: Stacye Noori O Laynie Espy, LCSWA 06/28/2024 9:50 AM

## 2024-06-28 NOTE — Progress Notes (Addendum)
 Conversation with patient:  Patient stated that he lives with 4 roommates, but would prefer to be discharged to [facility]) and will take a bus to his home.  Patient said he doesn't have guns or weapons, and there are no guns or weapons in the home where he lives.  Patient said he doesn't use any substances, and doesn't need resources.  Patient said he prefers to go to [facility] for medication management and [facility] for therapy.     Mica Releford, LCSWA 06/27/2024

## 2024-06-28 NOTE — Plan of Care (Signed)
   Problem: Education: Goal: Emotional status will improve Outcome: Progressing Goal: Mental status will improve Outcome: Progressing Goal: Verbalization of understanding the information provided will improve Outcome: Progressing   Problem: Activity: Goal: Interest or engagement in activities will improve Outcome: Progressing Goal: Sleeping patterns will improve Outcome: Progressing

## 2024-06-28 NOTE — Group Note (Signed)
 Date:  06/28/2024 Time:  8:57 PM  Group Topic/Focus:  Wrap-Up Group:   The focus of this group is to help patients review their daily goal of treatment and discuss progress on daily workbooks.    Participation Level:  Did Not Attend  William Romero 06/28/2024, 8:57 PM

## 2024-06-28 NOTE — Progress Notes (Signed)
 [facility] MD Progress Note  06/28/2024 2:17 PM Masiah RAYNOR CALCATERRA  MRN:  [id number]  Principal Problem: Schizoaffective disorder [facility]) Diagnosis: Principal Problem:   Schizoaffective disorder (HCC)  Reason for Admission:  Labaron Digirolamo. Avel Ogawa is a 43 year old male with a past psychiatric history of schizophrenia, reported ADHD, GAD and episodes of depression who presented to [facility] on 06/24/24 with acute worsening psychosis.   24-hour Chart Review: Chart reviewed. Patient's case discussed in interdisciplinary team meeting.  Vital signs reviewed without critical value. PRN Trazodone  x 1 for sleep, Tylenol  x 1 for mild pain, and hydroxyzine  x 2 for anxiety required overnight. He slept a documented 9.25 hours.  He is adherent to taking psychotropic medication regimen. Nursing notes indicate patient acknowledges diminished auditory hallucinations; Denies command or belittling hallucinations.   Today's Assessment Notes: [name] presents alert, cooperative, calm, and oriented to person, time, place, and situation.  He unkempt with malodorous body odor.  Encouraged to take a shower today, patient states, I will. Reports appetite is good and he ate all breakfast and lunch. He describes his concentration and energy as adequate. He denies suicidal ideation, including passive thoughts of death or self-harm urges.  Reports auditory hallucination is minimal.  He denies visual hallucinations.  Patient continues on Haldol  tablet 10 mg p.o. twice daily for psychotic symptoms.  No EPS symptoms as cogwheel rigidity, stiffness, tongue or mouth movement observed.  He continues on Cogentin  for EPS prevention.  Plan is to discharge patient  Haldol  LAI q. monthly.  Continue to inquire about his discharge. Made patient aware that the team will continue to monitor patient's symptoms for improvement prior to discharge. Continued inpatient hospitalization is recommended to monitor  medication tolerability, and ongoing stabilization.   Total Time spent with patient: 45 minutes  Past Psychiatric History:  Previous reported psychiatric diagnoses: MDD, ?ADHD, schizophrenia, GAD Current psychiatrist:  Previously seen by Dr. Prentice Espy 04/13/2023, Did not follow-up  Current therapist: none.   Current psychiatric medications: none. Psychiatric medication history/compliance: Per chart review, poor.  Full med trials include: adderall, bupropion, duloxetine , haloperidol , hydroxyzine  (ineffective), lorazepam , olanzapine , ziprasidone , paliperidone , risperidone ,   Psychiatric hospitalization(s): Multiple inpatient hospitalizations at [facility] 6/11-6/19/2024, 6/22-03/06/2023, 1/25-10/12/2023. Most recent stay was 5/10-5/20/2025  Psychotherapy history: None Neuromodulation history: none. History of suicide (obtained from HPI): Reported 1 attempted OD in distant past, presently denied History of homicide or aggression (obtained in HPI): none. Rehab History: Has previously lived at [facility] house   Past Medical History:  Past Medical History:  Diagnosis Date   Paranoid schizophrenia (HCC) 10/03/2023   Schizophrenia [facility])     Past Surgical History:  Procedure Laterality Date   APPENDECTOMY     Family History: History reviewed. No pertinent family history. Family Psychiatric  History: See H&P  Social History:  Social History   Substance and Sexual Activity  Alcohol Use Not Currently   Alcohol/week: 5.0 standard drinks of alcohol   Types: 5 Cans of beer per week     Social History   Substance and Sexual Activity  Drug Use No    Social History   Socioeconomic History   Marital status: Single    Spouse name: Not on file   Number of children: Not on file   Years of education: Not on file   Highest education level: Not on file  Occupational History   Not on file  Tobacco Use   Smoking status: Former    Current packs/day: 0.50  Types: Cigarettes   Smokeless tobacco:  Never  Vaping Use   Vaping status: Never Used  Substance and Sexual Activity   Alcohol use: Not Currently    Alcohol/week: 5.0 standard drinks of alcohol    Types: 5 Cans of beer per week   Drug use: No   Sexual activity: Not Currently  Other Topics Concern   Not on file  Social History Narrative   Reports new onset CAH at 43 years old, denies prior hx   Social Drivers of Corporate investment banker Strain: Not on file  Food Insecurity: No Food Insecurity (06/23/2024)   Hunger Vital Sign    Worried About Running Out of Food in the Last Year: Never true    Ran Out of Food in the Last Year: Never true  Transportation Needs: No Transportation Needs (06/23/2024)   PRAPARE - Administrator, Civil Service (Medical): No    Lack of Transportation (Non-Medical): No  Physical Activity: Not on file  Stress: Not on file  Social Connections: Unknown (06/23/2024)   Social Connection and Isolation Panel    Frequency of Communication with Friends and Family: Not on file    Frequency of Social Gatherings with Friends and Family: Never    Attends Religious Services: Never    Database administrator or Organizations: No    Attends Engineer, structural: Never    Marital Status: Patient declined   Additional Social History:     Current Medications: Current Facility-Administered Medications  Medication Dose Route Frequency Provider Last Rate Last Admin   acetaminophen  (TYLENOL ) tablet 650 mg  650 mg Oral Q6H PRN Tex Drilling, NP   650 mg at 06/27/24 2305   alum & mag hydroxide-simeth (MAALOX/MYLANTA) 200-200-20 MG/5ML suspension 30 mL  30 mL Oral Q4H PRN Tex Drilling, NP       benztropine  (COGENTIN ) tablet 0.5 mg  0.5 mg Oral BID Tex Drilling, NP   0.5 mg at 06/28/24 9096   haloperidol  (HALDOL ) tablet 5 mg  5 mg Oral TID PRN Tex Drilling, NP   5 mg at 06/27/24 1049   And   diphenhydrAMINE  (BENADRYL ) capsule 50 mg  50 mg Oral TID PRN Tex Drilling, NP   50 mg at  06/27/24 1049   haloperidol  lactate (HALDOL ) injection 5 mg  5 mg Intramuscular TID PRN Tex Drilling, NP       And   diphenhydrAMINE  (BENADRYL ) injection 50 mg  50 mg Intramuscular TID PRN Tex Drilling, NP       And   LORazepam  (ATIVAN ) injection 2 mg  2 mg Intramuscular TID PRN Tex Drilling, NP       haloperidol  lactate (HALDOL ) injection 10 mg  10 mg Intramuscular TID PRN Tex Drilling, NP       And   diphenhydrAMINE  (BENADRYL ) injection 50 mg  50 mg Intramuscular TID PRN Tex Drilling, NP       And   LORazepam  (ATIVAN ) injection 2 mg  2 mg Intramuscular TID PRN Tex Drilling, NP       haloperidol  (HALDOL ) tablet 10 mg  10 mg Oral BID Harace Mccluney C, FNP   10 mg at 06/28/24 9096   hydrOXYzine  (ATARAX ) tablet 50 mg  50 mg Oral TID PRN Bennett, Christal H, NP   50 mg at 06/28/24 0947   magnesium  hydroxide (MILK OF MAGNESIA) suspension 30 mL  30 mL Oral Daily PRN Tex Drilling, NP       traZODone  (DESYREL ) tablet  50 mg  50 mg Oral QHS PRN Tex Drilling, NP   50 mg at 06/27/24 2035   Lab Results:  No results found for this or any previous visit (from the past 48 hours).  Blood Alcohol level:  Lab Results  Component Value Date   ETH <15 06/23/2024   ETH 23 (H) 02/02/2024   Metabolic Disorder Labs: Lab Results  Component Value Date   HGBA1C 4.8 06/23/2024   MPG 91.06 06/23/2024   MPG 103 01/15/2024   Lab Results  Component Value Date   PROLACTIN 15.6 06/23/2024   Lab Results  Component Value Date   CHOL 180 06/23/2024   TRIG 102 06/23/2024   HDL 41 06/23/2024   CHOLHDL 4.4 06/23/2024   VLDL 20 06/23/2024   LDLCALC 119 (H) 06/23/2024   LDLCALC 92 01/15/2024    Physical Findings: AIMS:  ,  ,0  ,  ,  ,  ,   CIWA:   N/A COWS:   N/A  Musculoskeletal: Strength & Muscle Tone: within normal limits Gait & Station: normal Patient leans: N/A  Psychiatric Specialty Exam:  Presentation  General Appearance:  Casual  Eye Contact: Good  Speech: Clear and  Coherent  Speech Volume: Normal  Handedness: Right  Mood and Affect  Mood: Euthymic  Affect: Congruent  Thought Process  Thought Processes: Coherent  Descriptions of Associations:Intact  Orientation:Full (Time, Place and Person)  Thought Content:Logical; Perseveration  History of Schizophrenia/Schizoaffective disorder:Yes  Duration of Psychotic Symptoms:Less than six months  Hallucinations:Hallucinations: Auditory Description of Auditory Hallucinations: minimizing  Ideas of Reference:Paranoia  Suicidal Thoughts:Suicidal Thoughts: No  Homicidal Thoughts:Homicidal Thoughts: No  Sensorium  Memory: Immediate Fair; Recent Fair  Judgment: Fair  Insight: Shallow  Executive Functions  Concentration: Good  Attention Span: Good  Recall: Fair  Fund of Knowledge: Fair  Language: Good  Psychomotor Activity  Psychomotor Activity:Psychomotor Activity: Normal  Assets  Assets: Communication Skills; Physical Health; Resilience  Sleep  Sleep:Sleep: Good Number of Hours of Sleep: 9.25  Physical Exam: Physical Exam Vitals and nursing note reviewed.  Constitutional:      General: He is not in acute distress.    Appearance: He is not ill-appearing.  HENT:     Head: Normocephalic.     Right Ear: External ear normal.     Left Ear: External ear normal.     Mouth/Throat:     Pharynx: Oropharynx is clear.  Eyes:     Extraocular Movements: Extraocular movements intact.  Cardiovascular:     Rate and Rhythm: Normal rate.     Pulses: Normal pulses.  Pulmonary:     Effort: Pulmonary effort is normal.  Abdominal:     Comments: Deferred   Genitourinary:    Comments: Deferred  Musculoskeletal:        General: Normal range of motion.     Cervical back: Normal range of motion.  Skin:    General: Skin is dry.  Neurological:     Mental Status: He is alert and oriented to person, place, and time.  Psychiatric:        Mood and Affect: Mood normal.         Behavior: Behavior normal.    Review of Systems  Constitutional:  Negative for chills and fever.  HENT:  Negative for sore throat.   Eyes:  Negative for blurred vision.  Respiratory:  Negative for cough, sputum production, shortness of breath and wheezing.   Cardiovascular:  Negative for chest pain and palpitations.  Gastrointestinal:  Negative for abdominal pain, constipation, diarrhea, heartburn, nausea and vomiting.  Genitourinary:  Negative for dysuria.  Musculoskeletal:  Negative for falls.  Skin:  Negative for itching and rash.  Neurological:  Negative for dizziness and headaches.  Endo/Heme/Allergies:        See allergy listing  Psychiatric/Behavioral:  Positive for depression and hallucinations. Negative for substance abuse and suicidal ideas. The patient is nervous/anxious. The patient does not have insomnia.    Blood pressure 100/78, pulse 72, temperature (!) 97.4 F (36.3 C), temperature source Oral, resp. rate 18, height 6' (1.829 m), weight 102.1 kg, SpO2 97%. Body mass index is 30.52 kg/m.  Treatment Plan Summary: Daily contact with patient to assess and evaluate symptoms and progress in treatment and Medication management  06/28/2024: Patient appears less irritable with flat affect and fair eye contact. He appears guarded but cooperative with assessment. Reduced auditory and tactile hallucinations.  Continues on Haldol  10 mg p.o. twice daily, with plan to discharge patient Haldol  LAI q. monthly.  Nursing reported he received as needed medication for anxiety. Continued inpatient hospitalization is recommended to monitor medication adjustment, assess tolerability, and ongoing stabilization.  PLAN: Safety and Monitoring:             -- Voluntary admission to inpatient psychiatric unit for safety, stabilization and treatment             -- Daily contact with patient to assess and evaluate symptoms and progress in treatment             -- Patient's case to be discussed in  multi-disciplinary team meeting             -- Observation Level: q15 minute checks             -- Vital signs:  q12 hours             -- Precautions: suicide, elopement, and assault   2. Psychiatric Diagnoses and Treatment:  # Schizophrenia   --Continue Haldol  10 mg p.o. twice daily for psychosis.  Plan is to discharge patient on Haldol  LAI q. monthly -- Discontinue Prolixin  to 10 mg 2 times daily, psychosis             -- Continue Cogentin  0.5 mg 2 times daily, EPS prevention   -- Continue hydroxyzine  50 mg oral, 3 times daily as needed, anxiety -- Trazodone  50 mg, oral, daily at bedtime as needed, sleep             -- Haldol  BH Agitation Protocol (See MAR)              3. Medical Issues Being Addressed:           N/A    4. Labs  -- CBC: Unremarkable             -- CMP: Creatinine 1.30; Calcium 8.5; Total protein 5.5; Albumin 3.2, otherwise WNL             -- Ethanol: <15             -- Lipid Panel: LDL cholesterol 119, otherwise WNL             -- HIV: Non Reactive             -- HgBA1c: WNL             -- Magnesium : WNL             -- Prolactin: In process             --  RPR: Non Reactive             -- TSH: WNL             -- UDS: Active              -- UA: Straw urine color, otherwise WNL              -- EKG: QT/QTc 378/449    -- The risks/benefits/side-effects/alternatives to this medication were discussed in detail with the patient and time was given for questions. The patient consents to medication trial.  -- FDA -- Metabolic profile and EKG monitoring obtained while on an atypical antipsychotic (BMI: Lipid Panel: HbgA1c: QTc:)               -- Encouraged patient to participate in unit milieu and in scheduled group therapies  -- Short Term Goals: Ability to identify changes in lifestyle to reduce recurrence of condition will improve, Ability to verbalize feelings will improve, Ability to disclose and discuss suicidal ideas, Ability to demonstrate self-control will  improve, Ability to identify and develop effective coping behaviors will improve, Ability to maintain clinical measurements within normal limits will improve, Compliance with prescribed medications will improve, and Ability to identify triggers associated with substance abuse/mental health issues will improve             -- Long Term Goals: Improvement in symptoms so as ready for discharge   5. Discharge Planning:  -- Social work and case management to assist with discharge planning and identification of hospital follow-up needs prior to discharge -- Estimated LOS: 5-7 days -- Discharge Concerns: Need to establish a safety plan; Medication compliance and effectiveness -- Discharge Goals: Return home with outpatient referrals for mental health follow-up including medication management/psychotherapy      Physician Treatment Plan for Primary Diagnosis: Schizoaffective disorder (HCC) Long Term Goal(s): Improvement in symptoms so as ready for discharge   Short Term Goals: Ability to identify changes in lifestyle to reduce recurrence of condition will improve and Ability to identify triggers associated with substance abuse/mental health issues will improve   I certify that inpatient services furnished can reasonably be expected to improve the patient's condition.    Ellouise JAYSON Azure, FNP 06/28/2024, 2:17 PM Patient ID: NADARA [name] MINERVA, male   DOB: 1981-07-22, 43 y.o.   MRN: [id number] Patient ID: MAKANA ROSTAD, male   DOB: August 05, 1981, 44 y.o.   MRN: [id number] Patient ID: JEYDAN BARNER, male   DOB: 03/02/1981, 43 y.o.   MRN: [id number]

## 2024-06-29 DIAGNOSIS — F259 Schizoaffective disorder, unspecified: Secondary | ICD-10-CM | POA: Diagnosis not present

## 2024-06-29 MED ORDER — HALOPERIDOL DECANOATE 100 MG/ML IM SOLN: 100.0000 mg | Freq: Once | INTRAMUSCULAR | Status: DC

## 2024-06-29 MED ORDER — HALOPERIDOL DECANOATE 100 MG/ML IM SOLN: 100.0000 mg | INTRAMUSCULAR | Status: DC

## 2024-06-29 MED ORDER — HALOPERIDOL DECANOATE 100 MG/ML IM SOLN
100.0000 mg | Freq: Once | INTRAMUSCULAR | Status: AC
  Administered 2024-06-29: 100 mg via INTRAMUSCULAR
  Filled 2024-06-29: qty 1

## 2024-06-29 MED ORDER — HALOPERIDOL DECANOATE 100 MG/ML IM SOLN: 200.0000 mg | INTRAMUSCULAR | Status: DC

## 2024-06-29 NOTE — Progress Notes (Signed)
(  Sleep Hours) - 10.5 (Any PRNs that were needed, meds refused, or side effects to meds)- atarax , trazodone  (Any disturbances and when (visitation, over night) (Concerns raised by the patient)- none (SI/HI/AVH)-denies

## 2024-06-29 NOTE — Group Note (Signed)
 Date:  06/29/2024 Time:  8:41 AM  Group Topic/Focus:  Goals Group:   The focus of this group is to help patients establish daily goals to achieve during treatment and discuss how the patient can incorporate goal setting into their daily lives to aide in recovery.    Participation Level:  Did Not Attend  Participation Quality:    Affect:    Cognitive:    Insight:   Engagement in Group:    Modes of Intervention:    Additional Comments:    Eleanor JAYSON Metro 06/29/2024, 8:41 AM

## 2024-06-29 NOTE — Progress Notes (Signed)
 [facility] MD Progress Note  06/29/2024 7:46 AM William Romero  MRN:  [id number]  Principal Problem: Schizoaffective disorder [facility]) Diagnosis: Principal Problem:   Schizoaffective disorder (HCC)  Reason for Admission:  William Romero is a 43 year old male with a past psychiatric history of schizophrenia, reported ADHD, GAD and episodes of depression who presented to [facility] on 06/24/24 with acute worsening psychosis.   24-hour Chart Review: Chart reviewed. Patient's case discussed in interdisciplinary team meeting.  Vital signs reviewed without critical value. PRN Trazodone  x 1 for sleep and hydroxyzine  x 2 for anxiety required overnight. He slept a documented 9.25 hours.  He is adherent to taking psychotropic medication regimen. Nursing notes indicate patient acknowledges diminished auditory hallucinations; Denies command or belittling hallucinations.   Today's Assessment Notes: William Romero presents alert, cooperative, calm, and oriented to person, time, place, and situation.  He appears to have showered, no longer malodorous. Reports appetite is good and he ate all breakfast and lunch. He describes his concentration and energy as adequate. He denies suicidal ideation, including passive thoughts of death or self-harm urges. Patient continues on Haldol  tablet 10 mg p.o. twice daily for psychotic symptoms.  No EPS symptoms as cogwheel rigidity, stiffness, tongue or mouth movement observed.  He continues on Cogentin  for EPS prevention.  Plan is to discharge patient  Haldol  LAI q. monthly.  Continue to inquire about his discharge. Made patient aware that the team will continue to monitor patient's symptoms for improvement prior to discharge. Continued inpatient hospitalization is recommended to monitor medication tolerability, and ongoing stabilization.   Reports: Sleep: fair Appetite: good Depression: good, not depressed Anxiety: still worried, but  controllable. Auditory Hallucinations: denies Visual Hallucinations: denies Paranoia: denies Delusions: denies SI: denies HI: denies   Total Time spent with patient: 45 minutes  Past Psychiatric History:  Previous reported psychiatric diagnoses: MDD, ?ADHD, schizophrenia, GAD Current psychiatrist:  Previously seen by Dr. Prentice Espy 04/13/2023, Did not follow-up  Current therapist: none.   Current psychiatric medications: none. Psychiatric medication history/compliance: Per chart review, poor.  Full med trials include: adderall, bupropion, duloxetine , haloperidol , hydroxyzine  (ineffective), lorazepam , olanzapine , ziprasidone , paliperidone , risperidone ,   Psychiatric hospitalization(s): Multiple inpatient hospitalizations at [facility] 6/11-6/19/2024, 6/22-03/06/2023, 1/25-10/12/2023. Most recent stay was 5/10-5/20/2025  Psychotherapy history: None Neuromodulation history: none. History of suicide (obtained from HPI): Reported 1 attempted OD in distant past, presently denied History of homicide or aggression (obtained in HPI): none. Rehab History: Has previously lived at [facility] house   Past Medical History:  Past Medical History:  Diagnosis Date   Paranoid schizophrenia (HCC) 10/03/2023   Schizophrenia [facility])     Past Surgical History:  Procedure Laterality Date   APPENDECTOMY     Family History: History reviewed. No pertinent family history. Family Psychiatric  History: See H&P  Social History:  Social History   Substance and Sexual Activity  Alcohol Use Not Currently   Alcohol/week: 5.0 standard drinks of alcohol   Types: 5 Cans of beer per week     Social History   Substance and Sexual Activity  Drug Use No    Social History   Socioeconomic History   Marital status: Single    Spouse name: Not on file   Number of children: Not on file   Years of education: Not on file   Highest education level: Not on file  Occupational History   Not on file  Tobacco Use   Smoking  status: Former  Current packs/day: 0.50    Types: Cigarettes   Smokeless tobacco: Never  Vaping Use   Vaping status: Never Used  Substance and Sexual Activity   Alcohol use: Not Currently    Alcohol/week: 5.0 standard drinks of alcohol    Types: 5 Cans of beer per week   Drug use: No   Sexual activity: Not Currently  Other Topics Concern   Not on file  Social History Narrative   Reports new onset CAH at 43 years old, denies prior hx   Social Drivers of Corporate Investment Banker Strain: Not on file  Food Insecurity: No Food Insecurity (06/23/2024)   Hunger Vital Sign    Worried About Running Out of Food in the Last Year: Never true    Ran Out of Food in the Last Year: Never true  Transportation Needs: No Transportation Needs (06/23/2024)   PRAPARE - Administrator, Civil Service (Medical): No    Lack of Transportation (Non-Medical): No  Physical Activity: Not on file  Stress: Not on file  Social Connections: Unknown (06/23/2024)   Social Connection and Isolation Panel    Frequency of Communication with Friends and Family: Not on file    Frequency of Social Gatherings with Friends and Family: Never    Attends Religious Services: Never    Database Administrator or Organizations: No    Attends Engineer, Structural: Never    Marital Status: Patient declined   Additional Social History:     Current Medications: Current Facility-Administered Medications  Medication Dose Route Frequency Provider Last Rate Last Admin   acetaminophen  (TYLENOL ) tablet 650 mg  650 mg Oral Q6H PRN Tex Drilling, NP   650 mg at 06/27/24 2305   alum & mag hydroxide-simeth (MAALOX/MYLANTA) 200-200-20 MG/5ML suspension 30 mL  30 mL Oral Q4H PRN Tex Drilling, NP       benztropine  (COGENTIN ) tablet 0.5 mg  0.5 mg Oral BID Tex Drilling, NP   0.5 mg at 06/28/24 1727   haloperidol  (HALDOL ) tablet 5 mg  5 mg Oral TID PRN Tex Drilling, NP   5 mg at 06/27/24 1049   And    diphenhydrAMINE  (BENADRYL ) capsule 50 mg  50 mg Oral TID PRN Tex Drilling, NP   50 mg at 06/27/24 1049   haloperidol  lactate (HALDOL ) injection 5 mg  5 mg Intramuscular TID PRN Tex Drilling, NP       And   diphenhydrAMINE  (BENADRYL ) injection 50 mg  50 mg Intramuscular TID PRN Tex Drilling, NP       And   LORazepam  (ATIVAN ) injection 2 mg  2 mg Intramuscular TID PRN Tex Drilling, NP       haloperidol  lactate (HALDOL ) injection 10 mg  10 mg Intramuscular TID PRN Tex Drilling, NP       And   diphenhydrAMINE  (BENADRYL ) injection 50 mg  50 mg Intramuscular TID PRN Tex Drilling, NP       And   LORazepam  (ATIVAN ) injection 2 mg  2 mg Intramuscular TID PRN Tex Drilling, NP       haloperidol  (HALDOL ) tablet 10 mg  10 mg Oral BID Ntuen, Tina C, FNP   10 mg at 06/28/24 1727   hydrOXYzine  (ATARAX ) tablet 50 mg  50 mg Oral TID PRN Bennett, Christal H, NP   50 mg at 06/28/24 2048   magnesium  hydroxide (MILK OF MAGNESIA) suspension 30 mL  30 mL Oral Daily PRN Tex Drilling, NP  traZODone  (DESYREL ) tablet 50 mg  50 mg Oral QHS PRN Tex Drilling, NP   50 mg at 06/28/24 2048   Lab Results:  No results found for this or any previous visit (from the past 48 hours).  Blood Alcohol level:  Lab Results  Component Value Date   ETH <15 06/23/2024   ETH 23 (H) 02/02/2024   Metabolic Disorder Labs: Lab Results  Component Value Date   HGBA1C 4.8 06/23/2024   MPG 91.06 06/23/2024   MPG 103 01/15/2024   Lab Results  Component Value Date   PROLACTIN 15.6 06/23/2024   Lab Results  Component Value Date   CHOL 180 06/23/2024   TRIG 102 06/23/2024   HDL 41 06/23/2024   CHOLHDL 4.4 06/23/2024   VLDL 20 06/23/2024   LDLCALC 119 (H) 06/23/2024   LDLCALC 92 01/15/2024    Physical Findings: AIMS:  ,  ,0  ,  ,  ,  ,   CIWA:   N/A COWS:   N/A  Musculoskeletal: Strength & Muscle Tone: within normal limits Gait & Station: normal Patient leans: N/A  Psychiatric Specialty  Exam: Mental Status Exam:  Appearance: Alert, caucasian male who appears approximately stated age.   Behavior: Good eye contact, no apparent internal stimuli  Attitude: Cooperative, polite  Speech:  WNL  Mood: I'm okay, feel like I am doing better  Affect: Depressed, restricted  Thought Process: WDL, no apparent disorganization.  Thought Content: denies paranoia, delusions  SI/HI: Denies  Perceptions: denies, not seen responding  Judgement: Fair  Insight: Fair  Fund of Knowledge: WNL     Sleep:Sleep: Good Number of Hours of Sleep: 9.25  Physical Exam: Physical Exam Vitals and nursing note reviewed.  Constitutional:      General: He is not in acute distress.    Appearance: He is not ill-appearing.  HENT:     Head: Normocephalic.     Right Ear: External ear normal.     Left Ear: External ear normal.     Mouth/Throat:     Pharynx: Oropharynx is clear.  Eyes:     Extraocular Movements: Extraocular movements intact.  Cardiovascular:     Rate and Rhythm: Normal rate.     Pulses: Normal pulses.  Pulmonary:     Effort: Pulmonary effort is normal.  Abdominal:     Comments: Deferred   Genitourinary:    Comments: Deferred  Musculoskeletal:        General: Normal range of motion.     Cervical back: Normal range of motion.  Skin:    General: Skin is dry.  Neurological:     Mental Status: He is alert and oriented to person, place, and time.  Psychiatric:        Mood and Affect: Mood normal.        Behavior: Behavior normal.    Review of Systems  Constitutional:  Negative for chills and fever.  HENT:  Negative for sore throat.   Eyes:  Negative for blurred vision.  Respiratory:  Negative for cough, sputum production, shortness of breath and wheezing.   Cardiovascular:  Negative for chest pain and palpitations.  Gastrointestinal:  Negative for abdominal pain, constipation, diarrhea, heartburn, nausea and vomiting.  Genitourinary:  Negative for dysuria.   Musculoskeletal:  Negative for falls.  Skin:  Negative for itching and rash.  Neurological:  Negative for dizziness and headaches.  Endo/Heme/Allergies:        See allergy listing  Psychiatric/Behavioral:  Positive for depression and hallucinations.  Negative for substance abuse and suicidal ideas. The patient is nervous/anxious. The patient does not have insomnia.    Blood pressure 107/81, pulse 72, temperature 97.7 F (36.5 C), temperature source Oral, resp. rate 18, height 6' (1.829 m), weight 102.1 kg, SpO2 98%. Body mass index is 30.52 kg/m.  Treatment Plan Summary: Daily contact with patient to assess and evaluate symptoms and progress in treatment and Medication management  Assessment: William Romero is a 43 year old male with a past psychiatric history of schizophrenia, reported ADHD, GAD and episodes of depression who presented to [facility] on 06/24/24 with acute worsening psychosis.  06/29/2024: Patient appears less irritable with restricted affect and good eye contact. He appears guarded but cooperative with assessment. Reports no hallucinations.  Continues on Haldol  10 mg p.o. twice daily, with plan to discharge patient Haldol  LAI q. monthly. Calculated dose is 200 mg monthly, but initial administration must be broken up into 100 mg increments to assess for tolerability. First dose today. Continued inpatient hospitalization is recommended to monitor medication adjustment, assess tolerability, and ongoing stabilization.  PLAN: Safety and Monitoring:             -- Voluntary admission to inpatient psychiatric unit for safety, stabilization and treatment             -- Daily contact with patient to assess and evaluate symptoms and progress in treatment             -- Patient's case to be discussed in multi-disciplinary team meeting             -- Observation Level: q15 minute checks             -- Vital signs:  q12 hours              -- Precautions: suicide, elopement, and assault   2. Psychiatric Diagnoses and Treatment:  # Schizophrenia   --D/C haloperidol  oral  -- Start haloperidol  LAI - 200 mg Q28 days  - Initial dose 100 mg today  - Second dose 100 mg on Monday 10/27             -- Continue Cogentin  0.5 mg 2 times daily, EPS prevention   -- Continue hydroxyzine  50 mg oral, 3 times daily as needed, anxiety -- Trazodone  50 mg, oral, daily at bedtime as needed, sleep             -- Haldol  BH Agitation Protocol (See MAR)              3. Medical Issues Being Addressed:           N/A    4. Labs  -- CBC: Unremarkable             -- CMP: Creatinine 1.30; Calcium 8.5; Total protein 5.5; Albumin 3.2, otherwise WNL             -- Ethanol: <15             -- Lipid Panel: LDL cholesterol 119, otherwise WNL             -- HIV: Non Reactive             -- HgBA1c: WNL             -- Magnesium : WNL             -- Prolactin: In process             --  RPR: Non Reactive             -- TSH: WNL             -- UDS: Active              -- UA: Straw urine color, otherwise WNL              -- EKG: QT/QTc 378/449    -- The risks/benefits/side-effects/alternatives to this medication were discussed in detail with the patient and time was given for questions. The patient consents to medication trial.  -- FDA -- Metabolic profile and EKG monitoring obtained while on an atypical antipsychotic (BMI: Lipid Panel: HbgA1c: QTc:)               -- Encouraged patient to participate in unit milieu and in scheduled group therapies  -- Short Term Goals: Ability to identify changes in lifestyle to reduce recurrence of condition will improve, Ability to verbalize feelings will improve, Ability to disclose and discuss suicidal ideas, Ability to demonstrate self-control will improve, Ability to identify and develop effective coping behaviors will improve, Ability to maintain clinical measurements within normal limits will improve, Compliance  with prescribed medications will improve, and Ability to identify triggers associated with substance abuse/mental health issues will improve             -- Long Term Goals: Improvement in symptoms so as ready for discharge   5. Discharge Planning:  -- Social work and case management to assist with discharge planning and identification of hospital follow-up needs prior to discharge -- Estimated LOS: 5-7 days -- Discharge Concerns: Need to establish a safety plan; Medication compliance and effectiveness -- Discharge Goals: Return home with outpatient referrals for mental health follow-up including medication management/psychotherapy      Physician Treatment Plan for Primary Diagnosis: Schizoaffective disorder (HCC) Long Term Goal(s): Improvement in symptoms so as ready for discharge   Short Term Goals: Ability to identify changes in lifestyle to reduce recurrence of condition will improve and Ability to identify triggers associated with substance abuse/mental health issues will improve   I certify that inpatient services furnished can reasonably be expected to improve the patient's condition.    William Morene Lavone Delsie, MD 06/29/2024, 7:46 AM  Patient ID: William Romero, male   DOB: Sep 11, 1980, 43 y.o.   MRN: [id number]

## 2024-06-29 NOTE — [facility] Group Notes (Signed)
 Patient attended social work group.

## 2024-06-29 NOTE — Plan of Care (Signed)
   Problem: Education: Goal: Knowledge of [facility] Education information/materials will improve Outcome: Progressing Goal: Emotional status will improve Outcome: Progressing Goal: Mental status will improve Outcome: Progressing Goal: Verbalization of understanding the information provided will improve Outcome: Progressing

## 2024-06-29 NOTE — Plan of Care (Signed)

## 2024-06-29 NOTE — Group Note (Deleted)
 [name] Group Therapy Note  Group Date: 06/29/2024 Start Time: 1000 End Time: 1100   Type of Therapy and Topic:  Group Therapy - Healthy vs Unhealthy Coping Skills  Participation Level:  {[facility] PARTICIPATION [id number]}   Description of Group The focus of this group was to determine what unhealthy coping techniques typically are used by group members and what healthy coping techniques would be helpful in coping with various problems. Patients were guided in becoming aware of the differences between healthy and unhealthy coping techniques. Patients were asked to identify 2-3 healthy coping skills they would like to learn to use more effectively.  Therapeutic Goals 1. Patients learned that coping is what human beings do all day long to deal with various situations in their lives 2. Patients defined and discussed healthy vs unhealthy coping techniques 3. Patients identified their preferred coping techniques and identified whether these were healthy or unhealthy 4. Patients determined 2-3 healthy coping skills they would like to become more familiar with and use more often. 5. Patients provided support and ideas to each other   Summary of Patient Progress:  During group, *** expressed ***. Patient proved open to input from peers and feedback from CSW. Patient demonstrated *** insight into the subject matter, was respectful of peers, and participated throughout the entire session.   Therapeutic Modalities Cognitive Behavioral Therapy Motivational Interviewing  Camelia Olden, LCSWA 06/29/2024  2:49 PM

## 2024-06-29 NOTE — Group Note (Signed)
 [name] Group Therapy Note  Group Date: 06/29/2024 Start Time: 1000 End Time: 1045   Type of Therapy and Topic:  Group Therapy - Self care skills  Participation Level:  Minimal   Description of Group The focus of this group was to determine what unhealthy coping techniques typically are used by group members and what healthy coping techniques would be helpful in coping with various problems. Patients were guided in becoming aware of the differences between healthy and unhealthy coping techniques. Patients were asked to identify 2-3 self care skills they would like to learn to use more effectively.  Therapeutic Goals Patients learned that coping is what human beings do all day long to deal with various situations in their lives Patients defined and discussed healthy vs unhealthy coping techniques Patients identified their preferred coping techniques and identified whether these were healthy or unhealthy Patients determined 2-3 self care skills they would like to become more familiar with and use more often. Patients provided support and ideas to each other   Summary of Patient Progress: The patient did not stay long, patient left after ice breaker was done   Therapeutic Modalities Cognitive Behavioral Therapy Motivational Interviewing  Sparkle Aube O Dayleen Beske, LCSWA 06/29/2024  2:46 PM

## 2024-06-29 NOTE — Progress Notes (Signed)
 Patient denies SI, AH, VH. Patient stated they slept good last night. Scored zero on anxiety and depression. Patient has been calm, cooperative, and med compliant.    Mercie DEL Yug Loria, RN 10:10 AM    06/29/24 0800  Psych Admission Type (Psych Patients Only)  Admission Status Voluntary  Psychosocial Assessment  Patient Complaints None  Eye Contact Fair  Facial Expression Flat  Affect Preoccupied  Speech Soft  Interaction Assertive  Motor Activity Other (Comment) (WDL)  Appearance/Hygiene Unremarkable  Behavior Characteristics Cooperative;Calm  Mood Preoccupied;Pleasant  Thought Process  Coherency WDL  Content Preoccupation  Delusions None reported or observed  Perception Hallucinations  Hallucination Auditory  Judgment Limited  Confusion None  Danger to Self  Current suicidal ideation? Denies  Agreement Not to Harm Self Yes  Description of Agreement Verbal  Danger to Others  Danger to Others None reported or observed

## 2024-06-29 NOTE — Group Note (Signed)
 Date:  06/29/2024 Time:  9:00 PM  Group Topic/Focus:  Wrap-Up Group:   The focus of this group is to help patients review their daily goal of treatment and discuss progress on daily workbooks.    Participation Level:  Did Not Attend  William Romero 06/29/2024, 9:00 PM

## 2024-06-30 NOTE — Progress Notes (Signed)
 [facility] MD Progress Note  06/30/2024 8:15 AM William Romero  MRN:  [id number]  Principal Problem: Schizoaffective disorder (HCC) Diagnosis: Principal Problem:   Schizoaffective disorder (HCC)  Reason for Admission:  William Romero is a 43 year old male with a past psychiatric history of schizophrenia, reported ADHD, GAD and episodes of depression who presented to [facility] on 06/24/24 with acute worsening psychosis.   24-hour Chart Review: Vitals WNL. Hydroxyzine  50 mg x1. Trazodone  50 mg x1. Slept 9 hours. No SI, HI, AVH.   Today's Assessment Notes: William Romero presents alert, cooperative, calm, and oriented to person, time, place, and situation.  He appears to have showered, no longer malodorous. Reports appetite is good and he ate all breakfast and lunch. He describes his concentration and energy as adequate. He denies suicidal ideation, including passive thoughts of death or self-harm urges. Patient continues on Haldol  tablet 10 mg p.o. twice daily for psychotic symptoms.  No EPS symptoms as cogwheel rigidity, stiffness, tongue or mouth movement observed.  He continues on Cogentin  for EPS prevention.  Plan is to discharge patient  Haldol  LAI q. monthly.  Continue to inquire about his discharge. Made patient aware that the team will continue to monitor patient's symptoms for improvement prior to discharge. Continued inpatient hospitalization is recommended to monitor medication tolerability, and ongoing stabilization.   Patient somewhat flat, however patient he is organized, attentive to interview, denying suicidal homicidal ideation.  Appears motivated to get on medications.  Understands plan is for Haldol  tomorrow.  Did not get the greatest sleep.  Denied medication side effects.  Patient lives near here.  Concerning patient's job, asked that the paperwork be sent today.  The patient has any logistical issues, he said that he will bring this up  with staff.   Reports: Sleep: fair Appetite: good Depression: good, not depressed Anxiety: still worried, but controllable. Auditory Hallucinations: denies Visual Hallucinations: denies Paranoia: denies Delusions: denies SI: denies HI: denies   Total Time spent with patient: 45 minutes  Past Psychiatric History:  Previous reported psychiatric diagnoses: MDD, ?ADHD, schizophrenia, GAD Current psychiatrist:  Previously seen by Dr. Prentice Espy 04/13/2023, Did not follow-up  Current therapist: none.   Current psychiatric medications: none. Psychiatric medication history/compliance: Per chart review, poor.  Full med trials include: adderall, bupropion, duloxetine , haloperidol , hydroxyzine  (ineffective), lorazepam , olanzapine , ziprasidone , paliperidone , risperidone ,   Psychiatric hospitalization(s): Multiple inpatient hospitalizations at [facility] 6/11-6/19/2024, 6/22-03/06/2023, 1/25-10/12/2023. Most recent stay was 5/10-5/20/2025  Psychotherapy history: None Neuromodulation history: none. History of suicide (obtained from HPI): Reported 1 attempted OD in distant past, presently denied History of homicide or aggression (obtained in HPI): none. Rehab History: Has previously lived at [facility] house   Past Medical History:  Past Medical History:  Diagnosis Date   Paranoid schizophrenia (HCC) 10/03/2023   [facility])     Past Surgical History:  Procedure Laterality Date   APPENDECTOMY     Family History: History reviewed. No pertinent family history. Family Psychiatric  History: See H&P  Social History:  Social History   Substance and Sexual Activity  Alcohol Use Not Currently   Alcohol/week: 5.0 standard drinks of alcohol   Types: 5 Cans of beer per week     Social History   Substance and Sexual Activity  Drug Use No    Social History   Socioeconomic History   Marital status: Single    Spouse name: Not on file   Number of children: Not on file  Years of education:  Not on file   Highest education level: Not on file  Occupational History   Not on file  Tobacco Use   Smoking status: Former    Current packs/day: 0.50    Types: Cigarettes   Smokeless tobacco: Never  Vaping Use   Vaping status: Never Used  Substance and Sexual Activity   Alcohol use: Not Currently    Alcohol/week: 5.0 standard drinks of alcohol    Types: 5 Cans of beer per week   Drug use: No   Sexual activity: Not Currently  Other Topics Concern   Not on file  Social History Narrative   Reports new onset CAH at 43 years old, denies prior hx   Social Drivers of Corporate Investment Banker Strain: Not on file  Food Insecurity: No Food Insecurity (06/23/2024)   Hunger Vital Sign    Worried About Running Out of Food in the Last Year: Never true    Ran Out of Food in the Last Year: Never true  Transportation Needs: No Transportation Needs (06/23/2024)   PRAPARE - Administrator, Civil Service (Medical): No    Lack of Transportation (Non-Medical): No  Physical Activity: Not on file  Stress: Not on file  Social Connections: Unknown (06/23/2024)   Social Connection and Isolation Panel    Frequency of Communication with Friends and Family: Not on file    Frequency of Social Gatherings with Friends and Family: Never    Attends Religious Services: Never    Database Administrator or Organizations: No    Attends Engineer, Structural: Never    Marital Status: Patient declined   Additional Social History:     Current Medications: Current Facility-Administered Medications  Medication Dose Route Frequency Provider Last Rate Last Admin   acetaminophen  (TYLENOL ) tablet 650 mg  650 mg Oral Q6H PRN Tex Drilling, NP   650 mg at 06/27/24 2305   alum & mag hydroxide-simeth (MAALOX/MYLANTA) 200-200-20 MG/5ML suspension 30 mL  30 mL Oral Q4H PRN Tex Drilling, NP       benztropine  (COGENTIN ) tablet 0.5 mg  0.5 mg Oral BID Tex Drilling, NP   0.5 mg at 06/29/24 1644    haloperidol  (HALDOL ) tablet 5 mg  5 mg Oral TID PRN Tex Drilling, NP   5 mg at 06/27/24 1049   And   diphenhydrAMINE  (BENADRYL ) capsule 50 mg  50 mg Oral TID PRN Tex Drilling, NP   50 mg at 06/27/24 1049   haloperidol  lactate (HALDOL ) injection 5 mg  5 mg Intramuscular TID PRN Tex Drilling, NP       And   diphenhydrAMINE  (BENADRYL ) injection 50 mg  50 mg Intramuscular TID PRN Tex Drilling, NP       And   LORazepam  (ATIVAN ) injection 2 mg  2 mg Intramuscular TID PRN Tex Drilling, NP       haloperidol  lactate (HALDOL ) injection 10 mg  10 mg Intramuscular TID PRN Tex Drilling, NP       And   diphenhydrAMINE  (BENADRYL ) injection 50 mg  50 mg Intramuscular TID PRN Tex Drilling, NP       And   LORazepam  (ATIVAN ) injection 2 mg  2 mg Intramuscular TID PRN Tex Drilling, NP       [START ON 07/02/2024] haloperidol  decanoate (HALDOL  DECANOATE) 100 MG/ML injection 100 mg  100 mg Intramuscular Once Pashayan, Alexander S, DO       Followed by   NOREEN ON 07/29/2024]  haloperidol  decanoate (HALDOL  DECANOATE) 100 MG/ML injection 200 mg  200 mg Intramuscular Q28 days Pashayan, Alexander S, DO       hydrOXYzine  (ATARAX ) tablet 50 mg  50 mg Oral TID PRN Blair, Christal H, NP   50 mg at 06/29/24 2003   magnesium  hydroxide (MILK OF MAGNESIA) suspension 30 mL  30 mL Oral Daily PRN Tex Drilling, NP       traZODone  (DESYREL ) tablet 50 mg  50 mg Oral QHS PRN Tex Drilling, NP   50 mg at 06/29/24 2003   Lab Results:  No results found for this or any previous visit (from the past 48 hours).  Blood Alcohol level:  Lab Results  Component Value Date   ETH <15 06/23/2024   ETH 23 (H) 02/02/2024   Metabolic Disorder Labs: Lab Results  Component Value Date   HGBA1C 4.8 06/23/2024   MPG 91.06 06/23/2024   MPG 103 01/15/2024   Lab Results  Component Value Date   PROLACTIN 15.6 06/23/2024   Lab Results  Component Value Date   CHOL 180 06/23/2024   TRIG 102 06/23/2024   HDL 41  06/23/2024   CHOLHDL 4.4 06/23/2024   VLDL 20 06/23/2024   LDLCALC 119 (H) 06/23/2024   LDLCALC 92 01/15/2024    Physical Findings: AIMS:  ,  ,0  ,  ,  ,  ,   CIWA:   N/A COWS:   N/A  Musculoskeletal: Strength & Muscle Tone: within normal limits Gait & Station: normal Patient leans: N/A  Psychiatric Specialty Exam: Mental Status Exam:  Appearance: Alert, caucasian male who appears approximately stated age.   Behavior: Good eye contact, no apparent internal stimuli  Attitude: Cooperative, polite  Speech:  WNL  Mood: Pretty good  Affect: Depressed, restricted  Thought Process: WDL, no apparent disorganization.  Thought Content: denies paranoia, delusions  SI/HI: Denies  Perceptions: denies, not seen responding  Judgement: Fair  Insight: Fair  Fund of Knowledge: WNL     Sleep:No data recorded  Physical Exam: Physical Exam Vitals and nursing note reviewed.  Constitutional:      General: He is not in acute distress.    Appearance: He is not ill-appearing.  HENT:     Head: Normocephalic.     Right Ear: External ear normal.     Left Ear: External ear normal.     Mouth/Throat:     Pharynx: Oropharynx is clear.  Eyes:     Extraocular Movements: Extraocular movements intact.  Cardiovascular:     Rate and Rhythm: Normal rate.     Pulses: Normal pulses.  Pulmonary:     Effort: Pulmonary effort is normal.  Abdominal:     Comments: Deferred   Genitourinary:    Comments: Deferred  Musculoskeletal:        General: Normal range of motion.     Cervical back: Normal range of motion.  Skin:    General: Skin is dry.  Neurological:     Mental Status: He is alert and oriented to person, place, and time.  Psychiatric:        Mood and Affect: Mood normal.        Behavior: Behavior normal.    Review of Systems  Constitutional:  Negative for chills and fever.  HENT:  Negative for sore throat.   Eyes:  Negative for blurred vision.  Respiratory:  Negative for cough,  sputum production, shortness of breath and wheezing.   Cardiovascular:  Negative for chest pain and palpitations.  Gastrointestinal:  Negative for abdominal pain, constipation, diarrhea, heartburn, nausea and vomiting.  Genitourinary:  Negative for dysuria.  Musculoskeletal:  Negative for falls.  Skin:  Negative for itching and rash.  Neurological:  Negative for dizziness and headaches.  Endo/Heme/Allergies:        See allergy listing  Psychiatric/Behavioral:  Positive for depression and hallucinations. Negative for substance abuse and suicidal ideas. The patient is nervous/anxious. The patient does not have insomnia.    Blood pressure 116/78, pulse 85, temperature 98.2 F (36.8 C), temperature source Oral, resp. rate 18, height 6' (1.829 m), weight 102.1 kg, SpO2 97%. Body mass index is 30.52 kg/m.  Treatment Plan Summary: Daily contact with patient to assess and evaluate symptoms and progress in treatment and Medication management  Assessment: William Romero is a 43 year old male with a past psychiatric history of schizophrenia, reported ADHD, GAD and episodes of depression who presented to [facility] on 06/24/24 with acute worsening psychosis.  06/29/2024: Patient appears less irritable with restricted affect and good eye contact. He appears guarded but cooperative with assessment. Reports no hallucinations.  Continues on Haldol  10 mg p.o. twice daily, with plan to discharge patient Haldol  LAI q. monthly. Calculated dose is 200 mg monthly, but initial administration must be broken up into 100 mg increments to assess for tolerability. First dose today. Continued inpatient hospitalization is recommended to monitor medication adjustment, assess tolerability, and ongoing stabilization.  06/30/2024: Patient showed no signs of irritability, although remains somewhat flat.  No medication side effects.  Amenable to Haldol  LAI tomorrow.   Appropriate for discharge likely Tuesday.  PLAN: Safety and Monitoring:             -- Voluntary admission to inpatient psychiatric unit for safety, stabilization and treatment             -- Daily contact with patient to assess and evaluate symptoms and progress in treatment             -- Patient's case to be discussed in multi-disciplinary team meeting             -- Observation Level: q15 minute checks             -- Vital signs:  q12 hours             -- Precautions: suicide, elopement, and assault   2. Psychiatric Diagnoses and Treatment:  # Schizophrenia   -- Start haloperidol  LAI - 200 mg Q28 days  - Initial dose 100 mg today  - Second dose 100 mg on Monday 10/27             -- Continue Cogentin  0.5 mg 2 times daily, EPS prevention   -- Continue hydroxyzine  50 mg oral, 3 times daily as needed, anxiety -- Trazodone  50 mg, oral, daily at bedtime as needed, sleep             -- Haldol  BH Agitation Protocol (See MAR)              3. Medical Issues Being Addressed:           N/A    4. Labs  -- CBC: Unremarkable             -- CMP: Creatinine 1.30; Calcium 8.5; Total protein 5.5; Albumin 3.2, otherwise WNL             -- Ethanol: <15             --  Lipid Panel: LDL cholesterol 119, otherwise WNL             -- HIV: Non Reactive             -- HgBA1c: WNL             -- Magnesium : WNL             -- Prolactin: In process             -- RPR: Non Reactive             -- TSH: WNL             -- UDS: Active              -- UA: Straw urine color, otherwise WNL              -- EKG: QT/QTc 378/449    -- The risks/benefits/side-effects/alternatives to this medication were discussed in detail with the patient and time was given for questions. The patient consents to medication trial.  -- FDA -- Metabolic profile and EKG monitoring obtained while on an atypical antipsychotic (BMI: Lipid Panel: HbgA1c: QTc:)               -- Encouraged patient to participate in unit milieu and in  scheduled group therapies  -- Short Term Goals: Ability to identify changes in lifestyle to reduce recurrence of condition will improve, Ability to verbalize feelings will improve, Ability to disclose and discuss suicidal ideas, Ability to demonstrate self-control will improve, Ability to identify and develop effective coping behaviors will improve, Ability to maintain clinical measurements within normal limits will improve, Compliance with prescribed medications will improve, and Ability to identify triggers associated with substance abuse/mental health issues will improve             -- Long Term Goals: Improvement in symptoms so as ready for discharge   5. Discharge Planning:  -- Social work and case management to assist with discharge planning and identification of hospital follow-up needs prior to discharge -- Estimated LOS: 2-3 days -- Discharge Concerns: Need to establish a safety plan; Medication compliance and effectiveness -- Discharge Goals: Return home with outpatient referrals for mental health follow-up including medication management/psychotherapy      Physician Treatment Plan for Primary Diagnosis: Schizoaffective disorder (HCC) Long Term Goal(s): Improvement in symptoms so as ready for discharge   Short Term Goals: Ability to identify changes in lifestyle to reduce recurrence of condition will improve and Ability to identify triggers associated with substance abuse/mental health issues will improve   I certify that inpatient services furnished can reasonably be expected to improve the patient's condition.    William Causey, MD 06/30/2024, 8:15 AM  Patient ID: William Romero, male   DOB: May 25, 1981, 43 y.o.   MRN: [id number]

## 2024-06-30 NOTE — Plan of Care (Addendum)
 Pt's affect remains flat with fair eye contact, logical speech. Denies SI, HI, AVH and pain Not today, I'm fine. Reports he slept well with fair appetite I just need my other shot so I can go. Observed to be more engaged in activities on and off unit this shift.  Tolerates meals, medications and fluids well. Safety maintained at Q 15 minutes intervals without issues.   Problem: Activity: Goal: Interest or engagement in activities will improve Outcome: Progressing   Problem: Health Behavior/Discharge Planning: Goal: Compliance with treatment plan for underlying cause of condition will improve Outcome: Progressing   Problem: Safety: Goal: Periods of time without injury will increase Outcome: Progressing

## 2024-06-30 NOTE — Group Note (Signed)
 Date:  06/30/2024 Time:  8:37 PM  Group Topic/Focus:  Wrap-Up Group:   The focus of this group is to help patients review their daily goal of treatment and discuss progress on daily workbooks.    Participation Level:  Did Not Attend  Jeanna Giuffre Dacosta 06/30/2024, 8:37 PM

## 2024-06-30 NOTE — Plan of Care (Signed)

## 2024-06-30 NOTE — [facility] Group Notes (Signed)
 Adult Psychoeducational Group Note  Date:  06/30/2024 Time:  10:26 AM  Group Topic/Focus:  Goals Group:   The focus of this group is to help patients establish daily goals to achieve during treatment and discuss how the patient can incorporate goal setting into their daily lives to aide in recovery.  Participation Level:  Active  Participation Quality:  Appropriate  Affect:  Appropriate  Cognitive:  Alert  Insight: Appropriate  Engagement in Group:  Engaged  Modes of Intervention:  Orientation  Additional Comments:    Annett Redman Ramsay 06/30/2024, 10:26 AM

## 2024-06-30 NOTE — Progress Notes (Signed)
(  Sleep Hours) -9 (Any PRNs that were needed, meds refused, or side effects to meds)- Trazodone , and Atarax  (Any disturbances and when (visitation, over night)-none (Concerns raised by the patient)- none (SI/HI/AVH)-denied

## 2024-07-01 DIAGNOSIS — F259 Schizoaffective disorder, unspecified: Secondary | ICD-10-CM | POA: Diagnosis not present

## 2024-07-01 MED ORDER — HALOPERIDOL DECANOATE 100 MG/ML IM SOLN
100.0000 mg | Freq: Once | INTRAMUSCULAR | Status: AC
  Administered 2024-07-01: 100 mg via INTRAMUSCULAR
  Filled 2024-07-01: qty 1

## 2024-07-01 MED ORDER — HALOPERIDOL DECANOATE 100 MG/ML IM SOLN: 200.0000 mg | INTRAMUSCULAR | Status: DC

## 2024-07-01 NOTE — Plan of Care (Signed)

## 2024-07-01 NOTE — Progress Notes (Signed)
(  Sleep Hours) -8.5 (Any PRNs that were needed, meds refused, or side effects to meds)- Trazodone , and Atarax  (Any disturbances and when (visitation, over night)-none (Concerns raised by the patient)- none (SI/HI/AVH)-denied

## 2024-07-01 NOTE — Progress Notes (Signed)
 Tour of Duty:  Prentice JINNY Angle, RN, 07/01/24, Tour of Duty: 0700-1900  SI/HI/AVH: William Romero. Denies SI/HI/VH  Self-Reported   Mood: Positive  Anxiety: Endorses Depression: Denies Irritability: Denies  Broset  Violence Prevention Guidelines *See Row Information*: Small Violence Risk interventions implemented   LBM  Last BM Date : 06/30/24   Pain: present, PRN provided (see MAR)  Patient Refusals (including Rx): No  >>Shift Summary: Patient observed to be calm on unit. Patient able to make needs known. Patient preoccupied with discharge and language physician used to describe acute schizophrenia. Patient complaint with LAI injection today, tolerated well. Patient observed to engage appropriately with staff and peers. Patient taking medications as prescribed. This shift, no PRN medication requested or required. No reported or observed side effects to medication. No reported or observed agitation, aggression, or other acute emotional distress. Patient reports foot pain, PRN administered. No additional reported or observed physical abnormalities or concerns.  Last Vitals  Vitals Weight: 102.1 kg Temp: 97.9 F (36.6 C) Temp Source: Oral Pulse Rate: 72 Resp: 20 BP: 113/72 Patient Position: (not recorded)  Admission Type  Psych Admission Type (Psych Patients Only) Admission Status: Voluntary Date 72 hour document signed : (not recorded) Time 72 hour document signed : (not recorded) Provider Notified (First and Last Name) (see details for LINK to note): (not recorded)   Psychosocial Assessment  Psychosocial Assessment Patient Complaints: None Eye Contact: Fair Facial Expression: Flat Affect: Anxious, Preoccupied Speech: Soft Interaction: Assertive Motor Activity: Other (Comment) (WDL) Appearance/Hygiene: Unremarkable Behavior Characteristics: Cooperative Mood: Anxious, Pleasant   Aggressive Behavior  Targets: (not recorded)   Thought Process  Thought  Process Coherency: Within Defined Limits Content: Preoccupation Delusions: None reported or observed Perception: Hallucinations Hallucination: Auditory Judgment: Limited Confusion: None  Danger to Self/Others  Danger to Self Current suicidal ideation?: Denies Description of Suicide Plan: (not recorded) Self-Injurious Behavior: (not recorded) Agreement Not to Harm Self: (not recorded) Description of Agreement: (not recorded) Danger to Others: None reported or observed

## 2024-07-01 NOTE — Progress Notes (Addendum)
 Collateral contact - CSW Phouangboupha Jenkins Quale faxed medical leave [facility]) document to patient's [employer]  [phone] on 06/30/2024 at 3:30 PM.  Conversation with patient: Patient said that he called his employer and they didn't get the documents.   CSW faxed the documents to patient's employer [employer], fax # [phone] at 11:35 AM.    Shayne Diguglielmo, LCSWA 07/01/2024

## 2024-07-01 NOTE — Group Note (Signed)
 [name] Group Therapy Note   Group Date: 07/01/2024 Start Time: 1100 End Time: 1200   Participation:  did not attend  Type of Therapy:  Group Therapy  Topic:  From "One Day" to "Today is Day One": Begin Your Journey to Better Health and Mental Well-Being  Objective:  To educate participants on the importance of routine, sleep, diet, and movement for improving mental health and overall well-being. Encourage goal-setting for small, achievable changes.  3 Goals: Encourage participants to set one small, achievable goal related to sleep, diet, or movement for the coming week. Promote self-awareness by discussing the connection between physical and mental health. Support accountability by having participants share goals with the group.  Summary: Participants engaged in a discussion about lifestyle factors influencing mental health, including routines, sleep, nutrition, and movement. They set personal goals for the week to improve well-being and shared their goals for accountability.  Therapeutic Modalities: Cognitive Behavioral Therapy (CBT) for exploring the relationship between thoughts, feelings, and behaviors. Psychoeducation on lifestyle changes and their impact on mental health. Goal-setting to promote empowerment and motivation.   Laurice Iglesia O Adalyna Godbee, LCSWA 07/01/2024  4:39 PM

## 2024-07-01 NOTE — Group Note (Signed)
 Date:  07/01/2024 Time:  1:43 PM  Group Topic/Focus:  Social Work  Pt. Attended SW Group    Annett Redman Ramsay 07/01/2024, 1:43 PM

## 2024-07-01 NOTE — Plan of Care (Signed)
   Problem: Education: Goal: Emotional status will improve Outcome: Progressing Goal: Mental status will improve Outcome: Progressing

## 2024-07-01 NOTE — Group Note (Signed)
 Recreation Therapy Group Note   Group Topic:Leisure Education  Group Date: 07/01/2024 Start Time: 1030 End Time: 1100 Facilitators: Sujay Grundman-McCall, LRT,CTRS Location: [address] Dayroom   Group Topic: Leisure Education   Goal Area(s) Addresses:  Patient will successfully identify positive leisure and recreation activities.  Patient will acknowledge benefits of participation in healthy leisure activities post discharge.  Patient will actively work with peers toward a shared goal.   Behavioral Response:    Intervention: Competitive Group Game    Activity: Keep It Contractor. Patients were spread out throughout the room and were to remain seated at all times. Patients were to hit the beach ball to each other as if playing volleyball. Patients were to keep the ball going for as long as they could. Patients would be timed throughout the activity and if the ball came to a complete stop, the time would start over.    Education:  Teacher, English As A Foreign Language, Leisure as Merchant [employer] Officer, Programmer, Applications, Building Control Surveyor   Education Outcome: Acknowledges education/In group clarification offered/Needs additional education   Clinical Observations/Individualized Feedback: LRT did not conduct group due to lack of staffing. LRT assisted with dayroom.   Plan: Continue to engage patient in RT group sessions 2-3x/week.   Terez Montee-McCall, LRT,CTRS 07/01/2024 12:46 PM

## 2024-07-01 NOTE — Progress Notes (Signed)
 [facility] MD Progress Note  07/01/2024 3:05 PM Elvie DARRIEL UTTER  MRN:  [id number]  Principal Problem: Schizoaffective disorder [facility]) Diagnosis: Principal Problem:   Schizoaffective disorder (HCC)  Reason for Admission:  Efrem Pitstick. Giovanie Lefebre is a 43 year old male with a past psychiatric history of schizophrenia, reported ADHD, GAD and episodes of depression who presented to [facility] on 06/24/24 with acute worsening psychosis.   24-hour Chart Review:  Hydrox x1 and traz x1. VSS. No concerns from nursing. Second LAI given in afternoon.  Today's Assessment Notes:  Reports he is doing well and denies having any hallucinations of the last several days.  Says that he has been having a hallucinations over the last year.  Says that they appear to be coming from inside his head.  Asked if he should be concerned about harming others.  Asked patient if he is having thoughts to harm self or others and he said no.  As the voices are telling him to harm others and he said no.  Educated patient on lack of association between mental health conditions and violence.  Asked patient if he had previous violence and he said no.  Informed patient that he should not be concerned about being violent towards others if all of the previous statements are true.  He reports that the Haldol  has been effective for him.  He denies side effects to Haldol . Pt denies extrapyramidal symptoms including dystonia (sudden spastic contractions of muscle groups), parkinsonism (bradykinesia, tremors, rigidity), and akathisia (severe restlessness).  Says he is open to get the second dose today.  Reports sleeping and eating well.     Past Psychiatric History:  Previous reported psychiatric diagnoses: MDD, ?ADHD, schizophrenia, GAD Current psychiatrist:  Previously seen by Dr. Prentice Espy 04/13/2023, Did not follow-up  Current therapist: none.   Current psychiatric medications: none. Psychiatric  medication history/compliance: Per chart review, poor.  Full med trials include: adderall, bupropion, duloxetine , haloperidol , hydroxyzine  (ineffective), lorazepam , olanzapine , ziprasidone , paliperidone , risperidone ,   Psychiatric hospitalization(s): Multiple inpatient hospitalizations at [facility] 6/11-6/19/2024, 6/22-03/06/2023, 1/25-10/12/2023. Most recent stay was 5/10-5/20/2025  Psychotherapy history: None Neuromodulation history: none. History of suicide (obtained from HPI): Reported 1 attempted OD in distant past, presently denied History of homicide or aggression (obtained in HPI): none. Rehab History: Has previously lived at [facility] house   Past Medical History:  Past Medical History:  Diagnosis Date   Paranoid schizophrenia (HCC) 10/03/2023   Schizophrenia [facility])     Past Surgical History:  Procedure Laterality Date   APPENDECTOMY     Family History: History reviewed. No pertinent family history. Family Psychiatric  History: See H&P  Social History:  Social History   Substance and Sexual Activity  Alcohol Use Not Currently   Alcohol/week: 5.0 standard drinks of alcohol   Types: 5 Cans of beer per week     Social History   Substance and Sexual Activity  Drug Use No    Social History   Socioeconomic History   Marital status: Single    Spouse name: Not on file   Number of children: Not on file   Years of education: Not on file   Highest education level: Not on file  Occupational History   Not on file  Tobacco Use   Smoking status: Former    Current packs/day: 0.50    Types: Cigarettes   Smokeless tobacco: Never  Vaping Use   Vaping status: Never Used  Substance and Sexual Activity   Alcohol  use: Not Currently    Alcohol/week: 5.0 standard drinks of alcohol    Types: 5 Cans of beer per week   Drug use: No   Sexual activity: Not Currently  Other Topics Concern   Not on file  Social History Narrative   Reports new onset CAH at 43 years old, denies prior hx    Social Drivers of Corporate Investment Banker Strain: Not on file  Food Insecurity: No Food Insecurity (06/23/2024)   Hunger Vital Sign    Worried About Running Out of Food in the Last Year: Never true    Ran Out of Food in the Last Year: Never true  Transportation Needs: No Transportation Needs (06/23/2024)   PRAPARE - Administrator, Civil Service (Medical): No    Lack of Transportation (Non-Medical): No  Physical Activity: Not on file  Stress: Not on file  Social Connections: Unknown (06/23/2024)   Social Connection and Isolation Panel    Frequency of Communication with Friends and Family: Not on file    Frequency of Social Gatherings with Friends and Family: Never    Attends Religious Services: Never    Database Administrator or Organizations: No    Attends Engineer, Structural: Never    Marital Status: Patient declined   Additional Social History:     Current Medications: Current Facility-Administered Medications  Medication Dose Route Frequency Provider Last Rate Last Admin   acetaminophen  (TYLENOL ) tablet 650 mg  650 mg Oral Q6H PRN Tex Drilling, NP   650 mg at 06/27/24 2305   alum & mag hydroxide-simeth (MAALOX/MYLANTA) 200-200-20 MG/5ML suspension 30 mL  30 mL Oral Q4H PRN Tex Drilling, NP       benztropine  (COGENTIN ) tablet 0.5 mg  0.5 mg Oral BID Tex Drilling, NP   0.5 mg at 07/01/24 0844   haloperidol  (HALDOL ) tablet 5 mg  5 mg Oral TID PRN Tex Drilling, NP   5 mg at 06/27/24 1049   And   diphenhydrAMINE  (BENADRYL ) capsule 50 mg  50 mg Oral TID PRN Tex Drilling, NP   50 mg at 06/27/24 1049   haloperidol  lactate (HALDOL ) injection 5 mg  5 mg Intramuscular TID PRN Tex Drilling, NP       And   diphenhydrAMINE  (BENADRYL ) injection 50 mg  50 mg Intramuscular TID PRN Tex Drilling, NP       And   LORazepam  (ATIVAN ) injection 2 mg  2 mg Intramuscular TID PRN Tex Drilling, NP       haloperidol  lactate (HALDOL ) injection 10 mg  10 mg  Intramuscular TID PRN Tex Drilling, NP       And   diphenhydrAMINE  (BENADRYL ) injection 50 mg  50 mg Intramuscular TID PRN Tex Drilling, NP       And   LORazepam  (ATIVAN ) injection 2 mg  2 mg Intramuscular TID PRN Tex Drilling, NP       [START ON 07/29/2024] haloperidol  decanoate (HALDOL  DECANOATE) 100 MG/ML injection 200 mg  200 mg Intramuscular Q28 days Cornelius Dines, MD       hydrOXYzine  (ATARAX ) tablet 50 mg  50 mg Oral TID PRN Blair, Christal H, NP   50 mg at 06/30/24 2017   magnesium  hydroxide (MILK OF MAGNESIA) suspension 30 mL  30 mL Oral Daily PRN Tex Drilling, NP       traZODone  (DESYREL ) tablet 50 mg  50 mg Oral QHS PRN Tex Drilling, NP   50 mg at 06/30/24 2017  Lab Results:  No results found for this or any previous visit (from the past 48 hours).  Blood Alcohol level:  Lab Results  Component Value Date   ETH <15 06/23/2024   ETH 23 (H) 02/02/2024   Metabolic Disorder Labs: Lab Results  Component Value Date   HGBA1C 4.8 06/23/2024   MPG 91.06 06/23/2024   MPG 103 01/15/2024   Lab Results  Component Value Date   PROLACTIN 15.6 06/23/2024   Lab Results  Component Value Date   CHOL 180 06/23/2024   TRIG 102 06/23/2024   HDL 41 06/23/2024   CHOLHDL 4.4 06/23/2024   VLDL 20 06/23/2024   LDLCALC 119 (H) 06/23/2024   LDLCALC 92 01/15/2024    Physical Findings: AIMS: 0  Musculoskeletal: Strength & Muscle Tone: within normal limits Gait & Station: normal Patient leans: N/A  Psychiatric Specialty Exam: Mental Status Exam:  Appearance: Alert, caucasian male who appears approximately stated age.   Behavior: Good eye contact, no apparent internal stimuli  Attitude: Cooperative, polite  Speech:  WNL  Mood: Pretty good  Affect: Depressed, restricted  Thought Process: WDL, no apparent disorganization.  Thought Content: denies paranoia, delusions  SI/HI: Denies  Perceptions: denies, not seen responding  Judgement: Fair  Insight: Fair  Fund of  Knowledge: WNL    Physical Exam: Physical Exam Vitals and nursing note reviewed.  Constitutional:      General: He is not in acute distress.    Appearance: He is not ill-appearing.  HENT:     Head: Normocephalic and atraumatic.  Pulmonary:     Effort: Pulmonary effort is normal.  Neurological:     General: No focal deficit present.     Mental Status: He is alert.  Psychiatric:     Comments: No obvious EPS.    Review of Systems  Constitutional:  Negative for fever.  Cardiovascular:  Negative for chest pain and palpitations.  Gastrointestinal:  Negative for constipation, diarrhea, nausea and vomiting.  Neurological:  Negative for dizziness, weakness and headaches.  Psychiatric/Behavioral:         Pt denies extrapyramidal symptoms including dystonia (sudden spastic contractions of muscle groups), parkinsonism (bradykinesia, tremors, rigidity), and akathisia (severe restlessness).    Blood pressure 105/71, pulse 75, temperature 97.9 F (36.6 C), temperature source Oral, resp. rate 20, height 6' (1.829 m), weight 102.1 kg, SpO2 100%. Body mass index is 30.52 kg/m.  Treatment Plan Summary: Daily contact with patient to assess and evaluate symptoms and progress in treatment and Medication management  Assessment: Liandro Thelin. Elmor Kost is a 43 year old male with a past psychiatric history of schizophrenia, reported ADHD, GAD and episodes of depression who presented to [facility] on 06/24/24 with acute worsening psychosis.  07/01/24 Tolerating Haldol  so far without side effects.  Will give the second dose today and plan for discharge tomorrow after observation to assess for any reaction to the injection.  PLAN: Safety and Monitoring:             -- Voluntary admission to inpatient psychiatric unit for safety, stabilization and treatment             -- Daily contact with patient to assess and evaluate symptoms and progress in treatment              -- Patient's case to be discussed in multi-disciplinary team meeting             -- Observation Level: q15 minute checks             --  Vital signs:  q12 hours             -- Precautions: suicide, elopement, and assault   2. Psychiatric Diagnoses and Treatment:  # Schizophrenia   -- Haldol  dec LAI for schziphrenia  - Initial dose 100 mg on 10/25  - Second dose 100 mg on Monday 10/27  - continue maintenance 200 mg once every 28 days starting 11/24             -- Continue Cogentin  0.5 mg 2 times daily, EPS prevention   -- Continue hydroxyzine  50 mg oral, 3 times daily as needed, anxiety -- Trazodone  50 mg, oral, daily at bedtime as needed, sleep             -- Haldol  BH Agitation Protocol (See MAR)              3. Medical Issues Being Addressed:           N/A    4. Labs  -- CBC: Unremarkable             -- CMP: Creatinine 1.30; Calcium 8.5; Total protein 5.5; Albumin 3.2, otherwise WNL             -- Ethanol: <15             -- Lipid Panel: LDL cholesterol 119, otherwise WNL             -- HIV: Non Reactive             -- HgBA1c: WNL             -- Magnesium : WNL             -- Prolactin: 15.6 normal             -- RPR: Non Reactive             -- TSH: WNL             -- UA: Straw urine color, otherwise WNL              -- EKG: QT/QTc 378/449    -- The risks/benefits/side-effects/alternatives to this medication were discussed in detail with the patient and time was given for questions. The patient consents to medication trial.  -- FDA -- Metabolic profile and EKG monitoring obtained while on an atypical antipsychotic (BMI: Lipid Panel: HbgA1c: QTc:)               -- Encouraged patient to participate in unit milieu and in scheduled group therapies  -- Short Term Goals: Ability to identify changes in lifestyle to reduce recurrence of condition will improve, Ability to verbalize feelings will improve, Ability to disclose and discuss suicidal ideas, Ability to demonstrate  self-control will improve, Ability to identify and develop effective coping behaviors will improve, Ability to maintain clinical measurements within normal limits will improve, Compliance with prescribed medications will improve, and Ability to identify triggers associated with substance abuse/mental health issues will improve             -- Long Term Goals: Improvement in symptoms so as ready for discharge   5. Discharge Planning:  -- Social work and case management to assist with discharge planning and identification of hospital follow-up needs prior to discharge -- Estimated LOS: 07/02/2024 -- Discharge Concerns: Need to establish a safety plan; Medication compliance and effectiveness -- Discharge Goals: Return home with outpatient referrals for mental health follow-up including medication management/psychotherapy  Physician Treatment Plan for Primary Diagnosis: Schizoaffective disorder (HCC) Long Term Goal(s): Improvement in symptoms so as ready for discharge   Short Term Goals: Ability to identify changes in lifestyle to reduce recurrence of condition will improve and Ability to identify triggers associated with substance abuse/mental health issues will improve   I certify that inpatient services furnished can reasonably be expected to improve the patient's condition.    Justino Cornish, MD 07/01/2024, 3:05 PM  Patient ID: William Romero, male   DOB: August 25, 1981, 43 y.o.   MRN: [id number]

## 2024-07-01 NOTE — Progress Notes (Signed)
   06/30/24 2300  Psychosocial Assessment  Patient Complaints None  Eye Contact Fair  Facial Expression Flat  Affect Preoccupied  Speech Soft  Interaction Assertive  Motor Activity Other (Comment)  Appearance/Hygiene Unremarkable  Behavior Characteristics Cooperative  Mood Pleasant  Aggressive Behavior  Effect No apparent injury  Thought Process  Coherency WDL  Content Preoccupation  Delusions None reported or observed  Perception Hallucinations  Hallucination Auditory  Judgment Limited  Confusion None  Danger to Self  Current suicidal ideation? Denies

## 2024-07-01 NOTE — Group Note (Signed)
 Date:  07/01/2024 Time:  8:26 PM  Group Topic/Focus:  Wrap-Up Group:   The focus of this group is to help patients review their daily goal of treatment and discuss progress on daily workbooks.    Participation Level:  Did Not Attend  Participation Quality:  none  Affect:  n/a  Cognitive:  n/a  Insight: None  Engagement in Group:  None  Modes of Intervention:  None  Additional Comments:   Pt was encouraged but refused to attend wrap up group  Judia Arnott A Bellamarie Pflug 07/01/2024, 8:26 PM

## 2024-07-02 DIAGNOSIS — F259 Schizoaffective disorder, unspecified: Secondary | ICD-10-CM | POA: Diagnosis not present

## 2024-07-02 MED ORDER — HYDROXYZINE HCL 50 MG PO TABS: 50.0000 mg | ORAL_TABLET | Freq: Three times a day (TID) | ORAL | 0 refills | Status: DC | PRN

## 2024-07-02 MED ORDER — BENZTROPINE MESYLATE 0.5 MG PO TABS: 0.5000 mg | ORAL_TABLET | Freq: Two times a day (BID) | ORAL | 0 refills | Status: DC

## 2024-07-02 MED ORDER — TRAZODONE HCL 50 MG PO TABS: 50.0000 mg | ORAL_TABLET | Freq: Every evening | ORAL | 0 refills | Status: DC | PRN

## 2024-07-02 MED ORDER — HALOPERIDOL DECANOATE 100 MG/ML IM SOLN: 200.0000 mg | INTRAMUSCULAR | Status: DC

## 2024-07-02 NOTE — Progress Notes (Signed)
 Patient discharged off unit at 1008. Patient belongings reviewed and acknowledged by patient. AVS and Transition Record reviewed and acknowledged by patient. Safety plan completed by patient, reviewed by nurse with patient and copy provided. Any medications and or prescriptions necessary for discharge addressed and provided to patient. Patient denies SI, plan or intent. Denies HI. Denies AVH. No observed or reported side effects to medication. No observed or reported agitation, aggression, or other acute emotional distress. No reported or observed physical abnormalities or concerns. Patient transportation from facility verified and observed.

## 2024-07-02 NOTE — Progress Notes (Addendum)
  [facility] Adult Case Management Discharge Plan :  Will you be returning to the same living situation after discharge:  No, patient will go to [facility]) At discharge, do you have transportation home?: Yes,  CSW arranged transportation through [facility] for 10:00 AM  Do you have the ability to pay for your medications: Yes,  patient has insurance.  Patient was given bus passes for medical appointments.   Release of information consent forms completed and in the chart;  Patient's signature needed at discharge.  Patient to Follow up at:  Follow-up Information     [facility]. Go on 07/08/2024.   Specialty: Behavioral Health Why: You have an appointment for medication management services on 07/08/24 at 2:00 pm. Contact information: [address] [place]  [address] [phone]        [facility] Go on 07/03/2024.   Specialty: Professional Counselor Why: Please go to this provider on 07/03/24 at 9:00 am for an initial assessment, to obtain an appointment for therapy services.  You may also go Monday through Friday, from 9 am to 1 pm. Contact information: [address] [place]  [address] [place] [address] [phone]                 Next level of care provider has access to [facility] Link:no  Safety Planning and Suicide Prevention discussed: Yes,  with patient   Has patient been referred to the Quitline?: Patient does not use tobacco/nicotine  products.  Patient said that he quit smoking a few years ago.  In the past, patient was diagnosed with tobacco use disorder.  Patient has been referred for addiction treatment: Patient refused referral for treatment.  Patient said, I don't have a problem with marijuana.  He said that he quit using years ago.  Five months ago, patient's urinary drug screen tested positive for marijuana, there is no current urine drug screen available.  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     Loden Laurent O Chestine Belknap, LCSWA 07/02/2024, 9:38 AM

## 2024-07-02 NOTE — Progress Notes (Signed)
(  Sleep Hours) -8 (Any PRNs that were needed, meds refused, or side effects to meds)- Atarax  and Trazodone  (Any disturbances and when (visitation, over night)-none (Concerns raised by the patient)- none (SI/HI/AVH)-denied

## 2024-07-02 NOTE — Progress Notes (Signed)
   07/01/24 2151  Psych Admission Type (Psych Patients Only)  Admission Status Voluntary  Psychosocial Assessment  Patient Complaints None  Eye Contact Fair  Facial Expression Flat  Affect Preoccupied  Speech Soft  Interaction Assertive  Motor Activity Other (Comment)  Appearance/Hygiene Unremarkable  Behavior Characteristics Cooperative  Mood Anxious;Pleasant  Aggressive Behavior  Effect No apparent injury  Thought Process  Coherency WDL  Content Preoccupation  Delusions None reported or observed  Perception Hallucinations  Hallucination Auditory  Judgment Limited  Confusion None  Danger to Self  Current suicidal ideation? Denies

## 2024-07-02 NOTE — Progress Notes (Signed)
 Patient was given return to work note.   Oleta Gunnoe, LCSWA 07/02/2024

## 2024-07-02 NOTE — [facility]HH Suicide Risk Assessment (Signed)
 Suicide Risk Assessment  Discharge Assessment    [facility] Discharge Suicide Risk Assessment   Principal Problem: Schizoaffective disorder [facility]) Discharge Diagnoses: Principal Problem:   Schizoaffective disorder (HCC)  William Romero is a 43 year old male with a past psychiatric history of schizophrenia, reported ADHD, GAD and episodes of depression who presented to [facility] on 06/24/24 with acute worsening psychosis.   Total Time spent with patient: 30 minutes  Musculoskeletal: Strength & Muscle Tone: within normal limits Gait & Station: normal Patient leans: N/A  Psychiatric Specialty Exam  Presentation  General Appearance:  Appropriate for Environment  Eye Contact: Good  Speech: Normal Rate  Speech Volume: Normal  Handedness: Right   Mood and Affect  Mood: Euthymic  Duration of Depression Symptoms: Greater than two weeks  Affect: Appropriate; Congruent; Full Range   Thought Process  Thought Processes: Coherent; Linear  Descriptions of Associations:Intact  Orientation:Full (Time, Place and Person)  Thought Content:Logical  History of Schizophrenia/Schizoaffective disorder:No  Duration of Psychotic Symptoms:Less than six months  Hallucinations:Hallucinations: None  Ideas of Reference:None  Suicidal Thoughts:Suicidal Thoughts: No  Homicidal Thoughts:Homicidal Thoughts: No   Sensorium  Memory: Immediate Good; Recent Good; Remote Good  Judgment: Good  Insight: Good   Executive Functions  Concentration: Good  Attention Span: Good  Recall: Good  Fund of Knowledge: Good  Language: Good   Psychomotor Activity  Psychomotor Activity:Psychomotor Activity: Normal   Assets  Assets: Desire for Improvement   Sleep  Sleep:Sleep: Good  Estimated Sleeping Duration (Last 24 Hours): 7.00-7.75 hours  Physical Exam: Physical Exam Vitals and nursing note reviewed.  HENT:      Head: Normocephalic and atraumatic.  Pulmonary:     Effort: Pulmonary effort is normal.  Neurological:     General: No focal deficit present.     Mental Status: He is alert.  Psychiatric:     Comments: No obvious EPS.    Review of Systems  Constitutional:  Negative for fever.  Cardiovascular:  Negative for chest pain and palpitations.  Gastrointestinal:  Negative for constipation, diarrhea, nausea and vomiting.  Neurological:  Negative for dizziness, weakness and headaches.  Psychiatric/Behavioral:         Pt denies extrapyramidal symptoms including dystonia (sudden spastic contractions of muscle groups), parkinsonism (bradykinesia, tremors, rigidity), and akathisia (severe restlessness).   Blood pressure 107/79, pulse 78, temperature 98 F (36.7 C), temperature source Oral, resp. rate 20, height 6' (1.829 m), weight 102.1 kg, SpO2 97%. Body mass index is 30.52 kg/m.  Mental Status Per Nursing Assessment::   On Admission:  Thoughts of violence towards others  Demographic Factors:  Male, Caucasian, and Low socioeconomic status  Loss Factors: NA  Historical Factors: NA  Risk Reduction Factors:   Employed, Living with another person, especially a relative, and Positive social support  Continued Clinical Symptoms:  Mood is stable. Anxiety at a manageable level. Denying any SI including passive SI.   Cognitive Features That Contribute To Risk:  None    Suicide Risk:  Mild:  There are no identifiable suicide plans, no associated intent, mild dysphoria and related symptoms, good self-control (both objective and subjective assessment), few other risk factors, and identifiable protective factors, including available and accessible social support.   Follow-up Information     [facility]. Go on 07/08/2024.   Specialty: Behavioral Health Why: You have an appointment for medication management services on 07/08/24 at 2:00 pm. Contact information: [address] [place]  [place] [address] [phone]        [facility] Go on 07/03/2024.   Specialty: Professional Counselor Why: Please go to this provider on 07/03/24 at 9:00 am for an initial assessment, to obtain an appointment for therapy services.  You may also go Monday through Friday, from 9 am to 1 pm. Contact information: [address] [place]  [address] [place] [address] [phone]                 Plan Of Care/Follow-up recommendations:  Activity: as tolerated  Diet: heart healthy  Other: -Follow-up with your outpatient psychiatric provider -instructions on appointment date, time, and address (location) are provided to you in discharge paperwork.  -Take your psychiatric medications as prescribed at discharge - instructions are provided to you in the discharge paperwork  -Follow-up with outpatient primary care doctor and other specialists -for management of chronic medical disease, including: n/a  -Testing: Follow-up with outpatient provider for abnormal lab results: n/a  -Recommend abstinence from alcohol, tobacco, and other illicit drug use at discharge.   -If your psychiatric symptoms recur, worsen, or if you have side effects to your psychiatric medications, call your outpatient psychiatric provider, 911, [phone] or go to the nearest emergency department.  -If suicidal thoughts recur, call your outpatient psychiatric provider, 911, [phone] or go to the nearest emergency department.   Justino Cornish, MD 07/02/2024, 8:48 AM

## 2024-07-02 NOTE — Discharge Instructions (Signed)
-  Follow-up with your outpatient psychiatric provider (and therapist) -instructions on appointment date, time, and address (location) are provided to you in discharge paperwork.  -Take your psychiatric medications as prescribed at discharge - instructions are provided to you in the discharge paperwork  -Follow-up with outpatient primary care doctor and other specialists -for management of preventative medicine and any chronic medical disease.  -Recommend abstinence from alcohol, tobacco, cannabis, and other substances at discharge.   -If your psychiatric symptoms recur, worsen, or if you have severe side effects to your psychiatric medications, call your outpatient psychiatric provider, 911, 988 (national suicide hotline), go to [facility], or go to the nearest emergency department.  -If suicidal thoughts occur, call your outpatient psychiatric provider, 911, 988 (national suicide hotline), go to [facility], or go to the nearest emergency department.  Naloxone (Narcan) can help reverse an overdose when given to the victim quickly.  [facility] offers free naloxone kits and instructions/training on its use.  Add naloxone to your first aid kit and you can help save a life.   Pick up your free kit at the following locations:   [place]:  [facility] of [facility], [address] [place] [address] [phone]) [facility] [address] [place] [address] [phone]) [facility] [address] [place] [address]  [place]: [facility] of [facility] [address] [place] [address] ([phone]) [facility] [address] [place] [address] [phone])

## 2024-07-02 NOTE — Progress Notes (Signed)
 Conversation with patient:  Patient provided his address:  [address], [place], [place], however he didn't have the key.  He lives with his roommates however didn't want CSW to speak with them.  Patient said that he can go to the [facility].    William Romero, LCSWA 07/01/2024

## 2024-07-02 NOTE — Transportation (Signed)
 07/02/2024  Istvan SCOTT Cornwall DOB: 1981-05-28 MRN: [id number]   RIDER WAIVER AND RELEASE OF LIABILITY  For the purposes of helping with transportation needs, [facility] partners with outside transportation providers (taxi companies, [place], catering manager.) to give [facility] patients or other approved people the choice of on-demand rides Public Librarian) to our buildings for non-emergency visits.  By using [employer], I, the person signing this document, on behalf of myself and/or any legal minors (in my care using the [employer]), agree:  Science Writer given to me are supplied by independent, outside transportation providers who do not work for, or have any affiliation with, [employer]. [place] is not a transportation company. [facility] has no control over the quality or safety of the rides I get using [employer]. [facility] has no control over whether any outside ride will happen on time or not. [facility] gives no guarantee on the reliability, quality, safety, or availability on any rides, or that no mistakes will happen. I know and accept that traveling by vehicle (car, truck, SVU, fleeta, bus, taxi, etc.) has risks of serious injuries such as disability, being paralyzed, and death. I know and agree the risk of using [employer] is mine alone, and not [employer]. [employer] are provided as is and as are available. The transportation providers are in charge for all inspections and care of the vehicles used to provide these rides. I agree not to take legal action against [facility], its agents, employees, officers, directors, representatives, insurers, attorneys, assigns, successors, subsidiaries, and affiliates at any time for any reasons related directly or indirectly to using [employer]. I also agree not to take legal action against [facility] or its affiliates for any injury, death, or damage to property caused by or related to  using [employer]. I have read this Waiver and Release of Liability, and I understand the terms used in it and their legal meaning. This Waiver is freely and voluntarily given with the understanding that my right (or any legal minors) to legal action against [place] relating to [employer] is knowingly given up to use these services.   I attest that I read the Ride Waiver and Release of Liability to Azahel [name] MINERVA, gave Mr. Jasperson the opportunity to ask questions and answered the questions asked (if any). I affirm that Jan SCOTT Sciortino then provided consent for assistance with transportation.       Annistyn Depass, LCSWA 07/02/2024

## 2024-07-02 NOTE — [facility] Suicide Risk Assessment (Signed)
 [facility] INPATIENT:  Family/Significant Other Suicide Prevention Education  Suicide Prevention Education:  Education Completed; with patient,  (name of family member/significant other) has been identified by the patient as the family member/significant other with whom the patient will be residing, and identified as the person(s) who will aid the patient in the event of a mental health crisis (suicidal ideations/suicide attempt).  With written consent from the patient, the family member/significant other has been provided the following suicide prevention education, prior to the and/or following the discharge of the patient.  Patient was given Suicide Prevention Information brochure.  Patient said he doesn't have any guns or weapons.    The suicide prevention education provided includes the following: Suicide risk factors Suicide prevention and interventions National Suicide Hotline telephone number [facility] assessment telephone number [facility] Emergency Assistance 911 [facility] and/or Residential Mobile Crisis Unit telephone number  Request made of family/significant other to: Remove weapons (e.g., guns, rifles, knives), all items previously/currently identified as safety concern.   Remove drugs/medications (over-the-counter, prescriptions, illicit drugs), all items previously/currently identified as a safety concern.  The family member/significant other verbalizes understanding of the suicide prevention education information provided.  The family member/significant other agrees to remove the items of safety concern listed above.  Tylisha Danis O Jhordan Kinter, LCSWA 07/02/2024, 9:15 AM

## 2024-07-02 NOTE — Discharge Summary (Signed)
 Physician Discharge Summary Note  Patient:  William Romero is an 43 y.o., male MRN:  [id number] DOB:  1980/11/25 Patient phone:  [phone] (home)  Patient address:   [address] [place] [address],  Total Time spent with patient: 30 minutes  Date of Admission:  06/23/2024 Date of Discharge: 07/02/2024   William Romero is a 43 year old male with a past psychiatric history of schizophrenia, reported ADHD, GAD and episodes of depression who presented to [facility] on 06/24/24 with acute worsening psychosis.   Principal Problem: Schizoaffective disorder [facility]) Discharge Diagnoses: Principal Problem:   Schizoaffective disorder [facility])   Past Psychiatric History (Copied and modified from previous encounter):  Previous reported psychiatric diagnoses: MDD, ?ADHD, schizophrenia, GAD Current psychiatrist:  Previously seen by Dr. Prentice Espy 04/13/2023, Did not follow-up  Current therapist: none.   Current psychiatric medications: none. Psychiatric medication history/compliance: Per chart review, poor.  Full med trials include: adderall, bupropion, duloxetine , haloperidol , hydroxyzine  (ineffective), lorazepam , olanzapine , ziprasidone , paliperidone , risperidone ,    Psychiatric hospitalization(s): Multiple inpatient hospitalizations at [facility] 6/11-6/19/2024, 6/22-03/06/2023, 1/25-10/12/2023. Most recent stay was 5/10-5/20/2025   Psychotherapy history: None Neuromodulation history: none. History of suicide (obtained from HPI): Reported 1 attempted OD in distant past, presently denied History of homicide or aggression (obtained in HPI): none. Rehab History: Has previously lived at [facility] house   Past Medical History:  Past Medical History:  Diagnosis Date   Paranoid schizophrenia (HCC) 10/03/2023   Schizophrenia [facility])     Past Surgical History:  Procedure Laterality Date   APPENDECTOMY     Family History: History reviewed.  No pertinent family history. Family Psychiatric  History: denies Social History:  Social History   Substance and Sexual Activity  Alcohol Use Not Currently   Alcohol/week: 5.0 standard drinks of alcohol   Types: 5 Cans of beer per week     Social History   Substance and Sexual Activity  Drug Use No    Social History   Socioeconomic History   Marital status: Single    Spouse name: Not on file   Number of children: Not on file   Years of education: Not on file   Highest education level: Not on file  Occupational History   Not on file  Tobacco Use   Smoking status: Former    Current packs/day: 0.50    Types: Cigarettes   Smokeless tobacco: Never  Vaping Use   Vaping status: Never Used  Substance and Sexual Activity   Alcohol use: Not Currently    Alcohol/week: 5.0 standard drinks of alcohol    Types: 5 Cans of beer per week   Drug use: No   Sexual activity: Not Currently  Other Topics Concern   Not on file  Social History Narrative   Reports new onset CAH at 43 years old, denies prior hx   Social Drivers of Corporate Investment Banker Strain: Not on file  Food Insecurity: No Food Insecurity (06/23/2024)   Hunger Vital Sign    Worried About Running Out of Food in the Last Year: Never true    Ran Out of Food in the Last Year: Never true  Transportation Needs: No Transportation Needs (06/23/2024)   PRAPARE - Administrator, Civil Service (Medical): No    Lack of Transportation (Non-Medical): No  Physical Activity: Not on file  Stress: Not on file  Social Connections: Unknown (06/23/2024)   Social Connection and Isolation Panel  Frequency of Communication with Friends and Family: Not on file    Frequency of Social Gatherings with Friends and Family: Never    Attends Religious Services: Never    Database Administrator or Organizations: No    Attends Banker Meetings: Never    Marital Status: Patient declined    Hospital  course: Patient came in with auditory hallucinations. Responded well to haldol .  Transition to long-acting injectable which he tolerated without side effects.  Only appear to be having on hallucinations and started about a year ago without any clear substance relationship.  Was back to baseline had open no evidence of psychosis on day of discharge.  During the patient's hospitalization, patient had extensive initial psychiatric evaluation, and follow-up psychiatric evaluations every day.  Psychiatric diagnoses provided upon initial assessment:  Principal Problem:   Schizoaffective disorder [facility])   Psychiatric medications: Restart Haldol  oral and titrated 10 twice daily and then discontinued in favor of long-acting injectable Haldol  dec LAI for schziphrenia             - Initial dose 100 mg on 10/25             - Second dose 100 mg on Monday 10/27             - continue maintenance 200 mg once every 28 days starting 11/24   Patient's care was discussed during the interdisciplinary team meeting every day during the hospitalization.  The patient is not having side effects to prescribed psychiatric medication.  Gradually, patient started adjusting to milieu. The patient was evaluated each day by a clinical provider to ascertain response to treatment. Improvement was noted by the patient's report of decreasing symptoms, improved sleep and appetite, affect, medication tolerance, behavior, and participation in unit programming.  Patient was asked each day to complete a self inventory noting mood, mental status, pain, new symptoms, anxiety and concerns.   Symptoms were reported as significantly decreased or resolved completely by discharge.  The patient reports that their mood is stable.  The patient denied having suicidal thoughts for more than 48 hours prior to discharge.  Patient denies having homicidal thoughts.  Patient denies having auditory hallucinations.  Patient denies any visual  hallucinations or other symptoms of psychosis.  The patient was motivated to continue taking medication with a goal of continued improvement in mental health.   The patient reports their target psychiatric symptoms of auditory hallucinations responded well to the psychiatric medications, and the patient reports overall benefit other psychiatric hospitalization. Supportive psychotherapy was provided to the patient. The patient also participated in regular group therapy while hospitalized. Coping skills, problem solving as well as relaxation therapies were also part of the unit programming.  Labs were reviewed with the patient, and abnormal results were discussed with the patient.  The patient is able to verbalize their individual safety plan to this provider.  # It is recommended to the patient to continue psychiatric medications as prescribed, after discharge from the hospital.    # It is recommended to the patient to follow up with your outpatient psychiatric provider and PCP.  # It was discussed with the patient, the impact of alcohol, drugs, tobacco have been there overall psychiatric and medical wellbeing, and total abstinence from substance use was recommended the patient.ed.  # Prescriptions provided or sent directly to preferred pharmacy at discharge. Patient agreeable to plan. Given opportunity to ask questions. Appears to feel comfortable with discharge.    # In  the event of worsening symptoms, the patient is instructed to call the crisis hotline, 911 and or go to the nearest ED for appropriate evaluation and treatment of symptoms. To follow-up with primary care provider for other medical issues, concerns and or health care needs  # Patient was discharged home with a plan to follow up as noted below.    On day of discharge patient denies having any SI, HI, AVH.  Denies paranoia.  Reports he is back to his baseline.  Denies side effects to medications. Pt denies extrapyramidal symptoms  including dystonia (sudden spastic contractions of muscle groups), parkinsonism (bradykinesia, tremors, rigidity), and akathisia (severe restlessness).  Denies any issues at injection site.  Reports that he will go back to his home at discharge.  Asking for a bus pass.  Says he does not have anyone in his life for safety planning.  Reports that he is eating and sleeping well.    Physical Findings: AIMS: Facial and Oral Movements Muscles of Facial Expression: None Lips and Perioral Area: None Jaw: None Tongue: None,Extremity Movements Upper (arms, wrists, hands, fingers): None Lower (legs, knees, ankles, toes): None, Trunk Movements Neck, shoulders, hips: None, Global Judgements Severity of abnormal movements overall : None Incapacitation due to abnormal movements: None Patient's awareness of abnormal movements: No Awareness, Dental Status Current problems with teeth and/or dentures?: No Does patient usually wear dentures?: No Edentia?: No, Other Do movements disappear in sleep?: No, AIMS Total Score AIMS Total Score: 0   Musculoskeletal: Strength & Muscle Tone: within normal limits Gait & Station: normal Patient leans: N/A   Psychiatric Specialty Exam:  Presentation  General Appearance:  Appropriate for Environment  Eye Contact: Good  Speech: Normal Rate  Speech Volume: Normal  Handedness: Right   Mood and Affect  Mood: Euthymic  Affect: Appropriate; Congruent; Full Range   Thought Process  Thought Processes: Coherent; Linear  Descriptions of Associations:Intact  Orientation:Full (Time, Place and Person)  Thought Content:Logical  History of Schizophrenia/Schizoaffective disorder:No  Duration of Psychotic Symptoms:Less than six months  Hallucinations:Hallucinations: None  Ideas of Reference:None  Suicidal Thoughts:Suicidal Thoughts: No  Homicidal Thoughts:Homicidal Thoughts: No   Sensorium  Memory: Immediate Good; Recent Good; Remote  Good  Judgment: Good  Insight: Good   Executive Functions  Concentration: Good  Attention Span: Good  Recall: Good  Fund of Knowledge: Good  Language: Good   Psychomotor Activity  Psychomotor Activity: Psychomotor Activity: Normal   Assets  Assets: Desire for Improvement   Sleep  Sleep: Sleep: Good  Estimated Sleeping Duration (Last 24 Hours): 6.75-7.50 hours   Physical Exam: Physical Exam ROS Blood pressure 107/79, pulse 78, temperature 98 F (36.7 C), temperature source Oral, resp. rate 20, height 6' (1.829 m), weight 102.1 kg, SpO2 97%. Body mass index is 30.52 kg/m.   Social History   Tobacco Use  Smoking Status Former   Current packs/day: 0.50   Types: Cigarettes  Smokeless Tobacco Never   Tobacco Cessation:  N/A, patient does not currently use tobacco products   Blood Alcohol level:  Lab Results  Component Value Date   ETH <15 06/23/2024   ETH 23 (H) 02/02/2024    Metabolic Disorder Labs:  Lab Results  Component Value Date   HGBA1C 4.8 06/23/2024   MPG 91.06 06/23/2024   MPG 103 01/15/2024   Lab Results  Component Value Date   PROLACTIN 15.6 06/23/2024   Lab Results  Component Value Date   CHOL 180 06/23/2024   TRIG 102 06/23/2024  HDL 41 06/23/2024   CHOLHDL 4.4 06/23/2024   VLDL 20 06/23/2024   LDLCALC 119 (H) 06/23/2024   LDLCALC 92 01/15/2024    See Psychiatric Specialty Exam and Suicide Risk Assessment completed by Attending Physician prior to discharge.  Discharge destination:  Home  Is patient on multiple antipsychotic therapies at discharge:  No     Discharge Instructions     Diet - low sodium heart healthy   Complete by: As directed    Increase activity slowly   Complete by: As directed       Allergies as of 07/02/2024   No Known Allergies      Medication List     STOP taking these medications    Vyvanse 50 MG capsule Generic drug: lisdexamfetamine       TAKE these medications       Indication  benztropine  0.5 MG tablet Commonly known as: COGENTIN  Take 1 tablet (0.5 mg total) by mouth 2 (two) times daily.  Indication: Extrapyramidal Reaction caused by Medications   haloperidol  decanoate 100 MG/ML injection Commonly known as: HALDOL  DECANOATE Inject 2 mLs (200 mg total) into the muscle every 28 (twenty-eight) days. Start taking on: July 29, 2024  Indication: Schizophrenia   hydrOXYzine  50 MG tablet Commonly known as: ATARAX  Take 1 tablet (50 mg total) by mouth 3 (three) times daily as needed for anxiety.  Indication: Feeling Anxious   traZODone  50 MG tablet Commonly known as: DESYREL  Take 1 tablet (50 mg total) by mouth at bedtime as needed for sleep.  Indication: Schizophrenia with Depression        Follow-up Information     [facility]. Go on 07/08/2024.   Specialty: Behavioral Health Why: You have an appointment for medication management services on 07/08/24 at 2:00 pm. Contact information: [address] [place]  [address] [phone]        [facility] Go on 07/03/2024.   Specialty: Professional Counselor Why: Please go to this provider on 07/03/24 at 9:00 am for an initial assessment, to obtain an appointment for therapy services.  You may also go Monday through Friday, from 9 am to 1 pm. Contact information: [address] [place]  [address] [place] [address] [phone]                 Follow-up recommendations:   Activity: as tolerated  Diet: heart healthy  Other: -Follow-up with your outpatient psychiatric provider -instructions on appointment date, time, and address (location) are provided to you in discharge paperwork.  -Take your psychiatric medications as prescribed at discharge - instructions are provided to you in the discharge paperwork  -Follow-up with outpatient primary care doctor and other specialists -for management of chronic medical disease,  including: n/a  -Testing: Follow-up with outpatient provider for abnormal lab results: n/a  -Recommend abstinence from alcohol, tobacco, and other illicit drug use at discharge.   -If your psychiatric symptoms recur, worsen, or if you have side effects to your psychiatric medications, call your outpatient psychiatric provider, 911, 988 or go to the nearest emergency department.  -If suicidal thoughts recur, call your outpatient psychiatric provider, 911, 988 or go to the nearest emergency department.   Signed: Justino Cornish, MD 07/02/2024, 10:24 AM

## 2024-07-04 ENCOUNTER — Ambulatory Visit (INDEPENDENT_AMBULATORY_CARE_PROVIDER_SITE_OTHER): Payer: MEDICAID | Admitting: Mental Health

## 2024-07-04 DIAGNOSIS — F250 Schizoaffective disorder, bipolar type: Secondary | ICD-10-CM | POA: Diagnosis not present
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
## 2024-07-08 ENCOUNTER — Encounter (HOSPITAL_COMMUNITY): Payer: MEDICAID | Admitting: Psychiatry

## 2024-07-08 ENCOUNTER — Ambulatory Visit (INDEPENDENT_AMBULATORY_CARE_PROVIDER_SITE_OTHER): Payer: MEDICAID | Admitting: Psychiatry

## 2024-07-08 ENCOUNTER — Encounter (HOSPITAL_COMMUNITY): Payer: Self-pay | Admitting: Psychiatry

## 2024-07-08 VITALS — BP 118/95 | HR 91 | Temp 97.8°F | Ht 74.0 in | Wt 226.4 lb

## 2024-07-08 DIAGNOSIS — F200 Paranoid schizophrenia: Secondary | ICD-10-CM

## 2024-07-08 DIAGNOSIS — T50905A Adverse effect of unspecified drugs, medicaments and biological substances, initial encounter: Secondary | ICD-10-CM

## 2024-07-08 DIAGNOSIS — G2571 Drug induced akathisia: Secondary | ICD-10-CM

## 2024-07-08 DIAGNOSIS — F411 Generalized anxiety disorder: Secondary | ICD-10-CM

## 2024-07-08 MED ORDER — HYDROXYZINE HCL 50 MG PO TABS: 50.0000 mg | ORAL_TABLET | Freq: Three times a day (TID) | ORAL | 3 refills | Status: AC | PRN

## 2024-07-08 MED ORDER — PROPRANOLOL HCL 20 MG PO TABS: 20.0000 mg | ORAL_TABLET | Freq: Two times a day (BID) | ORAL | 3 refills | Status: AC

## 2024-07-08 MED ORDER — HALOPERIDOL DECANOATE 100 MG/ML IM SOLN: 200.0000 mg | INTRAMUSCULAR | 11 refills | Status: AC

## 2024-07-08 MED ORDER — TRAZODONE HCL 50 MG PO TABS: 50.0000 mg | ORAL_TABLET | Freq: Every evening | ORAL | 3 refills | Status: AC | PRN

## 2024-07-08 MED ORDER — BENZTROPINE MESYLATE 0.5 MG PO TABS: 0.5000 mg | ORAL_TABLET | Freq: Two times a day (BID) | ORAL | 3 refills | Status: AC

## 2024-07-08 NOTE — Progress Notes (Signed)
 Psychiatric Initial Adult Assessment   Patient Identification: William Romero MRN:  [id number] Date of Evaluation:  07/08/2024 Referral Source: [facility] Chief Complaint:   I feel manic on the inside Visit Diagnosis:    ICD-10-CM   1. Paranoid schizophrenia (HCC)  F20.0 benztropine  (COGENTIN ) 0.5 MG tablet    haloperidol  decanoate (HALDOL  DECANOATE) 100 MG/ML injection    2. GAD (generalized anxiety disorder)  F41.1 propranolol  (INDERAL ) 20 MG tablet    hydrOXYzine  (ATARAX ) 50 MG tablet    traZODone  (DESYREL ) 50 MG tablet    3. Acute medication-induced akathisia  G25.71 propranolol  (INDERAL ) 20 MG tablet   T50.905A benztropine  (COGENTIN ) 0.5 MG tablet      History of Present Illness:  43 year old male seen to day for initial psychaitirc evaluation. He was referred to outpatient psychiatry by [facility] on 06/23/2024-07/02/2024 for worsning psychosis. He has a pyschiatric history of  schizophrenia, reported ADHD, GAD and episodes of depression. Currently he is managed on Haldol  deconate 100 mg (next dose due 11/24)2025), cogentin  0.5 mg twice daily, hydroxyzine  50 mg three times daily as needed, and trazodone  50 mg nightly.  He reports his medications are somewhat effective in managing his psychiatric conditions.  Today patient is well-groomed, pleasant, cooperative, and engaged in conversation.  Patient restless throughout the exam.  He informed writer that he is unable to stop pacing.  Patient also describes experiencing inner restlessness.  He informed clinical research associate that he feels manic on the inside.  He does note that he is irritable, distracted, have racing thoughts, and fluctuations in mood.  Patient notes that his psychosis has subsided.  He notes that he continues to hear things but is uncertain what they are saying.  He denies visual hallucinations.  He does note that he feels paranoid.  Since his hospitalization he informed writer that he continues to have increased anxiety and depression.   Patient notes that he is uncertain why he is anxious.  Today provider conducted a GAD-7 and patient scored a 21.  Provider also conducted PHQ-9 the patient scored a 20.  He notes that he sleeps approximately 6 hours.  He reports that his appetite is adequate.  Today he denies SI/HI.  Patient denies alcohol or illegal drug use.  He does note that he vapes tobacco.  Today provider conducted an aims assessment and patient scored a 3.  Patient has not been taking oral medication but today is agreeable to restarting Cogentin , trazodone , and hydroxyzine .  He is also agreeable to starting propranolol  20 mg twice daily to help manage akathisia and anxiety Potential side effects of medication and risks vs benefits of treatment vs non-treatment were explained and discussed. All questions were answered.  No other concerns noted at this time.   Associated Signs/Symptoms: Depression Symptoms:  depressed mood, anhedonia, psychomotor agitation, feelings of worthlessness/guilt, difficulty concentrating, hopelessness, recurrent thoughts of death, anxiety, loss of energy/fatigue, (Hypo) Manic Symptoms:  Distractibility, Elevated Mood, Flight of Ideas, Hallucinations, Irritable Mood, Anxiety Symptoms:  Excessive Worry, Psychotic Symptoms:  Hallucinations: Auditory Paranoia, PTSD Symptoms: NA  Past Psychiatric History: multiple inpatient hospitalizations at [facility] 6/11-6/19/2024, 6/22-03/06/2023, 1/25-10/12/2023, 5/10-5/20/2021. Most recent stay was 06/23/2024-07/02/2024  Previous Psychotropic Medications: Prolixin , Cogentin , Haldol , trazodone , hydroxyzine , Lexapro , mirtazapine , Ativan , Depakote , Vyvanse, Risperdal , Adderall, Cymbalta , gabapentin , Zyprexa , Nicorette  gum propranolol   Substance Abuse History in the last 12 months:  No.  Consequences of Substance Abuse: NA  Past Medical History:  Past Medical History:  Diagnosis Date   Paranoid schizophrenia (HCC) 10/03/2023  Schizophrenia [facility])      Past Surgical History:  Procedure Laterality Date   APPENDECTOMY      Family Psychiatric History: Patient reports that his grandmother is mentally ill   Family History: No family history on file.  Social History:   Social History   Socioeconomic History   Marital status: Single    Spouse name: Not on file   Number of children: Not on file   Years of education: Not on file   Highest education level: Not on file  Occupational History   Not on file  Tobacco Use   Smoking status: Former    Current packs/day: 0.50    Types: Cigarettes   Smokeless tobacco: Never  Vaping Use   Vaping status: Never Used  Substance and Sexual Activity   Alcohol use: Not Currently    Alcohol/week: 5.0 standard drinks of alcohol    Types: 5 Cans of beer per week   Drug use: No   Sexual activity: Not Currently  Other Topics Concern   Not on file  Social History Narrative   Reports new onset CAH at 43 years old, denies prior hx   Social Drivers of Corporate Investment Banker Strain: Low Risk  (07/04/2024)   Overall Financial Resource Strain (CARDIA)    Difficulty of Paying Living Expenses: Not hard at all  Food Insecurity: No Food Insecurity (06/23/2024)   Hunger Vital Sign    Worried About Running Out of Food in the Last Year: Never true    Ran Out of Food in the Last Year: Never true  Transportation Needs: No Transportation Needs (06/23/2024)   PRAPARE - Administrator, Civil Service (Medical): No    Lack of Transportation (Non-Medical): No  Physical Activity: Insufficiently Active (07/04/2024)   Exercise Vital Sign    Days of Exercise per Week: 7 days    Minutes of Exercise per Session: 20 min  Stress: No Stress Concern Present (07/04/2024)   [employer] of Occupational Health - Occupational Stress Questionnaire    Feeling of Stress: Not at all  Social Connections: Moderately Isolated (07/04/2024)   Social Connection and Isolation Panel    Frequency of  Communication with Friends and Family: More than three times a week    Frequency of Social Gatherings with Friends and Family: Never    Attends Religious Services: 1 to 4 times per year    Active Member of [employer] or Organizations: No    Attends Banker Meetings: Never    Marital Status: Never married    Additional Social History: Patient resides in [place].  He is single and has no children.  Currently is unemployed.  He denies alcohol or illegal drug use.  Patient does note that he vapes tobacco  Allergies:  No Known Allergies  Metabolic Disorder Labs: Lab Results  Component Value Date   HGBA1C 4.8 06/23/2024   MPG 91.06 06/23/2024   MPG 103 01/15/2024   Lab Results  Component Value Date   PROLACTIN 15.6 06/23/2024   Lab Results  Component Value Date   CHOL 180 06/23/2024   TRIG 102 06/23/2024   HDL 41 06/23/2024   CHOLHDL 4.4 06/23/2024   VLDL 20 06/23/2024   LDLCALC 119 (H) 06/23/2024   LDLCALC 92 01/15/2024   Lab Results  Component Value Date   TSH 0.968 06/23/2024    Therapeutic Level Labs: No results found for: LITHIUM No results found for: CBMZ Lab Results  Component Value Date  VALPROATE 83 01/23/2024    Current Medications: Current Outpatient Medications  Medication Sig Dispense Refill   propranolol  (INDERAL ) 20 MG tablet Take 1 tablet (20 mg total) by mouth 2 (two) times daily. 60 tablet 3   benztropine  (COGENTIN ) 0.5 MG tablet Take 1 tablet (0.5 mg total) by mouth 2 (two) times daily. 60 tablet 3   [START ON 07/29/2024] haloperidol  decanoate (HALDOL  DECANOATE) 100 MG/ML injection Inject 2 mLs (200 mg total) into the muscle every 28 (twenty-eight) days. 1 mL 11   hydrOXYzine  (ATARAX ) 50 MG tablet Take 1 tablet (50 mg total) by mouth 3 (three) times daily as needed for anxiety. 90 tablet 3   traZODone  (DESYREL ) 50 MG tablet Take 1 tablet (50 mg total) by mouth at bedtime as needed for sleep. 30 tablet 3   No current  facility-administered medications for this visit.    Musculoskeletal: Strength & Muscle Tone: within normal limits Gait & Station: normal Patient leans: N/A  Psychiatric Specialty Exam: Review of Systems  Blood pressure (!) 118/95, pulse 91, temperature 97.8 F (36.6 C), height 6' 2 (1.88 m), weight 226 lb 6.4 oz (102.7 kg), SpO2 97%.Body mass index is 29.07 kg/m.  General Appearance: Well Groomed  Eye Contact:  Good  Speech:  Clear and Coherent and Normal Rate  Volume:  Normal  Mood:  Anxious and Depressed  Affect:  Appropriate and Congruent  Thought Process:  Coherent, Goal Directed, and Linear  Orientation:  Full (Time, Place, and Person)  Thought Content:  Logical, Hallucinations: Auditory, and Paranoid Ideation  Suicidal Thoughts:  No  Homicidal Thoughts:  No  Memory:  Immediate;   Good Recent;   Good Remote;   Good  Judgement:  Good  Insight:  Good  Psychomotor Activity:  Restlessness  Concentration:  Concentration: Good and Attention Span: Good  Recall:  Good  Fund of Knowledge:Good  Language: Good  Akathisia:  No  Handed:  Right  AIMS (if indicated):  not done  Assets:  Communication Skills Desire for Improvement Financial Resources/Insurance Housing Leisure Time Physical Health Social Support  ADL's:  Intact  Cognition: WNL  Sleep:  Fair   Screenings: AIMS    Flowsheet Row Office Visit from 07/08/2024 in [facility]) [facility]) [facility]) [facility]) [facility]  AIMS Total Score 3 0 0 0 0   AUDIT    Flowsheet Row Ad[facility] ([facility]charged) [facility] (Discharged) from  [facility] ([facility] from 10[facility] (Discharged) from [facility] ([facility]  Alcohol Use Disorder Identification Test Final Score (AUDIT) 0 0 2 0 2   GAD-7    Flowsheet Row Office Visit from 07/08/2024 in [facility] Counselor from 07/04/2024 in [facility] Office Visit from 05/10/2024 in [facility] Clinical Support from 04/29/2024 in [facility] Clinical Support from 11/14/2023 in [facility]  Total GAD-7 Score 21 1 16 14 17    PHQ2-9    Flowsheet Row Office Visit from 07/08/2024 in [facility] Counselor from 07/04/2024 in [facility] Office Visit from 05/10/2024 in [facility] Clinical Support from 04/29/2024 in [facility] Clinical Support from 11/14/2023 in [facility]  PHQ-2 Total Score 5 0 0 4 0  PHQ-9 Total Score 20 0 10 12 --   Flowsheet Row Counselor from 07/04/2024 in [facility] Most recent reading at 07/04/2024  8:35 AM Admission (Discharged) [facility] Most recent reading at 06/25/2024  7:55 PM ED from 06/23/2024 in [facility] Most recent reading at 06/23/2024 11:08 AM  C-SSRS RISK CATEGORY No Risk No Risk No Risk    Assessment and Plan: Patient endorses increased anxiety, depression, and symptoms of akathisia.  Patient has not been taking oral medication but today is agreeable to restarting Cogentin , trazodone , and hydroxyzine .  He is also  agreeable to starting propranolol  20 mg twice daily to help manage akathisia and anxiety.  1. Paranoid schizophrenia (HCC) (Primary)  Restart/continue- benztropine  (COGENTIN ) 0.5 MG tablet; Take 1 tablet (0.5 mg total) by mouth 2 (two) times daily.  Dispense: 60 tablet; Refill: 3 Continue- haloperidol  decanoate (HALDOL  DECANOATE) 100 MG/ML injection; Inject 2 mLs (200 mg total) into the muscle every 28 (twenty-eight) days.  Dispense: 1 mL; Refill: 11  2. GAD (generalized anxiety disorder)  Start- propranolol  (INDERAL ) 20 MG tablet; Take 1 tablet (20 mg total) by mouth 2 (two) times daily.  Dispense: 60 tablet; Refill: 3 Restart/continue- hydrOXYzine  (ATARAX ) 50 MG tablet; Take 1 tablet (50 mg total) by mouth 3 (three) times daily as needed for anxiety.  Dispense: 90 tablet; Refill: 3 Restart/continue- traZODone  (DESYREL ) 50 MG tablet; Take 1 tablet (50 mg total) by mouth at bedtime as needed for sleep.  Dispense: 30 tablet; Refill: 3  3. Acute medication-induced akathisia  Start- propranolol  (INDERAL ) 20 MG tablet; Take 1 tablet (20 mg total) by mouth 2 (two) times daily.  Dispense: 60 tablet; Refill: 3 Continue- benztropine  (COGENTIN ) 0.5 MG tablet; Take 1 tablet (0.5 mg total) by mouth 2 (two) times daily.  Dispense: 60 tablet; Refill: 3    Collaboration of Care: Other provider involved in patient's care AEB primary psychiatric provider and counselor  Patient/Guardian was advised Release of Information must be obtained prior to any record release in order to collaborate their care with an outside provider. Patient/Guardian was advised if they have not already done so to contact the registration department to sign all necessary forms in order for us  to release information regarding their care.   Consent: Patient/Guardian gives verbal consent for treatment and assignment of benefits for services provided during this visit. Patient/Guardian expressed understanding and agreed to proceed.    Follow-up in 2 months Follow-up with shock clinic in 1 month Follow-up with therapy Zane FORBES Bach, NP 11/3/20259:00 AM

## 2024-07-31 ENCOUNTER — Ambulatory Visit (HOSPITAL_COMMUNITY): Payer: MEDICAID

## 2024-08-05 ENCOUNTER — Telehealth (HOSPITAL_COMMUNITY): Payer: Self-pay

## 2024-08-05 NOTE — Telephone Encounter (Signed)
 Patient called in requesting lexapro . Per chart, the patient was last prescribed by provider on 8/25. Will send to provider for consideration.

## 2024-08-08 ENCOUNTER — Ambulatory Visit (HOSPITAL_COMMUNITY): Payer: MEDICAID

## 2024-08-11 NOTE — Telephone Encounter (Signed)
 Patient was last seen at this facility on 07/08/2024 by Dr. Harl where he was being managed on the following psychiatric medications:  Benztropine  0.5 mg 2 times daily Hydroxyzine  50 mg 3 times daily as needed Propranolol  20 mg 2 times daily Trazodone  50 mg at bedtime as needed Haldol  decanoate 200 mg injection every 28 days  Patient's current medication regimen reflects his most recent hospitalization which occurred from 06/23/2024 to 07/02/2024.  Patient was never placed on Lexapro  following his discharge from [facility].  Patient has an appointment scheduled with this provider on 09/03/2024.  Provider to discuss potentially adding Lexapro  to his medication regimen if he displays symptoms of depression or anxiety.

## 2024-08-20 ENCOUNTER — Ambulatory Visit (HOSPITAL_COMMUNITY): Payer: MEDICAID

## 2024-09-03 ENCOUNTER — Encounter (HOSPITAL_COMMUNITY): Payer: MEDICAID | Admitting: Physician Assistant

## 2024-09-17 ENCOUNTER — Telehealth (HOSPITAL_COMMUNITY): Payer: Self-pay

## 2024-09-17 ENCOUNTER — Encounter (HOSPITAL_COMMUNITY): Payer: Self-pay

## 2024-09-17 NOTE — Telephone Encounter (Signed)
 Called pt to get injection & office visit rescheduled. Pt did not answer. I left a voicemail & sent MyChart message asking patient to call us  back to reschedule ASAP. Thanks!
